# Patient Record
Sex: Male | Born: 1935 | Race: White | Hispanic: No | Smoking: Former smoker
Health system: Southern US, Community
[De-identification: ages and names within clinical notes are randomized; demographics above are authoritative.]

## PROBLEM LIST (undated history)

## (undated) DIAGNOSIS — J40 Bronchitis, not specified as acute or chronic: Secondary | ICD-10-CM

## (undated) DIAGNOSIS — C4490 Unspecified malignant neoplasm of skin, unspecified: Secondary | ICD-10-CM

## (undated) DIAGNOSIS — R351 Nocturia: Secondary | ICD-10-CM

## (undated) DIAGNOSIS — R972 Elevated prostate specific antigen [PSA]: Secondary | ICD-10-CM

## (undated) DIAGNOSIS — J8410 Pulmonary fibrosis, unspecified: Secondary | ICD-10-CM

## (undated) DIAGNOSIS — J45909 Unspecified asthma, uncomplicated: Secondary | ICD-10-CM

## (undated) DIAGNOSIS — J439 Emphysema, unspecified: Secondary | ICD-10-CM

## (undated) DIAGNOSIS — N400 Enlarged prostate without lower urinary tract symptoms: Secondary | ICD-10-CM

## (undated) DIAGNOSIS — I82409 Acute embolism and thrombosis of unspecified deep veins of unspecified lower extremity: Secondary | ICD-10-CM

## (undated) DIAGNOSIS — J449 Chronic obstructive pulmonary disease, unspecified: Secondary | ICD-10-CM

## (undated) DIAGNOSIS — C801 Malignant (primary) neoplasm, unspecified: Secondary | ICD-10-CM

## (undated) DIAGNOSIS — E785 Hyperlipidemia, unspecified: Secondary | ICD-10-CM

## (undated) HISTORY — DX: Elevated prostate specific antigen (PSA): R97.20

## (undated) HISTORY — PX: TONSILLECTOMY: SUR1361

## (undated) HISTORY — DX: Nocturia: R35.1

## (undated) HISTORY — DX: Benign prostatic hyperplasia without lower urinary tract symptoms: N40.0

## (undated) HISTORY — DX: Hyperlipidemia, unspecified: E78.5

## (undated) HISTORY — PX: HERNIA REPAIR: SHX51

## (undated) HISTORY — DX: Pulmonary fibrosis, unspecified: J84.10

## (undated) HISTORY — DX: Unspecified malignant neoplasm of skin, unspecified: C44.90

## (undated) HISTORY — DX: Chronic obstructive pulmonary disease, unspecified: J44.9

## (undated) HISTORY — PX: CATARACT EXTRACTION: SUR2

---

## 2004-09-02 ENCOUNTER — Emergency Department (HOSPITAL_COMMUNITY): Admission: EM | Admit: 2004-09-02 | Discharge: 2004-09-02 | Payer: Self-pay | Admitting: Emergency Medicine

## 2005-11-18 ENCOUNTER — Ambulatory Visit: Payer: Self-pay | Admitting: Internal Medicine

## 2007-11-22 ENCOUNTER — Emergency Department: Payer: Self-pay | Admitting: Emergency Medicine

## 2008-06-11 ENCOUNTER — Encounter: Payer: Self-pay | Admitting: Unknown Physician Specialty

## 2008-06-18 ENCOUNTER — Encounter: Payer: Self-pay | Admitting: Unknown Physician Specialty

## 2008-07-19 ENCOUNTER — Encounter: Payer: Self-pay | Admitting: Unknown Physician Specialty

## 2010-02-04 ENCOUNTER — Ambulatory Visit: Payer: Self-pay | Admitting: Internal Medicine

## 2010-07-27 ENCOUNTER — Inpatient Hospital Stay: Payer: Self-pay | Admitting: Internal Medicine

## 2011-06-24 ENCOUNTER — Ambulatory Visit: Payer: Self-pay

## 2012-04-12 ENCOUNTER — Ambulatory Visit: Payer: Self-pay | Admitting: Vascular Surgery

## 2012-04-12 LAB — BASIC METABOLIC PANEL
BUN: 15 mg/dL (ref 7–18)
Calcium, Total: 9.8 mg/dL (ref 8.5–10.1)
Chloride: 103 mmol/L (ref 98–107)
Co2: 29 mmol/L (ref 21–32)
Creatinine: 1.42 mg/dL — ABNORMAL HIGH (ref 0.60–1.30)
EGFR (Non-African Amer.): 45 — ABNORMAL LOW
Potassium: 4 mmol/L (ref 3.5–5.1)

## 2012-06-18 ENCOUNTER — Encounter: Payer: Self-pay | Admitting: Neurology

## 2012-07-19 ENCOUNTER — Encounter: Payer: Self-pay | Admitting: Neurology

## 2012-08-19 ENCOUNTER — Encounter: Payer: Self-pay | Admitting: Neurology

## 2012-09-18 ENCOUNTER — Encounter: Payer: Self-pay | Admitting: Neurology

## 2012-09-20 ENCOUNTER — Other Ambulatory Visit: Payer: Self-pay | Admitting: Neurology

## 2012-09-20 DIAGNOSIS — M531 Cervicobrachial syndrome: Secondary | ICD-10-CM

## 2012-09-20 DIAGNOSIS — R42 Dizziness and giddiness: Secondary | ICD-10-CM

## 2012-09-26 ENCOUNTER — Ambulatory Visit
Admission: RE | Admit: 2012-09-26 | Discharge: 2012-09-26 | Disposition: A | Discharge status: Home / Self Care | Payer: Medicare Other | Source: Ambulatory Visit | Attending: Neurology | Admitting: Neurology

## 2012-09-26 DIAGNOSIS — R42 Dizziness and giddiness: Secondary | ICD-10-CM

## 2012-09-26 DIAGNOSIS — M531 Cervicobrachial syndrome: Secondary | ICD-10-CM

## 2012-09-26 MED ORDER — GADOBENATE DIMEGLUMINE 529 MG/ML IV SOLN
18.0000 mL | Freq: Once | INTRAVENOUS | Status: AC | PRN
  Administered 2012-09-26: 18 mL via INTRAVENOUS

## 2012-12-12 ENCOUNTER — Emergency Department: Payer: Self-pay | Admitting: Emergency Medicine

## 2012-12-12 LAB — CBC WITH DIFFERENTIAL/PLATELET
Basophil #: 0.2 10*3/uL — ABNORMAL HIGH (ref 0.0–0.1)
Eosinophil #: 0.2 10*3/uL (ref 0.0–0.7)
HCT: 41.8 % (ref 40.0–52.0)
HGB: 13.3 g/dL (ref 13.0–18.0)
Lymphocyte %: 25.4 %
MCH: 27.5 pg (ref 26.0–34.0)
MCV: 86 fL (ref 80–100)
Neutrophil #: 7 10*3/uL — ABNORMAL HIGH (ref 1.4–6.5)
Neutrophil %: 61.5 %
RBC: 4.85 10*6/uL (ref 4.40–5.90)
WBC: 11.3 10*3/uL — ABNORMAL HIGH (ref 3.8–10.6)

## 2012-12-12 LAB — HEPATIC FUNCTION PANEL A (ARMC)
Albumin: 3.8 g/dL (ref 3.4–5.0)
Alkaline Phosphatase: 94 U/L (ref 50–136)
Bilirubin,Total: 0.5 mg/dL (ref 0.2–1.0)
SGOT(AST): 20 U/L (ref 15–37)
SGPT (ALT): 25 U/L (ref 12–78)
Total Protein: 7.8 g/dL (ref 6.4–8.2)

## 2012-12-12 LAB — RAPID INFLUENZA A&B ANTIGENS

## 2012-12-12 LAB — BASIC METABOLIC PANEL
EGFR (Non-African Amer.): 52 — ABNORMAL LOW
Glucose: 112 mg/dL — ABNORMAL HIGH (ref 65–99)
Potassium: 4 mmol/L (ref 3.5–5.1)
Sodium: 138 mmol/L (ref 136–145)

## 2013-08-13 ENCOUNTER — Ambulatory Visit: Payer: Self-pay | Admitting: Diagnostic Neuroimaging

## 2013-08-15 ENCOUNTER — Ambulatory Visit: Payer: Self-pay | Admitting: Internal Medicine

## 2013-12-31 ENCOUNTER — Ambulatory Visit: Payer: Self-pay | Admitting: Specialist

## 2014-06-11 DIAGNOSIS — R51 Headache: Secondary | ICD-10-CM

## 2014-06-11 DIAGNOSIS — J441 Chronic obstructive pulmonary disease with (acute) exacerbation: Secondary | ICD-10-CM | POA: Insufficient documentation

## 2014-06-11 DIAGNOSIS — I10 Essential (primary) hypertension: Secondary | ICD-10-CM | POA: Insufficient documentation

## 2014-06-11 DIAGNOSIS — J449 Chronic obstructive pulmonary disease, unspecified: Secondary | ICD-10-CM | POA: Insufficient documentation

## 2014-06-11 DIAGNOSIS — R519 Headache, unspecified: Secondary | ICD-10-CM | POA: Insufficient documentation

## 2014-12-04 DIAGNOSIS — I739 Peripheral vascular disease, unspecified: Secondary | ICD-10-CM

## 2014-12-04 DIAGNOSIS — I779 Disorder of arteries and arterioles, unspecified: Secondary | ICD-10-CM | POA: Insufficient documentation

## 2014-12-04 DIAGNOSIS — R413 Other amnesia: Secondary | ICD-10-CM | POA: Insufficient documentation

## 2014-12-04 DIAGNOSIS — E7800 Pure hypercholesterolemia, unspecified: Secondary | ICD-10-CM | POA: Insufficient documentation

## 2014-12-04 DIAGNOSIS — Z8709 Personal history of other diseases of the respiratory system: Secondary | ICD-10-CM | POA: Insufficient documentation

## 2014-12-04 DIAGNOSIS — Z8711 Personal history of peptic ulcer disease: Secondary | ICD-10-CM | POA: Insufficient documentation

## 2014-12-04 DIAGNOSIS — I200 Unstable angina: Secondary | ICD-10-CM | POA: Insufficient documentation

## 2014-12-04 DIAGNOSIS — K219 Gastro-esophageal reflux disease without esophagitis: Secondary | ICD-10-CM | POA: Insufficient documentation

## 2014-12-17 DIAGNOSIS — J45909 Unspecified asthma, uncomplicated: Secondary | ICD-10-CM | POA: Insufficient documentation

## 2015-02-19 ENCOUNTER — Ambulatory Visit: Payer: Self-pay | Admitting: Internal Medicine

## 2015-03-19 ENCOUNTER — Ambulatory Visit: Admit: 2015-03-19 | Disposition: A | Discharge status: Home / Self Care | Payer: Self-pay | Admitting: Internal Medicine

## 2015-04-12 NOTE — Op Note (Signed)
PATIENT NAME:  Wesley Newton, Wesley Newton MR#:  [id number] DATE OF BIRTH:  1927-06-13  DATE OF PROCEDURE:  04/12/2012  PREOPERATIVE DIAGNOSES:  1. Peripheral arterial disease with claudication, right lower extremity.  2. Hyperlipidemia.  3. Venous insufficiency.   POSTOPERATIVE DIAGNOSES:    1. Peripheral arterial disease with claudication, right lower extremity.  2. Hyperlipidemia.  3. Venous insufficiency.   PROCEDURES:    1. Catheter placement into right popliteal artery from left femoral approach.  2. Aortogram with selective right lower extremity angiogram.  3. Percutaneous transluminal angioplasty of right superficial femoral artery with 6 mm diameter angioplasty balloon.   SURGEON: Algernon Huxley, M.D.   ANESTHESIA: Local with moderate conscious sedation.   ESTIMATED BLOOD LOSS: 25 mL. FLUOROSCOPY TIME: 2.9 minutes.  CONTRAST USED: 50 mL.   INDICATION FOR PROCEDURE: This is an 81 year old white male with lifestyle limiting claudication of the right lower extremity. He had a noninvasive study that suggested SFA stenosis and moderately reduced ABIs. He desired intervention for lifestyle limitations. The risks and benefits were discussed. Informed consent was obtained.   DESCRIPTION OF PROCEDURE: The patient was brought to the vascular interventional radiology suite. Groins were shaved and prepped and a sterile surgical field was created. The left femoral head was localized with fluoroscopy. The left femoral artery was accessed without difficulty with a Seldinger needle. Three J-wire and 5 French sheath were placed. Pigtail catheter was placed at the L1 level and AP aortogram was performed which showed normal flow within the renal vessels bilaterally which were single. His aorta was densely calcified, but not heavily stenotic and the iliacs were patent without significant stenosis. I then hooked the aortic bifurcation and advanced to the right femoral head. Selective right lower extremity  angiogram was then performed. This demonstrated normal common femoral and profunda femoris artery. The superficial femoral artery and the mid to distal segment had a near occlusive stenosis. Below this, the popliteal artery was patent and there was two-vessel runoff distally with stenosis of the posterior tibial artery at the level of the foot. The patient was given 3,500 units of intravenous heparin with systemic anticoagulation. A 6 French Ansel sheath was placed over a Terumo advantage wire. I crossed the lesion without difficulty with a Terumo advantage wire and a Kumpe catheter and confirmed intraluminal flow in the popliteal artery. I then replaced the wire and performed percutaneous transluminal angioplasty of the stenotic area with a 6 mm diameter x 6 cm length balloon. A waste was taken which resolved. Completion angiogram following this showed this to be widely patent without evidence of residual stenosis. I elected to terminate the procedure. The sheath was pulled back to the ipsilateral external iliac artery and oblique arteriogram was performed. StarClose closure device was deployed in the usual fashion with excellent hemostatic result. The patient tolerated the procedure well and was taken to the recovery room in stable condition.   ____________________________ Algernon Huxley, MD jsd:ap D: 04/12/2012 09:00:55 ET T: 04/12/2012 11:53:21 ET JOB#: [id number]  cc: Algernon Huxley, MD, <Dictator> John B. Sarina Ser, MD Algernon Huxley MD ELECTRONICALLY SIGNED 04/16/2012 16:37

## 2015-08-02 ENCOUNTER — Emergency Department
Admission: EM | Admit: 2015-08-02 | Discharge: 2015-08-02 | Disposition: A | Discharge status: Home / Self Care | Payer: Medicare Other | Attending: Emergency Medicine | Admitting: Emergency Medicine

## 2015-08-02 ENCOUNTER — Encounter: Payer: Self-pay | Admitting: Emergency Medicine

## 2015-08-02 DIAGNOSIS — R42 Dizziness and giddiness: Secondary | ICD-10-CM | POA: Insufficient documentation

## 2015-08-02 DIAGNOSIS — T404X5A Adverse effect of other synthetic narcotics, initial encounter: Secondary | ICD-10-CM | POA: Insufficient documentation

## 2015-08-02 DIAGNOSIS — T50905A Adverse effect of unspecified drugs, medicaments and biological substances, initial encounter: Secondary | ICD-10-CM

## 2015-08-02 DIAGNOSIS — Z87891 Personal history of nicotine dependence: Secondary | ICD-10-CM | POA: Insufficient documentation

## 2015-08-02 DIAGNOSIS — T360X5A Adverse effect of penicillins, initial encounter: Secondary | ICD-10-CM | POA: Insufficient documentation

## 2015-08-02 DIAGNOSIS — R112 Nausea with vomiting, unspecified: Secondary | ICD-10-CM | POA: Insufficient documentation

## 2015-08-02 HISTORY — DX: Malignant (primary) neoplasm, unspecified: C80.1

## 2015-08-02 HISTORY — DX: Acute embolism and thrombosis of unspecified deep veins of unspecified lower extremity: I82.409

## 2015-08-02 LAB — COMPREHENSIVE METABOLIC PANEL
ALBUMIN: 4.1 g/dL (ref 3.5–5.0)
ALT: 16 U/L — AB (ref 17–63)
ANION GAP: 9 (ref 5–15)
AST: 25 U/L (ref 15–41)
Alkaline Phosphatase: 65 U/L (ref 38–126)
BUN: 15 mg/dL (ref 6–20)
CALCIUM: 9.1 mg/dL (ref 8.9–10.3)
CHLORIDE: 103 mmol/L (ref 101–111)
CO2: 25 mmol/L (ref 22–32)
Creatinine, Ser: 1.13 mg/dL (ref 0.61–1.24)
GFR calc non Af Amer: 57 mL/min — ABNORMAL LOW (ref >60)
Glucose, Bld: 139 mg/dL — ABNORMAL HIGH (ref 65–99)
POTASSIUM: 4.1 mmol/L (ref 3.5–5.1)
Sodium: 137 mmol/L (ref 135–145)
Total Bilirubin: 0.7 mg/dL (ref 0.3–1.2)
Total Protein: 7.6 g/dL (ref 6.5–8.1)

## 2015-08-02 LAB — CBC
HEMATOCRIT: 43.9 % (ref 40.0–52.0)
HEMOGLOBIN: 14.1 g/dL (ref 13.0–18.0)
MCH: 27.7 pg (ref 26.0–34.0)
MCHC: 32.1 g/dL (ref 32.0–36.0)
MCV: 86.1 fL (ref 80.0–100.0)
PLATELETS: 263 10*3/uL (ref 150–440)
RBC: 5.1 MIL/uL (ref 4.40–5.90)
RDW: 14.6 % — ABNORMAL HIGH (ref 11.5–14.5)
WBC: 10.2 10*3/uL (ref 3.8–10.6)

## 2015-08-02 LAB — TROPONIN I

## 2015-08-02 MED ORDER — SODIUM CHLORIDE 0.9 % IV BOLUS (SEPSIS)
500.0000 mL | Freq: Once | INTRAVENOUS | Status: AC
  Administered 2015-08-02: 500 mL via INTRAVENOUS

## 2015-08-02 MED ORDER — ONDANSETRON HCL 4 MG/2ML IJ SOLN
4.0000 mg | Freq: Once | INTRAMUSCULAR | Status: AC | PRN
  Administered 2015-08-02: 4 mg via INTRAVENOUS

## 2015-08-02 MED ORDER — ONDANSETRON HCL 4 MG PO TABS: 4.0000 mg | ORAL_TABLET | Freq: Three times a day (TID) | ORAL | Status: DC | PRN

## 2015-08-02 MED ORDER — ONDANSETRON HCL 4 MG/2ML IJ SOLN
INTRAMUSCULAR | Status: AC
  Filled 2015-08-02: qty 2

## 2015-08-02 NOTE — Discharge Instructions (Signed)
Drug Toxicity You are having a reaction to your medicine. This does not mean you are allergic to the drug. Medicines can have many different side effects and toxic reactions. These include:  Stomach symptoms, such as nausea, vomiting, cramps, diarrhea, bloating, constipation, and dry mouth.  Nervous system symptoms, such as weakness, muscle spasms, drowsiness, confusion, agitation, and headache.  Heart and blood vessel symptoms, such as fainting, irregular heartbeat (palpitations), and fast heartbeat.  Skin symptoms, such as itching, light sensitivity, and rashes. When taking more medicines, there is an increased chance of a drug interaction that can make you sick. Take your medicines as your caregiver recommends. Keep a list of the names and doses of each of your drugs. Avoid alcohol and street drugs. Call your caregiver if you are worried about drug side effects or toxicity. Document Released: 12/05/2005 Document Revised: 02/27/2012 Document Reviewed: 05/22/2007 [facility] Patient Information 2015 [place], [place]. This information is not intended to replace advice given to you by your health care provider. Make sure you discuss any questions you have with your health care provider.  Please return immediately if condition worsens. Please contact her primary physician or the physician you were given for referral. If you have any specialist physicians involved in her treatment and plan please also contact them. Thank you for using [facility].

## 2015-08-02 NOTE — ED Provider Notes (Signed)
Time Seen: Approximately 1443  I have reviewed the triage notes  Chief Complaint: Dizziness and Emesis   History of Present Illness: Wesley Newton is a 81 y.o. male who presents with feelings of "dizziness". Further investigation shows it is felt somewhat lightheaded after taking tramadol and amoxicillin. States he was prescribed these medications for some dental pain. Patient states in the history and discussion with his family does not tolerate narcotic medication well. She felt fine prior to taking the tramadol and then developed some nausea vomiting and feelings of lightheadedness. He denies any actual fall. He denies any headaches or any focal neurologic deficits. He denies any hematemesis or biliary emesis. He describes some mild generalized weakness but nonfocal. He denies any obvious vertiginous symptoms. He denies any chest pain or shortness of breath to this historian but states that he " aches all over." He denies any shortness of breath or fever.   Past Medical History  Diagnosis Date  . Cancer   . DVT of leg (deep venous thrombosis)     There are no active problems to display for this patient.   History reviewed. No pertinent past surgical history.  History reviewed. No pertinent past surgical history.  Current Outpatient Rx  Name  Route  Sig  Dispense  Refill  . ondansetron (ZOFRAN) 4 MG tablet   Oral   Take 1 tablet (4 mg total) by mouth every 8 (eight) hours as needed for nausea or vomiting.   12 tablet   1     Allergies:  Other  Family History: No family history on file.  Social History: Social History  Substance Use Topics  . Smoking status: Former Research scientist (life sciences)  . Smokeless tobacco: None  . Alcohol Use: No     Review of Systems:   10 point review of systems was performed and was otherwise negative:  Constitutional: No fever Eyes: No visual disturbances ENT: No sore throat, ear pain Cardiac: No chest pain Respiratory: No shortness of breath,  wheezing, or stridor Abdomen: No abdominal pain, no vomiting, No diarrhea Endocrine: No weight loss, No night sweats Extremities: No peripheral edema, cyanosis Skin: No rashes, easy bruising Neurologic: No focal weakness, trouble with speech or swollowing Urologic: No dysuria, Hematuria, or urinary frequency   Physical Exam:  ED Triage Vitals  Enc Vitals Group     BP 08/02/15 1354 171/74 mmHg     Pulse Rate 08/02/15 1354 79     Resp 08/02/15 1354 18     Temp 08/02/15 1354 98 F (36.7 C)     Temp Source 08/02/15 1354 Oral     SpO2 08/02/15 1354 96 %     Weight 08/02/15 1354 132 lb (59.875 kg)     Height 08/02/15 1354 5\' 6"  (1.676 m)     Head Cir --      Peak Flow --      Pain Score 08/02/15 1355 3     Pain Loc --      Pain Edu? --      Excl. in [place]? --     General: Awake , Alert , and Oriented times 3; GCS 15 Head: Normal cephalic , atraumatic Eyes: Pupils equal , round, reactive to light Nose/Throat: No nasal drainage, patent upper airway without erythema or exudate.  Neck: Supple, Full range of motion, No anterior adenopathy or palpable thyroid masses Lungs: Clear to ascultation without wheezes , rhonchi, or rales Heart: Regular rate, regular rhythm without murmurs , gallops , or  rubs Abdomen: Soft, non tender without rebound, guarding , or rigidity; bowel sounds positive and symmetric in all 4 quadrants. No organomegaly .        Extremities: 2 plus symmetric pulses. No edema, clubbing or cyanosis Neurologic: normal ambulation, Motor symmetric without deficits, sensory intact. Negative drift or cerebellar signs. No nystagmus Skin: warm, dry, no rashes   Labs:   All laboratory work was reviewed including any pertinent negatives or positives listed below:  Labs Reviewed  CBC - Abnormal; Notable for the following:    RDW 14.6 (*)    All other components within normal limits  COMPREHENSIVE METABOLIC PANEL - Abnormal; Notable for the following:    Glucose, Bld 139 (*)     ALT 16 (*)    GFR calc non Af Amer 57 (*)    All other components within normal limits  TROPONIN I    EKG: ED ECG REPORT I, Daymon Larsen, the attending physician, personally viewed and interpreted this ECG.  Date: 08/02/2015 EKG Time: 1357 Rate: 80 Rhythm: normal sinus rhythm QRS Axis: normal Intervals: normal ST/T Wave abnormalities: normal Conduction Disutrbances: none Narrative Interpretation: unremarkable Mild criteria for left ventricular hypertrophy No ischemic changes   Radiology: None I personally reviewed the radiologic studies   Procedures: None   Critical Care: None    ED Course: Patient received oral antibiotic therapy was able tolerate fluids here in emergency department and felt symptomatically improved Patient was ambulatory here in emergency department unassisted, felt symptomatically improved and safe to be discharged to his assisted living area. He denies any chest pain or discomfort to this historian. His workup for cardiac related issues otherwise is negative.   Assessment: Dizziness likely secondary to lightheadedness or near syncope. Likely adverse effect from his medication.  Final Clinical Impression:  Final diagnoses:  Medication adverse effect, initial encounter     Plan: Patient was advised to return immediately if condition worsens. Patient was advised to follow up with her primary care physician or other specialized physicians involved and in their current assessment. Patient was advised to take over-the-counter Motrin or Tylenol for pain. He should continue the oral antibiotic. Doesn't appear to have any facial abscess or trismus.             Daymon Larsen, MD 08/02/15 2115

## 2015-08-02 NOTE — ED Notes (Signed)
Pt eating applesauce, saltines and drinking ginger ale

## 2015-08-02 NOTE — ED Notes (Signed)
Pt to ED with c/o dizziness since yesterday with n,v since this am, states he was seen at [facility] yesterday for tooth infection and placed on Amoxicillin and Tramadol, states he feels "heaviness all over and holds chest when c/o heaviness"

## 2015-09-03 ENCOUNTER — Emergency Department: Payer: Medicare Other

## 2015-09-03 ENCOUNTER — Emergency Department
Admission: EM | Admit: 2015-09-03 | Discharge: 2015-09-03 | Disposition: A | Discharge status: Home / Self Care | Payer: Medicare Other | Attending: Emergency Medicine | Admitting: Emergency Medicine

## 2015-09-03 DIAGNOSIS — Z87891 Personal history of nicotine dependence: Secondary | ICD-10-CM | POA: Insufficient documentation

## 2015-09-03 DIAGNOSIS — Z7982 Long term (current) use of aspirin: Secondary | ICD-10-CM | POA: Insufficient documentation

## 2015-09-03 DIAGNOSIS — Z79899 Other long term (current) drug therapy: Secondary | ICD-10-CM | POA: Insufficient documentation

## 2015-09-03 DIAGNOSIS — Z7952 Long term (current) use of systemic steroids: Secondary | ICD-10-CM | POA: Insufficient documentation

## 2015-09-03 DIAGNOSIS — J441 Chronic obstructive pulmonary disease with (acute) exacerbation: Secondary | ICD-10-CM | POA: Insufficient documentation

## 2015-09-03 DIAGNOSIS — R05 Cough: Secondary | ICD-10-CM | POA: Diagnosis present

## 2015-09-03 HISTORY — DX: Emphysema, unspecified: J43.9

## 2015-09-03 LAB — BASIC METABOLIC PANEL
ANION GAP: 5 (ref 5–15)
BUN: 17 mg/dL (ref 6–20)
CO2: 30 mmol/L (ref 22–32)
CREATININE: 1.16 mg/dL (ref 0.61–1.24)
Calcium: 9.5 mg/dL (ref 8.9–10.3)
Chloride: 106 mmol/L (ref 101–111)
GFR, EST NON AFRICAN AMERICAN: 55 mL/min — AB (ref >60)
Glucose, Bld: 127 mg/dL — ABNORMAL HIGH (ref 65–99)
Potassium: 4.4 mmol/L (ref 3.5–5.1)
Sodium: 141 mmol/L (ref 135–145)

## 2015-09-03 LAB — CBC
HEMATOCRIT: 44.5 % (ref 40.0–52.0)
Hemoglobin: 14.6 g/dL (ref 13.0–18.0)
MCH: 28.1 pg (ref 26.0–34.0)
MCHC: 32.8 g/dL (ref 32.0–36.0)
MCV: 85.8 fL (ref 80.0–100.0)
PLATELETS: 264 10*3/uL (ref 150–440)
RBC: 5.19 MIL/uL (ref 4.40–5.90)
RDW: 14.8 % — AB (ref 11.5–14.5)
WBC: 10 10*3/uL (ref 3.8–10.6)

## 2015-09-03 LAB — PROTIME-INR
INR: 0.98
PROTHROMBIN TIME: 13.2 s (ref 11.4–15.0)

## 2015-09-03 LAB — TROPONIN I: Troponin I: <0.03 ng/mL (ref <0.031)

## 2015-09-03 LAB — APTT: aPTT: 31 seconds (ref 24–36)

## 2015-09-03 MED ORDER — PREDNISONE 20 MG PO TABS
60.0000 mg | ORAL_TABLET | Freq: Once | ORAL | Status: AC
  Administered 2015-09-03: 60 mg via ORAL
  Filled 2015-09-03: qty 3

## 2015-09-03 MED ORDER — PREDNISONE 20 MG PO TABS: 60.0000 mg | ORAL_TABLET | Freq: Every day | ORAL | Status: AC

## 2015-09-03 MED ORDER — AZITHROMYCIN 250 MG PO TABS: ORAL_TABLET | ORAL | Status: AC

## 2015-09-03 NOTE — ED Notes (Signed)
Pt c/o constant cough since this morning with hx of emphysema, pt is in NAD on arrival, VSS

## 2015-09-03 NOTE — ED Provider Notes (Signed)
[facility] Emergency Department Provider Note  ____________________________________________  Time seen: Approximately 6:43 PM  I have reviewed the triage vital signs and the nursing notes.   HISTORY  Chief Complaint Cough    HPI TYRE BEAVER is a 81 y.o. male with a history of COPD, not on O2, remote history of DVT not currently anticoagulated, presenting with cough. Patient reports that since this morning he has had increased cough from his baseline, one episode of green sputum production. He has tried his albuterol MDI without improvement. He also reports a one-month history of right calf pain without associated lower extremity edema greater than usual. No fever, chills, nausea, vomiting, abdominal pain, known sick contacts. Patient has not had a flu shot but denies myalgias.   Past Medical History  Diagnosis Date  . Cancer   . DVT of leg (deep venous thrombosis)   . Emphysema lung     There are no active problems to display for this patient.   Past Surgical History  Procedure Laterality Date  . Cataract extraction    . Hernia repair    . Tonsillectomy      Current Outpatient Rx  Name  Route  Sig  Dispense  Refill  . aspirin EC 81 MG tablet   Oral   Take 1 tablet by mouth daily.         . budesonide-formoterol (SYMBICORT) 160-4.5 MCG/ACT inhaler   Inhalation   Inhale 2 puffs into the lungs 2 (two) times daily.         . Cyanocobalamin (RA VITAMIN B-12 TR) 1000 MCG TBCR   Oral   Take 1 tablet by mouth daily.         . finasteride (PROSCAR) 5 MG tablet   Oral   Take 1 tablet by mouth daily.      1   . Magnesium 250 MG TABS   Oral   Take 2 tablets by mouth 2 (two) times daily.         . naproxen (NAPROSYN) 375 MG tablet   Oral   Take 1 tablet by mouth 3 (three) times daily as needed.      2   . omeprazole (PRILOSEC) 40 MG capsule   Oral   Take 1 capsule by mouth daily.      4   . simvastatin (ZOCOR) 40 MG  tablet   Oral   Take 1 tablet by mouth daily.      0   . tamsulosin (FLOMAX) 0.4 MG CAPS capsule   Oral   Take 1 capsule by mouth daily.      1   . VENTOLIN HFA 108 (90 BASE) MCG/ACT inhaler   Inhalation   Inhale 1 puff into the lungs every 6 (six) hours as needed.      10     Dispense as written.   Marland Kitchen azithromycin (ZITHROMAX Z-PAK) 250 MG tablet      Take 2 tablets (500 mg) on  Day 1,  followed by 1 tablet (250 mg) once daily on Days 2 through 5.   6 each   0   . ondansetron (ZOFRAN) 4 MG tablet   Oral   Take 1 tablet (4 mg total) by mouth every 8 (eight) hours as needed for nausea or vomiting.   12 tablet   1   . predniSONE (DELTASONE) 20 MG tablet   Oral   Take 3 tablets (60 mg total) by mouth daily.   15 tablet  0     Allergies Tramadol and Other  No family history on file.  Social History Social History  Substance Use Topics  . Smoking status: Former Research scientist (life sciences)  . Smokeless tobacco: None  . Alcohol Use: No    Review of Systems Constitutional: No fever/chills Eyes: No visual changes. ENT: No sore throat. Cardiovascular: Denies chest pain, palpitations. Respiratory: Increased cough from baseline, mild shortness of breath. Gastrointestinal: No abdominal pain.  No nausea, no vomiting.  No diarrhea.  No constipation. Genitourinary: Negative for dysuria. Musculoskeletal: Negative for back pain. Skin: Negative for rash. Neurological: Negative for headaches, focal weakness or numbness. 10-point ROS otherwise negative.  ____________________________________________   PHYSICAL EXAM:  VITAL SIGNS: ED Triage Vitals  Enc Vitals Group     BP 09/03/15 1509 135/64 mmHg     Pulse Rate 09/03/15 1509 102     Resp 09/03/15 1509 18     Temp 09/03/15 1509 98.3 F (36.8 C)     Temp Source 09/03/15 1509 Oral     SpO2 09/03/15 1509 95 %     Weight 09/03/15 1509 181 lb (82.101 kg)     Height 09/03/15 1509 5\' 6"  (1.676 m)     Head Cir --      Peak Flow --       Pain Score --      Pain Loc --      Pain Edu? --      Excl. in [place]? --     Constitutional: Alert and oriented. Well appearing and in no acute distress with intermittent coughing episodes.. Answer question appropriately. Eyes: Conjunctivae are normal.  EOMI. Head: Atraumatic. Nose: No congestion/rhinnorhea. Mouth/Throat: Mucous membranes are moist. No posterior pharyngeal erythema, tonsillar swelling or exudate. Ears; right TM is obscured by cerumen. Left TM is clear without bulging erythema or fluid. Neck: No stridor.  Supple.   Cardiovascular: Normal rate, regular rhythm. No murmurs, rubs or gallops.  Respiratory: Normal respiratory effort.  No retractions. Mild rales in the bases bilaterally left greater than right. No wheezing. No respiratory distress. Gastrointestinal: Soft and nontender. No distention. No peritoneal signs. Musculoskeletal: Bilateral symmetric mild pitting edema. Plus tenderness to palpation in the right calf. No palpable cords or Homans sign. Neurologic:  Normal speech and language. No gross focal neurologic deficits are appreciated.  Skin:  Skin is warm, dry and intact. No rash noted. Psychiatric: Mood and affect are normal. Speech and behavior are normal.  Normal judgement.  ____________________________________________   LABS (all labs ordered are listed, but only abnormal results are displayed)  Labs Reviewed  CBC - Abnormal; Notable for the following:    RDW 14.8 (*)    All other components within normal limits  BASIC METABOLIC PANEL - Abnormal; Notable for the following:    Glucose, Bld 127 (*)    GFR calc non Af Amer 55 (*)    All other components within normal limits  APTT  PROTIME-INR  TROPONIN I   ____________________________________________  EKG   ED ECG REPORT I, Eula Listen, the attending physician, personally viewed and interpreted this ECG.  Date: 09/03/2015 EKG Time: 18:58 Rate: 83 Rhythm: normal sinus rhythm QRS Axis:  leftward Intervals: normal ST/T Wave abnormalities: normal Conduction Disutrbances: none Narrative Interpretation: unremarkable  ____________________________________________  RADIOLOGY  Dg Chest 2 View  09/03/2015   CLINICAL DATA:  Dizziness, shortness of breath, cough this morning.  EXAM: CHEST  2 VIEW  COMPARISON:  CT 12/31/2013  FINDINGS: Stable interstitial prominence and fibrosis in  the lungs, particularly the lung bases. No acute airspace opacities. No effusions. Heart is normal size. Mediastinal contours within normal limits. No acute bony abnormality. Aortic atherosclerosis noted.  IMPRESSION: Stable changes of chronic lung disease and fibrosis. No acute findings.   Electronically Signed   By: Rolm Baptise M.D.   On: 09/03/2015 15:49   US Venous Img Lower Unilateral Right  09/03/2015   CLINICAL DATA:  Right lower extremity pain.  EXAM: Right LOWER EXTREMITY VENOUS DOPPLER ULTRASOUND  TECHNIQUE: Gray-scale sonography with graded compression, as well as color Doppler and duplex ultrasound were performed to evaluate the lower extremity deep venous systems from the level of the common femoral vein and including the common femoral, femoral, profunda femoral, popliteal and calf veins including the posterior tibial, peroneal and gastrocnemius veins when visible. The superficial great saphenous vein was also interrogated. Spectral Doppler was utilized to evaluate flow at rest and with distal augmentation maneuvers in the common femoral, femoral and popliteal veins.  COMPARISON:  None.  FINDINGS: Contralateral Common Femoral Vein: Respiratory phasicity is normal and symmetric with the symptomatic side. No evidence of thrombus. Normal compressibility.  Common Femoral Vein: No evidence of thrombus. Normal compressibility, respiratory phasicity and response to augmentation.  Saphenofemoral Junction: No evidence of thrombus. Normal compressibility and flow on color Doppler imaging.  Profunda Femoral Vein:  No evidence of thrombus. Normal compressibility and flow on color Doppler imaging.  Femoral Vein: No evidence of thrombus. Normal compressibility, respiratory phasicity and response to augmentation.  Popliteal Vein: No evidence of thrombus. Normal compressibility, respiratory phasicity and response to augmentation.  Calf Veins: No evidence of thrombus. Normal compressibility and flow on color Doppler imaging.  Superficial Great Saphenous Vein: No evidence of thrombus. Normal compressibility and flow on color Doppler imaging.  Venous Reflux:  None.  Other Findings:  None.  IMPRESSION: No evidence of deep venous thrombosis.   Electronically Signed   By: Marin Olp M.D.   On: 09/03/2015 19:45    ____________________________________________   PROCEDURES  Procedure(s) performed: None  Critical Care performed: No ____________________________________________   INITIAL IMPRESSION / ASSESSMENT AND PLAN / ED COURSE  Pertinent labs & imaging results that were available during my care of the patient were reviewed by me and considered in my medical decision making (see chart for details).  81 year old male with a history of COPD, remote history of DVT not anticoagulated, presenting with cough 1 day, mild associated shortness of breath and physical exam notable for Rales in the bases bilaterally. The patient also has right calf pain and a history of DVT. Plan to rule out reluctantly DVT. Patient may have COPD exacerbation, consider early pneumonia with radiographic lag given onset of symptoms today. New onset of ACS/MI is less likely but will get a screening EKG and a troponin. Plan to initiate steroids. Will reevaluate.  ----------------------------------------- 7:45 PM on 09/03/2015 -----------------------------------------  The patient has a reassuring EKG and negative troponin. His white blood cell count is also normal. He has no acute findings on his chest x-ray. I'm awaiting the ultrasound of  his right lower extremity, but if it is negative I will anticipate discharge home with treatment for acute COPD flare.  ----------------------------------------- 8:18 PM on 09/03/2015 -----------------------------------------  The patient is feeling better. He has a normal chest x-ray, no elevation in his white was a cow and lower extremity Dopplers negative for DVT in the right leg. I'll plan to discharge him with steroids, and antibiotics. He will follow up with either  his pulmonologist, Dr. Raul Del, or his primary care physician. He and his daughter both understand and agree with the plan and understand return precautions.  ____________________________________________  FINAL CLINICAL IMPRESSION(S) / ED DIAGNOSES  Final diagnoses:  COPD with acute exacerbation      NEW MEDICATIONS STARTED DURING THIS VISIT:  New Prescriptions   AZITHROMYCIN (ZITHROMAX Z-PAK) 250 MG TABLET    Take 2 tablets (500 mg) on  Day 1,  followed by 1 tablet (250 mg) once daily on Days 2 through 5.   PREDNISONE (DELTASONE) 20 MG TABLET    Take 3 tablets (60 mg total) by mouth daily.     Eula Listen, MD 09/03/15 2018

## 2015-09-03 NOTE — ED Notes (Signed)
Pt maintained SPO2 of 91-92% while ambulating on RA. Pt demonstrated labored breathing with in RR. Pt also became unbalanced and stated he felt "wobbly and sob". Pt required standby assistance to ambulate back to room.

## 2015-09-03 NOTE — Discharge Instructions (Signed)
Please continue to use all your COPD medications as prescribed.  You may continue to use your rescue inhaler, with 1-2 puffs every 2-4 hours as needed for cough or shortness of breath.  Please return to the emergency department for severe pain, shortness of breath, chest pain, fever, or any other symptoms concerning to you.

## 2015-09-28 ENCOUNTER — Other Ambulatory Visit: Payer: Self-pay

## 2015-09-28 DIAGNOSIS — N400 Enlarged prostate without lower urinary tract symptoms: Secondary | ICD-10-CM

## 2015-09-28 MED ORDER — FINASTERIDE 5 MG PO TABS: 5.0000 mg | ORAL_TABLET | Freq: Every day | ORAL | Status: DC

## 2015-12-25 ENCOUNTER — Encounter: Payer: Self-pay | Admitting: *Deleted

## 2015-12-30 ENCOUNTER — Ambulatory Visit: Payer: Self-pay | Admitting: Urology

## 2015-12-31 ENCOUNTER — Emergency Department
Admission: EM | Admit: 2015-12-31 | Discharge: 2015-12-31 | Disposition: A | Discharge status: Home / Self Care | Payer: Medicare Other | Attending: Emergency Medicine | Admitting: Emergency Medicine

## 2015-12-31 ENCOUNTER — Emergency Department: Payer: Medicare Other

## 2015-12-31 ENCOUNTER — Encounter: Payer: Self-pay | Admitting: Emergency Medicine

## 2015-12-31 DIAGNOSIS — Z7982 Long term (current) use of aspirin: Secondary | ICD-10-CM | POA: Insufficient documentation

## 2015-12-31 DIAGNOSIS — J441 Chronic obstructive pulmonary disease with (acute) exacerbation: Secondary | ICD-10-CM | POA: Diagnosis not present

## 2015-12-31 DIAGNOSIS — J8410 Pulmonary fibrosis, unspecified: Secondary | ICD-10-CM

## 2015-12-31 DIAGNOSIS — Z7951 Long term (current) use of inhaled steroids: Secondary | ICD-10-CM | POA: Diagnosis not present

## 2015-12-31 DIAGNOSIS — Z87891 Personal history of nicotine dependence: Secondary | ICD-10-CM | POA: Diagnosis not present

## 2015-12-31 DIAGNOSIS — Z79899 Other long term (current) drug therapy: Secondary | ICD-10-CM | POA: Insufficient documentation

## 2015-12-31 DIAGNOSIS — R0602 Shortness of breath: Secondary | ICD-10-CM | POA: Diagnosis present

## 2015-12-31 LAB — BASIC METABOLIC PANEL
ANION GAP: 4 — AB (ref 5–15)
BUN: 18 mg/dL (ref 6–20)
CHLORIDE: 106 mmol/L (ref 101–111)
CO2: 27 mmol/L (ref 22–32)
Calcium: 9.4 mg/dL (ref 8.9–10.3)
Creatinine, Ser: 1.22 mg/dL (ref 0.61–1.24)
GFR calc Af Amer: >60 mL/min (ref >60)
GFR, EST NON AFRICAN AMERICAN: 52 mL/min — AB (ref >60)
GLUCOSE: 116 mg/dL — AB (ref 65–99)
POTASSIUM: 4.6 mmol/L (ref 3.5–5.1)
Sodium: 137 mmol/L (ref 135–145)

## 2015-12-31 LAB — CBC WITH DIFFERENTIAL/PLATELET
BASOS ABS: 0.1 10*3/uL (ref 0–0.1)
Basophils Relative: 1 %
Eosinophils Absolute: 0.1 10*3/uL (ref 0–0.7)
Eosinophils Relative: 0 %
HEMATOCRIT: 44.5 % (ref 40.0–52.0)
Hemoglobin: 14.3 g/dL (ref 13.0–18.0)
LYMPHS ABS: 3.9 10*3/uL — AB (ref 1.0–3.6)
LYMPHS PCT: 32 %
MCH: 28 pg (ref 26.0–34.0)
MCHC: 32 g/dL (ref 32.0–36.0)
MCV: 87.4 fL (ref 80.0–100.0)
Monocytes Absolute: 1.1 10*3/uL — ABNORMAL HIGH (ref 0.2–1.0)
Monocytes Relative: 9 %
NEUTROS PCT: 58 %
Neutro Abs: 7 10*3/uL — ABNORMAL HIGH (ref 1.4–6.5)
Platelets: 263 10*3/uL (ref 150–440)
RBC: 5.09 MIL/uL (ref 4.40–5.90)
RDW: 15.5 % — ABNORMAL HIGH (ref 11.5–14.5)
WBC: 12.2 10*3/uL — AB (ref 3.8–10.6)

## 2015-12-31 LAB — BRAIN NATRIURETIC PEPTIDE: B Natriuretic Peptide: 53 pg/mL (ref 0.0–100.0)

## 2015-12-31 MED ORDER — IPRATROPIUM-ALBUTEROL 0.5-2.5 (3) MG/3ML IN SOLN
3.0000 mL | Freq: Once | RESPIRATORY_TRACT | Status: AC
  Administered 2015-12-31: 3 mL via RESPIRATORY_TRACT

## 2015-12-31 MED ORDER — AZITHROMYCIN 250 MG PO TABS: ORAL_TABLET | ORAL | Status: DC

## 2015-12-31 MED ORDER — IPRATROPIUM-ALBUTEROL 0.5-2.5 (3) MG/3ML IN SOLN
RESPIRATORY_TRACT | Status: AC
  Administered 2015-12-31: 3 mL via RESPIRATORY_TRACT
  Filled 2015-12-31: qty 3

## 2015-12-31 MED ORDER — PREDNISONE 20 MG PO TABS: 40.0000 mg | ORAL_TABLET | Freq: Every day | ORAL | Status: AC

## 2015-12-31 MED ORDER — PREDNISONE 20 MG PO TABS
ORAL_TABLET | ORAL | Status: AC
  Administered 2015-12-31: 60 mg via ORAL
  Filled 2015-12-31: qty 3

## 2015-12-31 MED ORDER — PREDNISONE 20 MG PO TABS
60.0000 mg | ORAL_TABLET | Freq: Once | ORAL | Status: AC
  Administered 2015-12-31: 60 mg via ORAL

## 2015-12-31 NOTE — ED Provider Notes (Signed)
Time Seen: Approximately 1550  I have reviewed the triage notes  Chief Complaint: Shortness of Breath   History of Present Illness: Wesley Newton is a 82 y.o. male *who states he's had some mild increase in shortness of breath and a productive cough recently. Patient has a long history of pulmonary fibrosis and is on chronic intermittent inhalers at home. She generally uses a Ventolin inhaler for breakthrough shortness of breath. He has been on steroids before which are effective for him. He denies any fever and states his cough is been occasionally productive with yellow to green tinged sputum. He denies any chest pain, calf pain or leg swelling, he denies any recent pulmonary emboli risk factors.   Past Medical History  Diagnosis Date  . Cancer ([place])   . DVT of leg (deep venous thrombosis) ([place])   . Emphysema lung ([place])   . Elevated PSA   . BPH (benign prostatic hyperplasia)   . COPD (chronic obstructive pulmonary disease) ([place])   . Nocturia     There are no active problems to display for this patient.   Past Surgical History  Procedure Laterality Date  . Cataract extraction    . Hernia repair    . Tonsillectomy      Past Surgical History  Procedure Laterality Date  . Cataract extraction    . Hernia repair    . Tonsillectomy      Current Outpatient Rx  Name  Route  Sig  Dispense  Refill  . aspirin EC 81 MG tablet   Oral   Take 1 tablet by mouth daily.         Marland Kitchen azithromycin (ZITHROMAX Z-PAK) 250 MG tablet      Take 2 tablets (500 mg) on  Day 1,  followed by 1 tablet (250 mg) once daily on Days 2 through 5.   6 each   0   . budesonide-formoterol (SYMBICORT) 160-4.5 MCG/ACT inhaler   Inhalation   Inhale 2 puffs into the lungs 2 (two) times daily.         . Cyanocobalamin (RA VITAMIN B-12 TR) 1000 MCG TBCR   Oral   Take 1 tablet by mouth daily.         . finasteride (PROSCAR) 5 MG tablet   Oral   Take 1 tablet (5 mg total) by mouth daily.   30  tablet   3   . Magnesium 250 MG TABS   Oral   Take 2 tablets by mouth 2 (two) times daily.         . naproxen (NAPROSYN) 375 MG tablet   Oral   Take 1 tablet by mouth 3 (three) times daily as needed.      2   . omeprazole (PRILOSEC) 40 MG capsule   Oral   Take 1 capsule by mouth daily.      4   . ondansetron (ZOFRAN) 4 MG tablet   Oral   Take 1 tablet (4 mg total) by mouth every 8 (eight) hours as needed for nausea or vomiting.   12 tablet   1   . predniSONE (DELTASONE) 20 MG tablet   Oral   Take 2 tablets (40 mg total) by mouth daily.   10 tablet   0   . simvastatin (ZOCOR) 40 MG tablet   Oral   Take 1 tablet by mouth daily.      0   . tamsulosin (FLOMAX) 0.4 MG CAPS capsule   Oral  Take 1 capsule by mouth daily.      1   . VENTOLIN HFA 108 (90 BASE) MCG/ACT inhaler   Inhalation   Inhale 1 puff into the lungs every 6 (six) hours as needed.      10     Dispense as written.     Allergies:  Codeine; Rapaflo; Tramadol; and Other  Family History: Family History  Problem Relation Age of Onset  . Kidney disease Neg Hx   . Prostate cancer Neg Hx     Social History: Social History  Substance Use Topics  . Smoking status: Former Research scientist (life sciences)  . Smokeless tobacco: None  . Alcohol Use: No     Review of Systems:   10 point review of systems was performed and was otherwise negative:  Constitutional: No fever Eyes: No visual disturbances ENT: No sore throat, ear pain Cardiac: No chest pain Respiratory: Mild shortness of breath no wheezing, or stridor Abdomen: No abdominal pain, no vomiting, No diarrhea Endocrine: No weight loss, No night sweats Extremities: No peripheral edema, cyanosis Skin: No rashes, easy bruising Neurologic: No focal weakness, trouble with speech or swollowing Urologic: No dysuria, Hematuria, or urinary frequency   Physical Exam:  ED Triage Vitals  Enc Vitals Group     BP 12/31/15 1537 145/72 mmHg     Pulse Rate  12/31/15 1537 83     Resp 12/31/15 1537 22     Temp 12/31/15 1537 98.6 F (37 C)     Temp Source 12/31/15 1537 Tympanic     SpO2 12/31/15 1537 93 %     Weight 12/31/15 1538 180 lb (81.647 kg)     Height 12/31/15 1538 5\' 6"  (1.676 m)     Head Cir --      Peak Flow --      Pain Score --      Pain Loc --      Pain Edu? --      Excl. in [place]? --     General: Awake , Alert , and Oriented times 3; GCS 15 Head: Normal cephalic , atraumatic Eyes: Pupils equal , round, reactive to light Nose/Throat: No nasal drainage, patent upper airway without erythema or exudate.  Neck: Supple, Full range of motion, No anterior adenopathy or palpable thyroid masses Lungs: Patient has some mild and expiratory wheezing heard at the apices without rhonchi or rales Heart: Regular rate, regular rhythm without murmurs , gallops , or rubs Abdomen: Soft, non tender without rebound, guarding , or rigidity; bowel sounds positive and symmetric in all 4 quadrants. No organomegaly .        Extremities: 2 plus symmetric pulses. No edema, clubbing or cyanosis Neurologic: normal ambulation, Motor symmetric without deficits, sensory intact Skin: warm, dry, no rashes   Labs:   All laboratory work was reviewed including any pertinent negatives or positives listed below:  Labs Reviewed  CBC WITH DIFFERENTIAL/PLATELET - Abnormal; Notable for the following:    WBC 12.2 (*)    RDW 15.5 (*)    Neutro Abs 7.0 (*)    Lymphs Abs 3.9 (*)    Monocytes Absolute 1.1 (*)    All other components within normal limits  BASIC METABOLIC PANEL - Abnormal; Notable for the following:    Glucose, Bld 116 (*)    GFR calc non Af Amer 52 (*)    Anion gap 4 (*)    All other components within normal limits  BRAIN NATRIURETIC PEPTIDE   Review of laboratory  work showed no significant findings except for slightly elevated white blood cell count. EKG:  ED ECG REPORT I, Daymon Larsen, the attending physician, personally viewed and  interpreted this ECG.  Date: 12/31/2015 EKG Time: 1540 Rate: 89 Rhythm: normal sinus rhythm QRS Axis: normal Intervals: normal ST/T Wave abnormalities: normal Conduction Disutrbances: none Narrative Interpretation: unremarkable   Radiology:      EXAM: CHEST - 2 VIEW  COMPARISON: Two-view chest x-ray 09/03/2015  FINDINGS: The heart size is normal. Chronic fibrotic changes are similar to the prior exam. There is no significant superimposed airspace disease or edema. No effusions are present. Atherosclerotic changes are again noted at the aorta. The visualized soft tissues and bony thorax are unremarkable.  IMPRESSION: 1. No acute cardiopulmonary disease or significant interval change. 2. Chronic pulmonary fibrosis and interstitial coarsening. 3. Atherosclerosis of the thoracic aorta.   I personally reviewed the radiologic studies   P ED Course:  Patient was given a breathing treatment here in emergency department and stated some symptomatic relief. Not appear to have community-acquired pneumonia or reason for hospitalization at this time. Patient will be prescribed Zithromax on an outpatient basis was cautioned that this is to prevent pneumonia and not necessarily will relieve HIS symptoms. He's been advised to increase his rescue inhaler at home and talk to his primary physician about ongoing steroid inhaler. He'll be given a course of prednisone    Assessment:  Acute exacerbation of chronic pulmonary fibrosis   Final Clinical Impression:   Final diagnoses:  Pulmonary fibrosis ([place])     Plan: Outpatient management Patient was advised to return immediately if condition worsens. Patient was advised to follow up with their primary care physician or other specialized physicians involved in their outpatient care           Daymon Larsen, MD 12/31/15 2308

## 2015-12-31 NOTE — Discharge Instructions (Signed)
Pulmonary Fibrosis °Pulmonary fibrosis is a type of lung disease that causes scarring. Over time, the scar tissue (fibrosis) builds up in the air sacs of your lungs. This makes it hard for you to breathe. Less oxygen can get into your blood. °Scarring from pulmonary fibrosis gets worse over time. This damage is permanent. Having damaged lungs may make it more likely that you will have heart problems as well. °CAUSES °Usually, the cause of pulmonary fibrosis is not known (idiopathic pulmonary fibrosis). However, pulmonary fibrosis can be caused by:  °· Exposure to occupational and environmental toxins. These include asbestos, silica, and metal dusts. °· Inhaling moldy hay. This can cause an allergic reaction in the lung (farmer's lung) that can lead to pulmonary fibrosis. °· Inhaling toxic fumes. °· Certain medicines. These include drugs used in radiation therapy or used to treat seizures, heart problems, and some infections. °· Autoimmune diseases, such as rheumatoid arthritis, systemic sclerosis, and connective tissue diseases. °· Sarcoidosis. In this disease, areas of inflammatory cells (granulomas) form and most often affect the lungs. °· Genes. Some cases of pulmonary fibrosis may be passed down through families. °RISK FACTORS °You may be at a higher risk for developing pulmonary fibrosis if: °· You have a family history of the disease. °· You have an autoimmune disease or another condition linked to pulmonary fibrosis. °· You are exposed to certain substances or fumes found in agricultural, farm, construction, or factory work. °· You take certain medicines. °SIGNS AND SYMPTOMS °Symptoms may include: °· Difficulty breathing that gets worse with activity. °· Dry, hacking cough. °· Rapid, shallow breathing during exercise or while at rest. °· Shortness of breath that gets worse (dyspnea). °· Bluish skin and lips. °· Loss of appetite. °· Loss of strength. °· Weight loss and fatigue. °· Rounded and enlarged  fingertips (clubbing). °DIAGNOSIS °Your health care provider may suspect pulmonary fibrosis based on your symptoms and medical history. Diagnosis may include a physical exam. Your health care provider will check for signs that strongly suggest that you have pulmonary fibrosis, such as: °· Blue skin around your fingernails or mouth from reduced oxygen. °· Clubbing around the ends of your fingers. °· A crackling sound when you breathe. °Your health care provider may also do tests to confirm the diagnosis. These may include: °· Looking inside your lungs with an instrument (bronchoscopy). °· Imaging studies of your lungs and heart using: °¨ X-rays. °¨ CT scan. °¨ Sound waves (echocardiogram). °· Tests to measure how well you are breathing (pulmonary function tests). °· Exercise testing to see how well your lungs work while you are walking. °· Blood tests. °· A procedure to remove a small piece of lung tissue to examine in a lab (biopsy). °TREATMENT °There is no cure for pulmonary fibrosis. Treatments focus on managing symptoms and preventing scarring from getting worse. This can include: °· Medicines. °¨ You may take steroids to prevent permanent lung changes. Your health care provider may put you on a high dose at first, then on lower dosages for the long term. °¨ Medicines to suppress your body's defense (immune) system. These can have serious side effects. °· You may be monitored with X-rays and laboratory work. °· Oxygen therapy may be helpful if oxygen in your blood is low. °· Surgery. In some cases, a lung transplant is an option. °HOME CARE INSTRUCTIONS °· Take medicines only as directed by your health care provider. °· Keep your vaccinations up to date as recommended by your health care provider.  °·   Do not use any tobacco products, including cigarettes, chewing tobacco, or electronic cigarettes. If you need help quitting, ask your health care provider.  Get regular exercise, but do not overexert yourself. Ask  your health care provider to suggest some activities that are safe for you to do. Walking and chair exercises can help if you have physical limitations.  Consider joining a pulmonary rehabilitation program or a support group for people with pulmonary fibrosis.  Eat small meals often so you do not get too full. Overeating can make breathing trouble worse.  Maintain a healthy weight. Lose weight if you need to.  Do breathing exercises as directed by your health care provider.  Keep all follow-up visits as directed by your health care provider. This is important. SEEK MEDICAL CARE IF:  You are not able to be as active as usual.  You have a long-lasting (chronic) cough.  You are often short of breath.  You have a fever or chills. SEEK IMMEDIATE MEDICAL CARE IF:  You have chest pain.  Your breathing is much worse.  You cannot take a deep breath.  You have blue skin around your mouth or fingers.  You have clubbing of your fingers.  You cough up mucus that is dark in color.  You have a lot of headaches.  You get very confused or sleepy.   This information is not intended to replace advice given to you by your health care provider. Make sure you discuss any questions you have with your health care provider.   Document Released: 02/25/2004 Document Revised: 12/26/2014 Document Reviewed: 05/15/2014 Elsevier Interactive Patient Education [employer].  Please return immediately if condition worsens. Please contact her primary physician or the physician you were given for referral. If you have any specialist physicians involved in her treatment and plan please also contact them. Thank you for using [facility]. Please increase the use of your inhaler to every 4 hours as needed. Discussed with your primary physician on whether or not she would benefit with daily inhaled steroids. Return to the emergency department for fever, increasing productive cough,  increased wheezing, focal weakness, syncopal episode or any other new concerns.

## 2015-12-31 NOTE — ED Notes (Signed)
Pt presents with cough and shortness of breath for a couple of weeks. Pt is resident of [facility] and was sent over for further eval.

## 2016-01-05 ENCOUNTER — Emergency Department: Payer: Medicare Other

## 2016-01-05 ENCOUNTER — Encounter: Payer: Self-pay | Admitting: Emergency Medicine

## 2016-01-05 ENCOUNTER — Emergency Department
Admission: EM | Admit: 2016-01-05 | Discharge: 2016-01-05 | Disposition: A | Discharge status: Home / Self Care | Payer: Medicare Other | Attending: Emergency Medicine | Admitting: Emergency Medicine

## 2016-01-05 DIAGNOSIS — Z7982 Long term (current) use of aspirin: Secondary | ICD-10-CM | POA: Insufficient documentation

## 2016-01-05 DIAGNOSIS — R0600 Dyspnea, unspecified: Secondary | ICD-10-CM | POA: Diagnosis not present

## 2016-01-05 DIAGNOSIS — R05 Cough: Secondary | ICD-10-CM | POA: Insufficient documentation

## 2016-01-05 DIAGNOSIS — R0602 Shortness of breath: Secondary | ICD-10-CM | POA: Diagnosis present

## 2016-01-05 DIAGNOSIS — R509 Fever, unspecified: Secondary | ICD-10-CM | POA: Insufficient documentation

## 2016-01-05 DIAGNOSIS — Z79899 Other long term (current) drug therapy: Secondary | ICD-10-CM | POA: Diagnosis not present

## 2016-01-05 DIAGNOSIS — Z87891 Personal history of nicotine dependence: Secondary | ICD-10-CM | POA: Diagnosis not present

## 2016-01-05 DIAGNOSIS — J8410 Pulmonary fibrosis, unspecified: Secondary | ICD-10-CM | POA: Insufficient documentation

## 2016-01-05 LAB — CBC
HCT: 42.2 % (ref 40.0–52.0)
HEMOGLOBIN: 13.6 g/dL (ref 13.0–18.0)
MCH: 27.9 pg (ref 26.0–34.0)
MCHC: 32.3 g/dL (ref 32.0–36.0)
MCV: 86.2 fL (ref 80.0–100.0)
Platelets: 279 10*3/uL (ref 150–440)
RBC: 4.89 MIL/uL (ref 4.40–5.90)
RDW: 15 % — AB (ref 11.5–14.5)
WBC: 12.4 10*3/uL — ABNORMAL HIGH (ref 3.8–10.6)

## 2016-01-05 LAB — BASIC METABOLIC PANEL
Anion gap: 9 (ref 5–15)
BUN: 24 mg/dL — AB (ref 6–20)
CHLORIDE: 103 mmol/L (ref 101–111)
CO2: 24 mmol/L (ref 22–32)
CREATININE: 1.24 mg/dL (ref 0.61–1.24)
Calcium: 9.3 mg/dL (ref 8.9–10.3)
GFR calc Af Amer: 59 mL/min — ABNORMAL LOW (ref >60)
GFR calc non Af Amer: 51 mL/min — ABNORMAL LOW (ref >60)
GLUCOSE: 138 mg/dL — AB (ref 65–99)
POTASSIUM: 4 mmol/L (ref 3.5–5.1)
Sodium: 136 mmol/L (ref 135–145)

## 2016-01-05 LAB — FIBRIN DERIVATIVES D-DIMER (ARMC ONLY): Fibrin derivatives D-dimer (ARMC): 442 (ref 0–499)

## 2016-01-05 LAB — BRAIN NATRIURETIC PEPTIDE: B NATRIURETIC PEPTIDE 5: 150 pg/mL — AB (ref 0.0–100.0)

## 2016-01-05 LAB — TROPONIN I: TROPONIN I: 0.04 ng/mL — AB (ref <0.031)

## 2016-01-05 MED ORDER — IPRATROPIUM-ALBUTEROL 0.5-2.5 (3) MG/3ML IN SOLN
3.0000 mL | Freq: Once | RESPIRATORY_TRACT | Status: AC
  Administered 2016-01-05: 3 mL via RESPIRATORY_TRACT
  Filled 2016-01-05: qty 3

## 2016-01-05 MED ORDER — PREDNISONE 20 MG PO TABS
60.0000 mg | ORAL_TABLET | Freq: Once | ORAL | Status: AC
  Administered 2016-01-05: 60 mg via ORAL
  Filled 2016-01-05: qty 3

## 2016-01-05 NOTE — ED Notes (Signed)
18g PIV placed by EMS PTA removed from LEFT AC at DC. Site is clean, dry and intact.

## 2016-01-05 NOTE — Discharge Instructions (Signed)
Shortness of Breath Shortness of breath means you have trouble breathing. It could also mean that you have a medical problem. You should get immediate medical care for shortness of breath. CAUSES   Not enough oxygen in the air such as with high altitudes or a smoke-filled room.  Certain lung diseases, infections, or problems.  Heart disease or conditions, such as angina or heart failure.  Low red blood cells (anemia).  Poor physical fitness, which can cause shortness of breath when you exercise.  Chest or back injuries or stiffness.  Being overweight.  Smoking.  Anxiety, which can make you feel like you are not getting enough air. DIAGNOSIS  Serious medical problems can often be found during your physical exam. Tests may also be done to determine why you are having shortness of breath. Tests may include:  Chest X-rays.  Lung function tests.  Blood tests.  An electrocardiogram (ECG).  An ambulatory electrocardiogram. An ambulatory ECG records your heartbeat patterns over a 24-hour period.  Exercise testing.  A transthoracic echocardiogram (TTE). During echocardiography, sound waves are used to evaluate how blood flows through your heart.  A transesophageal echocardiogram (TEE).  Imaging scans. Your health care provider may not be able to find a cause for your shortness of breath after your exam. In this case, it is important to have a follow-up exam with your health care provider as directed.  TREATMENT  Treatment for shortness of breath depends on the cause of your symptoms and can vary greatly. HOME CARE INSTRUCTIONS   Do not smoke. Smoking is a common cause of shortness of breath. If you smoke, ask for help to quit.  Avoid being around chemicals or things that may bother your breathing, such as paint fumes and dust.  Rest as needed. Slowly resume your usual activities.  If medicines were prescribed, take them as directed for the full length of time directed. This  includes oxygen and any inhaled medicines.  Keep all follow-up appointments as directed by your health care provider. SEEK MEDICAL CARE IF:   Your condition does not improve in the time expected.  You have a hard time doing your normal activities even with rest.  You have any new symptoms. SEEK IMMEDIATE MEDICAL CARE IF:   Your shortness of breath gets worse.  You feel light-headed, faint, or develop a cough not controlled with medicines.  You start coughing up blood.  You have pain with breathing.  You have chest pain or pain in your arms, shoulders, or abdomen.  You have a fever.  You are unable to walk up stairs or exercise the way you normally do. MAKE SURE YOU:  Understand these instructions.  Will watch your condition.  Will get help right away if you are not doing well or get worse.   This information is not intended to replace advice given to you by your health care provider. Make sure you discuss any questions you have with your health care provider.   Document Released: 08/30/2001 Document Revised: 12/10/2013 Document Reviewed: 02/20/2012 Elsevier Interactive Patient Education [employer].  Please return immediately if condition worsens. Please contact her primary physician or the physician you were given for referral. If you have any specialist physicians involved in her treatment and plan please also contact them. Thank you for using [facility]. Please call your pulmonologist in the morning for follow-up as directed. Discuss outpatient further steroid therapy.

## 2016-01-05 NOTE — ED Notes (Signed)
Patient transported to X-ray 

## 2016-01-05 NOTE — ED Provider Notes (Signed)
Time Seen: Approximately 1502 I have reviewed the triage notes  Chief Complaint: Cough   History of Present Illness: Wesley Newton is a 82 y.o. male who had seen and evaluated on January 12. Patient's also recently followed up with a pulmonologist. He has a known history of underlying pulmonary fibrosis. Patient came here to emergency department because of a coughing spell and increased shortness of breath. Patient to have supposedly had a fever at the nursing facility though here is 98.52F patient's had recent chest x-ray evaluation which is negative and had a shot of steroids last week and finished his last dose of prednisone today. Patient denies any persistent chest pain is mostly concerned about cough and shortness of breath. He's also had some chronic weakness in both lower extremities of unknown etiology though still ambulatory etc. He denies any calf tenderness or swelling.   Past Medical History  Diagnosis Date  . Cancer ([place])   . DVT of leg (deep venous thrombosis) ([place])   . Emphysema lung ([place])   . Elevated PSA   . BPH (benign prostatic hyperplasia)   . COPD (chronic obstructive pulmonary disease) ([place])   . Nocturia     There are no active problems to display for this patient.   Past Surgical History  Procedure Laterality Date  . Cataract extraction    . Hernia repair    . Tonsillectomy      Past Surgical History  Procedure Laterality Date  . Cataract extraction    . Hernia repair    . Tonsillectomy      Current Outpatient Rx  Name  Route  Sig  Dispense  Refill  . albuterol (PROVENTIL HFA;VENTOLIN HFA) 108 (90 Base) MCG/ACT inhaler   Inhalation   Inhale 2 puffs into the lungs every 6 (six) hours as needed for wheezing or shortness of breath.         Marland Kitchen albuterol (PROVENTIL) (2.5 MG/3ML) 0.083% nebulizer solution   Nebulization   Take 2.5 mg by nebulization every 6 (six) hours as needed for wheezing or shortness of breath.         Marland Kitchen aspirin EC 81 MG  tablet   Oral   Take 81 mg by mouth daily.          . budesonide-formoterol (SYMBICORT) 160-4.5 MCG/ACT inhaler   Inhalation   Inhale 2 puffs into the lungs 2 (two) times daily.         . finasteride (PROSCAR) 5 MG tablet   Oral   Take 1 tablet (5 mg total) by mouth daily.   30 tablet   3   . Multiple Vitamins-Minerals (PRESERVISION AREDS 2 PO)   Oral   Take 1 capsule by mouth 2 (two) times daily.         . naproxen (NAPROSYN) 375 MG tablet   Oral   Take 375 mg by mouth 3 (three) times daily as needed for mild pain.       2   . omeprazole (PRILOSEC) 40 MG capsule   Oral   Take 40 mg by mouth daily.       4   . simvastatin (ZOCOR) 40 MG tablet   Oral   Take 40 mg by mouth at bedtime.       0   . tamsulosin (FLOMAX) 0.4 MG CAPS capsule   Oral   Take 0.4 mg by mouth daily.       1   . vitamin B-12 (CYANOCOBALAMIN) 1000 MCG tablet  Oral   Take 1,000 mcg by mouth daily.         . ondansetron (ZOFRAN) 4 MG tablet   Oral   Take 1 tablet (4 mg total) by mouth every 8 (eight) hours as needed for nausea or vomiting. Patient not taking: Reported on 01/05/2016   12 tablet   1   . predniSONE (DELTASONE) 20 MG tablet   Oral   Take 2 tablets (40 mg total) by mouth daily. Patient not taking: Reported on 01/05/2016   10 tablet   0     Allergies:  Codeine; Rapaflo; and Tramadol  Family History: Family History  Problem Relation Age of Onset  . Kidney disease Neg Hx   . Prostate cancer Neg Hx     Social History: Social History  Substance Use Topics  . Smoking status: Former Research scientist (life sciences)  . Smokeless tobacco: None  . Alcohol Use: No     Review of Systems:   10 point review of systems was performed and was otherwise negative:  Constitutional: No fever Eyes: No visual disturbances ENT: No sore throat, ear pain Cardiac: No chest pain Respiratory: No shortness of breath, wheezing, or stridor Abdomen: No abdominal pain, no vomiting, No  diarrhea Endocrine: No weight loss, No night sweats Extremities: No peripheral edema, cyanosis Skin: No rashes, easy bruising Neurologic: No focal weakness, trouble with speech or swollowing Urologic: No dysuria, Hematuria, or urinary frequency   Physical Exam:  ED Triage Vitals  Enc Vitals Group     BP 01/05/16 1500 110/63 mmHg     Pulse Rate 01/05/16 1500 100     Resp 01/05/16 1500 23     Temp 01/05/16 1500 98.4 F (36.9 C)     Temp Source 01/05/16 1500 Oral     SpO2 01/05/16 1500 95 %     Weight 01/05/16 1500 178 lb (80.74 kg)     Height 01/05/16 1500 5\' 6"  (1.676 m)     Head Cir --      Peak Flow --      Pain Score 01/05/16 1907 8     Pain Loc --      Pain Edu? --      Excl. in [place]? --     General: Awake , Alert , and Oriented times 3; GCS 15 no signs of respiratory distress Head: Normal cephalic , atraumatic Eyes: Pupils equal , round, reactive to light Nose/Throat: No nasal drainage, patent upper airway without erythema or exudate.  Neck: Supple, Full range of motion, No anterior adenopathy or palpable thyroid masses Lungs: He has some bilateral lower rhonchi without wheezes or rales Heart: Regular rate, regular rhythm without murmurs , gallops , or rubs Abdomen: Soft, non tender without rebound, guarding , or rigidity; bowel sounds positive and symmetric in all 4 quadrants. No organomegaly .        Extremities: 2 plus symmetric pulses. No edema, clubbing or cyanosis Neurologic: normal ambulation, Motor symmetric without deficits, sensory intact Skin: warm, dry, no rashes   Labs:   All laboratory work was reviewed including any pertinent negatives or positives listed below:  Labs Reviewed  BASIC METABOLIC PANEL - Abnormal; Notable for the following:    Glucose, Bld 138 (*)    BUN 24 (*)    GFR calc non Af Amer 51 (*)    GFR calc Af Amer 59 (*)    All other components within normal limits  CBC - Abnormal; Notable for the following:    WBC  12.4 (*)    RDW 15.0  (*)    All other components within normal limits  TROPONIN I - Abnormal; Notable for the following:    Troponin I 0.04 (*)    All other components within normal limits  BRAIN NATRIURETIC PEPTIDE - Abnormal; Notable for the following:    B Natriuretic Peptide 150.0 (*)    All other components within normal limits  FIBRIN DERIVATIVES D-DIMER ([facility] ONLY)   reviewed the patient's laboratory work shows no significant findings  EKG:  ED ECG REPORT I, Daymon Larsen, the attending physician, personally viewed and interpreted this ECG.  Date: 01/05/2016 EKG Time: 1450 Rate: 97 Rhythm: normal sinus rhythm QRS Axis: normal Intervals: normal ST/T Wave abnormalities: normal Conduction Disutrbances: none Narrative Interpretation: unremarkable No acute ischemic changes   Radiology:   EXAM: CHEST 2 VIEW  COMPARISON: 12/31/2015  FINDINGS: Low lung volumes with chronic coarse interstitial opacities that have a basilar and peripheral pattern. There is no edema, consolidation, effusion, or pneumothorax. Normal heart size and mediastinal contours.  IMPRESSION: Pulmonary fibrosis without acute superimposed finding.      I personally reviewed the radiologic studies    ED Course: Patient's stay here was uneventful and I could not find any evidence of immediate life-threatening issue require hospitalization at this time. Patient's EKG again appears normalized troponins only 0.04 which I felt was not elevated significant enough to be a concern for acute coronary syndrome. He does not appear to have pulmonary edema. Patient's not hypoxic nor having a fever here in emergency department. I felt there is some form of anxiety component as patient seems to get very anxious whenever he has these coughing spells and shortness of breath. He was given a breathing treatment and given a dose of prednisone but advised the family and the patient that he needs to follow up with a pulmonologist to see  if they need to continue his steroids. Questions and concerns were addressed at the bedside.    Assessment:  Acute dyspnea History of pulmonary fibrosis   Final Clinical Impression:  Final diagnoses:  Dyspnea     Plan: Outpatient management Patient was advised to return immediately if condition worsens. Patient was advised to follow up with their primary care physician or other specialized physicians involved in their outpatient care             Daymon Larsen, MD 01/05/16 1927

## 2016-01-05 NOTE — ED Notes (Signed)
Per EMS:  called out for breathing difficulty.  2 new inhalers, used them yesterday and today (nebulizers), never used one before.  cough started this morning.  underlying pulmonary fibrosis, upper lung sounds clear, lower crackles bilateral.  fever, hot to touch 100.2.  96 hr bpm. cxray last week was negative for pneumonia, had a shot of prednisone last week and he just finished an oral dose of prednisone today.  130/77, cbg 126, 96% room air.  just has dry cough, no pain, 18 left arm.   coughing, ambulatory, speaking in complete sentences.  Feels warm, afebrile, not tachycardic.

## 2016-01-06 ENCOUNTER — Encounter: Payer: Self-pay | Admitting: Urology

## 2016-01-06 ENCOUNTER — Ambulatory Visit (INDEPENDENT_AMBULATORY_CARE_PROVIDER_SITE_OTHER): Payer: Medicare Other | Admitting: Urology

## 2016-01-06 VITALS — BP 169/79 | HR 80 | Ht 66.0 in | Wt 177.1 lb

## 2016-01-06 DIAGNOSIS — N401 Enlarged prostate with lower urinary tract symptoms: Secondary | ICD-10-CM | POA: Diagnosis not present

## 2016-01-06 DIAGNOSIS — N138 Other obstructive and reflux uropathy: Secondary | ICD-10-CM

## 2016-01-06 DIAGNOSIS — R351 Nocturia: Secondary | ICD-10-CM

## 2016-01-06 LAB — BLADDER SCAN AMB NON-IMAGING: Scan Result: 32

## 2016-01-06 NOTE — Progress Notes (Signed)
01/06/2016 11:12 PM   [place]as 11-Apr-1927 [id number]  Referring provider: Madelyn Brunner, MD [facility] [place], [place] [address]  Chief Complaint  Patient presents with  . Nocturia    1 year follow up  . Benign Prostatic Hypertrophy    HPI: Patient is an 82 year old Caucasian male with a history of nocturia and BPH with LUTS who presents today for a one year follow up.  Nocturia Patient is experiencing nocturia up to 2-3 times nightly. He finds it very bothersome and interrupting to his sleep. He is suffering from pulmonary fibrosis. He has undergone a sleep study and was not found to have sleep apnea.     BPH WITH LUTS His IPSS score today is 13, which is moderate lower urinary tract symptomatology. He is mostly dissatisfied with his quality life due to his urinary symptoms. His PVR is 32 mL.   His major complaint today nocturia.  He has had these symptoms for the last several months.  He denies any dysuria, hematuria or suprapubic pain.   He currently taking tamsulosin and finasteride.   His has had prostate biopsies in the remote past and no cancer was identified.  His most recent PSA is 1.4 ng/mL on 12/2014.  He also denies any recent fevers, chills, nausea or vomiting.  He does not have a family history of PCa.      IPSS      01/06/16 1000       International Prostate Symptom Score   How often have you had the sensation of not emptying your bladder? Less than 1 in 5     How often have you had to urinate less than every two hours? Less than 1 in 5 times     How often have you found you stopped and started again several times when you urinated? Less than half the time     How often have you found it difficult to postpone urination? Less than 1 in 5 times     How often have you had a weak urinary stream? Almost always     How often have you had to strain to start urination? Not at All     How many times did you typically get up at  night to urinate? 3 Times     Total IPSS Score 13        Score:  1-7 Mild 8-19 Moderate 20-35 Severe     PMH: Past Medical History  Diagnosis Date  . Cancer ([place])   . DVT of leg (deep venous thrombosis) ([place])   . Emphysema lung ([place]   . Elevated PSA   . BPH (benign prostatic hyperplasia)   . COPD (chronic obstructive pulmonary disease) ([place])   . Nocturia   . Pulmonary fibrosis [facility])     Surgical History: Past Surgical History  Procedure Laterality Date  . Cataract extraction    . Hernia repair    . Tonsillectomy      Home Medications:    Medication List       This list is accurate as of: 01/06/16 11:59 PM.  Always use your most recent med list.               albuterol 108 (90 Base) MCG/ACT inhaler  Commonly known as:  PROVENTIL HFA;VENTOLIN HFA  Inhale 2 puffs into the lungs every 6 (six) hours as needed for wheezing or shortness of breath.  albuterol (2.5 MG/3ML) 0.083% nebulizer solution  Commonly known as:  PROVENTIL  Take 2.5 mg by nebulization every 6 (six) hours as needed for wheezing or shortness of breath.     aspirin EC 81 MG tablet  Take 81 mg by mouth daily.     azithromycin 250 MG tablet  Commonly known as:  ZITHROMAX  Reported on 01/06/2016     finasteride 5 MG tablet  Commonly known as:  PROSCAR  Take 1 tablet (5 mg total) by mouth daily.     ipratropium-albuterol 0.5-2.5 (3) MG/3ML Soln  Commonly known as:  DUONEB  Inhale into the lungs.     naproxen 375 MG tablet  Commonly known as:  NAPROSYN  Take 375 mg by mouth 3 (three) times daily as needed for mild pain.     omeprazole 40 MG capsule  Commonly known as:  PRILOSEC  Take 40 mg by mouth daily.     ondansetron 4 MG tablet  Commonly known as:  ZOFRAN  Take 1 tablet (4 mg total) by mouth every 8 (eight) hours as needed for nausea or vomiting.     predniSONE 20 MG tablet  Commonly known as:  DELTASONE  Take 2 tablets (40 mg total) by mouth daily.     PRESERVISION  AREDS 2 PO  Take 1 capsule by mouth 2 (two) times daily.     simvastatin 40 MG tablet  Commonly known as:  ZOCOR  Take 40 mg by mouth at bedtime.     SYMBICORT 160-4.5 MCG/ACT inhaler  Generic drug:  budesonide-formoterol  Inhale 2 puffs into the lungs 2 (two) times daily.     tamsulosin 0.4 MG Caps capsule  Commonly known as:  FLOMAX  Take 0.4 mg by mouth daily.     vitamin B-12 1000 MCG tablet  Commonly known as:  CYANOCOBALAMIN  Take 1,000 mcg by mouth daily.        Allergies:  Allergies  Allergen Reactions  . Codeine Nausea And Vomiting  . Rapaflo [Silodosin] Other (See Comments)    Reaction:  Unknown   . Tramadol Nausea And Vomiting and Other (See Comments)    Pt states that this medication makes him feel crazy.      Family History: Family History  Problem Relation Age of Onset  . Kidney disease Neg Hx   . Prostate cancer Neg Hx     Social History:  reports that he has quit smoking. He does not have any smokeless tobacco history on file. He reports that he does not drink alcohol or use illicit drugs.  ROS: UROLOGY Frequent Urination?: No Hard to postpone urination?: Yes Burning/pain with urination?: No Get up at night to urinate?: Yes Leakage of urine?: Yes Urine stream starts and stops?: Yes Trouble starting stream?: No Do you have to strain to urinate?: No Blood in urine?: No Urinary tract infection?: No Sexually transmitted disease?: No Injury to kidneys or bladder?: No Painful intercourse?: No Weak stream?: Yes Erection problems?: Yes Penile pain?: No  Gastrointestinal Nausea?: No Vomiting?: No Indigestion/heartburn?: No Diarrhea?: No Constipation?: Yes  Constitutional Fever: No Night sweats?: No Weight loss?: No Fatigue?: Yes  Skin Skin rash/lesions?: Yes Itching?: No  Eyes Blurred vision?: Yes Double vision?: No  Ears/Nose/Throat Sore throat?: No Sinus problems?: No  Hematologic/Lymphatic Swollen glands?: No Easy  bruising?: Yes  Cardiovascular Leg swelling?: No Chest pain?: No  Respiratory Cough?: Yes Shortness of breath?: Yes  Endocrine Excessive thirst?: No  Musculoskeletal Back pain?: No Joint pain?:  No  Neurological Headaches?: No Dizziness?: Yes  Psychologic Depression?: No Anxiety?: Yes  Physical Exam: BP 169/79 mmHg  Pulse 80  Ht 5\' 6"  (1.676 m)  Wt 177 lb 1.6 oz (80.332 kg)  BMI 28.60 kg/m2  Constitutional: Well nourished. Alert and oriented, No acute distress. HEENT: [place] AT, moist mucus membranes. Trachea midline, no masses. Cardiovascular: No clubbing, cyanosis, or edema. Respiratory: Normal respiratory effort, no increased work of breathing. GI: Abdomen is soft, non tender, non distended, no abdominal masses. Liver and spleen not palpable.  No hernias appreciated.  Stool sample for occult testing is not indicated.   GU: No CVA tenderness.  No bladder fullness or masses.  Patient with circumcised phallus.  Urethral meatus is patent.  No penile discharge. No penile lesions or rashes. Scrotum without lesions, cysts, rashes and/or edema.  Testicles are located scrotally bilaterally. No masses are appreciated in the testicles. Left and right epididymis are normal.  Rectal: Patient with  normal sphincter tone. Anus and perineum without scarring or rashes. No rectal masses are appreciated. Prostate is approximately 35 grams, no nodules are appreciated. Seminal vesicles are normal. Skin: No rashes, bruises or suspicious lesions. Lymph: No cervical or inguinal adenopathy. Neurologic: Grossly intact, no focal deficits, moving all 4 extremities. Psychiatric: Normal mood and affect.  Laboratory Data: Lab Results  Component Value Date   WBC 12.4* 01/05/2016   HGB 13.6 01/05/2016   HCT 42.2 01/05/2016   MCV 86.2 01/05/2016   PLT 279 01/05/2016    Lab Results  Component Value Date   CREATININE 1.24 01/05/2016     Lab Results  Component Value Date   AST 25 08/02/2015     Lab Results  Component Value Date   ALT 16* 08/02/2015    Pertinent Imaging: Results for RYER, KERWICK (MRN [id number]) as of 01/06/2016 16:53  Ref. Range 01/06/2016 10:48  Scan Result Unknown 32    Assessment & Plan:    1. Nocturia:   I explained to the patient that nocturia is often multi-factorial and difficult to treat.  Sleeping disorders, heart conditions and peripheral vascular disease, diabetes,  enlarged prostate or urethral stricture causing bladder outlet obstruction and/or certain medications.  I have suggested that the patient avoid caffeine and alcohol in the evening. He may also benefit from fluid restrictions after 6:00 in the evening and voiding just prior to bedtime.  He has undergone a sleep study and was not found to have sleep apnea.  His PVR is minimal.  I will add Myrbetriq 25 mg daily.  I have advised the patient of the side effects of Myrbetriq, such as: elevation in BP, urinary retention and/or HA.  He will RTC in one month for an IPSS score and PVR.  2.  BPH with LUTS:   Patient's I PSS score was 13/4.  His most irritative symptom is nocturia.  He will continue the tamsulosin and finasteride. In addition, he will start Myrbetriq 25 mg daily.  He will RTC in one month for an IPSS score and PVR.    Return in about 1 month (around 02/06/2016) for IPSS score and PVR.  These notes generated with voice recognition software. I apologize for typographical errors.  Zara Council, [facility] [address] [place], [place] [address] [phone]

## 2016-01-10 DIAGNOSIS — N400 Enlarged prostate without lower urinary tract symptoms: Secondary | ICD-10-CM | POA: Insufficient documentation

## 2016-01-10 DIAGNOSIS — R351 Nocturia: Secondary | ICD-10-CM | POA: Insufficient documentation

## 2016-01-10 DIAGNOSIS — N138 Other obstructive and reflux uropathy: Secondary | ICD-10-CM | POA: Insufficient documentation

## 2016-01-10 DIAGNOSIS — N401 Enlarged prostate with lower urinary tract symptoms: Secondary | ICD-10-CM

## 2016-01-22 ENCOUNTER — Other Ambulatory Visit: Payer: Self-pay

## 2016-01-22 DIAGNOSIS — R351 Nocturia: Secondary | ICD-10-CM

## 2016-01-22 MED ORDER — TAMSULOSIN HCL 0.4 MG PO CAPS: 0.4000 mg | ORAL_CAPSULE | Freq: Every day | ORAL | Status: DC

## 2016-01-22 NOTE — Progress Notes (Signed)
Pt pharmacy sent a refill request for tamsulosin. Silodosin is in pt allergies. I noticed in your last dictation you stated he should continue tamsulosin and finasteride. I just want to make sure. Please advise.     Refills sent to pt pharmacy

## 2016-01-22 NOTE — Progress Notes (Signed)
That is fine 

## 2016-01-27 ENCOUNTER — Encounter: Payer: Medicare Other | Attending: Specialist | Admitting: *Deleted

## 2016-01-27 VITALS — Ht 65.75 in | Wt 179.0 lb

## 2016-01-27 DIAGNOSIS — J45998 Other asthma: Secondary | ICD-10-CM | POA: Insufficient documentation

## 2016-01-27 DIAGNOSIS — J449 Chronic obstructive pulmonary disease, unspecified: Secondary | ICD-10-CM | POA: Insufficient documentation

## 2016-01-27 DIAGNOSIS — Z87891 Personal history of nicotine dependence: Secondary | ICD-10-CM | POA: Diagnosis not present

## 2016-01-27 DIAGNOSIS — G4730 Sleep apnea, unspecified: Secondary | ICD-10-CM

## 2016-01-27 DIAGNOSIS — N400 Enlarged prostate without lower urinary tract symptoms: Secondary | ICD-10-CM | POA: Diagnosis not present

## 2016-01-27 DIAGNOSIS — J45909 Unspecified asthma, uncomplicated: Secondary | ICD-10-CM

## 2016-01-27 LAB — GLUCOSE, CAPILLARY: GLUCOSE-CAPILLARY: 95 mg/dL (ref 65–99)

## 2016-01-27 NOTE — Progress Notes (Signed)
Pulmonary Individual Treatment Plan  Patient Details  Name: NARON COBURN MRN: [id number] Date of Birth: 09/19/1927 Referring Provider:  Erby Pian, MD  Initial Encounter Date: Date: 01/27/16  Visit Diagnosis: Asthma, chronic, unspecified asthma severity, uncomplicated  Sleep apnea  Patient's Home Medications on Admission:  Current outpatient prescriptions:  .  albuterol (PROVENTIL HFA;VENTOLIN HFA) 108 (90 Base) MCG/ACT inhaler, Inhale 2 puffs into the lungs every 6 (six) hours as needed for wheezing or shortness of breath., Disp: , Rfl:  .  albuterol (PROVENTIL) (2.5 MG/3ML) 0.083% nebulizer solution, Take 2.5 mg by nebulization every 6 (six) hours as needed for wheezing or shortness of breath., Disp: , Rfl:  .  aspirin EC 81 MG tablet, Take 81 mg by mouth daily. , Disp: , Rfl:  .  budesonide-formoterol (SYMBICORT) 160-4.5 MCG/ACT inhaler, Inhale 2 puffs into the lungs 2 (two) times daily., Disp: , Rfl:  .  finasteride (PROSCAR) 5 MG tablet, Take 1 tablet (5 mg total) by mouth daily., Disp: 30 tablet, Rfl: 3 .  Multiple Vitamins-Minerals (PRESERVISION AREDS 2 PO), Take 1 capsule by mouth 2 (two) times daily., Disp: , Rfl:  .  naproxen (NAPROSYN) 375 MG tablet, Take 375 mg by mouth 3 (three) times daily as needed for mild pain. , Disp: , Rfl: 2 .  omeprazole (PRILOSEC) 40 MG capsule, Take 40 mg by mouth daily. , Disp: , Rfl: 4 .  simvastatin (ZOCOR) 40 MG tablet, Take 40 mg by mouth at bedtime. , Disp: , Rfl: 0 .  tamsulosin (FLOMAX) 0.4 MG CAPS capsule, Take 1 capsule (0.4 mg total) by mouth daily., Disp: 30 capsule, Rfl: 1 .  vitamin B-12 (CYANOCOBALAMIN) 1000 MCG tablet, Take 1,000 mcg by mouth daily., Disp: , Rfl:  .  azithromycin (ZITHROMAX) 250 MG tablet, Reported on 01/27/2016, Disp: , Rfl: 0 .  ipratropium-albuterol (DUONEB) 0.5-2.5 (3) MG/3ML SOLN, Inhale into the lungs., Disp: , Rfl:  .  ondansetron (ZOFRAN) 4 MG tablet, Take 1 tablet (4 mg total) by mouth every 8  (eight) hours as needed for nausea or vomiting. (Patient not taking: Reported on 01/05/2016), Disp: 12 tablet, Rfl: 1  Past Medical History: Past Medical History  Diagnosis Date  . Cancer ([place])   . DVT of leg (deep venous thrombosis) ([place])   . Emphysema lung ([place])   . Elevated PSA   . BPH (benign prostatic hyperplasia)   . COPD (chronic obstructive pulmonary disease) ([place])   . Nocturia   . Pulmonary fibrosis ([facility])     Tobacco Use: History  Smoking status  . Former Smoker  Smokeless tobacco  . Not on file    Comment: quit 40 years    Labs: Recent Review Flowsheet Data    There is no flowsheet data to display.       ADL [facility]SD:     ADL [facility]SD      01/27/16 1136 01/27/16 1406     ADL UCSD   ADL Phase Entry     SOB Score total  60    Rest  0    Walk  1    Stairs  3    Bath  4    Dress  4    Shop  2        Pulmonary Function Assessment:     Pulmonary Function Assessment - 01/27/16 1300    Initial Spirometry Results   FVC% 106 %   FEV1% 134 %   FEV1/FVC Ratio 95.65   Breath  Bilateral Breath Sounds Rales;Basilar;Inspiratory   Shortness of Breath Yes;Limiting activity      Exercise Target Goals: Date: 01/27/16  Exercise Program Goal: Individual exercise prescription set with THRR, safety & activity barriers. Participant demonstrates ability to understand and report RPE using BORG scale, to self-measure pulse accurately, and to acknowledge the importance of the exercise prescription.  Exercise Prescription Goal: Starting with aerobic activity 30 plus minutes a day, 3 days per week for initial exercise prescription. Provide home exercise prescription and guidelines that participant acknowledges understanding prior to discharge.  Activity Barriers & Risk Stratification:     Activity Barriers & Cardiac Risk Stratification - 01/27/16 1205    Activity Barriers & Cardiac Risk Stratification   Activity Barriers Balance Concerns;Shortness of  Breath;Deconditioning      6 Minute Walk:     6 Minute Walk      01/27/16 1331       6 Minute Walk   Phase Initial     Distance 1245 feet     Walk Time 6 minutes     # of Rest Breaks 0     MPH 2.4     RPE 12     Perceived Dyspnea  2     Symptoms Yes (comment)     Comments Light headed after walking, blurred vision, short of breath     Resting HR 90 bpm     Resting BP 128/68 mmHg     Max Ex. HR 116 bpm     Max Ex. BP 150/60 mmHg        Initial Exercise Prescription:     Initial Exercise Prescription - 01/27/16 1300    Date of Initial Exercise Prescription   Date 01/27/16   Treadmill   MPH 2   Grade 0   Minutes 10  Use intervals if necessary such as 2, 5 min intervals   Bike   Level 0.4   Watts 12   Minutes 10   Recumbant Bike   Level 3   RPM 40   Watts 25   Minutes 10   NuStep   Level 2   Watts 25   Minutes 10   Arm Ergometer   Level 1   Watts 8   Minutes 10   Arm/Foot Ergometer   Level 4   Watts 12   Minutes 10   Cybex   Level 2   RPM 50   Minutes 10   Recumbant Elliptical   Level 1   RPM 35   Watts 10   Minutes 10   REL-XR   Level 2   Watts 35   Minutes 10   T5 Nustep   Level 1   Watts 10   Minutes 10   Biostep-RELP   Level 2   Watts 15   Minutes 10   Prescription Details   Frequency (times per week) 3   Duration Progress to 30 minutes of continuous aerobic without signs/symptoms of physical distress   Intensity   THRR REST +  30   Ratings of Perceived Exertion 11-13   Perceived Dyspnea 2-4   Progression Continue progressive overload as per policy without signs/symptoms or physical distress.   Resistance Training   Training Prescription Yes   Weight 2   Reps 10-12      Exercise Prescription Changes:   Discharge Exercise Prescription (Final Exercise Prescription Changes):    Nutrition:  Target Goals: Understanding of nutrition guidelines, daily intake of sodium 1500mg , cholesterol 200mg , calories  30% from fat  and 7% or less from saturated fats, daily to have 5 or more servings of fruits and vegetables.  Biometrics:     Pre Biometrics - 01/27/16 1330    Pre Biometrics   Height 5' 5.75" (1.67 m)   Weight 179 lb (81.194 kg)   Waist Circumference 39 inches   Hip Circumference 42.35 inches   Waist to Hip Ratio 0.92 %   BMI (Calculated) 29.2       Nutrition Therapy Plan and Nutrition Goals:   Nutrition Discharge: Rate Your Plate Scores:   Psychosocial: Target Goals: Acknowledge presence or absence of depression, maximize coping skills, provide positive support system. Participant is able to verbalize types and ability to use techniques and skills needed for reducing stress and depression.  Initial Review & Psychosocial Screening:   Quality of Life Scores:   PHQ-9:     Recent Review Flowsheet Data    Depression screen [facility] 2/9 01/27/2016   Decreased Interest 1   Down, Depressed, Hopeless 1   PHQ - 2 Score 2   Altered sleeping 1   Tired, decreased energy 3   Change in appetite 1   Feeling bad or failure about yourself  0   Trouble concentrating 1   Moving slowly or fidgety/restless 1   Suicidal thoughts 0   PHQ-9 Score 9   Difficult doing work/chores Somewhat difficult      Psychosocial Evaluation and Intervention:   Psychosocial Re-Evaluation:  Education: Education Goals: Education classes will be provided on a weekly basis, covering required topics. Participant will state understanding/return demonstration of topics presented.  Learning Barriers/Preferences:     Learning Barriers/Preferences - 01/27/16 1206    Learning Barriers/Preferences   Learning Barriers Hearing;Sight   Learning Preferences None      Education Topics: Initial Evaluation Education: - Verbal, written and demonstration of respiratory meds, RPE/PD scales, oximetry and breathing techniques. Instruction on use of nebulizers and MDIs: cleaning and proper use, rinsing mouth with steroid doses  and importance of monitoring MDI activations.   General Nutrition Guidelines/Fats and Fiber: -Group instruction provided by verbal, written material, models and posters to present the general guidelines for heart healthy nutrition. Gives an explanation and review of dietary fats and fiber.   Controlling Sodium/Reading Food Labels: -Group verbal and written material supporting the discussion of sodium use in heart healthy nutrition. Review and explanation with models, verbal and written materials for utilization of the food label.   Exercise Physiology & Risk Factors: - Group verbal and written instruction with models to review the exercise physiology of the cardiovascular system and associated critical values. Details cardiovascular disease risk factors and the goals associated with each risk factor.   Aerobic Exercise & Resistance Training: - Gives group verbal and written discussion on the health impact of inactivity. On the components of aerobic and resistive training programs and the benefits of this training and how to safely progress through these programs.   Flexibility, Balance, General Exercise Guidelines: - Provides group verbal and written instruction on the benefits of flexibility and balance training programs. Provides general exercise guidelines with specific guidelines to those with heart or lung disease. Demonstration and skill practice provided.   Stress Management: - Provides group verbal and written instruction about the health risks of elevated stress, cause of high stress, and healthy ways to reduce stress.   Depression: - Provides group verbal and written instruction on the correlation between heart/lung disease and depressed mood, treatment options, and the stigmas  associated with seeking treatment.   Exercise & Equipment Safety: - Individual verbal instruction and demonstration of equipment use and safety with use of the equipment.   Infection Prevention: -  Provides verbal and written material to individual with discussion of infection control including proper hand washing and proper equipment cleaning during exercise session.   Falls Prevention: - Provides verbal and written material to individual with discussion of falls prevention and safety.   Diabetes: - Individual verbal and written instruction to review signs/symptoms of diabetes, desired ranges of glucose level fasting, after meals and with exercise. Advice that pre and post exercise glucose checks will be done for 3 sessions at entry of program.   Chronic Lung Diseases: - Group verbal and written instruction to review new updates, new respiratory medications, new advancements in procedures and treatments. Provide informative websites and "800" numbers of self-education.   Lung Procedures: - Group verbal and written instruction to describe testing methods done to diagnose lung disease. Review the outcome of test results. Describe the treatment choices: Pulmonary Function Tests, ABGs and oximetry.   Energy Conservation: - Provide group verbal and written instruction for methods to conserve energy, plan and organize activities. Instruct on pacing techniques, use of adaptive equipment and posture/positioning to relieve shortness of breath.   Triggers: - Group verbal and written instruction to review types of environmental controls: home humidity, furnaces, filters, dust mite/pet prevention, HEPA vacuums. To discuss weather changes, air quality and the benefits of nasal washing.   Exacerbations: - Group verbal and written instruction to provide: warning signs, infection symptoms, calling MD promptly, preventive modes, and value of vaccinations. Review: effective airway clearance, coughing and/or vibration techniques. Create an Sports administrator.   Oxygen: - Individual and group verbal and written instruction on oxygen therapy. Includes supplement oxygen, available portable oxygen systems,  continuous and intermittent flow rates, oxygen safety, concentrators, and Medicare reimbursement for oxygen.   Respiratory Medications: - Group verbal and written instruction to review medications for lung disease. Drug class, frequency, complications, importance of spacers, rinsing mouth after steroid MDI's, and proper cleaning methods for nebulizers.   AED/CPR: - Group verbal and written instruction with the use of models to demonstrate the basic use of the AED with the basic ABC's of resuscitation.   Breathing Retraining: - Provides individuals verbal and written instruction on purpose, frequency, and proper technique of diaphragmatic breathing and pursed-lipped breathing. Applies individual practice skills.   Anatomy and Physiology of the Lungs: - Group verbal and written instruction with the use of models to provide basic lung anatomy and physiology related to function, structure and complications of lung disease.   Heart Failure: - Group verbal and written instruction on the basics of heart failure: signs/symptoms, treatments, explanation of ejection fraction, enlarged heart and cardiomyopathy.   Sleep Apnea: - Individual verbal and written instruction to review Obstructive Sleep Apnea. Review of risk factors, methods for diagnosing and types of masks and machines for OSA.   Anxiety: - Provides group, verbal and written instruction on the correlation between heart/lung disease and anxiety, treatment options, and management of anxiety.   Relaxation: - Provides group, verbal and written instruction about the benefits of relaxation for patients with heart/lung disease. Also provides patients with examples of relaxation techniques.   Knowledge Questionnaire Score:     Knowledge Questionnaire Score - 01/27/16 1403    Knowledge Questionnaire Score   Pre Score 6/10      Personal Goals and Risk Factors at Admission:   Personal Goals  and Risk Factors Review:    Personal  Goals Discharge (Final Personal Goals and Risk Factors Review):    ITP Comments:     ITP Comments      01/27/16 1409 01/27/16 1411         ITP Comments Today Barnabas Lister attended his orientation. Program and personal goals were reviewed with Barnabas Lister.  Expectationis that Rhylan will meet his goals at the end of the 36 session. Review will be done during his program to set short term goals as needed.  At the end of the 6 minute walk, Khair experienced an increase in his "dizziness" . This got better after a couple cups of water and sitting, but did not totally resolve. after discussion with jacj, I took him by wheelchair to the [facility] at his primary MD office. Roshun will be evaluated and treated at the clinic. BP was fine, Blood sugar check  was 82ml/Dl.          Comments: Mr Cabiness plans to start [facility] on 02/01/2016 and attend 3 days/week.

## 2016-01-27 NOTE — Patient Instructions (Signed)
Patient Instructions  Patient Details  Name: Wesley Newton MRN: [id number] Date of Birth: 08-Dec-1927 Referring Provider:  Erby Pian, MD  Below are the personal goals you chose as well as exercise and nutrition goals. Our goal is to help you keep on track towards obtaining and maintaining your goals. We will be discussing your progress on these goals with you throughout the program.  Initial Exercise Prescription:     Initial Exercise Prescription - 01/27/16 1300    Date of Initial Exercise Prescription   Date 01/27/16   Treadmill   MPH 2   Grade 0   Minutes 10  Use intervals if necessary such as 2, 5 min intervals   Bike   Level 0.4   Watts 12   Minutes 10   Recumbant Bike   Level 3   RPM 40   Watts 25   Minutes 10   NuStep   Level 2   Watts 25   Minutes 10   Arm Ergometer   Level 1   Watts 8   Minutes 10   Arm/Foot Ergometer   Level 4   Watts 12   Minutes 10   Cybex   Level 2   RPM 50   Minutes 10   Recumbant Elliptical   Level 1   RPM 35   Watts 10   Minutes 10   REL-XR   Level 2   Watts 35   Minutes 10   T5 Nustep   Level 1   Watts 10   Minutes 10   Biostep-RELP   Level 2   Watts 15   Minutes 10   Prescription Details   Frequency (times per week) 3   Duration Progress to 30 minutes of continuous aerobic without signs/symptoms of physical distress   Intensity   THRR REST +  30   Ratings of Perceived Exertion 11-13   Perceived Dyspnea 2-4   Progression Continue progressive overload as per policy without signs/symptoms or physical distress.   Resistance Training   Training Prescription Yes   Weight 2   Reps 10-12      Exercise Goals: Frequency: Be able to perform aerobic exercise three times per week working toward 3-5 days per week.  Intensity: Work with a perceived exertion of 11 (fairly light) - 15 (hard) as tolerated. Follow your new exercise prescription and watch for changes in prescription as you progress with the  program. Changes will be reviewed with you when they are made.  Duration: You should be able to do 30 minutes of continuous aerobic exercise in addition to a 5 minute warm-up and a 5 minute cool-down routine.  Nutrition Goals: Your personal nutrition goals will be established when you do your nutrition analysis with the dietician.  The following are nutrition guidelines to follow: Cholesterol < 200mg /day Sodium < 1500mg /day Fiber: Men over 50 yrs - 30 grams per day  Personal Goals:     Personal Goals and Risk Factors at Admission - 01/27/16 1422    Personal Goals and Risk Factors on Admission   Increase Aerobic Exercise and Physical Activity Yes  Wesley Newton utilizes the Treadmill and weight machine at his residence. He tries 3-5 days a week if he is feeling well. His goal is to stay active.  He has recently had concerns with "dizziness" and he was advised to talk to his PMD about the symptoms.    Intervention While in program, learn and follow the exercise prescription taught. Start at a  low level workload and increase workload after able to maintain previous level for 30 minutes. Increase time before increasing intensity.   Understand more about Heart/Pulmonary Disease. Yes  Wesley Newton came to the program with a diagnosis of Asthma and Sleep apnea.  Wesley Newton voiced interest in learning more about the disease process and what he can do to keep it controlled.      Intervention While in program utilize professionals for any questions, and attend the education sessions. Great websites to use are www.americanheart.org or www.lung.org for reliable information.  Wesley Newton has stoppped using the CPAP,because he tried every method available to use the machine and he did not tolerate any method.   Improve shortness of breath with ADL's Yes  Wesley Newton scored some fear with SOB, he stated he has SOB with exertion, that has learned to space his activities to be less SOB   Intervention While in program, learn and follow the  exercise prescription taught. Start at a low level workload and increase workload ad advised by the exercise physiologist. Increase time before increasing intensity.   Develop more efficient breathing techniques such as purse lipped breathing and diaphragmatic breathing; and practicing self-pacing with activity Yes  Wesley Newton instructed on purse lipped breathing and he had great return demonstration.   Intervention While in program, learn and utilize the specific breathing techniques taught to you. Continue to practice and use the techniques as needed.   Increase knowledge of respiratory medications and ability to use respiratory devices properly.  Yes  Wesley Newton has ventolin as needed, new nebuliser to use. Symbicort inhaler also.  Wesley Newton did not have a spacer, will provide him with one and instruction for proper use next visit.   Intervention While in program learn and demonstrate appropriate use of your oxygen therapy by increasing flow with exertion, manage oxygen tank operation, including continuous and intermittent flow.  Understanding oxygen is a drug ordered by your physician.   Hypertension Yes   Goal Participant will see blood pressure controlled within the values of 140/6mm/Hg or within value directed by their physician.   Intervention Provide nutrition & aerobic exercise along with prescribed medications to achieve BP 140/90 or less.   Lipids Yes   Goal Cholesterol controlled with medications as prescribed, with individualized exercise RX and with personalized nutrition plan. Value goals: LDL < 70mg , HDL > 40mg . Participant states understanding of desired cholesterol values and following prescriptions.   Intervention Provide nutrition & aerobic exercise along with prescribed medications to achieve LDL 70mg , HDL >40mg .   Personal Goal Other Yes   Personal Goal Stay Active   Intervention Other  Follow exercise prescription guidelines to gradually increase activity levels without increase in SOB.        Tobacco Use Initial Evaluation: History  Smoking status   Former Smoker  Smokeless tobacco   Not on file    Comment: quit 40 years    Copy of goals given to participant.

## 2016-02-01 ENCOUNTER — Encounter: Payer: Medicare Other | Admitting: *Deleted

## 2016-02-01 NOTE — Progress Notes (Signed)
Error in charting on wrong patient.

## 2016-02-03 ENCOUNTER — Encounter: Payer: Medicare Other | Admitting: *Deleted

## 2016-02-03 DIAGNOSIS — J45909 Unspecified asthma, uncomplicated: Secondary | ICD-10-CM

## 2016-02-03 DIAGNOSIS — J45998 Other asthma: Secondary | ICD-10-CM | POA: Diagnosis not present

## 2016-02-03 DIAGNOSIS — G4730 Sleep apnea, unspecified: Secondary | ICD-10-CM

## 2016-02-03 NOTE — Progress Notes (Signed)
Daily Session Note  Patient Details  Name: Wesley Newton MRN: [id number] Date of Birth: 08-27-1927 Referring Provider:  Erby Pian, MD  Encounter Date: 02/03/2016  Check In:     Session Check In - 02/03/16 1050    Check-In   Location [facility]   Staff Present Heath Lark, RN, BSN, CCRP;Renee Dillard Essex, MS, ACSM CEP, Exercise Physiologist;Laureen Owens Shark, BS, RRT, Respiratory Therapist   Supervising physician immediately available to respond to emergencies [facility] immediately available ER MD   Physician(s) Drs: Jimmye Norman and Mariea Clonts   Medication changes reported     No   Fall or balance concerns reported    No   Warm-up and Cool-down Performed on first and last piece of equipment   Resistance Training Performed No   VAD Patient? No   Pain Assessment   Currently in Pain? No/denies         Goals Met:  Exercise tolerated well Personal goals reviewed Strength training completed today  Goals Unmet:  Not Applicable  Goals Comments: First session for [name].  He did well with the current exercise prescription.    Dr. Emily Filbert is Medical Director for [facility] and [facility].

## 2016-02-05 ENCOUNTER — Encounter: Payer: Medicare Other | Admitting: *Deleted

## 2016-02-05 DIAGNOSIS — J45909 Unspecified asthma, uncomplicated: Secondary | ICD-10-CM

## 2016-02-05 DIAGNOSIS — J45998 Other asthma: Secondary | ICD-10-CM | POA: Diagnosis not present

## 2016-02-05 NOTE — Progress Notes (Signed)
Daily Session Note  Patient Details  Name: Wesley Newton MRN: [id number] Date of Birth: 10/07/1927 Referring Provider:  Fleming, Herbon E, MD  Encounter Date: 02/05/2016  Check In:     Session Check In - 02/05/16 1055    Check-In   Location [facility]   Staff Present Stacey Joyce, RRT, RCP, Respiratory Therapist;Renee MacMillan, MS, ACSM CEP, Exercise Physiologist;Susanne Bice, RN, BSN, CCRP   Supervising physician immediately available to respond to emergencies [facility] immediately available ER MD   Physician(s) Quigley and Williams   Medication changes reported     No   Fall or balance concerns reported    No   Warm-up and Cool-down Performed on first and last piece of equipment   Resistance Training Performed Yes   VAD Patient? No   Pain Assessment   Currently in Pain? No/denies   Multiple Pain Sites No           Exercise Prescription Changes - 02/05/16 1000    Exercise Review   Progression Yes   Response to Exercise   Symptoms None   Comments Reviewed individualized exercise prescription and made increases per departmental policy. Exercise increases were discussed with the patient and they were able to perform the new work loads without issue (no signs or symptoms).    Duration Progress to 30 minutes of continuous aerobic without signs/symptoms of physical distress   Intensity Rest + 30   Progression Continue progressive overload as per policy without signs/symptoms or physical distress.   Resistance Training   Training Prescription Yes   Weight 2   Reps 10-12   Treadmill   MPH 2   Grade 0   Minutes 10  5 min intervals x 2   Recumbant Bike   Level 3   RPM 60   Minutes 12   NuStep   Level 4   Watts 25   Minutes 10      Goals Met:  Proper associated with RPD/PD & O2 Sat Independence with exercise equipment Using PLB without cueing & demonstrates good technique Exercise tolerated well Personal goals reviewed Strength training  completed today  Goals Unmet:  Not Applicable  Goals Comments: Patient completed exercise prescription and all exercise goals during rehab session. The exercise was tolerated well and the patient is progressing in the program.    Dr. Mark Miller is Medical Director for [facility] and [facility]. 

## 2016-02-08 ENCOUNTER — Encounter: Payer: Medicare Other | Admitting: *Deleted

## 2016-02-08 DIAGNOSIS — J45909 Unspecified asthma, uncomplicated: Secondary | ICD-10-CM

## 2016-02-08 DIAGNOSIS — J45998 Other asthma: Secondary | ICD-10-CM | POA: Diagnosis not present

## 2016-02-08 NOTE — Progress Notes (Signed)
Daily Session Note  Patient Details  Name: Wesley Newton MRN: [id number] Date of Birth: 1927/08/24 Referring Provider:  Erby Pian, MD  Encounter Date: 02/08/2016  Check In:     Session Check In - 02/08/16 1056    Check-In   Location [facility]   Staff Present Candiss Norse, MS, ACSM CEP, Exercise Physiologist;Kelly Amedeo Plenty, BS, ACSM CEP, Exercise Physiologist;Steven Way, BS, ACSM EP-C, Exercise Physiologist;Laureen Owens Shark, BS, RRT, Respiratory Therapist   Supervising physician immediately available to respond to emergencies [facility] immediately available ER MD   Physician(s) Owens Shark and Reita Cliche   Medication changes reported     No   Fall or balance concerns reported    No   Warm-up and Cool-down Performed on first and last piece of equipment   Resistance Training Performed Yes   VAD Patient? No   Pain Assessment   Currently in Pain? No/denies   Multiple Pain Sites No           Exercise Prescription Changes - 02/08/16 1000    Exercise Review   Progression Yes   Response to Exercise   Symptoms None   Comments [name]'s stamina is increasing and this is reflected in longer exercise times. He is not quite exercising for the full class time.   Duration Progress to 30 minutes of continuous aerobic without signs/symptoms of physical distress   Intensity Rest + 30   Progression Continue progressive overload as per policy without signs/symptoms or physical distress.   Resistance Training   Training Prescription Yes   Weight 2   Reps 10-12   Treadmill   MPH 2   Grade 0   Minutes 10  5 min intervals x 2   Recumbant Bike   Level 3   RPM 60   Minutes 15   NuStep   Level 4   Watts 25   Minutes 12      Goals Met:  Proper associated with RPD/PD & O2 Sat Independence with exercise equipment Using PLB without cueing & demonstrates good technique Exercise tolerated well Personal goals reviewed Strength training completed today  Goals Unmet:  Not  Applicable  Goals Comments: Patient completed exercise prescription and all exercise goals during rehab session. The exercise was tolerated well and the patient is progressing in the program.    Dr. Emily Filbert is Medical Director for [facility] and [facility].

## 2016-02-08 NOTE — Progress Notes (Signed)
Pulmonary Individual Treatment Plan  Patient Details  Name: Wesley Newton MRN: [id number] Date of Birth: Sep 05, 1927 Referring Provider:  Erby Pian, MD  Initial Encounter Date: 01/27/2016  Visit Diagnosis: Asthma, chronic, unspecified asthma severity, uncomplicated  Patient's Home Medications on Admission:  Current outpatient prescriptions:    albuterol (PROVENTIL HFA;VENTOLIN HFA) 108 (90 Base) MCG/ACT inhaler, Inhale 2 puffs into the lungs every 6 (six) hours as needed for wheezing or shortness of breath., Disp: , Rfl:    albuterol (PROVENTIL) (2.5 MG/3ML) 0.083% nebulizer solution, Take 2.5 mg by nebulization every 6 (six) hours as needed for wheezing or shortness of breath., Disp: , Rfl:    aspirin EC 81 MG tablet, Take 81 mg by mouth daily. , Disp: , Rfl:    azithromycin (ZITHROMAX) 250 MG tablet, Reported on 01/27/2016, Disp: , Rfl: 0   budesonide-formoterol (SYMBICORT) 160-4.5 MCG/ACT inhaler, Inhale 2 puffs into the lungs 2 (two) times daily., Disp: , Rfl:    finasteride (PROSCAR) 5 MG tablet, Take 1 tablet (5 mg total) by mouth daily., Disp: 30 tablet, Rfl: 3   ipratropium-albuterol (DUONEB) 0.5-2.5 (3) MG/3ML SOLN, Inhale into the lungs., Disp: , Rfl:    Multiple Vitamins-Minerals (PRESERVISION AREDS 2 PO), Take 1 capsule by mouth 2 (two) times daily., Disp: , Rfl:    naproxen (NAPROSYN) 375 MG tablet, Take 375 mg by mouth 3 (three) times daily as needed for mild pain. , Disp: , Rfl: 2   omeprazole (PRILOSEC) 40 MG capsule, Take 40 mg by mouth daily. , Disp: , Rfl: 4   ondansetron (ZOFRAN) 4 MG tablet, Take 1 tablet (4 mg total) by mouth every 8 (eight) hours as needed for nausea or vomiting. (Patient not taking: Reported on 01/05/2016), Disp: 12 tablet, Rfl: 1   simvastatin (ZOCOR) 40 MG tablet, Take 40 mg by mouth at bedtime. , Disp: , Rfl: 0   tamsulosin (FLOMAX) 0.4 MG CAPS capsule, Take 1 capsule (0.4 mg total) by mouth daily., Disp: 30 capsule, Rfl: 1    vitamin B-12 (CYANOCOBALAMIN) 1000 MCG tablet, Take 1,000 mcg by mouth daily., Disp: , Rfl:   Past Medical History: Past Medical History  Diagnosis Date   Cancer ([facility])    DVT of leg (deep venous thrombosis) (HCC)    Emphysema lung (HCC)    Elevated PSA    BPH (benign prostatic hyperplasia)    COPD (chronic obstructive pulmonary disease) (HCC)    Nocturia    Pulmonary fibrosis (HCC)     Tobacco Use: History  Smoking status   Former Smoker  Smokeless tobacco   Not on file    Comment: quit 40 years    Labs: Recent Review Flowsheet Data    There is no flowsheet data to display.       ADL [facility]SD:     ADL [facility]SD      01/27/16 1136 01/27/16 1406     ADL UCSD   ADL Phase Entry     SOB Score total  60    Rest  0    Walk  1    Stairs  3    Bath  4    Dress  4    Shop  2        Pulmonary Function Assessment:     Pulmonary Function Assessment - 01/27/16 1300    Initial Spirometry Results   FVC% 106 %   FEV1% 134 %   FEV1/FVC Ratio 95.65   Breath   Bilateral Breath Sounds  Rales;Basilar;Inspiratory   Shortness of Breath Yes;Limiting activity      Exercise Target Goals:    Exercise Program Goal: Individual exercise prescription set with THRR, safety & activity barriers. Participant demonstrates ability to understand and report RPE using BORG scale, to self-measure pulse accurately, and to acknowledge the importance of the exercise prescription.  Exercise Prescription Goal: Starting with aerobic activity 30 plus minutes a day, 3 days per week for initial exercise prescription. Provide home exercise prescription and guidelines that participant acknowledges understanding prior to discharge.  Activity Barriers & Risk Stratification:     Activity Barriers & Cardiac Risk Stratification - 01/27/16 1205    Activity Barriers & Cardiac Risk Stratification   Activity Barriers Balance Concerns;Shortness of Breath;Deconditioning      6 Minute Walk:     6  Minute Walk      01/27/16 1331       6 Minute Walk   Phase Initial     Distance 1245 feet     Walk Time 6 minutes     # of Rest Breaks 0     MPH 2.4     RPE 12     Perceived Dyspnea  2     Symptoms Yes (comment)     Comments Light headed after walking, blurred vision, short of breath     Resting HR 90 bpm     Resting BP 128/68 mmHg     Max Ex. HR 116 bpm     Max Ex. BP 150/60 mmHg        Initial Exercise Prescription:     Initial Exercise Prescription - 01/27/16 1300    Date of Initial Exercise Prescription   Date 01/27/16   Treadmill   MPH 2   Grade 0   Minutes 10  Use intervals if necessary such as 2, 5 min intervals   Bike   Level 0.4   Watts 12   Minutes 10   Recumbant Bike   Level 3   RPM 40   Watts 25   Minutes 10   NuStep   Level 2   Watts 25   Minutes 10   Arm Ergometer   Level 1   Watts 8   Minutes 10   Arm/Foot Ergometer   Level 4   Watts 12   Minutes 10   Cybex   Level 2   RPM 50   Minutes 10   Recumbant Elliptical   Level 1   RPM 35   Watts 10   Minutes 10   REL-XR   Level 2   Watts 35   Minutes 10   T5 Nustep   Level 1   Watts 10   Minutes 10   Biostep-RELP   Level 2   Watts 15   Minutes 10   Prescription Details   Frequency (times per week) 3   Duration Progress to 30 minutes of continuous aerobic without signs/symptoms of physical distress   Intensity   THRR REST +  30   Ratings of Perceived Exertion 11-13   Perceived Dyspnea 2-4   Progression Continue progressive overload as per policy without signs/symptoms or physical distress.   Resistance Training   Training Prescription Yes   Weight 2   Reps 10-12      Exercise Prescription Changes:     Exercise Prescription Changes      01/27/16 1100 02/03/16 1200 02/05/16 1000 02/08/16 1000     Exercise Review   Progression  Yes Yes    Response to Exercise   Blood Pressure (Admit) 128/68 mmHg 122/70 mmHg      Blood Pressure (Exercise) 150/60 mmHg 136/60 mmHg       Blood Pressure (Exit) 128/62 mmHg 124/76 mmHg      Heart Rate (Admit) 90 bpm 92 bpm      Heart Rate (Exercise) 116 bpm 111 bpm      Heart Rate (Exit) 96 bpm 104 bpm      Oxygen Saturation (Admit) 96 % 95 %      Oxygen Saturation (Exercise) 90 % 92 %      Oxygen Saturation (Exit) 96 % 97 %      Rating of Perceived Exertion (Exercise)  11      Perceived Dyspnea (Exercise)  3      Symptoms  Heavy cough, he says it is chronic None None    Comments  First day of exercise! Patient was oriented to the gym and the equipment functions and settings. Procedures and policies of the gym were outlined and explained. The patient's individual exercise prescription and treatment plan were reviewed with them. All starting workloads were established based on the results of the functional testing  done at the initial intake visit. The plan for exercise progression was also introduced and progression will be customized based on the patient's performance and goals Reviewed individualized exercise prescription and made increases per departmental policy. Exercise increases were discussed with the patient and they were able to perform the new work loads without issue (no signs or symptoms).  Dessie's stamina is increasing and this is reflected in longer exercise times. He is not quite exercising for the full class time.    Duration  Progress to 30 minutes of continuous aerobic without signs/symptoms of physical distress Progress to 30 minutes of continuous aerobic without signs/symptoms of physical distress Progress to 30 minutes of continuous aerobic without signs/symptoms of physical distress    Intensity  Rest + 30 Rest + 30 Rest + 30    Progression  Continue progressive overload as per policy without signs/symptoms or physical distress. Continue progressive overload as per policy without signs/symptoms or physical distress. Continue progressive overload as per policy without signs/symptoms or physical distress.     Resistance Training   Training Prescription  Yes Yes Yes    Weight  '2 2 2    ' Reps  10-12 10-12 10-12    Treadmill   MPH  '2 2 2    ' Grade  0 0 0    Minutes  10  5 min intervals x '2 10  5 ' min intervals x '2 10  5 ' min intervals x 2    Recumbant Bike   Level  '3 3 3    ' RPM  60 60 60    Minutes  '10 12 15    ' NuStep   Level  '2 4 4    ' Watts  '25 25 25    ' Minutes  '10 10 12       ' Discharge Exercise Prescription (Final Exercise Prescription Changes):     Exercise Prescription Changes - 02/08/16 1000    Exercise Review   Progression Yes   Response to Exercise   Symptoms None   Comments Egor's stamina is increasing and this is reflected in longer exercise times. He is not quite exercising for the full class time.   Duration Progress to 30 minutes of continuous aerobic without signs/symptoms of physical distress   Intensity Rest +  30   Progression Continue progressive overload as per policy without signs/symptoms or physical distress.   Resistance Training   Training Prescription Yes   Weight 2   Reps 10-12   Treadmill   MPH 2   Grade 0   Minutes 10  5 min intervals x 2   Recumbant Bike   Level 3   RPM 60   Minutes 15   NuStep   Level 4   Watts 25   Minutes 12       Nutrition:  Target Goals: Understanding of nutrition guidelines, daily intake of sodium <1576m, cholesterol <2072m calories 30% from fat and 7% or less from saturated fats, daily to have 5 or more servings of fruits and vegetables.  Biometrics:     Pre Biometrics - 01/27/16 1330    Pre Biometrics   Height 5' 5.75" (1.67 m)   Weight 179 lb (81.194 kg)   Waist Circumference 39 inches   Hip Circumference 42.35 inches   Waist to Hip Ratio 0.92 %   BMI (Calculated) 29.2       Nutrition Therapy Plan and Nutrition Goals:     Nutrition Therapy & Goals - 01/27/16 1416    Intervention Plan   Intervention Using nutrition plan and personal goals to gain a healthy nutrition lifestyle. Add exercise as  prescribed.      Nutrition Discharge: Rate Your Plate Scores:   Psychosocial: Target Goals: Acknowledge presence or absence of depression, maximize coping skills, provide positive support system. Participant is able to verbalize types and ability to use techniques and skills needed for reducing stress and depression.  Initial Review & Psychosocial Screening:     Initial Psych Review & Screening - 01/27/16 1420    Initial Review   Current issues with Current Sleep Concerns   Family Dynamics   Good Support System? Yes   Comments [facility] Several neighbors chaeck on him daily. He has a son and a daughter that he sees weekly and another that lives in [place] He is widowed for 5 years. JaJerrelill benifit from the class participation and the exercise.    Barriers   Psychosocial barriers to participate in program There are no identifiable barriers or psychosocial needs.;The patient should benefit from training in stress management and relaxation.   Screening Interventions   Interventions Encouraged to exercise      Quality of Life Scores:     Quality of Life - 01/27/16 1558    Quality of Life Scores   Health/Function Pre 10.22 %   Socioeconomic Pre 17.14 %   Psych/Spiritual Pre 19.71 %   Family Pre 24 %   GLOBAL Pre 15.22 %      PHQ-9:     Recent Review Flowsheet Data    [facility] 01[facility]   Decreased Interest 1   Down, Depressed, Hopeless 1   PHQ - 2 Score 2   Altered sleeping 1   Tired, decreased energy 3   Change in appetite 1   Feeling bad or failure about yourself  0   Trouble concentrating 1   Moving slowly or fidgety/restless 1   Suicidal thoughts 0   PHQ-9 Score 9   Difficult doing work/chores Somewhat difficult      Psychosocial Evaluation and Intervention:     Psychosocial Evaluation - 02/03/16 1045    Psychosocial Evaluation & Interventions   Interventions Encouraged to exercise  with the  program and follow exercise prescription   Comments Counselor met with Mr. Stangl today for initial psychosocial evaluation.  He is an 82 year old gentleman who has Pulmonary Fibrosis.  He has a good support system living in a retirement community as well as (2) adult children who live close by and active involvement in his local church community.  Mr. Sermon has some memory issues and struggled a little answering some of  counselor's questions.  He states that he has struggled with depression and anxiety for awhile but is not currently on any medications, although he states the Dr. prescribed something, but his "insurance won't cover it."  He states that his current mood is "okay" but due to his health and some of the medication reactions he has experienced, it "could be better."   He has goals to increase his stamina and strength in this program.  He is sleeping well and has a good appetite.  Mr. Hazen plans to supplement working out in this program with exercising in the gym at the retirement community where he resides.  Counselor will continue to follow up with him, especially on his mood.        Psychosocial Re-Evaluation:  Education: Education Goals: Education classes will be provided on a weekly basis, covering required topics. Participant will state understanding/return demonstration of topics presented.  Learning Barriers/Preferences:     Learning Barriers/Preferences - 01/27/16 1206    Learning Barriers/Preferences   Learning Barriers Hearing;Sight   Learning Preferences None      Education Topics: Initial Evaluation Education: - Verbal, written and demonstration of respiratory meds, RPE/PD scales, oximetry and breathing techniques. Instruction on use of nebulizers and MDIs: cleaning and proper use, rinsing mouth with steroid doses and importance of monitoring MDI activations.          Pulmonary Rehab from 02/08/2016 in [facility] Cardiac and Pulmonary Rehab   Date   01/27/16   Educator  sb   Instruction Review Code  2- meets goals/outcomes      General Nutrition Guidelines/Fats and Fiber: -Group instruction provided by verbal, written material, models and posters to present the general guidelines for heart healthy nutrition. Gives an explanation and review of dietary fats and fiber.   Controlling Sodium/Reading Food Labels: -Group verbal and written material supporting the discussion of sodium use in heart healthy nutrition. Review and explanation with models, verbal and written materials for utilization of the food label.      Pulmonary Rehab from 02/08/2016 in [facility] Cardiac and Pulmonary Rehab   Date  02/08/16   Educator  SB   Instruction Review Code  2- meets goals/outcomes      Exercise Physiology & Risk Factors: - Group verbal and written instruction with models to review the exercise physiology of the cardiovascular system and associated critical values. Details cardiovascular disease risk factors and the goals associated with each risk factor.   Aerobic Exercise & Resistance Training: - Gives group verbal and written discussion on the health impact of inactivity. On the components of aerobic and resistive training programs and the benefits of this training and how to safely progress through these programs.   Flexibility, Balance, General Exercise Guidelines: - Provides group verbal and written instruction on the benefits of flexibility and balance training programs. Provides general exercise guidelines with specific guidelines to those with heart or lung disease. Demonstration and skill practice provided.      Pulmonary Rehab from 02/08/2016 in [facility] Cardiac and Pulmonary Rehab   Date  02/03/16  Educator  RM   Instruction Review Code  2- meets goals/outcomes      Stress Management: - Provides group verbal and written instruction about the health risks of elevated stress, cause of high stress, and healthy ways to reduce  stress.   Depression: - Provides group verbal and written instruction on the correlation between heart/lung disease and depressed mood, treatment options, and the stigmas associated with seeking treatment.   Exercise & Equipment Safety: - Individual verbal instruction and demonstration of equipment use and safety with use of the equipment.      Pulmonary Rehab from 02/08/2016 in [facility] Cardiac and Pulmonary Rehab   Date  01/27/16   Educator  Sb   Instruction Review Code  2- meets goals/outcomes      Infection Prevention: - Provides verbal and written material to individual with discussion of infection control including proper hand washing and proper equipment cleaning during exercise session.      Pulmonary Rehab from 02/08/2016 in [facility] Cardiac and Pulmonary Rehab   Date  01/27/16   Educator  SB   Instruction Review Code  2- meets goals/outcomes      Falls Prevention: - Provides verbal and written material to individual with discussion of falls prevention and safety.      Pulmonary Rehab from 02/08/2016 in [facility] Cardiac and Pulmonary Rehab   Date  01/27/16   Educator  SB   Instruction Review Code  2- meets goals/outcomes      Diabetes: - Individual verbal and written instruction to review signs/symptoms of diabetes, desired ranges of glucose level fasting, after meals and with exercise. Advice that pre and post exercise glucose checks will be done for 3 sessions at entry of program.   Chronic Lung Diseases: - Group verbal and written instruction to review new updates, new respiratory medications, new advancements in procedures and treatments. Provide informative websites and "800" numbers of self-education.      Pulmonary Rehab from 02/08/2016 in [facility] [place] Cardiac and Pulmonary Rehab   Date  02/01/16   Educator  L. Owens Shark, RT   Instruction Review Code  2- meets goals/outcomes      Lung Procedures: - Group verbal and written instruction to describe testing methods done to diagnose  lung disease. Review the outcome of test results. Describe the treatment choices: Pulmonary Function Tests, ABGs and oximetry.   Energy Conservation: - Provide group verbal and written instruction for methods to conserve energy, plan and organize activities. Instruct on pacing techniques, use of adaptive equipment and posture/positioning to relieve shortness of breath.   Triggers: - Group verbal and written instruction to review types of environmental controls: home humidity, furnaces, filters, dust mite/pet prevention, HEPA vacuums. To discuss weather changes, air quality and the benefits of nasal washing.   Exacerbations: - Group verbal and written instruction to provide: warning signs, infection symptoms, calling MD promptly, preventive modes, and value of vaccinations. Review: effective airway clearance, coughing and/or vibration techniques. Create an Sports administrator.   Oxygen: - Individual and group verbal and written instruction on oxygen therapy. Includes supplement oxygen, available portable oxygen systems, continuous and intermittent flow rates, oxygen safety, concentrators, and Medicare reimbursement for oxygen.   Respiratory Medications: - Group verbal and written instruction to review medications for lung disease. Drug class, frequency, complications, importance of spacers, rinsing mouth after steroid MDI's, and proper cleaning methods for nebulizers.   AED/CPR: - Group verbal and written instruction with the use of models to demonstrate the basic use of the AED with  the basic ABC's of resuscitation.   Breathing Retraining: - Provides individuals verbal and written instruction on purpose, frequency, and proper technique of diaphragmatic breathing and pursed-lipped breathing. Applies individual practice skills.      Pulmonary Rehab from 02/08/2016 in [facility] Cardiac and Pulmonary Rehab   Date  01/27/16   Educator  sB   Instruction Review Code  2- meets goals/outcomes       Anatomy and Physiology of the Lungs: - Group verbal and written instruction with the use of models to provide basic lung anatomy and physiology related to function, structure and complications of lung disease.   Heart Failure: - Group verbal and written instruction on the basics of heart failure: signs/symptoms, treatments, explanation of ejection fraction, enlarged heart and cardiomyopathy.   Sleep Apnea: - Individual verbal and written instruction to review Obstructive Sleep Apnea. Review of risk factors, methods for diagnosing and types of masks and machines for OSA.   Anxiety: - Provides group, verbal and written instruction on the correlation between heart/lung disease and anxiety, treatment options, and management of anxiety.   Relaxation: - Provides group, verbal and written instruction about the benefits of relaxation for patients with heart/lung disease. Also provides patients with examples of relaxation techniques.   Knowledge Questionnaire Score:     Knowledge Questionnaire Score - 01/27/16 1403    Knowledge Questionnaire Score   Pre Score 6/10      Personal Goals and Risk Factors at Admission:     Personal Goals and Risk Factors at Admission - 01/27/16 1422    Personal Goals and Risk Factors on Admission   Increase Aerobic Exercise and Physical Activity Yes  Khayree utilizes the Treadmill and weight machine at his residence. He tries 3-5 days a week if he is feeling well. His goal is to stay active.  He has recently had concerns with "dizziness" and he was advised to talk to his PMD about the symptoms.    Intervention While in program, learn and follow the exercise prescription taught. Start at a low level workload and increase workload after able to maintain previous level for 30 minutes. Increase time before increasing intensity.   Understand more about Heart/Pulmonary Disease. Yes  Bolivar came to the program with a diagnosis of Asthma and Sleep apnea.  Terron  voiced interest in learning more about the disease process and what he can do to keep it controlled.      Intervention While in program utilize professionals for any questions, and attend the education sessions. Great websites to use are www.americanheart.org or www.lung.org for reliable information.  Lucien has stoppped using the CPAP,because he tried every method available to use the machine and he did not tolerate any method.   Improve shortness of breath with ADL's Yes  Ziair scored some fear with SOB, he stated he has SOB with exertion, that has learned to space his activities to be less SOB   Intervention While in program, learn and follow the exercise prescription taught. Start at a low level workload and increase workload ad advised by the exercise physiologist. Increase time before increasing intensity.   Develop more efficient breathing techniques such as purse lipped breathing and diaphragmatic breathing; and practicing self-pacing with activity Yes  Saunders instructed on purse lipped breathing and he had great return demonstration.   Intervention While in program, learn and utilize the specific breathing techniques taught to you. Continue to practice and use the techniques as needed.   Increase knowledge of respiratory medications and ability  to use respiratory devices properly.  Yes  Cedrick has ventolin as needed, new nebuliser to use. Symbicort inhaler also.  Kross did not have a spacer, will provide him with one and instruction for proper use next visit.   Intervention While in program learn and demonstrate appropriate use of your oxygen therapy by increasing flow with exertion, manage oxygen tank operation, including continuous and intermittent flow.  Understanding oxygen is a drug ordered by your physician.   Hypertension Yes   Goal Participant will see blood pressure controlled within the values of 140/4m/Hg or within value directed by their physician.   Intervention Provide nutrition &  aerobic exercise along with prescribed medications to achieve BP 140/90 or less.   Lipids Yes   Goal Cholesterol controlled with medications as prescribed, with individualized exercise RX and with personalized nutrition plan. Value goals: LDL < 754m HDL > 4082mParticipant states understanding of desired cholesterol values and following prescriptions.   Intervention Provide nutrition & aerobic exercise along with prescribed medications to achieve LDL <5m43mDL >40mg56mPersonal Goal Other Yes   Personal Goal Stay Active   Intervention Other  Follow exercise prescription guidelines to gradually increase activity levels without increase in SOB.       Personal Goals and Risk Factors Review:      Goals and Risk Factor Review      02/03/16 1000           Breathing Techniques   Goals Progress/Improvement seen  Yes       Comments Reviewed PLB with Mr FauceWilenskyencouraged him to use this technique with his exercise goals.          Personal Goals Discharge (Final Personal Goals and Risk Factors Review):      Goals and Risk Factor Review - 02/03/16 1000    Breathing Techniques   Goals Progress/Improvement seen  Yes   Comments Reviewed PLB with Mr FauceTaltonencouraged him to use this technique with his exercise goals.      ITP Comments:     ITP Comments      01/27/16 1409 01/27/16 1411 02/03/16 1000 02/03/16 1053 02/05/16 1056   ITP Comments Today Dolan Barnabas Listernded his orientation. Program and personal goals were reviewed with Suhaas.Barnabas Listerpectationis that Shashank will meet his goals at the end of the 36 session. Review will be done during his program to set short term goals as needed.  At the end of the 6 minute walk, Hairo Nidalrienced an increase in his "dizziness" . This got better after a couple cups of water and sitting, but did not totally resolve. after discussion with jacj, I [facility]. Stan Carol be evaluated and treated  at the clinic. BP was fine, Blood sugar check  was 95ml/32m Mr FaucetPhenixeted his first exercise day in [facility]. He reached his goals and did very well and will progress in the program. Eusebio iBlaizepected to meet his goals by end of the program in 35 sessions.  Personal and exercise goals expected to be met in 33 more sessions. Progress on specific individualized goals will be charted in patient's ITP. Upon completion of the program the patient will be comfortable managing exercise goals and progression on their own.      02/08/16 1057           ITP Comments Personal and exercise goals expected to be met in 32 more  sessions. Progress on specific individualized goals will be charted in patient's ITP. Upon completion of the program the patient will be comfortable managing exercise goals and progression on their own.           Comments: 30 day note review

## 2016-02-08 NOTE — Addendum Note (Signed)
Addended by: Karie Fetch on: 02/08/2016 12:21 PM   Modules accepted: Orders

## 2016-02-09 ENCOUNTER — Ambulatory Visit (INDEPENDENT_AMBULATORY_CARE_PROVIDER_SITE_OTHER): Payer: Medicare Other | Admitting: Urology

## 2016-02-09 ENCOUNTER — Encounter: Payer: Self-pay | Admitting: Urology

## 2016-02-09 VITALS — BP 138/72 | HR 77 | Temp 97.5°F | Resp 16 | Ht 66.0 in | Wt 179.0 lb

## 2016-02-09 DIAGNOSIS — R351 Nocturia: Secondary | ICD-10-CM

## 2016-02-09 DIAGNOSIS — N401 Enlarged prostate with lower urinary tract symptoms: Secondary | ICD-10-CM | POA: Diagnosis not present

## 2016-02-09 DIAGNOSIS — N138 Other obstructive and reflux uropathy: Secondary | ICD-10-CM

## 2016-02-09 LAB — BLADDER SCAN AMB NON-IMAGING: Scan Result: 14

## 2016-02-09 NOTE — Progress Notes (Signed)
10:50 AM   Wesley Newton 05-20-1927 [id number]  Referring provider: Madelyn Brunner, MD [place] [facility] [place], [place] [id number]  Chief Complaint  Patient presents with  . Benign Prostatic Hypertrophy    HPI: Patient is an 82 year old Caucasian male with a history of nocturia and BPH with LUTS who presents today after a one-month trial for Myrbetriq 25 mg nightly for his nocturia..  Nocturia Patient is experiencing nocturia up to 2-3 times nightly. He finds it very bothersome and interrupting to his sleep. He is suffering from pulmonary fibrosis. He has undergone a sleep study and was not found to have sleep apnea.  He was started on Myrbetriq 25 mg at his last appointment.  He saw no benefit from taking the medication.     BPH WITH LUTS His IPSS score today is 11, which is moderate lower urinary tract symptomatology. He is mostly satisfied with his quality life due to his urinary symptoms. His PVR is 14 mL.   He denies any dysuria, hematuria or suprapubic pain.   He currently taking tamsulosin and finasteride.   His has had prostate biopsies in the remote past and no cancer was identified.  His most recent PSA is 1.4 ng/mL on 12/2014.  He also denies any recent fevers, chills, nausea or vomiting.  He does not have a family history of PCa.      IPSS      01/06/16 1000 02/09/16 1000     International Prostate Symptom Score   How often have you had the sensation of not emptying your bladder? Less than 1 in 5 Less than half the time    How often have you had to urinate less than every two hours? Less than 1 in 5 times Less than 1 in 5 times    How often have you found you stopped and started again several times when you urinated? Less than half the time Less than half the time    How often have you found it difficult to postpone urination? Less than 1 in 5 times Less than 1 in 5 times    How often have you had a weak urinary stream? Almost always  About half the time    How often have you had to strain to start urination? Not at All Not at All    How many times did you typically get up at night to urinate? 3 Times 2 Times    Total IPSS Score 13 11    Quality of Life due to urinary symptoms   If you were to spend the rest of your life with your urinary condition just the way it is now how would you feel about that?  Mostly Satisfied       Score:  1-7 Mild 8-19 Moderate 20-35 Severe     PMH: Past Medical History  Diagnosis Date  . Cancer ([place])   . DVT of leg (deep venous thrombosis) ([place])   . Emphysema lung ([place])   . Elevated PSA   . BPH (benign prostatic hyperplasia)   . COPD (chronic obstructive pulmonary disease) ([place])   . Nocturia   . Pulmonary fibrosis [facility])     Surgical History: Past Surgical History  Procedure Laterality Date  . Cataract extraction    . Hernia repair    . Tonsillectomy      Home Medications:    Medication List       This list  is accurate as of: 02/09/16 10:50 AM.  Always use your most recent med list.               albuterol 108 (90 Base) MCG/ACT inhaler  Commonly known as:  PROVENTIL HFA;VENTOLIN HFA  Inhale 2 puffs into the lungs every 6 (six) hours as needed for wheezing or shortness of breath.     albuterol (2.5 MG/3ML) 0.083% nebulizer solution  Commonly known as:  PROVENTIL  Take 2.5 mg by nebulization every 6 (six) hours as needed for wheezing or shortness of breath.     ALLERGY 10 MG dissolvable tablet  Generic drug:  loratadine  Take by mouth.     aspirin EC 81 MG tablet  Take 81 mg by mouth daily.     finasteride 5 MG tablet  Commonly known as:  PROSCAR  Take 1 tablet (5 mg total) by mouth daily.     fluticasone 50 MCG/ACT nasal spray  Commonly known as:  FLONASE  Place into the nose.     ipratropium-albuterol 0.5-2.5 (3) MG/3ML Soln  Commonly known as:  DUONEB  Inhale into the lungs.     naproxen 375 MG tablet  Commonly known as:  NAPROSYN  Take 375  mg by mouth 3 (three) times daily as needed for mild pain.     omeprazole 40 MG capsule  Commonly known as:  PRILOSEC  Take 40 mg by mouth daily.     PRESERVISION AREDS 2 PO  Take 1 capsule by mouth 2 (two) times daily.     simvastatin 40 MG tablet  Commonly known as:  ZOCOR  Take 40 mg by mouth at bedtime.     SYMBICORT 160-4.5 MCG/ACT inhaler  Generic drug:  budesonide-formoterol  Inhale 2 puffs into the lungs 2 (two) times daily.     tamsulosin 0.4 MG Caps capsule  Commonly known as:  FLOMAX  Take 1 capsule (0.4 mg total) by mouth daily.     vitamin B-12 1000 MCG tablet  Commonly known as:  CYANOCOBALAMIN  Take 1,000 mcg by mouth daily.        Allergies:  Allergies  Allergen Reactions  . Codeine Nausea And Vomiting  . Rapaflo [Silodosin] Other (See Comments)    Reaction:  Unknown   . Tramadol Nausea And Vomiting and Other (See Comments)    Pt states that this medication makes him feel crazy.      Family History: Family History  Problem Relation Age of Onset  . Kidney disease Neg Hx   . Prostate cancer Neg Hx     Social History:  reports that he has quit smoking. He does not have any smokeless tobacco history on file. He reports that he does not drink alcohol or use illicit drugs.  ROS: UROLOGY Frequent Urination?: Yes Hard to postpone urination?: No Burning/pain with urination?: No Get up at night to urinate?: Yes Leakage of urine?: No Urine stream starts and stops?: No Trouble starting stream?: No Do you have to strain to urinate?: No Blood in urine?: No Urinary tract infection?: No Sexually transmitted disease?: No Injury to kidneys or bladder?: No Painful intercourse?: No Weak stream?: Yes Erection problems?: Yes Penile pain?: No  Gastrointestinal Nausea?: No Vomiting?: No Indigestion/heartburn?: No Diarrhea?: No Constipation?: No  Constitutional Fever: No Night sweats?: No Weight loss?: No Fatigue?: Yes  Skin Skin rash/lesions?:  Yes Itching?: Yes  Eyes Blurred vision?: Yes Double vision?: No  Ears/Nose/Throat Sore throat?: No Sinus problems?: No  Hematologic/Lymphatic Swollen glands?:  No Easy bruising?: Yes  Cardiovascular Leg swelling?: No Chest pain?: No  Respiratory Cough?: Yes Shortness of breath?: Yes  Endocrine Excessive thirst?: No  Musculoskeletal Back pain?: No Joint pain?: Yes  Neurological Headaches?: No Dizziness?: Yes  Psychologic Depression?: No Anxiety?: Yes  Physical Exam: BP 138/72 mmHg  Pulse 77  Temp(Src) 97.5 F (36.4 C)  Resp 16  Ht 5\' 6"  (1.676 m)  Wt 179 lb (81.194 kg)  BMI 28.91 kg/m2  Constitutional: Well nourished. Alert and oriented, No acute distress. HEENT: [place] AT, moist mucus membranes. Trachea midline, no masses. Cardiovascular: No clubbing, cyanosis, or edema. Respiratory: Normal respiratory effort, no increased work of breathing. Skin: No rashes, bruises or suspicious lesions. Lymph: No cervical or inguinal adenopathy. Neurologic: Grossly intact, no focal deficits, moving all 4 extremities. Psychiatric: Normal mood and affect.  Laboratory Data: Lab Results  Component Value Date   WBC 12.4* 01/05/2016   HGB 13.6 01/05/2016   HCT 42.2 01/05/2016   MCV 86.2 01/05/2016   PLT 279 01/05/2016    Lab Results  Component Value Date   CREATININE 1.24 01/05/2016     Lab Results  Component Value Date   AST 25 08/02/2015   Lab Results  Component Value Date   ALT 16* 08/02/2015    Pertinent Imaging: Results for EMMANUAL, RAGANS (MRN [id number]) as of 02/09/2016 16:51  Ref. Range 02/09/2016 10:30  Scan Result Unknown 14 mL     Assessment & Plan:    1. Nocturia:   Patient did not the Myrbetriq 25 mg helpful in curving his nocturia. He does not want to start another medication at this time. He would like to revisit this issue when he returns in 1 year for a follow-up appointment.  2.  BPH with LUTS:   Patient's I PSS score was 11/2.  His  most irritative symptom is nocturia.  He will treat the nocturia conservatively at this time. He will continue the tamsulosin and finasteride.  He will RTC in one year for an IPSS score and PVR.    Return in about 1 year (around 02/08/2017) for IPSS score and exam.  These notes generated with voice recognition software. I apologize for typographical errors.  Zara Council, [facility] [address] [place], [place] [address] [phone]

## 2016-02-10 ENCOUNTER — Encounter: Payer: Medicare Other | Admitting: *Deleted

## 2016-02-10 DIAGNOSIS — J45998 Other asthma: Secondary | ICD-10-CM | POA: Diagnosis not present

## 2016-02-10 DIAGNOSIS — J45909 Unspecified asthma, uncomplicated: Secondary | ICD-10-CM

## 2016-02-10 NOTE — Progress Notes (Signed)
Daily Session Note  Patient Details  Name: Wesley Newton MRN: [id number] Date of Birth: 07-20-1927 Referring Provider:  Erby Pian, MD  Encounter Date: 02/10/2016  Check In:     Session Check In - 02/10/16 1106    Check-In   Location [facility]   Staff Present Candiss Norse, MS, ACSM CEP, Exercise Physiologist;Steven Way, BS, ACSM EP-C, Exercise Physiologist;Laureen Owens Shark, BS, RRT, Respiratory Therapist   Supervising physician immediately available to respond to emergencies [facility] immediately available ER MD   Physician(s) Owens Shark and Archie Balboa   Medication changes reported     No   Fall or balance concerns reported    No   Warm-up and Cool-down Performed on first and last piece of equipment   Resistance Training Performed Yes   VAD Patient? No   Pain Assessment   Currently in Pain? No/denies   Multiple Pain Sites No           Exercise Prescription Changes - 02/10/16 1100    Exercise Review   Progression Yes   Response to Exercise   Symptoms None   Comments Morrie continues to increase in stamina and has progressed on the TM which is his most difficult machine. He reports feeling more confident in his walking and has noticed the improvement    Duration Progress to 30 minutes of continuous aerobic without signs/symptoms of physical distress   Intensity Rest + 30   Progression Continue progressive overload as per policy without signs/symptoms or physical distress.   Resistance Training   Training Prescription Yes   Weight 2   Reps 10-12   Treadmill   MPH 2   Grade 0   Minutes 12   Recumbant Bike   Level 3   RPM 60   Minutes 15   NuStep   Level 4   [place] 25   Minutes 12      Goals Met:  Proper associated with RPD/PD & O2 Sat Independence with exercise equipment Using PLB without cueing & demonstrates good technique Exercise tolerated well Personal goals reviewed Strength training completed today  Goals Unmet:  Not  Applicable  Goals Comments: Patient completed exercise prescription and all exercise goals during rehab session. The exercise was tolerated well and the patient is progressing in the program.    Dr. Emily Filbert is Medical Director for [place] and [facility].

## 2016-02-12 ENCOUNTER — Encounter: Payer: Medicare Other | Admitting: *Deleted

## 2016-02-12 DIAGNOSIS — G4730 Sleep apnea, unspecified: Secondary | ICD-10-CM

## 2016-02-12 DIAGNOSIS — J45998 Other asthma: Secondary | ICD-10-CM | POA: Diagnosis not present

## 2016-02-12 DIAGNOSIS — J45909 Unspecified asthma, uncomplicated: Secondary | ICD-10-CM

## 2016-02-12 NOTE — Progress Notes (Signed)
Daily Session Note  Patient Details  Name: Wesley Newton MRN: [id number] Date of Birth: 1927-11-30 Referring Provider:  Erby Pian, MD  Encounter Date: 02/12/2016  Check In:     Session Check In - 02/12/16 1058    Check-In   Location [facility]   Staff Present Heath Lark, RN, BSN, CCRP;Laureen Owens Shark, BS, RRT, Respiratory Therapist;Thong Feeny, RN, Drusilla Kanner, MS, ACSM CEP, Exercise Physiologist   Supervising physician immediately available to respond to emergencies [facility] immediately available ER MD   Physician(s) Dr. Marcelene Butte and Dr. Jimmye Norman   Medication changes reported     No   Fall or balance concerns reported    No   Warm-up and Cool-down Performed on first and last piece of equipment   Resistance Training Performed Yes   VAD Patient? No   Pain Assessment   Currently in Pain? No/denies         Goals Met:  Proper associated with RPD/PD & O2 Sat Exercise tolerated well  Goals Unmet:  Not Applicable  Goals Comments:    Dr. Emily Filbert is Medical Director for [place] and [facility].

## 2016-02-15 ENCOUNTER — Encounter: Payer: Medicare Other | Admitting: *Deleted

## 2016-02-15 DIAGNOSIS — J45998 Other asthma: Secondary | ICD-10-CM | POA: Diagnosis not present

## 2016-02-15 DIAGNOSIS — J45909 Unspecified asthma, uncomplicated: Secondary | ICD-10-CM

## 2016-02-15 DIAGNOSIS — G4730 Sleep apnea, unspecified: Secondary | ICD-10-CM

## 2016-02-15 NOTE — Progress Notes (Signed)
Daily Session Note  Patient Details  Name: Wesley Newton MRN: [id number] Date of Birth: Aug 27, 1927 Referring Provider:  Erby Pian, MD  Encounter Date: 02/15/2016  Check In:     Session Check In - 02/15/16 1038    Check-In   Location [facility]   Staff Present Lestine Box, BS, ACSM EP-C, Exercise Physiologist;Carlisa Eble Amedeo Plenty, BS, ACSM CEP, Exercise Physiologist;Laureen Owens Shark, BS, RRT, Respiratory Therapist   Supervising physician immediately available to respond to emergencies [facility] immediately available ER MD   Physician(s) Dr. Joni Fears and Dr.Schaevitz   Medication changes reported     No   Fall or balance concerns reported    No   Warm-up and Cool-down Performed on first and last piece of equipment   Resistance Training Performed Yes   VAD Patient? No   Pain Assessment   Currently in Pain? No/denies   Multiple Pain Sites No           Exercise Prescription Changes - 02/15/16 1000    Exercise Review   Progression Yes   Response to Exercise   Symptoms None   Comments Linsey continues to increase in stamina and has progressed on the TM which is his most difficult machine. Smiley increased his time on the TM. These changes were reviewed with him and he tolerated this increase with no signs or symptoms.    Duration Progress to 30 minutes of continuous aerobic without signs/symptoms of physical distress   Intensity Rest + 30   Progression Continue progressive overload as per policy without signs/symptoms or physical distress.   Resistance Training   Training Prescription Yes   Weight 3   Reps 10-12   Treadmill   MPH 2   Grade 0   Minutes 15   Recumbant Bike   Level 3   RPM 60   Minutes 15   NuStep   Level 4   [place] 25   Minutes 15      Goals Met:  Proper associated with RPD/PD & O2 Sat Independence with exercise equipment Exercise tolerated well Personal goals reviewed Strength training completed today  Goals Unmet:  Not  Applicable  Goals Comments: Reviewed individualized exercise prescription and made increases per departmental policy. Exercise increases were discussed with the patient and they were able to perform the new work loads without issue (no signs or symptoms).     Dr. Emily Filbert is Medical Director for [place] and [facility].

## 2016-02-17 ENCOUNTER — Encounter: Payer: Medicare Other | Attending: Specialist

## 2016-02-17 DIAGNOSIS — N400 Enlarged prostate without lower urinary tract symptoms: Secondary | ICD-10-CM | POA: Insufficient documentation

## 2016-02-17 DIAGNOSIS — Z87891 Personal history of nicotine dependence: Secondary | ICD-10-CM | POA: Insufficient documentation

## 2016-02-17 DIAGNOSIS — J45998 Other asthma: Secondary | ICD-10-CM | POA: Diagnosis not present

## 2016-02-17 DIAGNOSIS — G4730 Sleep apnea, unspecified: Secondary | ICD-10-CM

## 2016-02-17 DIAGNOSIS — J449 Chronic obstructive pulmonary disease, unspecified: Secondary | ICD-10-CM | POA: Insufficient documentation

## 2016-02-17 DIAGNOSIS — J45909 Unspecified asthma, uncomplicated: Secondary | ICD-10-CM

## 2016-02-17 NOTE — Progress Notes (Signed)
Daily Session Note  Patient Details  Name: Wesley Newton MRN: [id number] Date of Birth: December 17, 1927 Referring Provider:  Erby Pian, MD  Encounter Date: 02/17/2016  Check In:     Session Check In - 02/17/16 1123    Check-In   Location [facility]   Staff Present Heath Lark, RN, BSN, CCRP;Laureen Owens Shark, BS, RRT, Respiratory Therapist;Ellington Greenslade, BS, ACSM EP-C, Exercise Physiologist   Supervising physician immediately available to respond to emergencies [facility] immediately available ER MD   Physician(s) Jacqualine Code and McShane   Medication changes reported     No   Fall or balance concerns reported    No   Warm-up and Cool-down Performed on first and last piece of equipment   Resistance Training Performed No   VAD Patient? No   Pain Assessment   Currently in Pain? No/denies         Goals Met:  Proper associated with RPD/PD & O2 Sat Exercise tolerated well No report of cardiac concerns or symptoms Strength training completed today  Goals Unmet:  Not Applicable  Comments: Wen is doing well, feels like exercise is improving.   Dr. Emily Filbert is Medical Director for [place] and [facility].

## 2016-02-19 ENCOUNTER — Encounter: Payer: Medicare Other | Admitting: *Deleted

## 2016-02-19 DIAGNOSIS — G4730 Sleep apnea, unspecified: Secondary | ICD-10-CM

## 2016-02-19 DIAGNOSIS — J45909 Unspecified asthma, uncomplicated: Secondary | ICD-10-CM

## 2016-02-19 DIAGNOSIS — J45998 Other asthma: Secondary | ICD-10-CM | POA: Diagnosis not present

## 2016-02-19 NOTE — Progress Notes (Signed)
Daily Session Note  Patient Details  Name: Wesley Newton MRN: [id number] Date of Birth: Oct 21, 1927 Referring Provider:  Erby Pian, MD  Encounter Date: 02/19/2016  Check In:     Session Check In - 02/19/16 1110    Check-In   Staff Present Heath Lark, RN, BSN, CCRP;Stacey Blanch Media, RRT, RCP, Respiratory Therapist;Carmelite Violet, RN, Alex Gardener, PT   Supervising physician immediately available to respond to emergencies [facility] immediately available ER MD   Physician(s) Dr. Archie Balboa and Dr. Reita Cliche   Medication changes reported     No   Fall or balance concerns reported    No   Warm-up and Cool-down Performed on first and last piece of equipment   Resistance Training Performed Yes   VAD Patient? No   Pain Assessment   Currently in Pain? No/denies           Exercise Prescription Changes - 02/19/16 1100    Exercise Review   Progression Yes   Response to Exercise   Symptoms None   Comments [name] continues to increase in stamina and has progressed on the TM which is his most difficult machine. [name] increased his time on the TM. These changes were reviewed with him and he tolerated this increase with no signs or symptoms.    Duration Progress to 30 minutes of continuous aerobic without signs/symptoms of physical distress   Intensity Rest + 30   Progression   Progression Continue progressive overload as per policy without signs/symptoms or physical distress.   Treadmill   MPH 2.2      Goals Met:  Proper associated with RPD/PD & O2 Sat Exercise tolerated well  Goals Unmet:  Not Applicable  Comments:    Dr. Emily Filbert is Medical Director for [place] and [facility].

## 2016-02-22 ENCOUNTER — Encounter: Payer: Medicare Other | Admitting: *Deleted

## 2016-02-22 DIAGNOSIS — G4730 Sleep apnea, unspecified: Secondary | ICD-10-CM

## 2016-02-22 DIAGNOSIS — J45998 Other asthma: Secondary | ICD-10-CM | POA: Diagnosis not present

## 2016-02-22 DIAGNOSIS — J45909 Unspecified asthma, uncomplicated: Secondary | ICD-10-CM

## 2016-02-22 NOTE — Progress Notes (Signed)
Daily Session Note  Patient Details  Name: Wesley Newton MRN: [id number] Date of Birth: 03/05/1927 Referring Provider:  Fleming, Herbon E, MD  Encounter Date: 02/22/2016  Check In:     Session Check In - 02/22/16 1115    Check-In   Location [facility]   Staff Present Laureen Brown, BS, RRT, Respiratory Therapist;Steven Way, BS, ACSM EP-C, Exercise Physiologist;Kelly Hayes, BS, ACSM CEP, Exercise Physiologist   Supervising physician immediately available to respond to emergencies [facility] immediately available ER MD   Physician(s) Dr. Schaevitz and Dr. Paduchowski   Medication changes reported     No   Fall or balance concerns reported    No   Warm-up and Cool-down Performed on first and last piece of equipment   Resistance Training Performed Yes   VAD Patient? No   Pain Assessment   Currently in Pain? No/denies   Multiple Pain Sites No           Exercise Prescription Changes - 02/22/16 1100    Exercise Review   Progression Yes   Response to Exercise   Symptoms None   Comments [name] continues to increase in stamina and has progressed on the TM which is his most difficult machine. [name] increased his time on the TM. These changes were reviewed with him and he tolerated this increase with no signs or symptoms.    Duration Progress to 30 minutes of continuous aerobic without signs/symptoms of physical distress   Intensity Rest + 30   Progression   Progression Continue progressive overload as per policy without signs/symptoms or physical distress.   Treadmill   MPH 2.2   Grade 0   Minutes 15   Recumbant Bike   Level 4   RPM 60   [place] 15   NuStep   Level 5   [place] 50   Minutes 15      Goals Met:  Proper associated with RPD/PD & O2 Sat Independence with exercise equipment Exercise tolerated well Personal goals reviewed Strength training completed today  Goals Unmet:  Not Applicable  Comments: Reviewed individualized exercise prescription  and made increases per departmental policy. Exercise increases were discussed with the patient and they were able to perform the new work loads without issue (no signs or symptoms).     Dr. Mark Miller is Medical Director for [facility] and [facility]. 

## 2016-02-24 ENCOUNTER — Encounter: Payer: Medicare Other | Admitting: *Deleted

## 2016-02-24 DIAGNOSIS — J45998 Other asthma: Secondary | ICD-10-CM | POA: Diagnosis not present

## 2016-02-24 DIAGNOSIS — J45909 Unspecified asthma, uncomplicated: Secondary | ICD-10-CM

## 2016-02-24 NOTE — Progress Notes (Signed)
Daily Session Note  Patient Details  Name: Wesley Newton MRN: [id number] Date of Birth: 1927/06/10 Referring Provider:  Erby Pian, MD  Encounter Date: 02/24/2016  Check In:     Session Check In - 02/24/16 1101    Check-In   Location [facility]   Staff Present Candiss Norse, MS, ACSM CEP, Exercise Physiologist;Laureen Owens Shark, BS, RRT, Respiratory Therapist;Steven Way, BS, ACSM EP-C, Exercise Physiologist   Supervising physician immediately available to respond to emergencies [facility] immediately available ER MD   Physician(s) Edd Fabian and Cinda Quest   Medication changes reported     No   Fall or balance concerns reported    No   Warm-up and Cool-down Performed on first and last piece of equipment   Resistance Training Performed Yes   VAD Patient? No   Pain Assessment   Multiple Pain Sites No         Goals Met:  Proper associated with RPD/PD & O2 Sat Independence with exercise equipment Using PLB without cueing & demonstrates good technique Exercise tolerated well Strength training completed today  Goals Unmet:  Not Applicable  Comments: Patient completed exercise prescription and all exercise goals during rehab session. The exercise was tolerated well and the patient is progressing in the program.    Dr. Emily Filbert is Medical Director for [place] and [facility].

## 2016-02-29 ENCOUNTER — Encounter: Payer: Medicare Other | Admitting: *Deleted

## 2016-02-29 DIAGNOSIS — J45909 Unspecified asthma, uncomplicated: Secondary | ICD-10-CM

## 2016-02-29 DIAGNOSIS — J45998 Other asthma: Secondary | ICD-10-CM | POA: Diagnosis not present

## 2016-02-29 NOTE — Progress Notes (Signed)
Daily Session Note  Patient Details  Name: Wesley Newton MRN: [id number] Date of Birth: Sep 12, 1927 Referring Provider:  Erby Pian, MD  Encounter Date: 02/29/2016  Check In:     Session Check In - 02/29/16 1110    Check-In   Location [facility]   Staff Present Earlean Shawl, BS, ACSM CEP, Exercise Physiologist;Laureen Owens Shark, BS, RRT, Respiratory Therapist;Steven Way, BS, ACSM EP-C, Exercise Physiologist   Supervising physician immediately available to respond to emergencies [facility] immediately available ER MD   Physician(s) Corky Downs and Paduchowski   Medication changes reported     No   Fall or balance concerns reported    No   Warm-up and Cool-down Performed on first and last piece of equipment   Resistance Training Performed Yes   VAD Patient? No   Pain Assessment   Currently in Pain? No/denies   Multiple Pain Sites No         Goals Met:  Proper associated with RPD/PD & O2 Sat Independence with exercise equipment Exercise tolerated well Strength training completed today  Goals Unmet:  Not Applicable  Comments: Patient completed exercise prescription and all exercise goals during rehab session. The exercise was tolerated well and the patient is progressing in the program.     Dr. Emily Filbert is Medical Director for [place] and [facility].

## 2016-03-02 ENCOUNTER — Encounter: Payer: Medicare Other | Admitting: *Deleted

## 2016-03-02 DIAGNOSIS — J45909 Unspecified asthma, uncomplicated: Secondary | ICD-10-CM

## 2016-03-02 DIAGNOSIS — J45998 Other asthma: Secondary | ICD-10-CM | POA: Diagnosis not present

## 2016-03-02 NOTE — Progress Notes (Signed)
Daily Session Note  Patient Details  Name: Wesley Newton MRN: [id number] Date of Birth: 01/19/1927 Referring Provider:  Erby Pian, MD  Encounter Date: 03/02/2016  Check In:     Session Check In - 03/02/16 1038    Check-In   Location [facility]   Staff Present Candiss Norse, MS, ACSM CEP, Exercise Physiologist;Steven Way, BS, ACSM EP-C, Exercise Physiologist;Laureen Owens Shark, BS, RRT, Respiratory Therapist   Supervising physician immediately available to respond to emergencies [facility] immediately available ER MD   Physician(s) Archie Balboa  and Marcelene Butte   Medication changes reported     No   Fall or balance concerns reported    No   Warm-up and Cool-down Performed on first and last piece of equipment   Resistance Training Performed Yes   VAD Patient? No   Pain Assessment   Currently in Pain? No/denies   Multiple Pain Sites No         Goals Met:  Proper associated with RPD/PD & O2 Sat Independence with exercise equipment Using PLB without cueing & demonstrates good technique Exercise tolerated well Strength training completed today  Goals Unmet:  Not Applicable  Comments: Patient completed exercise prescription and all exercise goals during rehab session. The exercise was tolerated well and the patient is progressing in the program.    Dr. Emily Filbert is Medical Director for [place] and [facility].

## 2016-03-04 ENCOUNTER — Encounter: Payer: Medicare Other | Admitting: *Deleted

## 2016-03-04 DIAGNOSIS — J45909 Unspecified asthma, uncomplicated: Secondary | ICD-10-CM

## 2016-03-04 DIAGNOSIS — J45998 Other asthma: Secondary | ICD-10-CM | POA: Diagnosis not present

## 2016-03-04 NOTE — Progress Notes (Signed)
Daily Session Note  Patient Details  Name: Wesley Newton MRN: [id number] Date of Birth: [birth year] Referring Provider:  Madelyn Brunner, MD  Encounter Date: 03/04/2016  Check In:     Session Check In - 03/04/16 1106    Check-In   Location [facility]   Staff Present Candiss Norse, MS, ACSM CEP, Exercise Physiologist;Carroll Enterkin, RN, BSN;Stacey Blanch Media, RRT, RCP, Respiratory Therapist;Susanne Bice, RN, BSN, [employer] physician immediately available to respond to emergencies [facility] immediately available ER MD   Physician(s) Corky Downs and Jimmye Norman   Medication changes reported     No   Fall or balance concerns reported    No   Warm-up and Cool-down Performed on first and last piece of equipment   Resistance Training Performed Yes   VAD Patient? No   Pain Assessment   Currently in Pain? No/denies   Multiple Pain Sites No         Goals Met:  Proper associated with RPD/PD & O2 Sat Independence with exercise equipment Using PLB without cueing & demonstrates good technique Exercise tolerated well Strength training completed today  Goals Unmet:  Not Applicable  Comments: Patient completed exercise prescription and all exercise goals during rehab session. The exercise was tolerated well and the patient is progressing in the program.   Dr. Emily Filbert is Medical Director for [place] and [facility].

## 2016-03-07 ENCOUNTER — Encounter: Payer: Medicare Other | Admitting: *Deleted

## 2016-03-07 DIAGNOSIS — J45909 Unspecified asthma, uncomplicated: Secondary | ICD-10-CM

## 2016-03-07 DIAGNOSIS — G4730 Sleep apnea, unspecified: Secondary | ICD-10-CM

## 2016-03-07 DIAGNOSIS — J45998 Other asthma: Secondary | ICD-10-CM | POA: Diagnosis not present

## 2016-03-07 NOTE — Progress Notes (Signed)
Daily Session Note  Patient Details  Name: Wesley Newton MRN: [id number] Date of Birth: 02-12-1927 Referring Provider:  Erby Pian, MD  Encounter Date: 03/07/2016  Check In:     Session Check In - 03/07/16 1100    Check-In   Location [facility]   Staff Present Earlean Shawl, BS, ACSM CEP, Exercise Physiologist;Steven Way, BS, ACSM EP-C, Exercise Physiologist;Laureen Owens Shark, BS, RRT, Respiratory Therapist   Supervising physician immediately available to respond to emergencies [facility] immediately available ER MD   Physician(s) Owens Shark and Reita Cliche   Medication changes reported     No   Fall or balance concerns reported    No   Warm-up and Cool-down Performed on first and last piece of equipment   Resistance Training Performed Yes   VAD Patient? No   Pain Assessment   Currently in Pain? No/denies   Multiple Pain Sites No           Exercise Prescription Changes - 03/07/16 1100    Exercise Review   Progression Yes   Response to Exercise   Symptoms None   Comments Increases made to the patient's exercise prescription were reviewed with the patient. He tolerated these changes with no signs or sypmtoms.    Duration Progress to 30 minutes of continuous aerobic without signs/symptoms of physical distress   Intensity Rest + 30   Progression   Progression Continue progressive overload as per policy without signs/symptoms or physical distress.   Treadmill   MPH 2.2   Grade 0   Minutes 15   Recumbant Bike   Level 5   RPM 65   Watts 15   NuStep   Level 5   Watts 50   Minutes 15      Goals Met:  Proper associated with RPD/PD & O2 Sat Independence with exercise equipment Exercise tolerated well Personal goals reviewed Strength training completed today  Goals Unmet:  Not Applicable  Comments: Reviewed individualized exercise prescription and made increases per departmental policy. Exercise increases were discussed with the patient and they were  able to perform the new work loads without issue (no signs or symptoms).     Dr. Emily Filbert is Medical Director for [facility] and [facility].

## 2016-03-07 NOTE — Progress Notes (Signed)
Pulmonary Individual Treatment Plan  Patient Details  Name: Wesley Newton MRN: [id number] Date of Birth: Feb 16, 1927 Referring Provider:  Erby Pian, MD  Initial Encounter Date:       Pulmonary Rehab from 01/27/2016 in [facility] Cardiac and Pulmonary Rehab   Date  01/27/16      Visit Diagnosis: Asthma, chronic, unspecified asthma severity, uncomplicated  Sleep apnea  Patient's Home Medications on Admission:  Current outpatient prescriptions:  .  albuterol (PROVENTIL HFA;VENTOLIN HFA) 108 (90 Base) MCG/ACT inhaler, Inhale 2 puffs into the lungs every 6 (six) hours as needed for wheezing or shortness of breath., Disp: , Rfl:  .  albuterol (PROVENTIL) (2.5 MG/3ML) 0.083% nebulizer solution, Take 2.5 mg by nebulization every 6 (six) hours as needed for wheezing or shortness of breath., Disp: , Rfl:  .  aspirin EC 81 MG tablet, Take 81 mg by mouth daily. , Disp: , Rfl:  .  budesonide-formoterol (SYMBICORT) 160-4.5 MCG/ACT inhaler, Inhale 2 puffs into the lungs 2 (two) times daily., Disp: , Rfl:  .  finasteride (PROSCAR) 5 MG tablet, Take 1 tablet (5 mg total) by mouth daily., Disp: 30 tablet, Rfl: 3 .  fluticasone (FLONASE) 50 MCG/ACT nasal spray, Place into the nose., Disp: , Rfl:  .  ipratropium-albuterol (DUONEB) 0.5-2.5 (3) MG/3ML SOLN, Inhale into the lungs., Disp: , Rfl:  .  loratadine (ALLERGY) 10 MG dissolvable tablet, Take by mouth., Disp: , Rfl:  .  Multiple Vitamins-Minerals (PRESERVISION AREDS 2 PO), Take 1 capsule by mouth 2 (two) times daily., Disp: , Rfl:  .  naproxen (NAPROSYN) 375 MG tablet, Take 375 mg by mouth 3 (three) times daily as needed for mild pain. , Disp: , Rfl: 2 .  omeprazole (PRILOSEC) 40 MG capsule, Take 40 mg by mouth daily. , Disp: , Rfl: 4 .  simvastatin (ZOCOR) 40 MG tablet, Take 40 mg by mouth at bedtime. , Disp: , Rfl: 0 .  tamsulosin (FLOMAX) 0.4 MG CAPS capsule, Take 1 capsule (0.4 mg total) by mouth daily., Disp: 30 capsule, Rfl: 1 .  vitamin B-12  (CYANOCOBALAMIN) 1000 MCG tablet, Take 1,000 mcg by mouth daily., Disp: , Rfl:   Past Medical History: Past Medical History  Diagnosis Date  . Cancer ([place])   . DVT of leg (deep venous thrombosis) ([place])   . Emphysema lung ([place])   . Elevated PSA   . BPH (benign prostatic hyperplasia)   . COPD (chronic obstructive pulmonary disease) ([place])   . Nocturia   . Pulmonary fibrosis ([facility])     Tobacco Use: History  Smoking status  . Former Smoker  Smokeless tobacco  . Not on file    Comment: quit 40 years    Labs: Recent Review Flowsheet Data    There is no flowsheet data to display.       ADL UCSD:     ADL UCSD      01/27/16 1136 01/27/16 1406     ADL UCSD   ADL Phase Entry     SOB Score total  60    Rest  0    Walk  1    Stairs  3    Bath  4    Dress  4    Shop  2       Pulmonary Function Assessment:     Pulmonary Function Assessment - 01/27/16 1300    Initial Spirometry Results   FVC% 106 %   FEV1% 134 %   FEV1/FVC Ratio 95.65  Breath   Bilateral Breath Sounds R[place];Basilar;Inspiratory   Shortness of Breath Yes;Limiting activity      Exercise Target Goals:    Exercise Program Goal: Individual exercise prescription set with THRR, safety & activity barriers. Participant demonstrates ability to understand and report RPE using BORG scale, to self-measure pulse accurately, and to acknowledge the importance of the exercise prescription.  Exercise Prescription Goal: Starting with aerobic activity 30 plus minutes a day, 3 days per week for initial exercise prescription. Provide home exercise prescription and guidelines that participant acknowledges understanding prior to discharge.  Activity Barriers & Risk Stratification:     Activity Barriers & Cardiac Risk Stratification - 01/27/16 1205    Activity Barriers & Cardiac Risk Stratification   Activity Barriers Balance Concerns;Shortness of Breath;Deconditioning      6 Minute Walk:     6 Minute Walk       01/27/16 1331       6 Minute Walk   Phase Initial     Distance 1245 feet     Walk Time 6 minutes     # of Rest Breaks 0     MPH 2.4     RPE 12     Perceived Dyspnea  2     Symptoms Yes (comment)     Comments Light headed after walking, blurred vision, short of breath     Resting HR 90 bpm     Resting BP 128/68 mmHg     Max Ex. HR 116 bpm     Max Ex. BP 150/60 mmHg        Initial Exercise Prescription:     Initial Exercise Prescription - 01/27/16 1300    Date of Initial Exercise Prescription   Date 01/27/16   Treadmill   MPH 2   Grade 0   Minutes 10  Use intervals if necessary such as 2, 5 min intervals   Bike   Level 0.4   Watts 12   Minutes 10   Recumbant Bike   Level 3   RPM 40   Watts 25   Minutes 10   NuStep   Level 2   Watts 25   Minutes 10   Arm Ergometer   Level 1   Watts 8   Minutes 10   Arm/Foot Ergometer   Level 4   Watts 12   Minutes 10   Cybex   Level 2   RPM 50   Minutes 10   Recumbant Elliptical   Level 1   RPM 35   Watts 10   Minutes 10   REL-XR   Level 2   Watts 35   Minutes 10   T5 Nustep   Level 1   Watts 10   Minutes 10   Biostep-RELP   Level 2   Watts 15   Minutes 10   Prescription Details   Frequency (times per week) 3   Duration Progress to 30 minutes of continuous aerobic without signs/symptoms of physical distress   Intensity   THRR REST +  30   Ratings of Perceived Exertion 11-13   Perceived Dyspnea 2-4   Progression   Progression Continue progressive overload as per policy without signs/symptoms or physical distress.   Resistance Training   Training Prescription Yes   Weight 2   Reps 10-12      Perform Capillary Blood Glucose checks as needed.  Exercise Prescription Changes:     Exercise Prescription Changes      01/27/16 1100  02/03/16 1200 02/05/16 1000 02/08/16 1000 02/10/16 1100   Exercise Review   Progression   Yes Yes Yes   Response to Exercise   Blood Pressure (Admit) 128/68 mmHg  122/70 mmHg      Blood Pressure (Exercise) 150/60 mmHg 136/60 mmHg      Blood Pressure (Exit) 128/62 mmHg 124/76 mmHg      Heart Rate (Admit) 90 bpm 92 bpm      Heart Rate (Exercise) 116 bpm 111 bpm      Heart Rate (Exit) 96 bpm 104 bpm      Oxygen Saturation (Admit) 96 % 95 %      Oxygen Saturation (Exercise) 90 % 92 %      Oxygen Saturation (Exit) 96 % 97 %      Rating of Perceived Exertion (Exercise)  11      Perceived Dyspnea (Exercise)  3      Symptoms  Heavy cough, he says it is chronic None None None   Comments  First day of exercise! Patient was oriented to the gym and the equipment functions and settings. Procedures and policies of the gym were outlined and explained. The patient's individual exercise prescription and treatment plan were reviewed with them. All starting workloads were established based on the results of the functional testing  done at the initial intake visit. The plan for exercise progression was also introduced and progression will be customized based on the patient's performance and goals Reviewed individualized exercise prescription and made increases per departmental policy. Exercise increases were discussed with the patient and they were able to perform the new work loads without issue (no signs or symptoms).  Wesley Newton's stamina is increasing and this is reflected in longer exercise times. He is not quite exercising for the full class time. Wesley Newton continues to increase in stamina and has progressed on the TM which is his most difficult machine. He reports feeling more confident in his walking and has noticed the improvement    Duration  Progress to 30 minutes of continuous aerobic without signs/symptoms of physical distress Progress to 30 minutes of continuous aerobic without signs/symptoms of physical distress Progress to 30 minutes of continuous aerobic without signs/symptoms of physical distress Progress to 30 minutes of continuous aerobic without signs/symptoms of physical  distress   Intensity  Rest + 30 Rest + 30 Rest + 30 Rest + 30   Progression   Progression  Continue progressive overload as per policy without signs/symptoms or physical distress. Continue progressive overload as per policy without signs/symptoms or physical distress. Continue progressive overload as per policy without signs/symptoms or physical distress. Continue progressive overload as per policy without signs/symptoms or physical distress.   Resistance Training   Training Prescription (read-only)  Yes Yes Yes Yes   Weight (read-only)  _0 Reps (read-only)  10-12 10-12 10-12 10-12   Treadmill   MPH (read-only)  _1 Grade (read-only)  0 0 0 0   Minutes (read-only)  10  5 min intervals x _2 min intervals x _3 min intervals x 2 12   Recumbant Bike   Level (read-only)  _4 RPM (read-only)  60 60 60 60   Minutes (read-only)  _5 NuStep   Level (read-only)  _6 Watts (read-only)  _7 Minutes (read-only)  10 10 12  12     02/12/16 1100 02/15/16 1000 02/19/16 1100 02/22/16 1100 03/07/16 1100   Exercise Review   Progression _0    Response to Exercise   Blood Pressure (Admit)    132/68 mmHg    Blood Pressure (Exercise)    126/74 mmHg    Blood Pressure (Exit)    124/62 mmHg    Heart Rate (Admit)    82 bpm    Heart Rate (Exercise)    96 bpm    Heart Rate (Exit)    105 bpm    Oxygen Saturation (Admit)    96 %    Oxygen Saturation (Exercise)    95 %    Oxygen Saturation (Exit)    98 %    Rating of Perceived Exertion (Exercise)    13    Perceived Dyspnea (Exercise)    3    Symptoms _1    Comments Wesley Newton continues to increase in stamina and has progressed on the TM which is his most difficult machine. He reports feeling more confident in his walking and has noticed the improvement  Wesley Newton continues to increase in stamina and has progressed on the TM which is his most difficult machine. Wesley Newton increased his  time on the TM. These changes were reviewed with him and he tolerated this increase with no signs or symptoms.  Wesley Newton continues to increase in stamina and has progressed on the TM which is his most difficult machine. Wesley Newton increased his time on the TM. These changes were reviewed with him and he tolerated this increase with no signs or symptoms.  Increases made to the patient's exercise prescription were reviewed with the patient. He tolerated these changes with no signs or sypmtoms.  Increases made to the patient's exercise prescription were reviewed with the patient. He tolerated these changes with no signs or sypmtoms.    Duration Progress to 30 minutes of continuous aerobic without signs/symptoms of physical distress Progress to 30 minutes of continuous aerobic without signs/symptoms of physical distress Progress to 30 minutes of continuous aerobic without signs/symptoms of physical distress Progress to 30 minutes of continuous aerobic without signs/symptoms of physical distress Progress to 30 minutes of continuous aerobic without signs/symptoms of physical distress   Intensity Rest + 30 Rest + 30 Rest + 30 Rest + 30 Rest + 30   Progression   Progression Continue progressive overload as per policy without signs/symptoms or physical distress. Continue progressive overload as per policy without signs/symptoms or physical distress. Continue progressive overload as per policy without signs/symptoms or physical distress. Continue progressive overload as per policy without signs/symptoms or physical distress. Continue progressive overload as per policy without signs/symptoms or physical distress.   Resistance Training   Training Prescription (read-only) Yes Yes      Weight (read-only) 2 3      Reps (read-only) 10-12 10-12      Treadmill   MPH   2.2 2.2 2.2   Grade    0 0   Minutes    15 15   MPH (read-only) 2 2      Grade (read-only) 0 0      Minutes (read-only) 12 15      Recumbant Bike   Level    4 5    RPM    60 65   Watts    15 15   Level (read-only) 3 3      RPM (read-only) 60 60  Minutes (read-only) 15 15      NuStep   Level    5 5   Watts    50 50   Minutes    15 15   Level (read-only) 4 4      Watts (read-only) 25 25      Minutes (read-only) 15 15         Exercise Comments:     Exercise Comments      03/07/16 1104           Exercise Comments Reviewed individualized exercise prescription and made increases per departmental policy. Exercise increases were discussed with the patient and they were able to perform the new work loads without issue (no signs or symptoms).           Discharge Exercise Prescription (Final Exercise Prescription Changes):     Exercise Prescription Changes - 03/07/16 1100    Exercise Review   Progression Yes   Response to Exercise   Symptoms None   Comments Increases made to the patient's exercise prescription were reviewed with the patient. He tolerated these changes with no signs or sypmtoms.    Duration Progress to 30 minutes of continuous aerobic without signs/symptoms of physical distress   Intensity Rest + 30   Progression   Progression Continue progressive overload as per policy without signs/symptoms or physical distress.   Treadmill   MPH 2.2   Grade 0   Minutes 15   Recumbant Bike   Level 5   RPM 65   Watts 15   NuStep   Level 5   Watts 50   Minutes 15       Nutrition:  Target Goals: Understanding of nutrition guidelines, daily intake of sodium <1532m, cholesterol <2041m calories 30% from fat and 7% or less from saturated fats, daily to have 5 or more servings of fruits and vegetables.  Biometrics:     Pre Biometrics - 01/27/16 1330    Pre Biometrics   Height 5' 5.75" (1.67 m)   Weight 179 lb (81.194 kg)   Waist Circumference 39 inches   Hip Circumference 42.35 inches   Waist to Hip Ratio 0.92 %   BMI (Calculated) 29.2       Nutrition Therapy Plan and Nutrition Goals:     Nutrition Therapy &  Goals - 01/27/16 1416    Intervention Plan   Intervention (read-only) Using nutrition plan and personal goals to gain a healthy nutrition lifestyle. Add exercise as prescribed.      Nutrition Discharge: Rate Your Plate Scores:   Psychosocial: Target Goals: Acknowledge presence or absence of depression, maximize coping skills, provide positive support system. Participant is able to verbalize types and ability to use techniques and skills needed for reducing stress and depression.  Initial Review & Psychosocial Screening:     Initial Psych Review & Screening - 01/27/16 1420    Initial Review   Current issues with Current Sleep Concerns   Family Dynamics   Good Support System? Yes   Comments [facility]. Several neighbors chaeck on him daily. He has a son and a daughter that he sees weekly and another that lives in [place] He is widowed for 5 years. JaMichaelangeloill benifit from the class participation and the exercise.    Barriers   Psychosocial barriers to participate in program There are no identifiable barriers or psychosocial needs.;The patient should benefit from training in stress management and  relaxation.   Screening Interventions   Interventions Encouraged to exercise      Quality of Life Scores:     Quality of Life - 01/27/16 1558    Quality of Life Scores   Health/Function Pre 10.22 %   Socioeconomic Pre 17.14 %   Psych/Spiritual Pre 19.71 %   Family Pre 24 %   GLOBAL Pre 15.22 %      PHQ-9:     Recent Review Flowsheet Data    Depression screen [facility] 2/9 01/27/2016   Decreased Interest 1   Down, Depressed, Hopeless 1   PHQ - 2 Score 2   Altered sleeping 1   Tired, decreased energy 3   Change in appetite 1   Feeling bad or failure about yourself  0   Trouble concentrating 1   Moving slowly or fidgety/restless 1   Suicidal thoughts 0   PHQ-9 Score 9   Difficult doing work/chores Somewhat difficult       Psychosocial Evaluation and Intervention:     Psychosocial Evaluation - 02/03/16 1045    Psychosocial Evaluation & Interventions   Interventions Encouraged to exercise with the program and follow exercise prescription   Comments Counselor met with Wesley. Zurn today for initial psychosocial evaluation.  He is an 82 year old gentleman who has Pulmonary Fibrosis.  He has a good support system living in a retirement community as well as (2) adult children who live close by and active involvement in his local church community.  Wesley. Horton has some memory issues and struggled a little answering some of  counselor's questions.  He states that he has struggled with depression and anxiety for awhile but is not currently on any medications, although he states the Dr. prescribed something, but his "insurance won't cover it."  He states that his current mood is "okay" but due to his health and some of the medication reactions he has experienced, it "could be better."   He has goals to increase his stamina and strength in this program.  He is sleeping well and has a good appetite.  Wesley. Agyeman plans to supplement working out in this program with exercising in the gym at the retirement community where he resides.  Counselor will continue to follow up with him, especially on his mood.        Psychosocial Re-Evaluation:  Education: Education Goals: Education classes will be provided on a weekly basis, covering required topics. Participant will state understanding/return demonstration of topics presented.  Learning Barriers/Preferences:     Learning Barriers/Preferences - 01/27/16 1206    Learning Barriers/Preferences   Learning Barriers Hearing;Sight   Learning Preferences None      Education Topics: Initial Evaluation Education: - Verbal, written and demonstration of respiratory meds, RPE/PD scales, oximetry and breathing techniques. Instruction on use of nebulizers and MDIs: cleaning and  proper use, rinsing mouth with steroid doses and importance of monitoring MDI activations.          Pulmonary Rehab from 02/29/2016 in [facility] Cardiac and Pulmonary Rehab   Date  01/27/16   Educator  sb   Instruction Review Code  2- meets goals/outcomes      General Nutrition Guidelines/Fats and Fiber: -Group instruction provided by verbal, written material, models and posters to present the general guidelines for heart healthy nutrition. Gives an explanation and review of dietary fats and fiber.   Controlling Sodium/Reading Food Labels: -Group verbal and written material supporting the discussion of sodium use in heart healthy nutrition. Review  and explanation with models, verbal and written materials for utilization of the food label.      Pulmonary Rehab from 02/29/2016 in [facility] Cardiac and Pulmonary Rehab   Date  02/08/16   Educator  SB   Instruction Review Code  2- meets goals/outcomes      Exercise Physiology & Risk Factors: - Group verbal and written instruction with models to review the exercise physiology of the cardiovascular system and associated critical values. Details cardiovascular disease risk factors and the goals associated with each risk factor.   Aerobic Exercise & Resistance Training: - Gives group verbal and written discussion on the health impact of inactivity. On the components of aerobic and resistive training programs and the benefits of this training and how to safely progress through these programs.   Flexibility, Balance, General Exercise Guidelines: - Provides group verbal and written instruction on the benefits of flexibility and balance training programs. Provides general exercise guidelines with specific guidelines to those with heart or lung disease. Demonstration and skill practice provided.      Pulmonary Rehab from 02/29/2016 in [facility] Cardiac and Pulmonary Rehab   Date  02/03/16   Educator  RM   Instruction Review Code  2- meets goals/outcomes       Stress Management: - Provides group verbal and written instruction about the health risks of elevated stress, cause of high stress, and healthy ways to reduce stress.      Pulmonary Rehab from 02/29/2016 in [facility] Cardiac and Pulmonary Rehab   Date  02/10/16   Educator  [facility]   Instruction Review Code  2- meets goals/outcomes      Depression: - Provides group verbal and written instruction on the correlation between heart/lung disease and depressed mood, treatment options, and the stigmas associated with seeking treatment.   Exercise & Equipment Safety: - Individual verbal instruction and demonstration of equipment use and safety with use of the equipment.      Pulmonary Rehab from 02/29/2016 in [facility] Cardiac and Pulmonary Rehab   Date  01/27/16   Educator  Sb   Instruction Review Code  2- meets goals/outcomes      Infection Prevention: - Provides verbal and written material to individual with discussion of infection control including proper hand washing and proper equipment cleaning during exercise session.      Pulmonary Rehab from 02/29/2016 in [facility] Cardiac and Pulmonary Rehab   Date  01/27/16   Educator  SB   Instruction Review Code  2- meets goals/outcomes      Falls Prevention: - Provides verbal and written material to individual with discussion of falls prevention and safety.      Pulmonary Rehab from 02/29/2016 in [facility] Cardiac and Pulmonary Rehab   Date  01/27/16   Educator  SB   Instruction Review Code  2- meets goals/outcomes      Diabetes: - Individual verbal and written instruction to review signs/symptoms of diabetes, desired ranges of glucose level fasting, after meals and with exercise. Advice that pre and post exercise glucose checks will be done for 3 sessions at entry of program.   Chronic Lung Diseases: - Group verbal and written instruction to review new updates, new respiratory medications, new advancements in procedures and treatments. Provide  informative websites and "800" numbers of self-education.      Pulmonary Rehab from 02/29/2016 in [facility] Cardiac and Pulmonary Rehab   Date  02/01/16   Educator  L. Owens Shark, RT   Instruction Review Code  2- meets goals/outcomes  Lung Procedures: - Group verbal and written instruction to describe testing methods done to diagnose lung disease. Review the outcome of test results. Describe the treatment choices: Pulmonary Function Tests, ABGs and oximetry.   Energy Conservation: - Provide group verbal and written instruction for methods to conserve energy, plan and organize activities. Instruct on pacing techniques, use of adaptive equipment and posture/positioning to relieve shortness of breath.   Triggers: - Group verbal and written instruction to review types of environmental controls: home humidity, furnaces, filters, dust mite/pet prevention, HEPA vacuums. To discuss weather changes, air quality and the benefits of nasal washing.   Exacerbations: - Group verbal and written instruction to provide: warning signs, infection symptoms, calling MD promptly, preventive modes, and value of vaccinations. Review: effective airway clearance, coughing and/or vibration techniques. Create an Sports administrator.      Pulmonary Rehab from 02/29/2016 in [facility] Cardiac and Pulmonary Rehab   Date  02/29/16   Educator  LB   Instruction Review Code  2- meets goals/outcomes      Oxygen: - Individual and group verbal and written instruction on oxygen therapy. Includes supplement oxygen, available portable oxygen systems, continuous and intermittent flow rates, oxygen safety, concentrators, and Medicare reimbursement for oxygen.   Respiratory Medications: - Group verbal and written instruction to review medications for lung disease. Drug class, frequency, complications, importance of spacers, rinsing mouth after steroid MDI's, and proper cleaning methods for nebulizers.   AED/CPR: - Group verbal and written  instruction with the use of models to demonstrate the basic use of the AED with the basic ABC's of resuscitation.      Pulmonary Rehab from 02/29/2016 in [facility] Cardiac and Pulmonary Rehab   Date  02/19/16   Educator  CE   Instruction Review Code  2- meets goals/outcomes      Breathing Retraining: - Provides individuals verbal and written instruction on purpose, frequency, and proper technique of diaphragmatic breathing and pursed-lipped breathing. Applies individual practice skills.      Pulmonary Rehab from 02/29/2016 in [facility] Cardiac and Pulmonary Rehab   Date  01/27/16   Educator  sB   Instruction Review Code  2- meets goals/outcomes      Anatomy and Physiology of the Lungs: - Group verbal and written instruction with the use of models to provide basic lung anatomy and physiology related to function, structure and complications of lung disease.   Heart Failure: - Group verbal and written instruction on the basics of heart failure: signs/symptoms, treatments, explanation of ejection fraction, enlarged heart and cardiomyopathy.   Sleep Apnea: - Individual verbal and written instruction to review Obstructive Sleep Apnea. Review of risk factors, methods for diagnosing and types of masks and machines for OSA.   Anxiety: - Provides group, verbal and written instruction on the correlation between heart/lung disease and anxiety, treatment options, and management of anxiety.   Relaxation: - Provides group, verbal and written instruction about the benefits of relaxation for patients with heart/lung disease. Also provides patients with examples of relaxation techniques.      Pulmonary Rehab from 02/29/2016 in [facility]   Date  02/24/16   Educator  [facility]   Instruction Review Code  2- Meets goals/outcomes      Knowledge Questionnaire Score:     Knowledge Questionnaire Score - 01/27/16 1403    Knowledge Questionnaire Score   Pre Score 6/10       Personal Goals  and Risk Factors at Admission:     Personal  Goals and Risk Factors at Admission - 01/27/16 1422    Core Components/Risk Factors/Patient Goals on Admission   Sedentary Yes  Colston utilizes the Treadmill and weight machine at his residence. He tries 3-5 days a week if he is feeling well. His goal is to stay active.  He has recently had concerns with "dizziness" and he was advised to talk to his PMD about the symptoms.    Intervention (read-only) While in program, learn and follow the exercise prescription taught. Start at a low level workload and increase workload after able to maintain previous level for 30 minutes. Increase time before increasing intensity.   Improve shortness of breath with ADL's Yes  Wesley Newton scored some fear with SOB, he stated he has SOB with exertion, that has learned to space his activities to be less SOB   Intervention (read-only) While in program, learn and follow the exercise prescription taught. Start at a low level workload and increase workload ad advised by the exercise physiologist. Increase time before increasing intensity.   Develop more efficient breathing techniques such as purse lipped breathing and diaphragmatic breathing; and practicing self-pacing with activity Yes  Wesley Newton instructed on purse lipped breathing and he had great return demonstration.   Intervention (read-only) While in program, learn and utilize the specific breathing techniques taught to you. Continue to practice and use the techniques as needed.   Increase knowledge of respiratory medications and ability to use respiratory devices properly  Yes  Wesley Newton has ventolin as needed, new nebuliser to use. Symbicort inhaler also.  Wesley Newton did not have a spacer, will provide him with one and instruction for proper use next visit.   Intervention (read-only) While in program learn and demonstrate appropriate use of your oxygen therapy by increasing flow with exertion, manage oxygen tank operation, including continuous  and intermittent flow.  Understanding oxygen is a drug ordered by your physician.   Hypertension Yes   Goal Participant will see blood pressure controlled within the values of 140/42m/Hg or within value directed by their physician.   Intervention (read-only) Provide nutrition & aerobic exercise along with prescribed medications to achieve BP 140/90 or less.   Lipids Yes   Goal Cholesterol controlled with medications as prescribed, with individualized exercise RX and with personalized nutrition plan. Value goals: LDL < 773m HDL > 405mParticipant states understanding of desired cholesterol values and following prescriptions.   Intervention (read-only) Provide nutrition & aerobic exercise along with prescribed medications to achieve LDL <23m30mDL >40mg5mPersonal Goal Other Yes   Personal Goal Stay Active   Intervention Follow exercise prescription guidelines to gradually increase activity levels without increase in SOB.    Understand more about Heart/Pulmonary Disease. Yes  Wesley Newton came to the program with a diagnosis of Asthma and Sleep apnea.  Wesley Newton Zahided interest in learning more about the disease process and what he can do to keep it controlled.      Intervention While in program utilize professionals for any questions, and attend the education sessions. Great websites to use are www.americanheart.org or www.lung.org for reliable information.  Wesley Newton has stoppped using the CPAP,because he tried every method available to use the machine and he did not tolerate any method.      Personal Goals and Risk Factors Review:      Goals and Risk Factor Review      02/03/16 1000 02/22/16 1000 02/29/16 1000       Core Components/Risk Factors/Patient Goals Review   Personal Goals Review  Develop more efficient breathing techniques such as purse lipped breathing and diaphragmatic breathing and practicing self-pacing with activity. Sedentary;Improve shortness of breath with ADL's;Increase knowledge of  respiratory medications and ability to use respiratory devices properly.;Develop more efficient breathing techniques such as purse lipped breathing and diaphragmatic breathing and practicing self-pacing with activity. Increase knowledge of respiratory medications and ability to use respiratory devices properly.     Review  Wesley Newton states he has more energy, although he still has increased shortness of breath; he is using PLB and does find that technique helpful; Gave Wesley Newton a aerochamber for his Ventolin and Symbicort Wesley Newton is experiencing horseness. We discussed that his Symbicort may be causing this, and at his appointment with Dr Raul Del tomorrow, I suggested he mention this to Dr Raul Del.   He could possibly order another inhaler  which would not cause this side effect.     Expected Outcomes  Continue to improve energy and stamina with his exercise goals; follow-up on aerochamber use.      Breathing Techniques (read-only)   Goals Progress/Improvement seen  Yes       Comments Reviewed PLB with Wesley Zappone and encouraged him to use this technique with his exercise goals.          Personal Goals Discharge (Final Personal Goals and Risk Factors Review):      Goals and Risk Factor Review - 02/29/16 1000    Core Components/Risk Factors/Patient Goals Review   Personal Goals Review Increase knowledge of respiratory medications and ability to use respiratory devices properly.   Review Wesley Newton is experiencing horseness. We discussed that his Symbicort may be causing this, and at his appointment with Dr Raul Del tomorrow, I suggested he mention this to Dr Raul Del.   He could possibly order another inhaler  which would not cause this side effect.      ITP Comments:     ITP Comments      01/27/16 1409 01/27/16 1411 02/03/16 1000 02/03/16 1053 02/05/16 1056   ITP Comments Today Wesley Newton attended his orientation. Program and personal goals were reviewed with Wesley Newton.  Expectationis that Wesley Newton will  meet his goals at the end of the 36 session. Review will be done during his program to set short term goals as needed.  At the end of the 6 minute walk, Wesley Newton experienced an increase in his "dizziness" . This got better after a couple cups of water and sitting, but did not totally resolve. after discussion with Wesley Newton, I took him by wheelchair to the [facility] at his primary MD office. Wesley Newton will be evaluated and treated at the clinic. BP was fine, Blood sugar check  was 57m/Dl.  Wesley FLothamercompleted his first exercise day in [facility]. He reached his goals and did very well and will progress in the program. Wesley Newton expected to meet his goals by end of the program in 35 sessions.  Personal and exercise goals expected to be met in 33 more sessions. Progress on specific individualized goals will be charted in patient's ITP. Upon completion of the program the patient will be comfortable managing exercise goals and progression on their own.      02/08/16 1057 02/10/16 1107 02/12/16 1059 02/15/16 1041 02/24/16 1102   ITP Comments Personal and exercise goals expected to be met in 32 more sessions. Progress on specific individualized goals will be charted in patient's ITP. Upon completion of the program the patient will be comfortable managing exercise goals and progression  on their own.  Personal and exercise goals expected to be met in 31 more sessions. Progress on specific individualized goals will be charted in patient's ITP. Upon completion of the program the patient will be comfortable managing exercise goals and progression on their own.  Personal and exercise goals expected to be met in 30 more sessions. Progress on specific individualized goals will be charted in patient's ITP. Upon completion of the program the patient will be comfortable managing exercise goals and progression on their own.  Personal and exercise goals expected to be met in 29 more sessiosns. See ITP for specific notes no goal  progression.  Progress on specific individualized goals will be charted in patient's ITP. Upon completion of the program the patient will be comfortable managing exercise goals and progression on their own.      03/02/16 1452           ITP Comments Wesley Regis was started on an antibiotic by Dr Raul Del and referred to an Ear, Nose, Throat specialist.          Comments: 30 Day Review

## 2016-03-09 ENCOUNTER — Encounter: Payer: Medicare Other | Admitting: *Deleted

## 2016-03-09 DIAGNOSIS — J45909 Unspecified asthma, uncomplicated: Secondary | ICD-10-CM

## 2016-03-09 NOTE — Progress Notes (Signed)
Daily Session Note  Patient Details  Name: Wesley Newton MRN: [id number] Date of Birth: 1927/06/30 Referring Provider:  Erby Pian, MD  Encounter Date: 03/09/2016  Check In:     Session Check In - 03/09/16 1046    Check-In   Location [facility]   Staff Present Candiss Norse, MS, ACSM CEP, Exercise Physiologist;Steven Way, BS, ACSM EP-C, Exercise Physiologist;Laureen Owens Shark, BS, RRT, Respiratory Therapist   Supervising physician immediately available to respond to emergencies [facility] immediately available ER MD   Physician(s) Jimmye Norman and Darl Householder   Medication changes reported     No   Fall or balance concerns reported    No   Warm-up and Cool-down Performed on first and last piece of equipment  Only performed warm up and left after about 10 minutes due to not feeling well.    Resistance Training Performed No  left early    VAD Patient? No   Pain Assessment   Currently in Pain? No/denies   Multiple Pain Sites No         Goals Met:  Wesley Newton was not feeling well and had a bad cough. He tried to exercise, but after 10 minutes he was still feeling poor. It was recommended to him to go home and rest and see his MD if his symptoms persist.   Goals Unmet:  Not Applicable  Comments: See comment above, [name] was not feeling well today and left class early.    Dr. Emily Filbert is Medical Director for [place] and [facility].

## 2016-03-11 ENCOUNTER — Encounter: Payer: Medicare Other | Admitting: *Deleted

## 2016-03-11 DIAGNOSIS — J45998 Other asthma: Secondary | ICD-10-CM | POA: Diagnosis not present

## 2016-03-11 DIAGNOSIS — G4730 Sleep apnea, unspecified: Secondary | ICD-10-CM

## 2016-03-11 DIAGNOSIS — J45909 Unspecified asthma, uncomplicated: Secondary | ICD-10-CM

## 2016-03-11 NOTE — Progress Notes (Signed)
Daily Session Note  Patient Details  Name: TYRONNE BLANN MRN: [id number] Date of Birth: [birth year] Referring Provider:  Erby Pian, MD  Encounter Date: 03/11/2016  Check In:     Session Check In - 03/11/16 1051    Check-In   Location [facility]   Staff Present Heath Lark, RN, BSN, CCRP;Neveyah Garzon, RN, BSN;Stacey Blanch Media, RRT, RCP, Respiratory Therapist   Supervising physician immediately available to respond to emergencies [facility] immediately available ER MD   Physician(s) Carloyn Manner is using his walker all the time now.    Medication changes reported     No   Fall or balance concerns reported    No   Warm-up and Cool-down Performed on first and last piece of equipment   Resistance Training Performed Yes   VAD Patient? No   Pain Assessment   Currently in Pain? No/denies         Goals Met:  Proper associated with RPD/PD & O2 Sat Exercise tolerated well  Goals Unmet:  Not Applicable  Comments:    Dr. Emily Filbert is Medical Director for [place] and [facility].

## 2016-03-14 ENCOUNTER — Encounter: Payer: Medicare Other | Admitting: *Deleted

## 2016-03-14 DIAGNOSIS — J45909 Unspecified asthma, uncomplicated: Secondary | ICD-10-CM

## 2016-03-14 DIAGNOSIS — J45998 Other asthma: Secondary | ICD-10-CM | POA: Diagnosis not present

## 2016-03-14 NOTE — Progress Notes (Signed)
Daily Session Note  Patient Details  Name: Wesley Newton MRN: [id number] Date of Birth: September 17, 1927 Referring Provider:  Erby Pian, MD  Encounter Date: 03/14/2016  Check In:     Session Check In - 03/14/16 1041    Check-In   Location [facility]   Staff Present Earlean Shawl, BS, ACSM CEP, Exercise Physiologist;Laureen Owens Shark, BS, RRT, Respiratory Therapist;Dani Danis Dillard Essex, MS, ACSM CEP, Exercise Physiologist;Steven Way, BS, ACSM EP-C, Exercise Physiologist   Supervising physician immediately available to respond to emergencies [facility] immediately available ER MD   Physician(s) Joni Fears and Jimmye Norman   Medication changes reported     No   Fall or balance concerns reported    No   Warm-up and Cool-down Performed on first and last piece of equipment   Resistance Training Performed Yes   VAD Patient? No   Pain Assessment   Currently in Pain? No/denies   Multiple Pain Sites No           Exercise Prescription Changes - 03/14/16 1000    Exercise Review   Progression Yes   Response to Exercise   Symptoms None   Comments TM speed has been increased to support Jairen's strength and stamina gains and to continue to progress his aerobic capacity via walking.   Duration Progress to 30 minutes of continuous aerobic without signs/symptoms of physical distress   Intensity Rest + 30   Progression   Progression Continue progressive overload as per policy without signs/symptoms or physical distress.   Resistance Training   Training Prescription Yes   Weight 3   Reps 10-12   Treadmill   MPH 2.4   Grade 0   Minutes 15   Recumbant Bike   Level 5   RPM 65   Watts 15   NuStep   Level 5   Watts 50   Minutes 15      Goals Met:  Proper associated with RPD/PD & O2 Sat Independence with exercise equipment Using PLB without cueing & demonstrates good technique Exercise tolerated well Personal goals reviewed Strength training completed today  Goals Unmet:   Not Applicable  Comments: Patient completed exercise prescription and all exercise goals during rehab session. The exercise was tolerated well and the patient is progressing in the program.    Dr. Emily Filbert is Medical Director for [place] and [facility].

## 2016-03-16 ENCOUNTER — Encounter: Payer: Medicare Other | Admitting: *Deleted

## 2016-03-16 DIAGNOSIS — J45909 Unspecified asthma, uncomplicated: Secondary | ICD-10-CM

## 2016-03-16 DIAGNOSIS — J45998 Other asthma: Secondary | ICD-10-CM | POA: Diagnosis not present

## 2016-03-16 NOTE — Progress Notes (Signed)
Daily Session Note  Patient Details  Name: Wesley Newton MRN: [id number] Date of Birth: 02-19-1927 Referring Provider:  Erby Pian, MD  Encounter Date: 03/11/2016  Check In:      Goals Met:  Proper associated with RPD/PD & O2 Sat Independence with exercise equipment Using PLB without cueing & demonstrates good technique Exercise tolerated well Strength training completed today  Goals Unmet:  Not Applicable  Comments: Charting an addendum - ER physicians were misses.   Dr. Emily Filbert is Medical Director for [place] and [facility].

## 2016-03-16 NOTE — Progress Notes (Signed)
Daily Session Note  Patient Details  Name: Wesley Newton MRN: [id number] Date of Birth: [birth year] Referring Provider:  Erby Pian, MD  Encounter Date: 03/16/2016  Check In:     Session Check In - 03/16/16 1110    Check-In   Location [facility]   Staff Present Candiss Norse, MS, ACSM CEP, Exercise Physiologist;Steven Way, BS, ACSM EP-C, Exercise Physiologist;Laureen Owens Shark, BS, RRT, Respiratory Therapist   Supervising physician immediately available to respond to emergencies [facility] immediately available ER MD   Physician(s) Burlene Arnt and Jimmye Norman   Medication changes reported     No   Fall or balance concerns reported    No   Warm-up and Cool-down Performed on first and last piece of equipment   Resistance Training Performed Yes   VAD Patient? No   Pain Assessment   Currently in Pain? No/denies   Multiple Pain Sites No         Goals Met:  Proper associated with RPD/PD & O2 Sat Independence with exercise equipment Using PLB without cueing & demonstrates good technique Exercise tolerated well Strength training completed today  Goals Unmet:  Not Applicable  Comments: Patient completed exercise prescription and all exercise goals during rehab session. The exercise was tolerated well and the patient is progressing in the program.    Dr. Emily Filbert is Medical Director for [place] and [facility].

## 2016-03-18 ENCOUNTER — Encounter: Payer: Medicare Other | Admitting: *Deleted

## 2016-03-18 DIAGNOSIS — J45909 Unspecified asthma, uncomplicated: Secondary | ICD-10-CM

## 2016-03-18 DIAGNOSIS — J45998 Other asthma: Secondary | ICD-10-CM | POA: Diagnosis not present

## 2016-03-18 NOTE — Progress Notes (Signed)
Daily Session Note  Patient Details  Name: Wesley Newton MRN: [id number] Date of Birth: January 22, 1927 Referring Provider:  Erby Pian, MD  Encounter Date: 03/18/2016  Check In:     Session Check In - 03/18/16 1056    Check-In   Location [facility]   Staff Present Frederich Cha, RRT, RCP, Respiratory Therapist;Monta Maiorana Dillard Essex, MS, ACSM CEP, Exercise Physiologist;Carroll Enterkin, RN, BSN   Supervising physician immediately available to respond to emergencies [facility] immediately available ER MD   Physician(s) Edd Fabian and Clearnce Hasten   Medication changes reported     No   Fall or balance concerns reported    No   Warm-up and Cool-down Performed on first and last piece of equipment   Resistance Training Performed Yes   VAD Patient? No   Pain Assessment   Currently in Pain? No/denies   Multiple Pain Sites No           Exercise Prescription Changes - 03/18/16 1000    Exercise Review   Progression Yes   Response to Exercise   Symptoms None   Comments Making steady increases on the TM.    Duration Progress to 30 minutes of continuous aerobic without signs/symptoms of physical distress   Intensity Rest + 30   Progression   Progression Continue progressive overload as per policy without signs/symptoms or physical distress.   Resistance Training   Training Prescription Yes   Weight 3   Reps 10-12   Treadmill   MPH 2.5   Grade 0   Minutes 15   Recumbant Bike   Level 5   RPM 65   Watts 15   NuStep   Level 5   Watts 50   Minutes 15      Goals Met:  Proper associated with RPD/PD & O2 Sat Independence with exercise equipment Using PLB without cueing & demonstrates good technique Exercise tolerated well Personal goals reviewed Strength training completed today  Goals Unmet:  Not Applicable  Comments: Patient completed exercise prescription and all exercise goals during rehab session. The exercise was tolerated well and the patient is  progressing in the program.   Dr. Emily Filbert is Medical Director for [place] and [facility].

## 2016-03-21 ENCOUNTER — Encounter: Payer: Medicare Other | Attending: Specialist | Admitting: *Deleted

## 2016-03-21 DIAGNOSIS — J45909 Unspecified asthma, uncomplicated: Secondary | ICD-10-CM

## 2016-03-21 DIAGNOSIS — J449 Chronic obstructive pulmonary disease, unspecified: Secondary | ICD-10-CM | POA: Insufficient documentation

## 2016-03-21 DIAGNOSIS — Z87891 Personal history of nicotine dependence: Secondary | ICD-10-CM | POA: Insufficient documentation

## 2016-03-21 DIAGNOSIS — N400 Enlarged prostate without lower urinary tract symptoms: Secondary | ICD-10-CM | POA: Insufficient documentation

## 2016-03-21 DIAGNOSIS — J45998 Other asthma: Secondary | ICD-10-CM | POA: Diagnosis not present

## 2016-03-21 DIAGNOSIS — G4730 Sleep apnea, unspecified: Secondary | ICD-10-CM

## 2016-03-21 NOTE — Progress Notes (Signed)
Daily Session Note  Patient Details  Name: Wesley Newton MRN: [id number] Date of Birth: 02/03/1927 Referring Provider:  Erby Pian, MD  Encounter Date: 03/21/2016  Check In:     Session Check In - 03/21/16 1111    Check-In   Location [facility]   Staff Present Earlean Shawl, BS, ACSM CEP, Exercise Physiologist;Steven Way, BS, ACSM EP-C, Exercise Physiologist;Laureen Owens Shark, BS, RRT, Respiratory Therapist   Supervising physician immediately available to respond to emergencies [facility] immediately available ER MD   Physician(s) Corky Downs and Edd Fabian   Medication changes reported     No   Fall or balance concerns reported    No   Warm-up and Cool-down Performed on first and last piece of equipment   Resistance Training Performed Yes   VAD Patient? No   Pain Assessment   Currently in Pain? No/denies   Multiple Pain Sites No         Goals Met:  Proper associated with RPD/PD & O2 Sat Independence with exercise equipment Exercise tolerated well Strength training completed today  Goals Unmet:  Not Applicable  Comments: Patient completed exercise prescription and all exercise goals during rehab session. The exercise was tolerated well and the patient is progressing in the program.     Dr. Emily Filbert is Medical Director for [place] and [facility].

## 2016-03-23 ENCOUNTER — Encounter: Payer: Medicare Other | Admitting: *Deleted

## 2016-03-23 DIAGNOSIS — J45909 Unspecified asthma, uncomplicated: Secondary | ICD-10-CM

## 2016-03-23 DIAGNOSIS — G4730 Sleep apnea, unspecified: Secondary | ICD-10-CM

## 2016-03-23 DIAGNOSIS — J45998 Other asthma: Secondary | ICD-10-CM | POA: Diagnosis not present

## 2016-03-23 NOTE — Progress Notes (Signed)
Daily Session Note  Patient Details  Name: Wesley Newton MRN: [id number] Date of Birth: February 27, 1927 Referring Provider:  Erby Pian, MD  Encounter Date: 03/23/2016  Check In:      Goals Met:  Proper associated with RPD/PD & O2 Sat Exercise tolerated well  Goals Unmet:  Not Applicable  Comments:    Dr. Emily Filbert is Medical Director for [place] and [facility].

## 2016-03-25 ENCOUNTER — Encounter: Payer: Medicare Other | Admitting: *Deleted

## 2016-03-25 DIAGNOSIS — G4730 Sleep apnea, unspecified: Secondary | ICD-10-CM

## 2016-03-25 DIAGNOSIS — J45998 Other asthma: Secondary | ICD-10-CM | POA: Diagnosis not present

## 2016-03-25 DIAGNOSIS — J45909 Unspecified asthma, uncomplicated: Secondary | ICD-10-CM

## 2016-03-25 NOTE — Progress Notes (Signed)
Daily Session Note  Patient Details  Name: Wesley Newton MRN: [id number] Date of Birth: [birth year] Referring Provider:  Erby Pian, MD  Encounter Date: 03/25/2016  Check In:     Session Check In - 03/25/16 1126    Check-In   Location [facility]   Staff Present Heath Lark, RN, BSN, CCRP;Carroll Enterkin, RN, BSN;Stacey Blanch Media, RRT, RCP, Respiratory Therapist   Supervising physician immediately available to respond to emergencies [facility] immediately available ER MD   Physician(s) Drs: Marcelene Butte and Dineen Kid   Medication changes reported     No   Fall or balance concerns reported    No   Warm-up and Cool-down Performed on first and last piece of equipment   Resistance Training Performed Yes   VAD Patient? No   Pain Assessment   Currently in Pain? No/denies   Multiple Pain Sites No           Exercise Prescription Changes - 03/25/16 1100    Exercise Review   Progression Yes   Response to Exercise   Symptoms None   Comments Making steady increases on the TM.    Duration Progress to 30 minutes of continuous aerobic without signs/symptoms of physical distress   Intensity Rest + 30   Progression   Progression Continue progressive overload as per policy without signs/symptoms or physical distress.   Resistance Training   Training Prescription Yes   Weight 3   Reps 10-12   Treadmill   MPH 2.5   Grade 0   Minutes 15   Recumbant Bike   Level 5   RPM 65   Watts 15   NuStep   Level 5   Watts 75   Minutes 15      Goals Met:  Proper associated with RPD/PD & O2 Sat Independence with exercise equipment Exercise tolerated well Personal goals reviewed Strength training completed today  Goals Unmet:  Not Applicable  Comments: Doing well with exercise prescription progression.    Dr. Emily Filbert is Medical Director for [place] and [facility].

## 2016-03-28 ENCOUNTER — Encounter: Payer: Medicare Other | Admitting: *Deleted

## 2016-03-28 DIAGNOSIS — G4730 Sleep apnea, unspecified: Secondary | ICD-10-CM

## 2016-03-28 DIAGNOSIS — J45909 Unspecified asthma, uncomplicated: Secondary | ICD-10-CM

## 2016-03-28 DIAGNOSIS — J45998 Other asthma: Secondary | ICD-10-CM | POA: Diagnosis not present

## 2016-03-28 NOTE — Progress Notes (Signed)
Daily Session Note  Patient Details  Name: Wesley Newton MRN: [id number] Date of Birth: 08-Jan-1927 Referring Provider:  Erby Pian, MD  Encounter Date: 03/28/2016  Check In:     Session Check In - 03/28/16 1032    Check-In   Location [facility]   Staff Present Gerlene Burdock, RN, Moises Blood, BS, ACSM CEP, Exercise Physiologist   Supervising physician immediately available to respond to emergencies [facility] immediately available ER MD   Physician(s) Dr. Mariea Clonts and Dr. Corky Downs   Medication changes reported     No   Fall or balance concerns reported    No   Warm-up and Cool-down Performed on first and last piece of equipment   Resistance Training Performed Yes   VAD Patient? No   Pain Assessment   Currently in Pain? No/denies         Goals Met:  Proper associated with RPD/PD & O2 Sat Exercise tolerated well  Goals Unmet:  Not Applicable  Comments:    Dr. Emily Filbert is Medical Director for [place] and [facility].

## 2016-03-30 ENCOUNTER — Encounter: Payer: Medicare Other | Admitting: *Deleted

## 2016-03-30 ENCOUNTER — Other Ambulatory Visit: Payer: Self-pay

## 2016-03-30 DIAGNOSIS — J45998 Other asthma: Secondary | ICD-10-CM | POA: Diagnosis not present

## 2016-03-30 DIAGNOSIS — J45909 Unspecified asthma, uncomplicated: Secondary | ICD-10-CM

## 2016-03-30 DIAGNOSIS — G4730 Sleep apnea, unspecified: Secondary | ICD-10-CM

## 2016-03-30 DIAGNOSIS — R351 Nocturia: Secondary | ICD-10-CM

## 2016-03-30 MED ORDER — TAMSULOSIN HCL 0.4 MG PO CAPS: 0.4000 mg | ORAL_CAPSULE | Freq: Every day | ORAL | Status: DC

## 2016-03-30 NOTE — Progress Notes (Signed)
Daily Session Note  Patient Details  Name: Wesley Newton MRN: [id number] Date of Birth: 1927/04/29 Referring Provider:  Erby Pian, MD  Encounter Date: 03/30/2016  Check In:     Session Check In - 03/30/16 1124    Check-In   Staff Present Nyoka Cowden, RN;Evette Diclemente, RN, BSN, CCRP;Carroll Enterkin, RN, BSN   Supervising physician immediately available to respond to emergencies [facility] immediately available ER MD   Physician(s) Drs: Edd Fabian and Burlene Arnt   Medication changes reported     No   Fall or balance concerns reported    No   Warm-up and Cool-down Performed on first and last piece of equipment   Resistance Training Performed Yes   VAD Patient? No   Pain Assessment   Currently in Pain? No/denies         Goals Met:  Independence with exercise equipment Exercise tolerated well Strength training completed today  Goals Unmet:  Not Applicable  Comments: Doing well with exercise prescription progression.    Dr. Emily Filbert is Medical Director for [place] and [facility].

## 2016-04-01 ENCOUNTER — Encounter: Payer: Medicare Other | Admitting: *Deleted

## 2016-04-01 DIAGNOSIS — J45909 Unspecified asthma, uncomplicated: Secondary | ICD-10-CM

## 2016-04-01 DIAGNOSIS — G4730 Sleep apnea, unspecified: Secondary | ICD-10-CM

## 2016-04-01 DIAGNOSIS — J45998 Other asthma: Secondary | ICD-10-CM | POA: Diagnosis not present

## 2016-04-01 NOTE — Progress Notes (Signed)
Daily Session Note  Patient Details  Name: Wesley Newton MRN: [id number] Date of Birth: 14-Dec-1927 Referring Provider:    Encounter Date: 04/01/2016  Check In:     Session Check In - 04/01/16 1141    Check-In   Staff Present Nyoka Cowden, RN;Susanne Bice, RN, BSN, CCRP;Carroll Enterkin, RN, BSN   Supervising physician immediately available to respond to emergencies [facility] immediately available ER MD   Physician(s) Drs Joni Fears and Jacqualine Code   Medication changes reported     No   Fall or balance concerns reported    No   Warm-up and Cool-down Performed on first and last piece of equipment   VAD Patient? No   Pain Assessment   Currently in Pain? No/denies         Goals Met:  Exercise tolerated well  Goals Unmet:  Not Applicable  Comments: Doing well with exercise prescription progression.    Dr. Emily Filbert is Medical Director for [place] and [facility].

## 2016-04-04 ENCOUNTER — Encounter: Payer: Medicare Other | Admitting: *Deleted

## 2016-04-04 DIAGNOSIS — J45998 Other asthma: Secondary | ICD-10-CM | POA: Diagnosis not present

## 2016-04-04 DIAGNOSIS — G4730 Sleep apnea, unspecified: Secondary | ICD-10-CM

## 2016-04-04 DIAGNOSIS — J45909 Unspecified asthma, uncomplicated: Secondary | ICD-10-CM

## 2016-04-04 NOTE — Progress Notes (Signed)
Pulmonary Individual Treatment Plan  Patient Details  Name: ADRAIN NESBIT MRN: [id number] Date of Birth: Oct 25, 1927 Referring Provider: Dr. Raul Del   Initial Encounter Date:       Pulmonary Rehab from 01/27/2016 in [facility] Cardiac and Pulmonary Rehab   Date  01/27/16      Visit Diagnosis: Asthma, chronic, unspecified asthma severity, uncomplicated  Sleep apnea  Patient's Home Medications on Admission:  Current outpatient prescriptions:  .  albuterol (PROVENTIL HFA;VENTOLIN HFA) 108 (90 Base) MCG/ACT inhaler, Inhale 2 puffs into the lungs every 6 (six) hours as needed for wheezing or shortness of breath., Disp: , Rfl:  .  albuterol (PROVENTIL) (2.5 MG/3ML) 0.083% nebulizer solution, Take 2.5 mg by nebulization every 6 (six) hours as needed for wheezing or shortness of breath., Disp: , Rfl:  .  aspirin EC 81 MG tablet, Take 81 mg by mouth daily. , Disp: , Rfl:  .  budesonide-formoterol (SYMBICORT) 160-4.5 MCG/ACT inhaler, Inhale 2 puffs into the lungs 2 (two) times daily., Disp: , Rfl:  .  finasteride (PROSCAR) 5 MG tablet, Take 1 tablet (5 mg total) by mouth daily., Disp: 30 tablet, Rfl: 3 .  fluticasone (FLONASE) 50 MCG/ACT nasal spray, Place into the nose., Disp: , Rfl:  .  ipratropium-albuterol (DUONEB) 0.5-2.5 (3) MG/3ML SOLN, Inhale into the lungs., Disp: , Rfl:  .  loratadine (ALLERGY) 10 MG dissolvable tablet, Take by mouth., Disp: , Rfl:  .  Multiple Vitamins-Minerals (PRESERVISION AREDS 2 PO), Take 1 capsule by mouth 2 (two) times daily., Disp: , Rfl:  .  naproxen (NAPROSYN) 375 MG tablet, Take 375 mg by mouth 3 (three) times daily as needed for mild pain. , Disp: , Rfl: 2 .  omeprazole (PRILOSEC) 40 MG capsule, Take 40 mg by mouth daily. , Disp: , Rfl: 4 .  simvastatin (ZOCOR) 40 MG tablet, Take 40 mg by mouth at bedtime. , Disp: , Rfl: 0 .  tamsulosin (FLOMAX) 0.4 MG CAPS capsule, Take 1 capsule (0.4 mg total) by mouth daily., Disp: 30 capsule, Rfl: 3 .  vitamin B-12  (CYANOCOBALAMIN) 1000 MCG tablet, Take 1,000 mcg by mouth daily., Disp: , Rfl:   Past Medical History: Past Medical History  Diagnosis Date  . Cancer ([place])   . DVT of leg (deep venous thrombosis) ([place])   . Emphysema lung ([place])   . Elevated PSA   . BPH (benign prostatic hyperplasia)   . COPD (chronic obstructive pulmonary disease) ([place])   . Nocturia   . Pulmonary fibrosis ([facility])     Tobacco Use: History  Smoking status  . Former Smoker  Smokeless tobacco  . Not on file    Comment: quit 40 years    Labs: Recent Review Flowsheet Data    There is no flowsheet data to display.       ADL [facility]SD:     Pulmonary Assessment Scores      01/27/16 1136 01/27/16 1406     ADL UCSD   ADL Phase Entry     SOB Score total  60    Rest  0    Walk  1    Stairs  3    Bath  4    Dress  4    Shop  2       Pulmonary Function Assessment:     Pulmonary Function Assessment - 01/27/16 1300    Initial Spirometry Results   FVC% 106 %   FEV1% 134 %   FEV1/FVC Ratio 95.65  Breath   Bilateral Breath Sounds Rales;Basilar;Inspiratory   Shortness of Breath Yes;Limiting activity      Exercise Target Goals:    Exercise Program Goal: Individual exercise prescription set with THRR, safety & activity barriers. Participant demonstrates ability to understand and report RPE using BORG scale, to self-measure pulse accurately, and to acknowledge the importance of the exercise prescription.  Exercise Prescription Goal: Starting with aerobic activity 30 plus minutes a day, 3 days per week for initial exercise prescription. Provide home exercise prescription and guidelines that participant acknowledges understanding prior to discharge.  Activity Barriers & Risk Stratification:     Activity Barriers & Cardiac Risk Stratification - 01/27/16 1205    Activity Barriers & Cardiac Risk Stratification   Activity Barriers Balance Concerns;Shortness of Breath;Deconditioning      6 Minute Walk:      6 Minute Walk      01/27/16 1331 03/14/16 1109     6 Minute Walk   Phase Initial Mid Program    Distance 1245 feet 1300 feet    Distance % Change  4 %    Walk Time 6 minutes 6 minutes    # of Rest Breaks 0     MPH 2.4     RPE 12 13    Perceived Dyspnea  2 3    Symptoms Yes (comment) No    Comments Light headed after walking, blurred vision, short of breath     Resting HR 90 bpm 70 bpm    Resting BP 128/68 mmHg 124/72 mmHg    Max Ex. HR 116 bpm 94 bpm    Max Ex. BP 150/60 mmHg 148/74 mmHg       Initial Exercise Prescription:     Initial Exercise Prescription - 01/27/16 1300    Date of Initial Exercise RX and Referring Provider   Date 01/27/16   Treadmill   MPH 2   Grade 0   Minutes 10  Use intervals if necessary such as 2, 5 min intervals   Bike   Level 0.4   Watts 12   Minutes 10   Recumbant Bike   Level 3   RPM 40   Watts 25   Minutes 10   NuStep   Level 2   Watts 25   Minutes 10   Arm Ergometer   Level 1   Watts 8   Minutes 10   Arm/Foot Ergometer   Level 4   Watts 12   Minutes 10   Cybex   Level 2   RPM 50   Minutes 10   Recumbant Elliptical   Level 1   RPM 35   Watts 10   Minutes 10   REL-XR   Level 2   Watts 35   Minutes 10   T5 Nustep   Level 1   Watts 10   Minutes 10   Biostep-RELP   Level 2   Watts 15   Minutes 10   Prescription Details   Frequency (times per week) 3   Duration Progress to 30 minutes of continuous aerobic without signs/symptoms of physical distress   Intensity   THRR REST +  30   Ratings of Perceived Exertion 11-13   Perceived Dyspnea 2-4   Progression   Progression Continue progressive overload as per policy without signs/symptoms or physical distress.   Resistance Training   Training Prescription Yes   Weight 2   Reps 10-12      Perform Capillary Blood Glucose checks as  needed.  Exercise Prescription Changes:     Exercise Prescription Changes      01/27/16 1100 02/03/16 1200 02/05/16 1000  02/08/16 1000 02/10/16 1100   Exercise Review   Progression   Yes Yes Yes   Response to Exercise   Blood Pressure (Admit) 128/68 mmHg 122/70 mmHg      Blood Pressure (Exercise) 150/60 mmHg 136/60 mmHg      Blood Pressure (Exit) 128/62 mmHg 124/76 mmHg      Heart Rate (Admit) 90 bpm 92 bpm      Heart Rate (Exercise) 116 bpm 111 bpm      Heart Rate (Exit) 96 bpm 104 bpm      Oxygen Saturation (Admit) 96 % 95 %      Oxygen Saturation (Exercise) 90 % 92 %      Oxygen Saturation (Exit) 96 % 97 %      Rating of Perceived Exertion (Exercise)  11      Perceived Dyspnea (Exercise)  3      Symptoms  Heavy cough, he says it is chronic None None None   Comments  First day of exercise! Patient was oriented to the gym and the equipment functions and settings. Procedures and policies of the gym were outlined and explained. The patient's individual exercise prescription and treatment plan were reviewed with them. All starting workloads were established based on the results of the functional testing  done at the initial intake visit. The plan for exercise progression was also introduced and progression will be customized based on the patient's performance and goals Reviewed individualized exercise prescription and made increases per departmental policy. Exercise increases were discussed with the patient and they were able to perform the new work loads without issue (no signs or symptoms).  Doctor's stamina is increasing and this is reflected in longer exercise times. He is not quite exercising for the full class time. Ashwath continues to increase in stamina and has progressed on the TM which is his most difficult machine. He reports feeling more confident in his walking and has noticed the improvement    Duration  Progress to 30 minutes of continuous aerobic without signs/symptoms of physical distress Progress to 30 minutes of continuous aerobic without signs/symptoms of physical distress Progress to 30 minutes of  continuous aerobic without signs/symptoms of physical distress Progress to 30 minutes of continuous aerobic without signs/symptoms of physical distress   Intensity  Rest + 30 Rest + 30 Rest + 30 Rest + 30   Progression   Progression  Continue progressive overload as per policy without signs/symptoms or physical distress. Continue progressive overload as per policy without signs/symptoms or physical distress. Continue progressive overload as per policy without signs/symptoms or physical distress. Continue progressive overload as per policy without signs/symptoms or physical distress.   Resistance Training   Training Prescription (read-only)  Yes Yes Yes Yes   Weight (read-only)  '2 2 2 2   ' Reps (read-only)  10-12 10-12 10-12 10-12   Treadmill   MPH (read-only)  '2 2 2 2   ' Grade (read-only)  0 0 0 0   Minutes (read-only)  10  5 min intervals x '2 10  5 ' min intervals x '2 10  5 ' min intervals x 2 12   Recumbant Bike   Level (read-only)  '3 3 3 3   ' RPM (read-only)  60 60 60 60   Minutes (read-only)  '10 12 15 15   ' NuStep   Level (read-only)  2 4  4 4   Watts (read-only)  '25 25 25 25   ' Minutes (read-only)  '10 10 12 12     ' 02/12/16 1100 02/15/16 1000 02/19/16 1100 02/22/16 1100 03/07/16 1100   Exercise Review   Progression Yes Yes Yes Yes Yes   Response to Exercise   Blood Pressure (Admit)    132/68 mmHg    Blood Pressure (Exercise)    126/74 mmHg    Blood Pressure (Exit)    124/62 mmHg    Heart Rate (Admit)    82 bpm    Heart Rate (Exercise)    96 bpm    Heart Rate (Exit)    105 bpm    Oxygen Saturation (Admit)    96 %    Oxygen Saturation (Exercise)    95 %    Oxygen Saturation (Exit)    98 %    Rating of Perceived Exertion (Exercise)    13    Perceived Dyspnea (Exercise)    3    Symptoms None None None None None   Comments Izsak continues to increase in stamina and has progressed on the TM which is his most difficult machine. He reports feeling more confident in his walking and has noticed  the improvement  Ezri continues to increase in stamina and has progressed on the TM which is his most difficult machine. Damare increased his time on the TM. These changes were reviewed with him and he tolerated this increase with no signs or symptoms.  Quantel continues to increase in stamina and has progressed on the TM which is his most difficult machine. Windle increased his time on the TM. These changes were reviewed with him and he tolerated this increase with no signs or symptoms.  Increases made to the patient's exercise prescription were reviewed with the patient. He tolerated these changes with no signs or sypmtoms.  Increases made to the patient's exercise prescription were reviewed with the patient. He tolerated these changes with no signs or sypmtoms.    Duration Progress to 30 minutes of continuous aerobic without signs/symptoms of physical distress Progress to 30 minutes of continuous aerobic without signs/symptoms of physical distress Progress to 30 minutes of continuous aerobic without signs/symptoms of physical distress Progress to 30 minutes of continuous aerobic without signs/symptoms of physical distress Progress to 30 minutes of continuous aerobic without signs/symptoms of physical distress   Intensity Rest + 30 Rest + 30 Rest + 30 Rest + 30 Rest + 30   Progression   Progression Continue progressive overload as per policy without signs/symptoms or physical distress. Continue progressive overload as per policy without signs/symptoms or physical distress. Continue progressive overload as per policy without signs/symptoms or physical distress. Continue progressive overload as per policy without signs/symptoms or physical distress. Continue progressive overload as per policy without signs/symptoms or physical distress.   Resistance Training   Training Prescription (read-only) Yes Yes      Weight (read-only) 2 3      Reps (read-only) 10-12 10-12      Treadmill   MPH   2.2 2.2 2.2   Grade    0 0    Minutes    15 15   MPH (read-only) 2 2      Grade (read-only) 0 0      Minutes (read-only) 12 15      Recumbant Bike   Level    4 5   RPM    60 65   Watts    15  15   Level (read-only) 3 3      RPM (read-only) 60 60      Minutes (read-only) 15 15      NuStep   Level    5 5   Watts    50 50   Minutes    15 15   Level (read-only) 4 4      Watts (read-only) 25 25      Minutes (read-only) 15 15        03/14/16 1000 03/18/16 1000 03/25/16 1100 04/04/16 1200     Exercise Review   Progression Yes Yes Yes     Response to Exercise   Blood Pressure (Admit)    110/60 mmHg    Blood Pressure (Exercise)    130/84 mmHg    Blood Pressure (Exit)    122/50 mmHg    Heart Rate (Admit)    83 bpm    Heart Rate (Exercise)    107 bpm    Heart Rate (Exit)    87 bpm    Oxygen Saturation (Admit)    95 %    Oxygen Saturation (Exercise)    92 %    Oxygen Saturation (Exit)    96 %    Rating of Perceived Exertion (Exercise)    15    Perceived Dyspnea (Exercise)    4    Symptoms None None None None    Comments TM speed has been increased to support Kieron's strength and stamina gains and to continue to progress his aerobic capacity via walking. Making steady increases on the TM.  Making steady increases on the TM.  Exericse increases were discussed with patient and he tolerated them with no signs or symptoms.     Duration Progress to 30 minutes of continuous aerobic without signs/symptoms of physical distress Progress to 30 minutes of continuous aerobic without signs/symptoms of physical distress Progress to 30 minutes of continuous aerobic without signs/symptoms of physical distress Progress to 45 minutes of aerobic exercise without signs/symptoms of physical distress    Intensity Rest + 30 Rest + 30 Rest + 30 Rest + 30    Progression   Progression Continue progressive overload as per policy without signs/symptoms or physical distress. Continue progressive overload as per policy without signs/symptoms or  physical distress. Continue progressive overload as per policy without signs/symptoms or physical distress. Continue progressive overload as per policy without signs/symptoms or physical distress.    Resistance Training   Training Prescription Yes Yes Yes Yes    Weight '3 3 3 3    ' Reps 10-12 10-12 10-12 10-12    Treadmill   MPH 2.4 2.5 2.5 2.5    Grade 0 0 0 0    Minutes '15 15 15 15    ' Recumbant Bike   Level '5 5 5 6    ' RPM 65 65 65 63    Watts '15 15 15 15    ' NuStep   Level '5 5 5 5    ' Watts 50 50 75 75    Minutes '15 15 15 15       ' Exercise Comments:     Exercise Comments      03/07/16 1104 03/14/16 1113 03/21/16 1517       Exercise Comments Reviewed individualized exercise prescription and made increases per departmental policy. Exercise increases were discussed with the patient and they were able to perform the new work loads without issue (no signs or symptoms).  Oday did his mid-evaluation 6  min walk today. He tolerated this assessment well and improved 4% in his walk distance as compared to his initial assessment. Vital signs were all within acceptable ranges during this assessment. See 6 min walk data for detailed report.  Mr Diguglielmo has been exercising at [facility] gym 1 or 2 days/week during [facility]. He uses the TM, NS, and BX for at least 30 mins. He enjoys being active and appreciate the access to a gym at his retirement community.        Discharge Exercise Prescription (Final Exercise Prescription Changes):     Exercise Prescription Changes - 04/04/16 1200    Response to Exercise   Blood Pressure (Admit) 110/60 mmHg   Blood Pressure (Exercise) 130/84 mmHg   Blood Pressure (Exit) 122/50 mmHg   Heart Rate (Admit) 83 bpm   Heart Rate (Exercise) 107 bpm   Heart Rate (Exit) 87 bpm   Oxygen Saturation (Admit) 95 %   Oxygen Saturation (Exercise) 92 %   Oxygen Saturation (Exit) 96 %   Rating of Perceived Exertion (Exercise) 15   Perceived Dyspnea (Exercise) 4    Symptoms None   Comments Exericse increases were discussed with patient and he tolerated them with no signs or symptoms.    Duration Progress to 45 minutes of aerobic exercise without signs/symptoms of physical distress   Intensity Rest + 30   Progression   Progression Continue progressive overload as per policy without signs/symptoms or physical distress.   Resistance Training   Training Prescription Yes   Weight 3   Reps 10-12   Treadmill   MPH 2.5   Grade 0   Minutes 15   Recumbant Bike   Level 6   RPM 63   Watts 15   NuStep   Level 5   Watts 75   Minutes 15       Nutrition:  Target Goals: Understanding of nutrition guidelines, daily intake of sodium <1520m, cholesterol <2048m calories 30% from fat and 7% or less from saturated fats, daily to have 5 or more servings of fruits and vegetables.  Biometrics:     Pre Biometrics - 01/27/16 1330    Pre Biometrics   Height 5' 5.75" (1.67 m)   Weight 179 lb (81.194 kg)   Waist Circumference 39 inches   Hip Circumference 42.35 inches   Waist to Hip Ratio 0.92 %   BMI (Calculated) 29.2       Nutrition Therapy Plan and Nutrition Goals:     Nutrition Therapy & Goals - 01/27/16 1416    Intervention Plan   Intervention (read-only) Using nutrition plan and personal goals to gain a healthy nutrition lifestyle. Add exercise as prescribed.      Nutrition Discharge: Rate Your Plate Scores:   Psychosocial: Target Goals: Acknowledge presence or absence of depression, maximize coping skills, provide positive support system. Participant is able to verbalize types and ability to use techniques and skills needed for reducing stress and depression.  Initial Review & Psychosocial Screening:     Initial Psych Review & Screening - 01/27/16 1420    Initial Review   Current issues with Current Sleep Concerns   Family Dynamics   Good Support System? Yes   Comments [facility] [facility]  Several neighbors chaeck on him daily. He has a son and a daughter that he sees weekly and another that lives in [place] He is widowed for 5 years. JaAquanill benifit from the  class participation and the exercise.    Barriers   Psychosocial barriers to participate in program There are no identifiable barriers or psychosocial needs.;The patient should benefit from training in stress management and relaxation.   Screening Interventions   Interventions Encouraged to exercise      Quality of Life Scores:     Quality of Life - 01/27/16 1558    Quality of Life Scores   Health/Function Pre 10.22 %   Socioeconomic Pre 17.14 %   Psych/Spiritual Pre 19.71 %   Family Pre 24 %   GLOBAL Pre 15.22 %      PHQ-9:     Recent Review Flowsheet Data    Depression screen [facility] 2/9 01/27/2016   Decreased Interest 1   Down, Depressed, Hopeless 1   PHQ - 2 Score 2   Altered sleeping 1   Tired, decreased energy 3   Change in appetite 1   Feeling bad or failure about yourself  0   Trouble concentrating 1   Moving slowly or fidgety/restless 1   Suicidal thoughts 0   PHQ-9 Score 9   Difficult doing work/chores Somewhat difficult      Psychosocial Evaluation and Intervention:     Psychosocial Evaluation - 02/03/16 1045    Psychosocial Evaluation & Interventions   Interventions Encouraged to exercise with the program and follow exercise prescription   Comments Counselor met with Mr. Stencil today for initial psychosocial evaluation.  He is an 82 year old gentleman who has Pulmonary Fibrosis.  He has a good support system living in a retirement community as well as (2) adult children who live close by and active involvement in his local church community.  Mr. Amico has some memory issues and struggled a little answering some of  counselor's questions.  He states that he has struggled with depression and anxiety for awhile but is not currently on any medications, although he states the Dr. prescribed  something, but his "insurance won't cover it."  He states that his current mood is "okay" but due to his health and some of the medication reactions he has experienced, it "could be better."   He has goals to increase his stamina and strength in this program.  He is sleeping well and has a good appetite.  Mr. Kloepfer plans to supplement working out in this program with exercising in the gym at the retirement community where he resides.  Counselor will continue to follow up with him, especially on his mood.        Psychosocial Re-Evaluation:     Psychosocial Re-Evaluation      03/23/16 1101           Psychosocial Re-Evaluation   Comments Counselor met with Mr. Abreu for a follow up evaluation today.  He continues to struggle with his memory and has some hoarseness that he is seeing a doctor for later this week.  He reports enjoying this program and sleeping well most of the time.  He states his mood is about the same and his health issues continue to be his primary stressor.  Counselor will continue to follow with him while in this program.          Education: Education Goals: Education classes will be provided on a weekly basis, covering required topics. Participant will state understanding/return demonstration of topics presented.  Learning Barriers/Preferences:     Learning Barriers/Preferences - 01/27/16 1206    Learning Barriers/Preferences   Learning Barriers Hearing;Sight   Learning Preferences  None      Education Topics: Initial Evaluation Education: - Verbal, written and demonstration of respiratory meds, RPE/PD scales, oximetry and breathing techniques. Instruction on use of nebulizers and MDIs: cleaning and proper use, rinsing mouth with steroid doses and importance of monitoring MDI activations.          Pulmonary Rehab from 04/04/2016 in [facility] Cardiac and Pulmonary Rehab   Date  01/27/16   Educator  sb   Instruction Review Code  2- meets goals/outcomes       General Nutrition Guidelines/Fats and Fiber: -Group instruction provided by verbal, written material, models and posters to present the general guidelines for heart healthy nutrition. Gives an explanation and review of dietary fats and fiber.   Controlling Sodium/Reading Food Labels: -Group verbal and written material supporting the discussion of sodium use in heart healthy nutrition. Review and explanation with models, verbal and written materials for utilization of the food label.      Pulmonary Rehab from 04/04/2016 in [facility] Cardiac and Pulmonary Rehab   Date  04/04/16   Educator  CR   Instruction Review Code  2- meets goals/outcomes      Exercise Physiology & Risk Factors: - Group verbal and written instruction with models to review the exercise physiology of the cardiovascular system and associated critical values. Details cardiovascular disease risk factors and the goals associated with each risk factor.      Pulmonary Rehab from 04/04/2016 in [facility] Cardiac and Pulmonary Rehab   Date  03/16/16   Educator  SW   Instruction Review Code  2- meets goals/outcomes      Aerobic Exercise & Resistance Training: - Gives group verbal and written discussion on the health impact of inactivity. On the components of aerobic and resistive training programs and the benefits of this training and how to safely progress through these programs.   Flexibility, Balance, General Exercise Guidelines: - Provides group verbal and written instruction on the benefits of flexibility and balance training programs. Provides general exercise guidelines with specific guidelines to those with heart or lung disease. Demonstration and skill practice provided.      Pulmonary Rehab from 04/04/2016 in [facility] Cardiac and Pulmonary Rehab   Date  02/03/16   Educator  RM   Instruction Review Code  2- meets goals/outcomes      Stress Management: - Provides group verbal and written instruction about the health risks of  elevated stress, cause of high stress, and healthy ways to reduce stress.      Pulmonary Rehab from 04/04/2016 in [facility] Cardiac and Pulmonary Rehab   Date  02/10/16   Educator  [facility]   Instruction Review Code  2- meets goals/outcomes      Depression: - Provides group verbal and written instruction on the correlation between heart/lung disease and depressed mood, treatment options, and the stigmas associated with seeking treatment.      Pulmonary Rehab from 04/04/2016 in [facility] Cardiac and Pulmonary Rehab   Date  03/23/16   Educator  [facility]   Instruction Review Code  2- meets goals/outcomes      Exercise & Equipment Safety: - Individual verbal instruction and demonstration of equipment use and safety with use of the equipment.      Pulmonary Rehab from 04/04/2016 in [facility] Cardiac and Pulmonary Rehab   Date  01/27/16   Educator  Sb   Instruction Review Code  2- meets goals/outcomes      Infection Prevention: - Provides verbal and written material to individual  with discussion of infection control including proper hand washing and proper equipment cleaning during exercise session.      Pulmonary Rehab from 04/04/2016 in [facility] Cardiac and Pulmonary Rehab   Date  01/27/16   Educator  SB   Instruction Review Code  2- meets goals/outcomes      Falls Prevention: - Provides verbal and written material to individual with discussion of falls prevention and safety.      Pulmonary Rehab from 04/04/2016 in [facility] Cardiac and Pulmonary Rehab   Date  01/27/16   Educator  SB   Instruction Review Code  2- meets goals/outcomes      Diabetes: - Individual verbal and written instruction to review signs/symptoms of diabetes, desired ranges of glucose level fasting, after meals and with exercise. Advice that pre and post exercise glucose checks will be done for 3 sessions at entry of program.   Chronic Lung Diseases: - Group verbal and written instruction to review new updates, new respiratory medications,  new advancements in procedures and treatments. Provide informative websites and "800" numbers of self-education.      Pulmonary Rehab from 04/04/2016 in [facility] Cardiac and Pulmonary Rehab   Date  02/01/16   Educator  L. Owens Shark, RT   Instruction Review Code  2- meets goals/outcomes      Lung Procedures: - Group verbal and written instruction to describe testing methods done to diagnose lung disease. Review the outcome of test results. Describe the treatment choices: Pulmonary Function Tests, ABGs and oximetry.   Energy Conservation: - Provide group verbal and written instruction for methods to conserve energy, plan and organize activities. Instruct on pacing techniques, use of adaptive equipment and posture/positioning to relieve shortness of breath.   Triggers: - Group verbal and written instruction to review types of environmental controls: home humidity, furnaces, filters, dust mite/pet prevention, HEPA vacuums. To discuss weather changes, air quality and the benefits of nasal washing.   Exacerbations: - Group verbal and written instruction to provide: warning signs, infection symptoms, calling MD promptly, preventive modes, and value of vaccinations. Review: effective airway clearance, coughing and/or vibration techniques. Create an Sports administrator.      Pulmonary Rehab from 04/04/2016 in [facility] Cardiac and Pulmonary Rehab   Date  02/29/16   Educator  LB   Instruction Review Code  2- meets goals/outcomes      Oxygen: - Individual and group verbal and written instruction on oxygen therapy. Includes supplement oxygen, available portable oxygen systems, continuous and intermittent flow rates, oxygen safety, concentrators, and Medicare reimbursement for oxygen.   Respiratory Medications: - Group verbal and written instruction to review medications for lung disease. Drug class, frequency, complications, importance of spacers, rinsing mouth after steroid MDI's, and proper cleaning methods for  nebulizers.   AED/CPR: - Group verbal and written instruction with the use of models to demonstrate the basic use of the AED with the basic ABC's of resuscitation.      Pulmonary Rehab from 04/04/2016 in [facility] Cardiac and Pulmonary Rehab   Date  02/19/16   Educator  CE   Instruction Review Code  2- meets goals/outcomes      Breathing Retraining: - Provides individuals verbal and written instruction on purpose, frequency, and proper technique of diaphragmatic breathing and pursed-lipped breathing. Applies individual practice skills.      Pulmonary Rehab from 04/04/2016 in [facility] Cardiac and Pulmonary Rehab   Date  01/27/16   Educator  sB   Instruction Review Code  2- meets goals/outcomes  Anatomy and Physiology of the Lungs: - Group verbal and written instruction with the use of models to provide basic lung anatomy and physiology related to function, structure and complications of lung disease.      Pulmonary Rehab from 04/04/2016 in [facility] Cardiac and Pulmonary Rehab   Date  03/11/16   Educator  SJ RRT   Instruction Review Code  2- meets goals/outcomes      Heart Failure: - Group verbal and written instruction on the basics of heart failure: signs/symptoms, treatments, explanation of ejection fraction, enlarged heart and cardiomyopathy.   Sleep Apnea: - Individual verbal and written instruction to review Obstructive Sleep Apnea. Review of risk factors, methods for diagnosing and types of masks and machines for OSA.   Anxiety: - Provides group, verbal and written instruction on the correlation between heart/lung disease and anxiety, treatment options, and management of anxiety.   Relaxation: - Provides group, verbal and written instruction about the benefits of relaxation for patients with heart/lung disease. Also provides patients with examples of relaxation techniques.      Pulmonary Rehab from 04/04/2016 in [facility] Cardiac and Pulmonary Rehab   Date  02/24/16   Educator  [facility]    Instruction Review Code  2- Meets goals/outcomes      Knowledge Questionnaire Score:     Knowledge Questionnaire Score - 01/27/16 1403    Knowledge Questionnaire Score   Pre Score 6/10       Core Components/Risk Factors/Patient Goals at Admission:     Personal Goals and Risk Factors at Admission - 01/27/16 1422    Core Components/Risk Factors/Patient Goals on Admission   Sedentary Yes  Tymere utilizes the Treadmill and weight machine at his residence. He tries 3-5 days a week if he is feeling well. His goal is to stay active.  He has recently had concerns with "dizziness" and he was advised to talk to his PMD about the symptoms.    Intervention (read-only) While in program, learn and follow the exercise prescription taught. Start at a low level workload and increase workload after able to maintain previous level for 30 minutes. Increase time before increasing intensity.   Improve shortness of breath with ADL's Yes  Jervon scored some fear with SOB, he stated he has SOB with exertion, that has learned to space his activities to be less SOB   Intervention (read-only) While in program, learn and follow the exercise prescription taught. Start at a low level workload and increase workload ad advised by the exercise physiologist. Increase time before increasing intensity.   Develop more efficient breathing techniques such as purse lipped breathing and diaphragmatic breathing; and practicing self-pacing with activity Yes  Krithik instructed on purse lipped breathing and he had great return demonstration.   Intervention (read-only) While in program, learn and utilize the specific breathing techniques taught to you. Continue to practice and use the techniques as needed.   Increase knowledge of respiratory medications and ability to use respiratory devices properly  Yes  Lamarr has ventolin as needed, new nebuliser to use. Symbicort inhaler also.  Rolando did not have a spacer, will provide him with one and  instruction for proper use next visit.   Intervention (read-only) While in program learn and demonstrate appropriate use of your oxygen therapy by increasing flow with exertion, manage oxygen tank operation, including continuous and intermittent flow.  Understanding oxygen is a drug ordered by your physician.   Hypertension Yes   Goal Participant will see blood pressure controlled within the  values of 140/35m/Hg or within value directed by their physician.   Intervention (read-only) Provide nutrition & aerobic exercise along with prescribed medications to achieve BP 140/90 or less.   Lipids Yes   Goal Cholesterol controlled with medications as prescribed, with individualized exercise RX and with personalized nutrition plan. Value goals: LDL < 775m HDL > 4060mParticipant states understanding of desired cholesterol values and following prescriptions.   Intervention (read-only) Provide nutrition & aerobic exercise along with prescribed medications to achieve LDL <80m68mDL >40mg75mPersonal Goal Other Yes   Personal Goal Stay Active   Intervention Follow exercise prescription guidelines to gradually increase activity levels without increase in SOB.    Understand more about Heart/Pulmonary Disease. Yes  Renwick came to the program with a diagnosis of Asthma and Sleep apnea.  Geoge Kaleabed interest in learning more about the disease process and what he can do to keep it controlled.      Intervention While in program utilize professionals for any questions, and attend the education sessions. Great websites to use are www.americanheart.org or www.lung.org for reliable information.  Deondrea has stoppped using the CPAP,because he tried every method available to use the machine and he did not tolerate any method.      Core Components/Risk Factors/Patient Goals Review:      Goals and Risk Factor Review      02/03/16 1000 02/22/16 1000 02/29/16 1000 03/23/16 1624     Core Components/Risk Factors/Patient Goals  Review   Personal Goals Review Develop more efficient breathing techniques such as purse lipped breathing and diaphragmatic breathing and practicing self-pacing with activity. Sedentary;Improve shortness of breath with ADL's;Increase knowledge of respiratory medications and ability to use respiratory devices properly.;Develop more efficient breathing techniques such as purse lipped breathing and diaphragmatic breathing and practicing self-pacing with activity. Increase knowledge of respiratory medications and ability to use respiratory devices properly. Improve shortness of breath with ADL's;Increase knowledge of respiratory medications and ability to use respiratory devices properly.;Develop more efficient breathing techniques such as purse lipped breathing and diaphragmatic breathing and practicing self-pacing with activity.    Review  Mr FauceKoorses he has more energy, although he still has increased shortness of breath; he is using PLB and does find that technique helpful; Gave Mr FauceMccambridgerochamber for his Ventolin and Symbicort Mr FisheCaryn Sectionxperiencing horseness. We discussed that his Symbicort may be causing this, and at his appointment with Dr FlemiRaul Delrrow, I suggested he mention this to Dr FlemiRaul Dele could possibly order another inhaler  which would not cause this side effect. Mr FauceHalpininues to use his MDI's as prescribed and his aerochamber with his albuterol and Symbicort; He manages his shortness of breath with PLB and [facility]    Expected Outcomes  Continue to improve energy and stamina with his exercise goals; follow-up on aerochamber use.  Continue progressing with his exercise goals and learning management of his lung condition.    Breathing Techniques (read-only)   Goals Progress/Improvement seen  Yes       Comments Reviewed PLB with Mr FauceKsiazekencouraged him to use this technique with his exercise goals.          Core  Components/Risk Factors/Patient Goals at Discharge (Final Review):      Goals and Risk Factor Review - 03/23/16 1624    Core Components/Risk Factors/Patient Goals Review   Personal Goals Review Improve shortness of breath with  ADL's;Increase knowledge of respiratory medications and ability to use respiratory devices properly.;Develop more efficient breathing techniques such as purse lipped breathing and diaphragmatic breathing and practicing self-pacing with activity.   Review Mr Stauber continues to use his MDI's as prescribed and his aerochamber with his albuterol and Symbicort; He manages his shortness of breath with PLB and pacing which he uses at the [facility] as well.   Expected Outcomes Continue progressing with his exercise goals and learning management of his lung condition.      ITP Comments:     ITP Comments      01/27/16 1409 01/27/16 1411 02/03/16 1000 02/03/16 1053 02/05/16 1056   ITP Comments Today Barnabas Lister attended his orientation. Program and personal goals were reviewed with Barnabas Lister.  Expectationis that Reichen will meet his goals at the end of the 36 session. Review will be done during his program to set short term goals as needed.  At the end of the 6 minute walk, Pruitt experienced an increase in his "dizziness" . This got better after a couple cups of water and sitting, but did not totally resolve. after discussion with jacj, I took him by wheelchair to the [facility] at his primary MD office. Sirron will be evaluated and treated at the clinic. BP was fine, Blood sugar check  was 52m/Dl.  Mr FCovellicompleted his first exercise day in [facility]. He reached his goals and did very well and will progress in the program. JUsbaldois expected to meet his goals by end of the program in 35 sessions.  Personal and exercise goals expected to be met in 33 more sessions. Progress on specific individualized goals will be charted in patient's ITP. Upon completion of the program the patient will  be comfortable managing exercise goals and progression on their own.      02/08/16 1057 02/10/16 1107 02/12/16 1059 02/15/16 1041 02/24/16 1102   ITP Comments Personal and exercise goals expected to be met in 32 more sessions. Progress on specific individualized goals will be charted in patient's ITP. Upon completion of the program the patient will be comfortable managing exercise goals and progression on their own.  Personal and exercise goals expected to be met in 31 more sessions. Progress on specific individualized goals will be charted in patient's ITP. Upon completion of the program the patient will be comfortable managing exercise goals and progression on their own.  Personal and exercise goals expected to be met in 30 more sessions. Progress on specific individualized goals will be charted in patient's ITP. Upon completion of the program the patient will be comfortable managing exercise goals and progression on their own.  Personal and exercise goals expected to be met in 29 more sessiosns. See ITP for specific notes no goal progression.  Progress on specific individualized goals will be charted in patient's ITP. Upon completion of the program the patient will be comfortable managing exercise goals and progression on their own.      03/02/16 1452 03/09/16 1000 03/23/16 1000 03/28/16 1034     ITP Comments Mr FGrigorianwas started on an antibiotic by Dr FRaul Deland referred to an Ear, Nose, Throat specialist. Mr FZhendid not feel well today. He did take 2 puffs on his Ventolin MDI, but still decided to omit exercise for the day. Mr FArmwoodhas an appointment with Dr BVirgia Landfor his cough on 03/25/2016. JTrevyonreported that on Friday the doctor ran that light down his throat. He said they will increase  his reflux medicine. Arsalan reports he tried the nasal spray they gave him but he "can't take it since it makes him dizzy and he didn't feel well.        Comments: 30 Day Review

## 2016-04-04 NOTE — Progress Notes (Signed)
Daily Session Note  Patient Details  Name: MERRIT FRIESEN MRN: [id number] Date of Birth: 1927-08-04 Referring Provider:    Encounter Date: 04/04/2016  Check In:     Session Check In - 04/04/16 1214    Check-In   Location [facility]-Cardiac & Pulmonary Rehab   Staff Present Gerlene Burdock, RN, BSN;Laureen Owens Shark, BS, RRT, Respiratory Therapist;Jolan Mealor Amedeo Plenty, BS, ACSM CEP, Exercise Physiologist   Supervising physician immediately available to respond to emergencies [facility] immediately available ER MD   Physician(s) Edd Fabian and Corky Downs   Medication changes reported     No   Fall or balance concerns reported    No   Warm-up and Cool-down Performed on first and last piece of equipment   Resistance Training Performed Yes   VAD Patient? No   Pain Assessment   Currently in Pain? No/denies   Multiple Pain Sites No         Goals Met:  Proper associated with RPD/PD & O2 Sat Independence with exercise equipment Exercise tolerated well Strength training completed today  Goals Unmet:  Not Applicable  Comments: Patient completed exercise prescription and all exercise goals during rehab session. The exercise was tolerated well and the patient is progressing in the program.     Dr. Emily Filbert is Medical Director for [place] and [facility] Pulmonary Rehabilitation.

## 2016-04-04 NOTE — Progress Notes (Deleted)
Pulmonary Individual Treatment Plan  Patient Details  Name: Wesley Newton MRN: [id number] Date of Birth: [birth year] Referring Provider: Raul Del   Initial Encounter Date:       Pulmonary Rehab from 01/27/2016 in [facility] Cardiac and Pulmonary Rehab   Date  01/27/16      Visit Diagnosis: Asthma, chronic, unspecified asthma severity, uncomplicated  Sleep apnea  Patient's Home Medications on Admission:  Current outpatient prescriptions:  .  albuterol (PROVENTIL HFA;VENTOLIN HFA) 108 (90 Base) MCG/ACT inhaler, Inhale 2 puffs into the lungs every 6 (six) hours as needed for wheezing or shortness of breath., Disp: , Rfl:  .  albuterol (PROVENTIL) (2.5 MG/3ML) 0.083% nebulizer solution, Take 2.5 mg by nebulization every 6 (six) hours as needed for wheezing or shortness of breath., Disp: , Rfl:  .  aspirin EC 81 MG tablet, Take 81 mg by mouth daily. , Disp: , Rfl:  .  budesonide-formoterol (SYMBICORT) 160-4.5 MCG/ACT inhaler, Inhale 2 puffs into the lungs 2 (two) times daily., Disp: , Rfl:  .  finasteride (PROSCAR) 5 MG tablet, Take 1 tablet (5 mg total) by mouth daily., Disp: 30 tablet, Rfl: 3 .  fluticasone (FLONASE) 50 MCG/ACT nasal spray, Place into the nose., Disp: , Rfl:  .  ipratropium-albuterol (DUONEB) 0.5-2.5 (3) MG/3ML SOLN, Inhale into the lungs., Disp: , Rfl:  .  loratadine (ALLERGY) 10 MG dissolvable tablet, Take by mouth., Disp: , Rfl:  .  Multiple Vitamins-Minerals (PRESERVISION AREDS 2 PO), Take 1 capsule by mouth 2 (two) times daily., Disp: , Rfl:  .  naproxen (NAPROSYN) 375 MG tablet, Take 375 mg by mouth 3 (three) times daily as needed for mild pain. , Disp: , Rfl: 2 .  omeprazole (PRILOSEC) 40 MG capsule, Take 40 mg by mouth daily. , Disp: , Rfl: 4 .  simvastatin (ZOCOR) 40 MG tablet, Take 40 mg by mouth at bedtime. , Disp: , Rfl: 0 .  tamsulosin (FLOMAX) 0.4 MG CAPS capsule, Take 1 capsule (0.4 mg total) by mouth daily., Disp: 30 capsule, Rfl: 3 .  vitamin B-12  (CYANOCOBALAMIN) 1000 MCG tablet, Take 1,000 mcg by mouth daily., Disp: , Rfl:   Past Medical History: Past Medical History  Diagnosis Date  . Cancer ([place])   . DVT of leg (deep venous thrombosis) ([place])   . Emphysema lung ([place])   . Elevated PSA   . BPH (benign prostatic hyperplasia)   . COPD (chronic obstructive pulmonary disease) ([place])   . Nocturia   . Pulmonary fibrosis ([facility])     Tobacco Use: History  Smoking status  . Former Smoker  Smokeless tobacco  . Not on file    Comment: quit 40 years    Labs: Recent Review Flowsheet Data    There is no flowsheet data to display.       ADL [facility]SD:     Pulmonary Assessment Scores      01/27/16 1136 01/27/16 1406     ADL UCSD   ADL Phase Entry     SOB Score total  60    Rest  0    Walk  1    Stairs  3    Bath  4    Dress  4    Shop  2       Pulmonary Function Assessment:     Pulmonary Function Assessment - 01/27/16 1300    Initial Spirometry Results   FVC% 106 %   FEV1% 134 %   FEV1/FVC Ratio 95.65   Breath  Bilateral Breath Sounds Rales;Basilar;Inspiratory   Shortness of Breath Yes;Limiting activity      Exercise Target Goals:    Exercise Program Goal: Individual exercise prescription set with THRR, safety & activity barriers. Participant demonstrates ability to understand and report RPE using BORG scale, to self-measure pulse accurately, and to acknowledge the importance of the exercise prescription.  Exercise Prescription Goal: Starting with aerobic activity 30 plus minutes a day, 3 days per week for initial exercise prescription. Provide home exercise prescription and guidelines that participant acknowledges understanding prior to discharge.  Activity Barriers & Risk Stratification:     Activity Barriers & Cardiac Risk Stratification - 01/27/16 1205    Activity Barriers & Cardiac Risk Stratification   Activity Barriers Balance Concerns;Shortness of Breath;Deconditioning      6 Minute Walk:      6 Minute Walk      01/27/16 1331 03/14/16 1109     6 Minute Walk   Phase Initial Mid Program    Distance 1245 feet 1300 feet    Distance % Change  4 %    Walk Time 6 minutes 6 minutes    # of Rest Breaks 0     MPH 2.4     RPE 12 13    Perceived Dyspnea  2 3    Symptoms Yes (comment) No    Comments Light headed after walking, blurred vision, short of breath     Resting HR 90 bpm 70 bpm    Resting BP 128/68 mmHg 124/72 mmHg    Max Ex. HR 116 bpm 94 bpm    Max Ex. BP 150/60 mmHg 148/74 mmHg       Initial Exercise Prescription:     Initial Exercise Prescription - 01/27/16 1300    Date of Initial Exercise RX and Referring Provider   Date 01/27/16   Treadmill   MPH 2   Grade 0   Minutes 10  Use intervals if necessary such as 2, 5 min intervals   Bike   Level 0.4   Watts 12   Minutes 10   Recumbant Bike   Level 3   RPM 40   Watts 25   Minutes 10   NuStep   Level 2   Watts 25   Minutes 10   Arm Ergometer   Level 1   Watts 8   Minutes 10   Arm/Foot Ergometer   Level 4   Watts 12   Minutes 10   Cybex   Level 2   RPM 50   Minutes 10   Recumbant Elliptical   Level 1   RPM 35   Watts 10   Minutes 10   REL-XR   Level 2   Watts 35   Minutes 10   T5 Nustep   Level 1   Watts 10   Minutes 10   Biostep-RELP   Level 2   Watts 15   Minutes 10   Prescription Details   Frequency (times per week) 3   Duration Progress to 30 minutes of continuous aerobic without signs/symptoms of physical distress   Intensity   THRR REST +  30   Ratings of Perceived Exertion 11-13   Perceived Dyspnea 2-4   Progression   Progression Continue progressive overload as per policy without signs/symptoms or physical distress.   Resistance Training   Training Prescription Yes   Weight 2   Reps 10-12      Perform Capillary Blood Glucose checks as needed.  Exercise  Prescription Changes:     Exercise Prescription Changes      01/27/16 1100 02/03/16 1200 02/05/16 1000  02/08/16 1000 02/10/16 1100   Exercise Review   Progression   Yes Yes Yes   Response to Exercise   Blood Pressure (Admit) 128/68 mmHg 122/70 mmHg      Blood Pressure (Exercise) 150/60 mmHg 136/60 mmHg      Blood Pressure (Exit) 128/62 mmHg 124/76 mmHg      Heart Rate (Admit) 90 bpm 92 bpm      Heart Rate (Exercise) 116 bpm 111 bpm      Heart Rate (Exit) 96 bpm 104 bpm      Oxygen Saturation (Admit) 96 % 95 %      Oxygen Saturation (Exercise) 90 % 92 %      Oxygen Saturation (Exit) 96 % 97 %      Rating of Perceived Exertion (Exercise)  11      Perceived Dyspnea (Exercise)  3      Symptoms  Heavy cough, he says it is chronic None None None   Comments  First day of exercise! Patient was oriented to the gym and the equipment functions and settings. Procedures and policies of the gym were outlined and explained. The patient's individual exercise prescription and treatment plan were reviewed with them. All starting workloads were established based on the results of the functional testing  done at the initial intake visit. The plan for exercise progression was also introduced and progression will be customized based on the patient's performance and goals Reviewed individualized exercise prescription and made increases per departmental policy. Exercise increases were discussed with the patient and they were able to perform the new work loads without issue (no signs or symptoms).  Wesley Newton's stamina is increasing and this is reflected in longer exercise times. He is not quite exercising for the full class time. Wesley Newton continues to increase in stamina and has progressed on the TM which is his most difficult machine. He reports feeling more confident in his walking and has noticed the improvement    Duration  Progress to 30 minutes of continuous aerobic without signs/symptoms of physical distress Progress to 30 minutes of continuous aerobic without signs/symptoms of physical distress Progress to 30 minutes of  continuous aerobic without signs/symptoms of physical distress Progress to 30 minutes of continuous aerobic without signs/symptoms of physical distress   Intensity  Rest + 30 Rest + 30 Rest + 30 Rest + 30   Progression   Progression  Continue progressive overload as per policy without signs/symptoms or physical distress. Continue progressive overload as per policy without signs/symptoms or physical distress. Continue progressive overload as per policy without signs/symptoms or physical distress. Continue progressive overload as per policy without signs/symptoms or physical distress.   Resistance Training   Training Prescription (read-only)  Yes Yes Yes Yes   Weight (read-only)  _0 Reps (read-only)  10-12 10-12 10-12 10-12   Treadmill   MPH (read-only)  _1 Grade (read-only)  0 0 0 0   Minutes (read-only)  10  5 min intervals x _2 min intervals x _3 min intervals x 2 12   Recumbant Bike   Level (read-only)  _4 RPM (read-only)  60 60 60 60   Minutes (read-only)  _5 NuStep   Level (read-only)  _6 4  Watts (read-only)  _0 Minutes (read-only)  _1 02/12/16 1100 02/15/16 1000 02/19/16 1100 02/22/16 1100 03/07/16 1100   Exercise Review   Progression _2    Response to Exercise   Blood Pressure (Admit)    132/68 mmHg    Blood Pressure (Exercise)    126/74 mmHg    Blood Pressure (Exit)    124/62 mmHg    Heart Rate (Admit)    82 bpm    Heart Rate (Exercise)    96 bpm    Heart Rate (Exit)    105 bpm    Oxygen Saturation (Admit)    96 %    Oxygen Saturation (Exercise)    95 %    Oxygen Saturation (Exit)    98 %    Rating of Perceived Exertion (Exercise)    13    Perceived Dyspnea (Exercise)    3    Symptoms _3    Comments Wesley Newton continues to increase in stamina and has progressed on the TM which is his most difficult machine. He reports feeling more confident in his walking and has noticed  the improvement  Saw continues to increase in stamina and has progressed on the TM which is his most difficult machine. Wesley Newton increased his time on the TM. These changes were reviewed with him and he tolerated this increase with no signs or symptoms.  Wesley Newton continues to increase in stamina and has progressed on the TM which is his most difficult machine. Wesley Newton increased his time on the TM. These changes were reviewed with him and he tolerated this increase with no signs or symptoms.  Increases made to the patient's exercise prescription were reviewed with the patient. He tolerated these changes with no signs or sypmtoms.  Increases made to the patient's exercise prescription were reviewed with the patient. He tolerated these changes with no signs or sypmtoms.    Duration Progress to 30 minutes of continuous aerobic without signs/symptoms of physical distress Progress to 30 minutes of continuous aerobic without signs/symptoms of physical distress Progress to 30 minutes of continuous aerobic without signs/symptoms of physical distress Progress to 30 minutes of continuous aerobic without signs/symptoms of physical distress Progress to 30 minutes of continuous aerobic without signs/symptoms of physical distress   Intensity Rest + 30 Rest + 30 Rest + 30 Rest + 30 Rest + 30   Progression   Progression Continue progressive overload as per policy without signs/symptoms or physical distress. Continue progressive overload as per policy without signs/symptoms or physical distress. Continue progressive overload as per policy without signs/symptoms or physical distress. Continue progressive overload as per policy without signs/symptoms or physical distress. Continue progressive overload as per policy without signs/symptoms or physical distress.   Resistance Training   Training Prescription (read-only) Yes Yes      Weight (read-only) 2 3      Reps (read-only) 10-12 10-12      Treadmill   MPH   2.2 2.2 2.2   Grade    0 0    Minutes    15 15   MPH (read-only) 2 2      Grade (read-only) 0 0      Minutes (read-only) 12 15      Recumbant Bike   Level    4 5   RPM    60 65   Watts    15 15   Level (  read-only) 3 3      RPM (read-only) 60 60      Minutes (read-only) 15 15      NuStep   Level    5 5   Watts    50 50   Minutes    15 15   Level (read-only) 4 4      Watts (read-only) 25 25      Minutes (read-only) 15 15        03/14/16 1000 03/18/16 1000 03/25/16 1100       Exercise Review   Progression Yes Yes Yes     Response to Exercise   Symptoms None None None     Comments TM speed has been increased to support Wesley Newton's strength and stamina gains and to continue to progress his aerobic capacity via walking. Making steady increases on the TM.  Making steady increases on the TM.      Duration Progress to 30 minutes of continuous aerobic without signs/symptoms of physical distress Progress to 30 minutes of continuous aerobic without signs/symptoms of physical distress Progress to 30 minutes of continuous aerobic without signs/symptoms of physical distress     Intensity Rest + 30 Rest + 30 Rest + 30     Progression   Progression Continue progressive overload as per policy without signs/symptoms or physical distress. Continue progressive overload as per policy without signs/symptoms or physical distress. Continue progressive overload as per policy without signs/symptoms or physical distress.     Resistance Training   Training Prescription Yes Yes Yes     Weight _0 Reps 10-12 10-12 10-12     Treadmill   MPH 2.4 2.5 2.5     Grade 0 0 0     Minutes _1 Recumbant Bike   Level _2 RPM 65 65 65     Watts _3 NuStep   Level _4 Watts 50 50 75     Minutes _5 Exercise Comments:     Exercise Comments      03/07/16 1104 03/14/16 1113 03/21/16 1517       Exercise Comments Reviewed individualized exercise prescription and made increases per departmental  policy. Exercise increases were discussed with the patient and they were able to perform the new work loads without issue (no signs or symptoms).  Wesley Newton did his mid-evaluation 6 min walk today. He tolerated this assessment well and improved 4% in his walk distance as compared to his initial assessment. Vital signs were all within acceptable ranges during this assessment. See 6 min walk data for detailed report.  Wesley Newton has been exercising at [facility] gym 1 or 2 days/week during [employer]. He uses the TM, NS, and BX for at least 30 mins. He enjoys being active and appreciate the access to a gym at his retirement community.        Discharge Exercise Prescription (Final Exercise Prescription Changes):     Exercise Prescription Changes - 03/25/16 1100    Exercise Review   Progression Yes   Response to Exercise   Symptoms None   Comments Making steady increases on the TM.    Duration Progress to 30 minutes of continuous aerobic without signs/symptoms of physical distress   Intensity Rest + 30   Progression   Progression Continue progressive  overload as per policy without signs/symptoms or physical distress.   Resistance Training   Training Prescription Yes   Weight 3   Reps 10-12   Treadmill   MPH 2.5   Grade 0   Minutes 15   Recumbant Bike   Level 5   RPM 65   Watts 15   NuStep   Level 5   Watts 75   Minutes 15       Nutrition:  Target Goals: Understanding of nutrition guidelines, daily intake of sodium <1568m, cholesterol <2069m calories 30% from fat and 7% or less from saturated fats, daily to have 5 or more servings of fruits and vegetables.  Biometrics:     Pre Biometrics - 01/27/16 1330    Pre Biometrics   Height 5' 5.75" (1.67 m)   Weight 179 lb (81.194 kg)   Waist Circumference 39 inches   Hip Circumference 42.35 inches   Waist to Hip Ratio 0.92 %   BMI (Calculated) 29.2       Nutrition Therapy Plan and Nutrition Goals:     Nutrition Therapy &  Goals - 01/27/16 1416    Intervention Plan   Intervention (read-only) Using nutrition plan and personal goals to gain a healthy nutrition lifestyle. Add exercise as prescribed.      Nutrition Discharge: Rate Your Plate Scores:   Psychosocial: Target Goals: Acknowledge presence or absence of depression, maximize coping skills, provide positive support system. Participant is able to verbalize types and ability to use techniques and skills needed for reducing stress and depression.  Initial Review & Psychosocial Screening:     Initial Psych Review & Screening - 01/27/16 1420    Initial Review   Current issues with Current Sleep Concerns   Family Dynamics   Good Support System? Yes   Comments [facility]. Several neighbors chaeck on him daily. He has a son and a daughter that he sees weekly and another that lives in [place] He is widowed for 5 years. JaElsonill benifit from the class participation and the exercise.    Barriers   Psychosocial barriers to participate in program There are no identifiable barriers or psychosocial needs.;The patient should benefit from training in stress management and relaxation.   Screening Interventions   Interventions Encouraged to exercise      Quality of Life Scores:     Quality of Life - 01/27/16 1558    Quality of Life Scores   Health/Function Pre 10.22 %   Socioeconomic Pre 17.14 %   Psych/Spiritual Pre 19.71 %   Family Pre 24 %   GLOBAL Pre 15.22 %      PHQ-9:     Recent Review Flowsheet Data    [facility] 01/27/2016   Decreased Interest 1   Down, Depressed, Hopeless 1   PHQ - 2 Score 2   Altered sleeping 1   Tired, decreased energy 3   Change in appetite 1   Feeling bad or failure about yourself  0   Trouble concentrating 1   Moving slowly or fidgety/restless 1   Suicidal thoughts 0   PHQ-9 Score 9   Difficult doing work/chores Somewhat difficult       Psychosocial Evaluation and Intervention:     Psychosocial Evaluation - 02/03/16 1045    Psychosocial Evaluation & Interventions   Interventions Encouraged to exercise with the program and follow exercise prescription   Comments Counselor met with  Wesley. Newton today for initial psychosocial evaluation.  He is an 82 year old gentleman who has Pulmonary Fibrosis.  He has a good support system living in a retirement community as well as (2) adult children who live close by and active involvement in his local church community.  Wesley. Newton has some memory issues and struggled a little answering some of  counselor's questions.  He states that he has struggled with depression and anxiety for awhile but is not currently on any medications, although he states the Dr. prescribed something, but his "insurance won't cover it."  He states that his current mood is "okay" but due to his health and some of the medication reactions he has experienced, it "could be better."   He has goals to increase his stamina and strength in this program.  He is sleeping well and has a good appetite.  Wesley. Jokerst plans to supplement working out in this program with exercising in the gym at the retirement community where he resides.  Counselor will continue to follow up with him, especially on his mood.        Psychosocial Re-Evaluation:     Psychosocial Re-Evaluation      03/23/16 1101           Psychosocial Re-Evaluation   Comments Counselor met with Wesley. Newton for a follow up evaluation today.  He continues to struggle with his memory and has some hoarseness that he is seeing a doctor for later this week.  He reports enjoying this program and sleeping well most of the time.  He states his mood is about the same and his health issues continue to be his primary stressor.  Counselor will continue to follow with him while in this program.          Education: Education Goals: Education classes will be provided on a  weekly basis, covering required topics. Participant will state understanding/return demonstration of topics presented.  Learning Barriers/Preferences:     Learning Barriers/Preferences - 01/27/16 1206    Learning Barriers/Preferences   Learning Barriers Hearing;Sight   Learning Preferences None      Education Topics: Initial Evaluation Education: - Verbal, written and demonstration of respiratory meds, RPE/PD scales, oximetry and breathing techniques. Instruction on use of nebulizers and MDIs: cleaning and proper use, rinsing mouth with steroid doses and importance of monitoring MDI activations.          Pulmonary Rehab from 04/04/2016 in [facility] Cardiac and Pulmonary Rehab   Date  01/27/16   Educator  sb   Instruction Review Code  2- meets goals/outcomes      General Nutrition Guidelines/Fats and Fiber: -Group instruction provided by verbal, written material, models and posters to present the general guidelines for heart healthy nutrition. Gives an explanation and review of dietary fats and fiber.   Controlling Sodium/Reading Food Labels: -Group verbal and written material supporting the discussion of sodium use in heart healthy nutrition. Review and explanation with models, verbal and written materials for utilization of the food label.      Pulmonary Rehab from 04/04/2016 in [facility] Cardiac and Pulmonary Rehab   Date  04/04/16   Educator  CR   Instruction Review Code  2- meets goals/outcomes      Exercise Physiology & Risk Factors: - Group verbal and written instruction with models to review the exercise physiology of the cardiovascular system and associated critical values. Details cardiovascular disease risk factors and the goals associated with each risk factor.  Pulmonary Rehab from 04/04/2016 in [facility] Cardiac and Pulmonary Rehab   Date  03/16/16   Educator  SW   Instruction Review Code  2- meets goals/outcomes      Aerobic Exercise & Resistance Training: - Gives group  verbal and written discussion on the health impact of inactivity. On the components of aerobic and resistive training programs and the benefits of this training and how to safely progress through these programs.   Flexibility, Balance, General Exercise Guidelines: - Provides group verbal and written instruction on the benefits of flexibility and balance training programs. Provides general exercise guidelines with specific guidelines to those with heart or lung disease. Demonstration and skill practice provided.      Pulmonary Rehab from 04/04/2016 in [facility] Cardiac and Pulmonary Rehab   Date  02/03/16   Educator  RM   Instruction Review Code  2- meets goals/outcomes      Stress Management: - Provides group verbal and written instruction about the health risks of elevated stress, cause of high stress, and healthy ways to reduce stress.      Pulmonary Rehab from 04/04/2016 in [facility] Cardiac and Pulmonary Rehab   Date  02/10/16   Educator  [facility]   Instruction Review Code  2- meets goals/outcomes      Depression: - Provides group verbal and written instruction on the correlation between heart/lung disease and depressed mood, treatment options, and the stigmas associated with seeking treatment.      Pulmonary Rehab from 04/04/2016 in [facility] Cardiac and Pulmonary Rehab   Date  03/23/16   Educator  [facility]   Instruction Review Code  2- meets goals/outcomes      Exercise & Equipment Safety: - Individual verbal instruction and demonstration of equipment use and safety with use of the equipment.      Pulmonary Rehab from 04/04/2016 in [facility] Cardiac and Pulmonary Rehab   Date  01/27/16   Educator  Sb   Instruction Review Code  2- meets goals/outcomes      Infection Prevention: - Provides verbal and written material to individual with discussion of infection control including proper hand washing and proper equipment cleaning during exercise session.      Pulmonary Rehab from 04/04/2016 in [facility] Cardiac and  Pulmonary Rehab   Date  01/27/16   Educator  SB   Instruction Review Code  2- meets goals/outcomes      Falls Prevention: - Provides verbal and written material to individual with discussion of falls prevention and safety.      Pulmonary Rehab from 04/04/2016 in [facility] Cardiac and Pulmonary Rehab   Date  01/27/16   Educator  SB   Instruction Review Code  2- meets goals/outcomes      Diabetes: - Individual verbal and written instruction to review signs/symptoms of diabetes, desired ranges of glucose level fasting, after meals and with exercise. Advice that pre and post exercise glucose checks will be done for 3 sessions at entry of program.   Chronic Lung Diseases: - Group verbal and written instruction to review new updates, new respiratory medications, new advancements in procedures and treatments. Provide informative websites and "800" numbers of self-education.      Pulmonary Rehab from 04/04/2016 in [facility] Cardiac and Pulmonary Rehab   Date  02/01/16   Educator  L. Owens Shark, RT   Instruction Review Code  2- meets goals/outcomes      Lung Procedures: - Group verbal and written instruction to describe testing methods done to diagnose lung disease. Review  the outcome of test results. Describe the treatment choices: Pulmonary Function Tests, ABGs and oximetry.   Energy Conservation: - Provide group verbal and written instruction for methods to conserve energy, plan and organize activities. Instruct on pacing techniques, use of adaptive equipment and posture/positioning to relieve shortness of breath.   Triggers: - Group verbal and written instruction to review types of environmental controls: home humidity, furnaces, filters, dust mite/pet prevention, HEPA vacuums. To discuss weather changes, air quality and the benefits of nasal washing.   Exacerbations: - Group verbal and written instruction to provide: warning signs, infection symptoms, calling MD promptly, preventive modes, and  value of vaccinations. Review: effective airway clearance, coughing and/or vibration techniques. Create an Sports administrator.      Pulmonary Rehab from 04/04/2016 in [facility] Cardiac and Pulmonary Rehab   Date  02/29/16   Educator  LB   Instruction Review Code  2- meets goals/outcomes      Oxygen: - Individual and group verbal and written instruction on oxygen therapy. Includes supplement oxygen, available portable oxygen systems, continuous and intermittent flow rates, oxygen safety, concentrators, and Medicare reimbursement for oxygen.   Respiratory Medications: - Group verbal and written instruction to review medications for lung disease. Drug class, frequency, complications, importance of spacers, rinsing mouth after steroid MDI's, and proper cleaning methods for nebulizers.   AED/CPR: - Group verbal and written instruction with the use of models to demonstrate the basic use of the AED with the basic ABC's of resuscitation.      Pulmonary Rehab from 04/04/2016 in [facility] Pulmonary Rehab   Date  02/19/16   Educator  CE   Instruction Review Code  2- meets goals/outcomes      Breathing Retraining: - Provides individuals verbal and written instruction on purpose, frequency, and proper technique of diaphragmatic breathing and pursed-lipped breathing. Applies individual practice skills.      Pulmonary Rehab from 04/04/2016 in [facility] Cardiac and Pulmonary Rehab   Date  01/27/16   Educator  sB   Instruction Review Code  2- meets goals/outcomes      Anatomy and Physiology of the Lungs: - Group verbal and written instruction with the use of models to provide basic lung anatomy and physiology related to function, structure and complications of lung disease.      Pulmonary Rehab from 04/04/2016 in [facility] Cardiac and Pulmonary Rehab   Date  03/11/16   Educator  SJ RRT   Instruction Review Code  2- meets goals/outcomes      Heart Failure: - Group verbal and written instruction on the basics  of heart failure: signs/symptoms, treatments, explanation of ejection fraction, enlarged heart and cardiomyopathy.   Sleep Apnea: - Individual verbal and written instruction to review Obstructive Sleep Apnea. Review of risk factors, methods for diagnosing and types of masks and machines for OSA.   Anxiety: - Provides group, verbal and written instruction on the correlation between heart/lung disease and anxiety, treatment options, and management of anxiety.   Relaxation: - Provides group, verbal and written instruction about the benefits of relaxation for patients with heart/lung disease. Also provides patients with examples of relaxation techniques.      Pulmonary Rehab from 04/04/2016 in [facility] Cardiac and Pulmonary Rehab   Date  02/24/16   Educator  [facility]   Instruction Review Code  2- Meets goals/outcomes      Knowledge Questionnaire Score:     Knowledge Questionnaire Score - 01/27/16 1403    Knowledge Questionnaire Score   Pre  Score 6/10       Core Components/Risk Factors/Patient Goals at Admission:     Personal Goals and Risk Factors at Admission - 01/27/16 1422    Core Components/Risk Factors/Patient Goals on Admission   Sedentary Yes  Roel utilizes the Treadmill and weight machine at his residence. He tries 3-5 days a week if he is feeling well. His goal is to stay active.  He has recently had concerns with "dizziness" and he was advised to talk to his PMD about the symptoms.    Intervention (read-only) While in program, learn and follow the exercise prescription taught. Start at a low level workload and increase workload after able to maintain previous level for 30 minutes. Increase time before increasing intensity.   Improve shortness of breath with ADL's Yes  Wesley Newton scored some fear with SOB, he stated he has SOB with exertion, that has learned to space his activities to be less SOB   Intervention (read-only) While in program, learn and follow the exercise prescription  taught. Start at a low level workload and increase workload ad advised by the exercise physiologist. Increase time before increasing intensity.   Develop more efficient breathing techniques such as purse lipped breathing and diaphragmatic breathing; and practicing self-pacing with activity Yes  Wesley Newton instructed on purse lipped breathing and he had great return demonstration.   Intervention (read-only) While in program, learn and utilize the specific breathing techniques taught to you. Continue to practice and use the techniques as needed.   Increase knowledge of respiratory medications and ability to use respiratory devices properly  Yes  Wesley Newton has ventolin as needed, new nebuliser to use. Symbicort inhaler also.  Wesley Newton did not have a spacer, will provide him with one and instruction for proper use next visit.   Intervention (read-only) While in program learn and demonstrate appropriate use of your oxygen therapy by increasing flow with exertion, manage oxygen tank operation, including continuous and intermittent flow.  Understanding oxygen is a drug ordered by your physician.   Hypertension Yes   Goal Participant will see blood pressure controlled within the values of 140/71m/Hg or within value directed by their physician.   Intervention (read-only) Provide nutrition & aerobic exercise along with prescribed medications to achieve BP 140/90 or less.   Lipids Yes   Goal Cholesterol controlled with medications as prescribed, with individualized exercise RX and with personalized nutrition plan. Value goals: LDL < '70mg'$ , HDL > '40mg'$ . Participant states understanding of desired cholesterol values and following prescriptions.   Intervention (read-only) Provide nutrition & aerobic exercise along with prescribed medications to achieve LDL '70mg'$ , HDL >'40mg'$ .   Personal Goal Other Yes   Personal Goal Stay Active   Intervention Follow exercise prescription guidelines to gradually increase activity levels without  increase in SOB.    Understand more about Heart/Pulmonary Disease. Yes  Wesley Newton came to the program with a diagnosis of Asthma and Sleep apnea.  Wesley Newton interest in learning more about the disease process and what he can do to keep it controlled.      Intervention While in program utilize professionals for any questions, and attend the education sessions. Great websites to use are www.americanheart.org or www.lung.org for reliable information.  Wesley Newton has stoppped using the CPAP,because he tried every method available to use the machine and he did not tolerate any method.      Core Components/Risk Factors/Patient Goals Review:      Goals and Risk Factor Review      02/03/16 1000 02/22/16  1000 02/29/16 1000 03/23/16 1624     Core Components/Risk Factors/Patient Goals Review   Personal Goals Review Develop more efficient breathing techniques such as purse lipped breathing and diaphragmatic breathing and practicing self-pacing with activity. Sedentary;Improve shortness of breath with ADL's;Increase knowledge of respiratory medications and ability to use respiratory devices properly.;Develop more efficient breathing techniques such as purse lipped breathing and diaphragmatic breathing and practicing self-pacing with activity. Increase knowledge of respiratory medications and ability to use respiratory devices properly. Improve shortness of breath with ADL's;Increase knowledge of respiratory medications and ability to use respiratory devices properly.;Develop more efficient breathing techniques such as purse lipped breathing and diaphragmatic breathing and practicing self-pacing with activity.    Review  Wesley Newton states he has more energy, although he still has increased shortness of breath; he is using PLB and does find that technique helpful; Gave Wesley Newton a aerochamber for his Ventolin and Symbicort Wesley Newton is experiencing horseness. We discussed that his Symbicort may be causing this, and at  his appointment with Dr Raul Del tomorrow, I suggested he mention this to Dr Raul Del.   He could possibly order another inhaler  which would not cause this side effect. Wesley Newton continues to use his MDI's as prescribed and his aerochamber with his albuterol and Symbicort; He manages his shortness of breath with PLB and pacing which he uses at the [facility] gym as well.    Expected Outcomes  Continue to improve energy and stamina with his exercise goals; follow-up on aerochamber use.  Continue progressing with his exercise goals and learning management of his lung condition.    Breathing Techniques (read-only)   Goals Progress/Improvement seen  Yes       Comments Reviewed PLB with Wesley Dobie and encouraged him to use this technique with his exercise goals.          Core Components/Risk Factors/Patient Goals at Discharge (Final Review):      Goals and Risk Factor Review - 03/23/16 1624    Core Components/Risk Factors/Patient Goals Review   Personal Goals Review Improve shortness of breath with ADL's;Increase knowledge of respiratory medications and ability to use respiratory devices properly.;Develop more efficient breathing techniques such as purse lipped breathing and diaphragmatic breathing and practicing self-pacing with activity.   Review Wesley Greenhaw continues to use his MDI's as prescribed and his aerochamber with his albuterol and Symbicort; He manages his shortness of breath with PLB and pacing which he uses at the [facility] gym as well.   Expected Outcomes Continue progressing with his exercise goals and learning management of his lung condition.      ITP Comments:     ITP Comments      01/27/16 1409 01/27/16 1411 02/03/16 1000 02/03/16 1053 02/05/16 1056   ITP Comments Today Barnabas Lister attended his orientation. Program and personal goals were reviewed with Barnabas Lister.  Expectationis that Rondell will meet his goals at the end of the 36 session. Review will be done during his program to set  short term goals as needed.  At the end of the 6 minute walk, Marquette experienced an increase in his "dizziness" . This got better after a couple cups of water and sitting, but did not totally resolve. after discussion with jacj, I took him by wheelchair to the [facility] at his primary MD office. Tiron will be evaluated and treated at the clinic. BP was fine, Blood sugar check  was 90m/Dl.  Wesley FGalikcompleted his first exercise day in [facility].  He reached his goals and did very well and will progress in the program. Zacharias is expected to meet his goals by end of the program in 35 sessions.  Personal and exercise goals expected to be met in 33 more sessions. Progress on specific individualized goals will be charted in patient's ITP. Upon completion of the program the patient will be comfortable managing exercise goals and progression on their own.      02/08/16 1057 02/10/16 1107 02/12/16 1059 02/15/16 1041 02/24/16 1102   ITP Comments Personal and exercise goals expected to be met in 32 more sessions. Progress on specific individualized goals will be charted in patient's ITP. Upon completion of the program the patient will be comfortable managing exercise goals and progression on their own.  Personal and exercise goals expected to be met in 31 more sessions. Progress on specific individualized goals will be charted in patient's ITP. Upon completion of the program the patient will be comfortable managing exercise goals and progression on their own.  Personal and exercise goals expected to be met in 30 more sessions. Progress on specific individualized goals will be charted in patient's ITP. Upon completion of the program the patient will be comfortable managing exercise goals and progression on their own.  Personal and exercise goals expected to be met in 29 more sessiosns. See ITP for specific notes no goal progression.  Progress on specific individualized goals will be charted in patient's ITP. Upon  completion of the program the patient will be comfortable managing exercise goals and progression on their own.      03/02/16 1452 03/09/16 1000 03/23/16 1000 03/28/16 1034     ITP Comments Wesley Bruington was started on an antibiotic by Dr Raul Del and referred to an Ear, Nose, Throat specialist. Wesley Siebert did not feel well today. He did take 2 puffs on his Ventolin MDI, but still decided to omit exercise for the day. Wesley Stavros has an appointment with Dr Virgia Land for his cough on 03/25/2016. Everett reported that on Friday the doctor ran that light down his throat. He said they will increase his reflux medicine. Kebin reports he tried the nasal spray they gave him but he "can't take it since it makes him dizzy and he didn't feel well.        Comments: 30 Day Review

## 2016-04-06 ENCOUNTER — Encounter: Payer: Medicare Other | Admitting: *Deleted

## 2016-04-06 DIAGNOSIS — G4730 Sleep apnea, unspecified: Secondary | ICD-10-CM

## 2016-04-06 DIAGNOSIS — J45909 Unspecified asthma, uncomplicated: Secondary | ICD-10-CM

## 2016-04-06 DIAGNOSIS — J45998 Other asthma: Secondary | ICD-10-CM | POA: Diagnosis not present

## 2016-04-06 NOTE — Progress Notes (Signed)
Daily Session Note  Patient Details  Name: Wesley Newton MRN: [id number] Date of Birth: 02/22/1927 Referring Provider:    Encounter Date: 04/06/2016  Check In:     Session Check In - 04/06/16 1105    Check-In   Staff Present Susanne Bice, RN, BSN, CCRP;Rebecca Sickles, DPT, CEEA;Laureen Brown, BS, RRT, Respiratory Therapist   Supervising physician immediately available to respond to emergencies [facility] immediately available ER MD   Physician(s) Drs: Shaevitz and Gayle   Medication changes reported     No   Fall or balance concerns reported    No   Warm-up and Cool-down Performed on first and last piece of equipment   VAD Patient? No   Pain Assessment   Currently in Pain? No/denies           Exercise Prescription Changes - 04/06/16 1100    Response to Exercise   Comments Exericse increases were discussed with patient and he tolerated them with no signs or symptoms.    Intensity Rest + 30   Progression   Progression Continue progressive overload as per policy without signs/symptoms or physical distress.   Resistance Training   Training Prescription Yes   Weight 3   Reps 10-12   Treadmill   MPH 2.7   Grade 0   Minutes 15   Recumbant Bike   Level 6   RPM 63   Watts 15   NuStep   Level 6   Watts 60   Minutes 15      Goals Met:  Independence with exercise equipment Exercise tolerated well Personal goals reviewed Strength training completed today  Goals Unmet:  Not Applicable  Comments: Doing well with exercise prescription progression.    Dr. Mark Miller is Medical Director for [facility] and [facility]. 

## 2016-04-08 ENCOUNTER — Encounter: Payer: Medicare Other | Admitting: *Deleted

## 2016-04-08 DIAGNOSIS — G4730 Sleep apnea, unspecified: Secondary | ICD-10-CM

## 2016-04-08 DIAGNOSIS — J45909 Unspecified asthma, uncomplicated: Secondary | ICD-10-CM

## 2016-04-08 NOTE — Progress Notes (Signed)
Pulmonary Individual Treatment Plan  Patient Details  Name: Wesley Newton MRN: [id number] Date of Birth: [birth year] Referring Provider:    Initial Encounter Date:       Pulmonary Rehab from 01/27/2016 in [facility] Cardiac and Pulmonary Rehab   Date  01/27/16      Visit Diagnosis: Asthma, chronic, unspecified asthma severity, uncomplicated  Sleep apnea  Patient's Home Medications on Admission:  Current outpatient prescriptions:  .  albuterol (PROVENTIL HFA;VENTOLIN HFA) 108 (90 Base) MCG/ACT inhaler, Inhale 2 puffs into the lungs every 6 (six) hours as needed for wheezing or shortness of breath., Disp: , Rfl:  .  albuterol (PROVENTIL) (2.5 MG/3ML) 0.083% nebulizer solution, Take 2.5 mg by nebulization every 6 (six) hours as needed for wheezing or shortness of breath., Disp: , Rfl:  .  aspirin EC 81 MG tablet, Take 81 mg by mouth daily. , Disp: , Rfl:  .  budesonide-formoterol (SYMBICORT) 160-4.5 MCG/ACT inhaler, Inhale 2 puffs into the lungs 2 (two) times daily., Disp: , Rfl:  .  finasteride (PROSCAR) 5 MG tablet, Take 1 tablet (5 mg total) by mouth daily., Disp: 30 tablet, Rfl: 3 .  fluticasone (FLONASE) 50 MCG/ACT nasal spray, Place into the nose., Disp: , Rfl:  .  ipratropium-albuterol (DUONEB) 0.5-2.5 (3) MG/3ML SOLN, Inhale into the lungs., Disp: , Rfl:  .  loratadine (ALLERGY) 10 MG dissolvable tablet, Take by mouth., Disp: , Rfl:  .  Multiple Vitamins-Minerals (PRESERVISION AREDS 2 PO), Take 1 capsule by mouth 2 (two) times daily., Disp: , Rfl:  .  naproxen (NAPROSYN) 375 MG tablet, Take 375 mg by mouth 3 (three) times daily as needed for mild pain. , Disp: , Rfl: 2 .  omeprazole (PRILOSEC) 40 MG capsule, Take 40 mg by mouth daily. , Disp: , Rfl: 4 .  simvastatin (ZOCOR) 40 MG tablet, Take 40 mg by mouth at bedtime. , Disp: , Rfl: 0 .  tamsulosin (FLOMAX) 0.4 MG CAPS capsule, Take 1 capsule (0.4 mg total) by mouth daily., Disp: 30 capsule, Rfl: 3 .  vitamin B-12 (CYANOCOBALAMIN) 1000  MCG tablet, Take 1,000 mcg by mouth daily., Disp: , Rfl:   Past Medical History: Past Medical History  Diagnosis Date  . Cancer ([place])   . DVT of leg (deep venous thrombosis) ([place])   . Emphysema lung ([place])   . Elevated PSA   . BPH (benign prostatic hyperplasia)   . COPD (chronic obstructive pulmonary disease) ([place])   . Nocturia   . Pulmonary fibrosis ([facility])     Tobacco Use: History  Smoking status  . Former Smoker  Smokeless tobacco  . Not on file    Comment: quit 40 years    Labs: Recent Review Flowsheet Data    There is no flowsheet data to display.       ADL [facility]SD:     Pulmonary Assessment Scores      01/27/16 1136 01/27/16 1406     ADL UCSD   ADL Phase Entry     SOB Score total  60    Rest  0    Walk  1    Stairs  3    Bath  4    Dress  4    Shop  2       Pulmonary Function Assessment:     Pulmonary Function Assessment - 01/27/16 1300    Initial Spirometry Results   FVC% 106 %   FEV1% 134 %   FEV1/FVC Ratio 95.65   Breath  Bilateral Breath Sounds R[place];Basilar;Inspiratory   Shortness of Breath Yes;Limiting activity      Exercise Target Goals:    Exercise Program Goal: Individual exercise prescription set with THRR, safety & activity barriers. Participant demonstrates ability to understand and report RPE using BORG scale, to self-measure pulse accurately, and to acknowledge the importance of the exercise prescription.  Exercise Prescription Goal: Starting with aerobic activity 30 plus minutes a day, 3 days per week for initial exercise prescription. Provide home exercise prescription and guidelines that participant acknowledges understanding prior to discharge.  Activity Barriers & Risk Stratification:     Activity Barriers & Cardiac Risk Stratification - 01/27/16 1205    Activity Barriers & Cardiac Risk Stratification   Activity Barriers Balance Concerns;Shortness of Breath;Deconditioning      6 Minute Walk:     6 Minute Walk       01/27/16 1331 03/14/16 1109     6 Minute Walk   Phase Initial Mid Program    Distance 1245 feet 1300 feet    Distance % Change  4 %    Walk Time 6 minutes 6 minutes    # of Rest Breaks 0     MPH 2.4     RPE 12 13    Perceived Dyspnea  2 3    Symptoms Yes (comment) No    Comments Light headed after walking, blurred vision, short of breath     Resting HR 90 bpm 70 bpm    Resting BP 128/68 mmHg 124/72 mmHg    Max Ex. HR 116 bpm 94 bpm    Max Ex. BP 150/60 mmHg 148/74 mmHg       Initial Exercise Prescription:     Initial Exercise Prescription - 01/27/16 1300    Date of Initial Exercise RX and Referring Provider   Date 01/27/16   Treadmill   MPH 2   Grade 0   Minutes 10  Use intervals if necessary such as 2, 5 min intervals   Bike   Level 0.4   Watts 12   Minutes 10   Recumbant Bike   Level 3   RPM 40   Watts 25   Minutes 10   NuStep   Level 2   Watts 25   Minutes 10   Arm Ergometer   Level 1   Watts 8   Minutes 10   Arm/Foot Ergometer   Level 4   Watts 12   Minutes 10   Cybex   Level 2   RPM 50   Minutes 10   Recumbant Elliptical   Level 1   RPM 35   Watts 10   Minutes 10   REL-XR   Level 2   Watts 35   Minutes 10   T5 Nustep   Level 1   Watts 10   Minutes 10   Biostep-RELP   Level 2   Watts 15   Minutes 10   Prescription Details   Frequency (times per week) 3   Duration Progress to 30 minutes of continuous aerobic without signs/symptoms of physical distress   Intensity   THRR REST +  30   Ratings of Perceived Exertion 11-13   Perceived Dyspnea 2-4   Progression   Progression Continue progressive overload as per policy without signs/symptoms or physical distress.   Resistance Training   Training Prescription Yes   Weight 2   Reps 10-12      Perform Capillary Blood Glucose checks as needed.  Exercise  Prescription Changes:     Exercise Prescription Changes      01/27/16 1100 02/03/16 1200 02/05/16 1000 02/08/16 1000 02/10/16  1100   Exercise Review   Progression   Yes Yes Yes   Response to Exercise   Blood Pressure (Admit) 128/68 mmHg 122/70 mmHg      Blood Pressure (Exercise) 150/60 mmHg 136/60 mmHg      Blood Pressure (Exit) 128/62 mmHg 124/76 mmHg      Heart Rate (Admit) 90 bpm 92 bpm      Heart Rate (Exercise) 116 bpm 111 bpm      Heart Rate (Exit) 96 bpm 104 bpm      Oxygen Saturation (Admit) 96 % 95 %      Oxygen Saturation (Exercise) 90 % 92 %      Oxygen Saturation (Exit) 96 % 97 %      Rating of Perceived Exertion (Exercise)  11      Perceived Dyspnea (Exercise)  3      Symptoms  Heavy cough, he says it is chronic None None None   Comments  First day of exercise! Patient was oriented to the gym and the equipment functions and settings. Procedures and policies of the gym were outlined and explained. The patient's individual exercise prescription and treatment plan were reviewed with them. All starting workloads were established based on the results of the functional testing  done at the initial intake visit. The plan for exercise progression was also introduced and progression will be customized based on the patient's performance and goals Reviewed individualized exercise prescription and made increases per departmental policy. Exercise increases were discussed with the patient and they were able to perform the new work loads without issue (no signs or symptoms).  Press's stamina is increasing and this is reflected in longer exercise times. He is not quite exercising for the full class time. Jeferson continues to increase in stamina and has progressed on the TM which is his most difficult machine. He reports feeling more confident in his walking and has noticed the improvement    Duration  Progress to 30 minutes of continuous aerobic without signs/symptoms of physical distress Progress to 30 minutes of continuous aerobic without signs/symptoms of physical distress Progress to 30 minutes of continuous aerobic without  signs/symptoms of physical distress Progress to 30 minutes of continuous aerobic without signs/symptoms of physical distress   Intensity  Rest + 30 Rest + 30 Rest + 30 Rest + 30   Progression   Progression  Continue progressive overload as per policy without signs/symptoms or physical distress. Continue progressive overload as per policy without signs/symptoms or physical distress. Continue progressive overload as per policy without signs/symptoms or physical distress. Continue progressive overload as per policy without signs/symptoms or physical distress.   Resistance Training   Training Prescription (read-only)  Yes Yes Yes Yes   Weight (read-only)  _0 Reps (read-only)  10-12 10-12 10-12 10-12   Treadmill   MPH (read-only)  _1 Grade (read-only)  0 0 0 0   Minutes (read-only)  10  5 min intervals x _2 min intervals x _3 min intervals x 2 12   Recumbant Bike   Level (read-only)  _4 RPM (read-only)  60 60 60 60   Minutes (read-only)  _5 NuStep   Level (read-only)  _6 4  Watts (read-only)  _0 Minutes (read-only)  _1 02/12/16 1100 02/15/16 1000 02/19/16 1100 02/22/16 1100 03/07/16 1100   Exercise Review   Progression _2    Response to Exercise   Blood Pressure (Admit)    132/68 mmHg    Blood Pressure (Exercise)    126/74 mmHg    Blood Pressure (Exit)    124/62 mmHg    Heart Rate (Admit)    82 bpm    Heart Rate (Exercise)    96 bpm    Heart Rate (Exit)    105 bpm    Oxygen Saturation (Admit)    96 %    Oxygen Saturation (Exercise)    95 %    Oxygen Saturation (Exit)    98 %    Rating of Perceived Exertion (Exercise)    13    Perceived Dyspnea (Exercise)    3    Symptoms _3    Comments Derrill continues to increase in stamina and has progressed on the TM which is his most difficult machine. He reports feeling more confident in his walking and has noticed the improvement  Milon  continues to increase in stamina and has progressed on the TM which is his most difficult machine. Ananias increased his time on the TM. These changes were reviewed with him and he tolerated this increase with no signs or symptoms.  Blayke continues to increase in stamina and has progressed on the TM which is his most difficult machine. Demontrae increased his time on the TM. These changes were reviewed with him and he tolerated this increase with no signs or symptoms.  Increases made to the patient's exercise prescription were reviewed with the patient. He tolerated these changes with no signs or sypmtoms.  Increases made to the patient's exercise prescription were reviewed with the patient. He tolerated these changes with no signs or sypmtoms.    Duration Progress to 30 minutes of continuous aerobic without signs/symptoms of physical distress Progress to 30 minutes of continuous aerobic without signs/symptoms of physical distress Progress to 30 minutes of continuous aerobic without signs/symptoms of physical distress Progress to 30 minutes of continuous aerobic without signs/symptoms of physical distress Progress to 30 minutes of continuous aerobic without signs/symptoms of physical distress   Intensity Rest + 30 Rest + 30 Rest + 30 Rest + 30 Rest + 30   Progression   Progression Continue progressive overload as per policy without signs/symptoms or physical distress. Continue progressive overload as per policy without signs/symptoms or physical distress. Continue progressive overload as per policy without signs/symptoms or physical distress. Continue progressive overload as per policy without signs/symptoms or physical distress. Continue progressive overload as per policy without signs/symptoms or physical distress.   Resistance Training   Training Prescription (read-only) Yes Yes      Weight (read-only) 2 3      Reps (read-only) 10-12 10-12      Treadmill   MPH   2.2 2.2 2.2   Grade    0 0   Minutes    15 15    MPH (read-only) 2 2      Grade (read-only) 0 0      Minutes (read-only) 12 15      Recumbant Bike   Level    4 5   RPM    60 65   Watts    15 15   Level (  read-only) 3 3      RPM (read-only) 60 60      Minutes (read-only) 15 15      NuStep   Level    5 5   Watts    50 50   Minutes    15 15   Level (read-only) 4 4      Watts (read-only) 25 25      Minutes (read-only) 15 15        03/14/16 1000 03/18/16 1000 03/25/16 1100 04/04/16 1200 04/06/16 1100   Exercise Review   Progression Yes Yes Yes     Response to Exercise   Blood Pressure (Admit)    110/60 mmHg    Blood Pressure (Exercise)    130/84 mmHg    Blood Pressure (Exit)    122/50 mmHg    Heart Rate (Admit)    83 bpm    Heart Rate (Exercise)    107 bpm    Heart Rate (Exit)    87 bpm    Oxygen Saturation (Admit)    95 %    Oxygen Saturation (Exercise)    92 %    Oxygen Saturation (Exit)    96 %    Rating of Perceived Exertion (Exercise)    15    Perceived Dyspnea (Exercise)    4    Symptoms None None None None    Comments TM speed has been increased to support Omarian's strength and stamina gains and to continue to progress his aerobic capacity via walking. Making steady increases on the TM.  Making steady increases on the TM.  Exericse increases were discussed with patient and he tolerated them with no signs or symptoms.  Exericse increases were discussed with patient and he tolerated them with no signs or symptoms.    Duration Progress to 30 minutes of continuous aerobic without signs/symptoms of physical distress Progress to 30 minutes of continuous aerobic without signs/symptoms of physical distress Progress to 30 minutes of continuous aerobic without signs/symptoms of physical distress Progress to 45 minutes of aerobic exercise without signs/symptoms of physical distress    Intensity Rest + 30 Rest + 30 Rest + 30 Rest + 30 Rest + 30   Progression   Progression Continue progressive overload as per policy without signs/symptoms or  physical distress. Continue progressive overload as per policy without signs/symptoms or physical distress. Continue progressive overload as per policy without signs/symptoms or physical distress. Continue progressive overload as per policy without signs/symptoms or physical distress. Continue progressive overload as per policy without signs/symptoms or physical distress.   Resistance Training   Training Prescription _0    Weight _1 Reps 10-12 10-12 10-12 10-12 10-12   Treadmill   MPH 2.4 2.5 2.5 2.5 2.7   Grade 0 0 0 0 0   Minutes _2 Recumbant Bike   Level _3 RPM 65 65 65 63 63   Watts _4 NuStep   Level _5 Watts 50 50 75 75 60   Minutes _6 Exercise Comments:     Exercise Comments      03/07/16 1104 03/14/16 1113 03/21/16 1517       Exercise Comments Reviewed individualized exercise prescription and made increases per departmental policy. Exercise increases were discussed with the  patient and they were able to perform the new work loads without issue (no signs or symptoms).  Kaire did his mid-evaluation 6 min walk today. He tolerated this assessment well and improved 4% in his walk distance as compared to his initial assessment. Vital signs were all within acceptable ranges during this assessment. See 6 min walk data for detailed report.  Mr Kawecki has been exercising at [facility] gym 1 or 2 days/week during [facility]. He uses the TM, NS, and BX for at least 30 mins. He enjoys being active and appreciate the access to a gym at his retirement community.        Discharge Exercise Prescription (Final Exercise Prescription Changes):     Exercise Prescription Changes - 04/06/16 1100    Response to Exercise   Comments Exericse increases were discussed with patient and he tolerated them with no signs or symptoms.    Intensity Rest + 30   Progression   Progression Continue progressive overload as  per policy without signs/symptoms or physical distress.   Resistance Training   Training Prescription Yes   Weight 3   Reps 10-12   Treadmill   MPH 2.7   Grade 0   Minutes 15   Recumbant Bike   Level 6   RPM 63   Watts 15   NuStep   Level 6   Watts 60   Minutes 15       Nutrition:  Target Goals: Understanding of nutrition guidelines, daily intake of sodium <1542m, cholesterol <2048m calories 30% from fat and 7% or less from saturated fats, daily to have 5 or more servings of fruits and vegetables.  Biometrics:     Pre Biometrics - 01/27/16 1330    Pre Biometrics   Height 5' 5.75" (1.67 m)   Weight 179 lb (81.194 kg)   Waist Circumference 39 inches   Hip Circumference 42.35 inches   Waist to Hip Ratio 0.92 %   BMI (Calculated) 29.2       Nutrition Therapy Plan and Nutrition Goals:     Nutrition Therapy & Goals - 01/27/16 1416    Intervention Plan   Intervention (read-only) Using nutrition plan and personal goals to gain a healthy nutrition lifestyle. Add exercise as prescribed.      Nutrition Discharge: Rate Your Plate Scores:   Psychosocial: Target Goals: Acknowledge presence or absence of depression, maximize coping skills, provide positive support system. Participant is able to verbalize types and ability to use techniques and skills needed for reducing stress and depression.  Initial Review & Psychosocial Screening:     Initial Psych Review & Screening - 01/27/16 1420    Initial Review   Current issues with Current Sleep Concerns   Family Dynamics   Good Support System? Yes   Comments [place] [facility]. Several neighbors chaeck on him daily. He has a son and a daughter that he sees weekly and another that lives in [place] He is widowed for 5 years. JaAloysiusill benifit from the class participation and the exercise.    Barriers   Psychosocial barriers to participate in program There are no identifiable  barriers or psychosocial needs.;The patient should benefit from training in stress management and relaxation.   Screening Interventions   Interventions Encouraged to exercise      Quality of Life Scores:     Quality of Life - 01/27/16 1558    Quality of Life Scores  Health/Function Pre 10.22 %   Socioeconomic Pre 17.14 %   Psych/Spiritual Pre 19.71 %   Family Pre 24 %   GLOBAL Pre 15.22 %      PHQ-9:     Recent Review Flowsheet Data    Depression screen [facility] 2/9 01/27/2016   Decreased Interest 1   Down, Depressed, Hopeless 1   PHQ - 2 Score 2   Altered sleeping 1   Tired, decreased energy 3   Change in appetite 1   Feeling bad or failure about yourself  0   Trouble concentrating 1   Moving slowly or fidgety/restless 1   Suicidal thoughts 0   PHQ-9 Score 9   Difficult doing work/chores Somewhat difficult      Psychosocial Evaluation and Intervention:     Psychosocial Evaluation - 02/03/16 1045    Psychosocial Evaluation & Interventions   Interventions Encouraged to exercise with the program and follow exercise prescription   Comments Counselor met with Mr. Oyama today for initial psychosocial evaluation.  He is an 82 year old gentleman who has Pulmonary Fibrosis.  He has a good support system living in a retirement community as well as (2) adult children who live close by and active involvement in his local church community.  Mr. Gift has some memory issues and struggled a little answering some of  counselor's questions.  He states that he has struggled with depression and anxiety for awhile but is not currently on any medications, although he states the Dr. prescribed something, but his "insurance won't cover it."  He states that his current mood is "okay" but due to his health and some of the medication reactions he has experienced, it "could be better."   He has goals to increase his stamina and strength in this program.  He is sleeping well and has a good  appetite.  Mr. Harvel plans to supplement working out in this program with exercising in the gym at the retirement community where he resides.  Counselor will continue to follow up with him, especially on his mood.        Psychosocial Re-Evaluation:     Psychosocial Re-Evaluation      03/23/16 1101           Psychosocial Re-Evaluation   Comments Counselor met with Mr. Ofallon for a follow up evaluation today.  He continues to struggle with his memory and has some hoarseness that he is seeing a doctor for later this week.  He reports enjoying this program and sleeping well most of the time.  He states his mood is about the same and his health issues continue to be his primary stressor.  Counselor will continue to follow with him while in this program.          Education: Education Goals: Education classes will be provided on a weekly basis, covering required topics. Participant will state understanding/return demonstration of topics presented.  Learning Barriers/Preferences:     Learning Barriers/Preferences - 01/27/16 1206    Learning Barriers/Preferences   Learning Barriers Hearing;Sight   Learning Preferences None      Education Topics: Initial Evaluation Education: - Verbal, written and demonstration of respiratory meds, RPE/PD scales, oximetry and breathing techniques. Instruction on use of nebulizers and MDIs: cleaning and proper use, rinsing mouth with steroid doses and importance of monitoring MDI activations.          Pulmonary Rehab from 04/04/2016 in [facility] Cardiac and Pulmonary Rehab   Date  01/27/16  Educator  sb   Instruction Review Code  2- meets goals/outcomes      General Nutrition Guidelines/Fats and Fiber: -Group instruction provided by verbal, written material, models and posters to present the general guidelines for heart healthy nutrition. Gives an explanation and review of dietary fats and fiber.   Controlling Sodium/Reading Food Labels: -Group  verbal and written material supporting the discussion of sodium use in heart healthy nutrition. Review and explanation with models, verbal and written materials for utilization of the food label.      Pulmonary Rehab from 04/04/2016 in [facility] Cardiac and Pulmonary Rehab   Date  04/04/16   Educator  CR   Instruction Review Code  2- meets goals/outcomes      Exercise Physiology & Risk Factors: - Group verbal and written instruction with models to review the exercise physiology of the cardiovascular system and associated critical values. Details cardiovascular disease risk factors and the goals associated with each risk factor.      Pulmonary Rehab from 04/04/2016 in [facility] Cardiac and Pulmonary Rehab   Date  03/16/16   Educator  SW   Instruction Review Code  2- meets goals/outcomes      Aerobic Exercise & Resistance Training: - Gives group verbal and written discussion on the health impact of inactivity. On the components of aerobic and resistive training programs and the benefits of this training and how to safely progress through these programs.   Flexibility, Balance, General Exercise Guidelines: - Provides group verbal and written instruction on the benefits of flexibility and balance training programs. Provides general exercise guidelines with specific guidelines to those with heart or lung disease. Demonstration and skill practice provided.      Pulmonary Rehab from 04/04/2016 in [facility] Cardiac and Pulmonary Rehab   Date  02/03/16   Educator  RM   Instruction Review Code  2- meets goals/outcomes      Stress Management: - Provides group verbal and written instruction about the health risks of elevated stress, cause of high stress, and healthy ways to reduce stress.      Pulmonary Rehab from 04/04/2016 in [facility] Cardiac and Pulmonary Rehab   Date  02/10/16   Educator  [facility]   Instruction Review Code  2- meets goals/outcomes      Depression: - Provides group verbal and written instruction  on the correlation between heart/lung disease and depressed mood, treatment options, and the stigmas associated with seeking treatment.      Pulmonary Rehab from 04/04/2016 in [facility] Cardiac and Pulmonary Rehab   Date  03/23/16   Educator  [facility]   Instruction Review Code  2- meets goals/outcomes      Exercise & Equipment Safety: - Individual verbal instruction and demonstration of equipment use and safety with use of the equipment.      Pulmonary Rehab from 04/04/2016 in [facility] Cardiac and Pulmonary Rehab   Date  01/27/16   Educator  Sb   Instruction Review Code  2- meets goals/outcomes      Infection Prevention: - Provides verbal and written material to individual with discussion of infection control including proper hand washing and proper equipment cleaning during exercise session.      Pulmonary Rehab from 04/04/2016 in [facility] [facility]   Date  01/27/16   Educator  SB   Instruction Review Code  2- meets goals/outcomes      Falls Prevention: - Provides verbal and written material to individual with discussion of falls prevention and safety.  Pulmonary Rehab from 04/04/2016 in [facility] Cardiac and Pulmonary Rehab   Date  01/27/16   Educator  SB   Instruction Review Code  2- meets goals/outcomes      Diabetes: - Individual verbal and written instruction to review signs/symptoms of diabetes, desired ranges of glucose level fasting, after meals and with exercise. Advice that pre and post exercise glucose checks will be done for 3 sessions at entry of program.   Chronic Lung Diseases: - Group verbal and written instruction to review new updates, new respiratory medications, new advancements in procedures and treatments. Provide informative websites and "800" numbers of self-education.      Pulmonary Rehab from 04/04/2016 in [facility] Cardiac and Pulmonary Rehab   Date  02/01/16   Educator  L. Owens Shark, RT   Instruction Review Code  2- meets goals/outcomes      Lung  Procedures: - Group verbal and written instruction to describe testing methods done to diagnose lung disease. Review the outcome of test results. Describe the treatment choices: Pulmonary Function Tests, ABGs and oximetry.   Energy Conservation: - Provide group verbal and written instruction for methods to conserve energy, plan and organize activities. Instruct on pacing techniques, use of adaptive equipment and posture/positioning to relieve shortness of breath.   Triggers: - Group verbal and written instruction to review types of environmental controls: home humidity, furnaces, filters, dust mite/pet prevention, HEPA vacuums. To discuss weather changes, air quality and the benefits of nasal washing.   Exacerbations: - Group verbal and written instruction to provide: warning signs, infection symptoms, calling MD promptly, preventive modes, and value of vaccinations. Review: effective airway clearance, coughing and/or vibration techniques. Create an Sports administrator.      Pulmonary Rehab from 04/04/2016 in [facility] Cardiac and Pulmonary Rehab   Date  02/29/16   Educator  LB   Instruction Review Code  2- meets goals/outcomes      Oxygen: - Individual and group verbal and written instruction on oxygen therapy. Includes supplement oxygen, available portable oxygen systems, continuous and intermittent flow rates, oxygen safety, concentrators, and Medicare reimbursement for oxygen.   Respiratory Medications: - Group verbal and written instruction to review medications for lung disease. Drug class, frequency, complications, importance of spacers, rinsing mouth after steroid MDI's, and proper cleaning methods for nebulizers.   AED/CPR: - Group verbal and written instruction with the use of models to demonstrate the basic use of the AED with the basic ABC's of resuscitation.      Pulmonary Rehab from 04/04/2016 in [facility] Cardiac and Pulmonary Rehab   Date  02/19/16   Educator  CE   Instruction Review  Code  2- meets goals/outcomes      Breathing Retraining: - Provides individuals verbal and written instruction on purpose, frequency, and proper technique of diaphragmatic breathing and pursed-lipped breathing. Applies individual practice skills.      Pulmonary Rehab from 04/04/2016 in [facility] Cardiac and Pulmonary Rehab   Date  01/27/16   Educator  sB   Instruction Review Code  2- meets goals/outcomes      Anatomy and Physiology of the Lungs: - Group verbal and written instruction with the use of models to provide basic lung anatomy and physiology related to function, structure and complications of lung disease.      Pulmonary Rehab from 04/04/2016 in [facility] Cardiac and Pulmonary Rehab   Date  03/11/16   Educator  SJ RRT   Instruction Review Code  2- meets goals/outcomes      Heart  Failure: - Group verbal and written instruction on the basics of heart failure: signs/symptoms, treatments, explanation of ejection fraction, enlarged heart and cardiomyopathy.   Sleep Apnea: - Individual verbal and written instruction to review Obstructive Sleep Apnea. Review of risk factors, methods for diagnosing and types of masks and machines for OSA.   Anxiety: - Provides group, verbal and written instruction on the correlation between heart/lung disease and anxiety, treatment options, and management of anxiety.   Relaxation: - Provides group, verbal and written instruction about the benefits of relaxation for patients with heart/lung disease. Also provides patients with examples of relaxation techniques.      Pulmonary Rehab from 04/04/2016 in [facility] Cardiac and Pulmonary Rehab   Date  02/24/16   Educator  [facility]   Instruction Review Code  2- Meets goals/outcomes      Knowledge Questionnaire Score:     Knowledge Questionnaire Score - 01/27/16 1403    Knowledge Questionnaire Score   Pre Score 6/10       Core Components/Risk Factors/Patient Goals at Admission:     Personal Goals and Risk  Factors at Admission - 01/27/16 1422    Core Components/Risk Factors/Patient Goals on Admission   Sedentary Yes  Ariston utilizes the Treadmill and weight machine at his residence. He tries 3-5 days a week if he is feeling well. His goal is to stay active.  He has recently had concerns with "dizziness" and he was advised to talk to his PMD about the symptoms.    Intervention (read-only) While in program, learn and follow the exercise prescription taught. Start at a low level workload and increase workload after able to maintain previous level for 30 minutes. Increase time before increasing intensity.   Improve shortness of breath with ADL's Yes  Matej scored some fear with SOB, he stated he has SOB with exertion, that has learned to space his activities to be less SOB   Intervention (read-only) While in program, learn and follow the exercise prescription taught. Start at a low level workload and increase workload ad advised by the exercise physiologist. Increase time before increasing intensity.   Develop more efficient breathing techniques such as purse lipped breathing and diaphragmatic breathing; and practicing self-pacing with activity Yes  Amarian instructed on purse lipped breathing and he had great return demonstration.   Intervention (read-only) While in program, learn and utilize the specific breathing techniques taught to you. Continue to practice and use the techniques as needed.   Increase knowledge of respiratory medications and ability to use respiratory devices properly  Yes  Andriy has ventolin as needed, new nebuliser to use. Symbicort inhaler also.  Adalberto did not have a spacer, will provide him with one and instruction for proper use next visit.   Intervention (read-only) While in program learn and demonstrate appropriate use of your oxygen therapy by increasing flow with exertion, manage oxygen tank operation, including continuous and intermittent flow.  Understanding oxygen is a drug ordered  by your physician.   Hypertension Yes   Goal Participant will see blood pressure controlled within the values of 140/80m/Hg or within value directed by their physician.   Intervention (read-only) Provide nutrition & aerobic exercise along with prescribed medications to achieve BP 140/90 or less.   Lipids Yes   Goal Cholesterol controlled with medications as prescribed, with individualized exercise RX and with personalized nutrition plan. Value goals: LDL < 781m HDL > 4031mParticipant states understanding of desired cholesterol values and following prescriptions.   Intervention (read-only) Provide  nutrition & aerobic exercise along with prescribed medications to achieve LDL <67m, HDL >438m   Personal Goal Other Yes   Personal Goal Stay Active   Intervention Follow exercise prescription guidelines to gradually increase activity levels without increase in SOB.    Understand more about Heart/Pulmonary Disease. Yes  Kenta came to the program with a diagnosis of Asthma and Sleep apnea.  JaKhaleoiced interest in learning more about the disease process and what he can do to keep it controlled.      Intervention While in program utilize professionals for any questions, and attend the education sessions. Great websites to use are www.americanheart.org or www.lung.org for reliable information.  Algis has stoppped using the CPAP,because he tried every method available to use the machine and he did not tolerate any method.      Core Components/Risk Factors/Patient Goals Review:      Goals and Risk Factor Review      02/03/16 1000 02/22/16 1000 02/29/16 1000 03/23/16 1624     Core Components/Risk Factors/Patient Goals Review   Personal Goals Review Develop more efficient breathing techniques such as purse lipped breathing and diaphragmatic breathing and practicing self-pacing with activity. Sedentary;Improve shortness of breath with ADL's;Increase knowledge of respiratory medications and ability to use  respiratory devices properly.;Develop more efficient breathing techniques such as purse lipped breathing and diaphragmatic breathing and practicing self-pacing with activity. Increase knowledge of respiratory medications and ability to use respiratory devices properly. Improve shortness of breath with ADL's;Increase knowledge of respiratory medications and ability to use respiratory devices properly.;Develop more efficient breathing techniques such as purse lipped breathing and diaphragmatic breathing and practicing self-pacing with activity.    Review  Mr FaEastridgetates he has more energy, although he still has increased shortness of breath; he is using PLB and does find that technique helpful; Gave Mr FaShorb aerochamber for his Ventolin and Symbicort Mr FiCaryn Sections experiencing horseness. We discussed that his Symbicort may be causing this, and at his appointment with Dr FlRaul Delomorrow, I suggested he mention this to Dr FlRaul Del  He could possibly order another inhaler  which would not cause this side effect. Mr FaPaveyontinues to use his MDI's as prescribed and his aerochamber with his albuterol and Symbicort; He manages his shortness of breath with PLB and [facility]    Expected Outcomes  Continue to improve energy and stamina with his exercise goals; follow-up on aerochamber use.  Continue progressing with his exercise goals and learning management of his lung condition.    Breathing Techniques (read-only)   Goals Progress/Improvement seen  Yes       Comments Reviewed PLB with Mr FaLindrothnd encouraged him to use this technique with his exercise goals.          Core Components/Risk Factors/Patient Goals at Discharge (Final Review):      Goals and Risk Factor Review - 03/23/16 1624    Core Components/Risk Factors/Patient Goals Review   Personal Goals Review Improve shortness of breath with ADL's;Increase knowledge of respiratory medications and  ability to use respiratory devices properly.;Develop more efficient breathing techniques such as purse lipped breathing and diaphragmatic breathing and practicing self-pacing with activity.   Review Mr FaAldreteontinues to use his MDI's as prescribed and his aerochamber with his albuterol and Symbicort; He manages his shortness of breath with PLB and [facility]   Expected Outcomes Continue  progressing with his exercise goals and learning management of his lung condition.      ITP Comments:     ITP Comments      01/27/16 1409 01/27/16 1411 02/03/16 1000 02/03/16 1053 02/05/16 1056   ITP Comments Today Barnabas Lister attended his orientation. Program and personal goals were reviewed with Barnabas Lister.  Expectationis that Neiman will meet his goals at the end of the 36 session. Review will be done during his program to set short term goals as needed.  At the end of the 6 minute walk, Srijan experienced an increase in his "dizziness" . This got better after a couple cups of water and sitting, but did not totally resolve. after discussion with jacj, I took him by wheelchair to the [facility] at his primary MD office. Karver will be evaluated and treated at the clinic. BP was fine, Blood sugar check  was 52m/Dl.  Mr FWilkiecompleted his first exercise day in [facility]. He reached his goals and did very well and will progress in the program. JDwaynis expected to meet his goals by end of the program in 35 sessions.  Personal and exercise goals expected to be met in 33 more sessions. Progress on specific individualized goals will be charted in patient's ITP. Upon completion of the program the patient will be comfortable managing exercise goals and progression on their own.      02/08/16 1057 02/10/16 1107 02/12/16 1059 02/15/16 1041 02/24/16 1102   ITP Comments Personal and exercise goals expected to be met in 32 more sessions. Progress on specific individualized goals will be charted  in patient's ITP. Upon completion of the program the patient will be comfortable managing exercise goals and progression on their own.  Personal and exercise goals expected to be met in 31 more sessions. Progress on specific individualized goals will be charted in patient's ITP. Upon completion of the program the patient will be comfortable managing exercise goals and progression on their own.  Personal and exercise goals expected to be met in 30 more sessions. Progress on specific individualized goals will be charted in patient's ITP. Upon completion of the program the patient will be comfortable managing exercise goals and progression on their own.  Personal and exercise goals expected to be met in 29 more sessiosns. See ITP for specific notes no goal progression.  Progress on specific individualized goals will be charted in patient's ITP. Upon completion of the program the patient will be comfortable managing exercise goals and progression on their own.      03/02/16 1452 03/09/16 1000 03/23/16 1000 03/28/16 1034     ITP Comments Mr FZehnerwas started on an antibiotic by Dr FRaul Deland referred to an Ear, Nose, Throat specialist. Mr FWinshipdid not feel well today. He did take 2 puffs on his Ventolin MDI, but still decided to omit exercise for the day. Mr FValleyhas an appointment with Dr BVirgia Landfor his cough on 03/25/2016. JKollinreported that on Friday the doctor ran that light down his throat. He said they will increase his reflux medicine. JEnzoreports he tried the nasal spray they gave him but he "can't take it since it makes him dizzy and he didn't feel well.        Comments: Did 5 minute warm up on Recubment bike but then did 14 minutes on RB. C/o cough and productive yellow green sputum so I sent him to go see his MD or urgent Care

## 2016-04-08 NOTE — Progress Notes (Signed)
Daily Session Note  Patient Details  Name: Wesley Newton MRN: [id number] Date of Birth: November 26, 1927 Referring Provider:    Encounter Date: 04/08/2016  Check In:     Session Check In - 04/08/16 1047    Check-In   Location [facility]-Cardiac & Pulmonary Rehab   Staff Present Gerlene Burdock, RN, BSN;Rebecca Sickles, DPT, CEEA;Heath Lark, RN, BSN, CCRP   Physician(s) Dr. Marcelene Butte and Dr. Kerman Passey   Medication changes reported     No   Fall or balance concerns reported    No   Warm-up and Cool-down Not performed (comment)  Did 5 minute warm up on Recubment bike but then did 14 minutes on RB. C/o cough and productive yellow green sputum so I sent him to go see his MD or urgent Care.    Resistance Training Performed No   VAD Patient? No         Goals Met:  Proper associated with RPD/PD & O2 Sat  Goals Unmet:  Not Applicable  Comments: Did 5 minute warm up on Recubment bike but then did 14 minutes on RB. C/o cough and productive yellow green sputum so I sent him to go see his MD or urgent Care   Dr. Emily Filbert is Medical Director for [place] and [facility].

## 2016-04-11 ENCOUNTER — Encounter: Payer: Medicare Other | Admitting: *Deleted

## 2016-04-11 DIAGNOSIS — J45909 Unspecified asthma, uncomplicated: Secondary | ICD-10-CM

## 2016-04-11 DIAGNOSIS — J45998 Other asthma: Secondary | ICD-10-CM | POA: Diagnosis not present

## 2016-04-11 DIAGNOSIS — G4730 Sleep apnea, unspecified: Secondary | ICD-10-CM

## 2016-04-11 NOTE — Progress Notes (Signed)
Daily Session Note  Patient Details  Name: Wesley Newton MRN: [id number] Date of Birth: [birth year] Referring Provider:    Encounter Date: 04/11/2016  Check In:     Session Check In - 04/11/16 1125    Check-In   Location [facility]-Cardiac & Pulmonary Rehab   Staff Present Heath Lark, RN, BSN, CCRP;Laureen Owens Shark, BS, RRT, Respiratory Bertis Ruddy, BS, ACSM CEP, Exercise Physiologist;Rebecca Brayton El, DPT, CEEA   Supervising physician immediately available to respond to emergencies [facility] immediately available ER MD   Physician(s) Clearnce Hasten and McShane   Medication changes reported     No   Fall or balance concerns reported    No   Warm-up and Cool-down Performed on first and last piece of equipment   Resistance Training Performed Yes   VAD Patient? No   Pain Assessment   Currently in Pain? No/denies   Multiple Pain Sites No         Goals Met:  Proper associated with RPD/PD & O2 Sat Independence with exercise equipment Exercise tolerated well Strength training completed today  Goals Unmet:  Not Applicable  Comments: Patient completed exercise prescription and all exercise goals during rehab session. The exercise was tolerated well and the patient is progressing in the program.     Dr. Emily Filbert is Medical Director for [place] and [facility].

## 2016-04-13 ENCOUNTER — Encounter: Payer: Medicare Other | Admitting: *Deleted

## 2016-04-13 DIAGNOSIS — J45998 Other asthma: Secondary | ICD-10-CM | POA: Diagnosis not present

## 2016-04-13 DIAGNOSIS — G4730 Sleep apnea, unspecified: Secondary | ICD-10-CM

## 2016-04-13 DIAGNOSIS — J45909 Unspecified asthma, uncomplicated: Secondary | ICD-10-CM

## 2016-04-13 NOTE — Progress Notes (Signed)
Daily Session Note  Patient Details  Name: Wesley Newton MRN: [id number] Date of Birth: [birth year] Referring Provider:    Encounter Date: 04/13/2016  Check In:     Session Check In - 04/13/16 1151    Check-In   Staff Present Heath Lark, RN, BSN, CCRP;Laureen Owens Shark, BS, RRT, Respiratory Therapist;Rebecca Brayton El, DPT, Axis physician immediately available to respond to emergencies [facility] immediately available ER MD   Physician(s) Drs: Cinda Quest and Joni Fears   Medication changes reported     No   Fall or balance concerns reported    No   Warm-up and Cool-down Performed on first and last piece of equipment   VAD Patient? No   VAD patient   Has back up controller? No   Pain Assessment   Currently in Pain? No/denies         Goals Met:  Independence with exercise equipment Personal goals reviewed Strength training completed today  Goals Unmet:  Not Applicable  Comments: Doing well with exercise prescription progression. See Exercise Comments   Dr. Emily Filbert is Medical Director for [place] and [facility].

## 2016-04-15 ENCOUNTER — Encounter: Payer: Medicare Other | Admitting: Respiratory Therapy

## 2016-04-15 DIAGNOSIS — G4730 Sleep apnea, unspecified: Secondary | ICD-10-CM

## 2016-04-15 DIAGNOSIS — J45909 Unspecified asthma, uncomplicated: Secondary | ICD-10-CM

## 2016-04-15 DIAGNOSIS — J45998 Other asthma: Secondary | ICD-10-CM | POA: Diagnosis not present

## 2016-04-15 NOTE — Progress Notes (Signed)
Daily Session Note  Patient Details  Name: Wesley Newton MRN: [id number] Date of Birth: 04/05/1927 Referring Provider:    Encounter Date: 04/15/2016  Check In:     Session Check In - 04/15/16 1115    Check-In   Staff Present Nyoka Cowden, RN;Susanne Bice, RN, BSN, CCRP;Unique Searfoss Blanch Media, RRT, RCP, Respiratory Therapist   Supervising physician immediately available to respond to emergencies [facility] immediately available ER MD   Physician(s) Dr. Edd Fabian and Dr. Jimmye Norman   Medication changes reported     No   Fall or balance concerns reported    No   Warm-up and Cool-down Performed on first and last piece of equipment   Resistance Training Performed Yes   VAD Patient? No   Pain Assessment   Currently in Pain? No/denies         Goals Met:  Proper associated with RPD/PD & O2 Sat Independence with exercise equipment Exercise tolerated well Strength training completed today  Goals Unmet:  Not Applicable  Comments:    Dr. Emily Filbert is Medical Director for [place] and [facility].

## 2016-04-18 ENCOUNTER — Encounter: Payer: Medicare Other | Attending: Specialist | Admitting: *Deleted

## 2016-04-18 VITALS — Ht 66.0 in | Wt 177.1 lb

## 2016-04-18 DIAGNOSIS — J45909 Unspecified asthma, uncomplicated: Secondary | ICD-10-CM

## 2016-04-18 DIAGNOSIS — Z87891 Personal history of nicotine dependence: Secondary | ICD-10-CM | POA: Insufficient documentation

## 2016-04-18 DIAGNOSIS — N400 Enlarged prostate without lower urinary tract symptoms: Secondary | ICD-10-CM | POA: Insufficient documentation

## 2016-04-18 DIAGNOSIS — G4730 Sleep apnea, unspecified: Secondary | ICD-10-CM

## 2016-04-18 DIAGNOSIS — J449 Chronic obstructive pulmonary disease, unspecified: Secondary | ICD-10-CM | POA: Diagnosis not present

## 2016-04-18 DIAGNOSIS — J45998 Other asthma: Secondary | ICD-10-CM | POA: Diagnosis present

## 2016-04-18 NOTE — Progress Notes (Signed)
Daily Session Note  Patient Details  Name: Wesley Newton MRN: [id number] Date of Birth: Jun 12, 1927 Referring Provider:    Encounter Date: 04/18/2016  Check In:     Session Check In - 04/18/16 1204    Check-In   Location [facility]-Cardiac & Pulmonary Rehab   Staff Present Gerlene Burdock, RN, BSN;Laureen Owens Shark, BS, RRT, Respiratory Therapist;Alejandrina Raimer Amedeo Plenty, BS, ACSM CEP, Exercise Physiologist   Supervising physician immediately available to respond to emergencies [facility] immediately available ER MD   Physician(s) Joni Fears and Burlene Arnt   Medication changes reported     No   Fall or balance concerns reported    No   Warm-up and Cool-down Performed on first and last piece of equipment   Resistance Training Performed Yes   VAD Patient? No           Exercise Prescription Changes - 04/18/16 1200    Exercise Review   Progression Yes   Response to Exercise   Comments Exericse increases were discussed with patient and he tolerated them with no signs or symptoms.    Intensity Rest + 30   Progression   Progression Continue progressive overload as per policy without signs/symptoms or physical distress.   Resistance Training   Training Prescription Yes   Weight 3   Reps 10-12   Treadmill   MPH 2.7   Grade 0   Minutes 15   Recumbant Bike   Level 6   RPM 70   Watts 15   NuStep   Level 6   Watts 60   Minutes 15      Goals Met:  Proper associated with RPD/PD & O2 Sat Independence with exercise equipment Exercise tolerated well Personal goals reviewed Strength training completed today  Goals Unmet:  Not Applicable  Comments: Reviewed individualized exercise prescription and made increases per departmental policy. Exercise increases were discussed with the patient and they were able to perform the new work loads without issue (no signs or symptoms).     Dr. Emily Filbert is Medical Director for [place] and [facility].

## 2016-04-20 ENCOUNTER — Encounter: Payer: Medicare Other | Admitting: *Deleted

## 2016-04-20 DIAGNOSIS — G4730 Sleep apnea, unspecified: Secondary | ICD-10-CM

## 2016-04-20 DIAGNOSIS — J45909 Unspecified asthma, uncomplicated: Secondary | ICD-10-CM

## 2016-04-20 DIAGNOSIS — J45998 Other asthma: Secondary | ICD-10-CM | POA: Diagnosis not present

## 2016-04-20 NOTE — Progress Notes (Signed)
Daily Session Note  Patient Details  Name: Wesley Newton MRN: [id number] Date of Birth: 1927-04-16 Referring Provider:    Encounter Date: 04/20/2016  Check In:     Session Check In - 04/20/16 1234    Check-In   Staff Present Heath Lark, RN, BSN, CCRP;Laureen Owens Shark, BS, RRT, Respiratory Orlie Dakin, RN, BSN   Supervising physician immediately available to respond to emergencies [facility] immediately available ER MD   Physician(s) Drs: Jimmye Norman and Reita Cliche   Medication changes reported     No   Fall or balance concerns reported    No   Warm-up and Cool-down Performed on first and last piece of equipment   VAD Patient? No   VAD patient   Has back up controller? No         Goals Met:  Independence with exercise equipment Exercise tolerated well Strength training completed today  Goals Unmet:  Not Applicable  Comments: Doing well with exercise prescription progression.    Dr. Emily Filbert is Medical Director for [place] and [facility].

## 2016-04-25 ENCOUNTER — Encounter: Payer: Medicare Other | Admitting: *Deleted

## 2016-04-25 DIAGNOSIS — J45909 Unspecified asthma, uncomplicated: Secondary | ICD-10-CM

## 2016-04-25 DIAGNOSIS — J45998 Other asthma: Secondary | ICD-10-CM | POA: Diagnosis not present

## 2016-04-25 DIAGNOSIS — G4730 Sleep apnea, unspecified: Secondary | ICD-10-CM

## 2016-04-25 NOTE — Progress Notes (Signed)
Daily Session Note  Patient Details  Name: Wesley Newton MRN: [id number] Date of Birth: 10/25/1927 Referring Provider:    Encounter Date: 04/25/2016  Check In:     Session Check In - 04/25/16 1345    Check-In   Location [facility]-Cardiac & Pulmonary Rehab   Staff Present Kelly Hayes, BS, ACSM CEP, Exercise Physiologist;Laureen Brown, BS, RRT, Respiratory Therapist;Carroll Enterkin, RN, BSN;Other   Supervising physician immediately available to respond to emergencies [facility] immediately available ER MD   Physician(s) Lord and Schaevitz   Medication changes reported     No   Fall or balance concerns reported    No   Warm-up and Cool-down Performed on first and last piece of equipment   Resistance Training Performed Yes   VAD Patient? No   VAD patient   Has back up controller? No   Pain Assessment   Currently in Pain? No/denies   Multiple Pain Sites No         Goals Met:  Proper associated with RPD/PD & O2 Sat Independence with exercise equipment Exercise tolerated well Strength training completed today  Goals Unmet:  Not Applicable  Comments: Patient completed exercise prescription and all exercise goals during rehab session. The exercise was tolerated well and the patient is progressing in the program.    Dr. Mark Miller is Medical Director for [facility] and [facility]. 

## 2016-04-26 ENCOUNTER — Other Ambulatory Visit: Payer: Self-pay

## 2016-04-26 DIAGNOSIS — N400 Enlarged prostate without lower urinary tract symptoms: Secondary | ICD-10-CM

## 2016-04-26 MED ORDER — FINASTERIDE 5 MG PO TABS: 5.0000 mg | ORAL_TABLET | Freq: Every day | ORAL | Status: DC

## 2016-04-27 ENCOUNTER — Encounter: Payer: Medicare Other | Admitting: *Deleted

## 2016-04-27 DIAGNOSIS — G4730 Sleep apnea, unspecified: Secondary | ICD-10-CM

## 2016-04-27 DIAGNOSIS — J45909 Unspecified asthma, uncomplicated: Secondary | ICD-10-CM

## 2016-04-27 NOTE — Progress Notes (Signed)
Incomplete Session Note  Patient Details  Name: Wesley Newton MRN: [id number] Date of Birth: July 11, 1927 Referring Provider:    [name] did not complete his rehab session.  Wesley Newton presented with increased cough symptoms and "not feeling well".  He has recently completed a round of antibiotics for a cough and congestion.  Called his PMD and scheduled a 2 PM appointment with his PMD today.  [name] went home to rest and then go to see his PMD at 2.

## 2016-05-02 DIAGNOSIS — J45998 Other asthma: Secondary | ICD-10-CM | POA: Diagnosis not present

## 2016-05-02 DIAGNOSIS — J45909 Unspecified asthma, uncomplicated: Secondary | ICD-10-CM

## 2016-05-02 DIAGNOSIS — G4730 Sleep apnea, unspecified: Secondary | ICD-10-CM

## 2016-05-02 NOTE — Addendum Note (Signed)
Addended by: Nada Maclachlan R on: 05/02/2016 01:26 PM   Modules accepted: Orders

## 2016-05-02 NOTE — Progress Notes (Addendum)
Pulmonary Individual Treatment Plan  Patient Details  Name: Wesley Newton MRN: [id number] Date of Birth: 28-Nov-1927 Referring Provider:   Erby Pian, MD   Initial Encounter Date:       Pulmonary Rehab from 01/27/2016 in [facility]   Date  01/27/16      Visit Diagnosis: Asthma, chronic, unspecified asthma severity, uncomplicated  Sleep apnea  Patient's Home Medications on Admission:  Current outpatient prescriptions:  .  albuterol (PROVENTIL HFA;VENTOLIN HFA) 108 (90 Base) MCG/ACT inhaler, Inhale 2 puffs into the lungs every 6 (six) hours as needed for wheezing or shortness of breath., Disp: , Rfl:  .  albuterol (PROVENTIL) (2.5 MG/3ML) 0.083% nebulizer solution, Take 2.5 mg by nebulization every 6 (six) hours as needed for wheezing or shortness of breath., Disp: , Rfl:  .  aspirin EC 81 MG tablet, Take 81 mg by mouth daily. , Disp: , Rfl:  .  budesonide-formoterol (SYMBICORT) 160-4.5 MCG/ACT inhaler, Inhale 2 puffs into the lungs 2 (two) times daily., Disp: , Rfl:  .  finasteride (PROSCAR) 5 MG tablet, Take 1 tablet (5 mg total) by mouth daily., Disp: 30 tablet, Rfl: 3 .  fluticasone (FLONASE) 50 MCG/ACT nasal spray, Place into the nose., Disp: , Rfl:  .  ipratropium-albuterol (DUONEB) 0.5-2.5 (3) MG/3ML SOLN, Inhale into the lungs., Disp: , Rfl:  .  loratadine (ALLERGY) 10 MG dissolvable tablet, Take by mouth., Disp: , Rfl:  .  Multiple Vitamins-Minerals (PRESERVISION AREDS 2 PO), Take 1 capsule by mouth 2 (two) times daily., Disp: , Rfl:  .  naproxen (NAPROSYN) 375 MG tablet, Take 375 mg by mouth 3 (three) times daily as needed for mild pain. , Disp: , Rfl: 2 .  omeprazole (PRILOSEC) 40 MG capsule, Take 40 mg by mouth daily. , Disp: , Rfl: 4 .  simvastatin (ZOCOR) 40 MG tablet, Take 40 mg by mouth at bedtime. , Disp: , Rfl: 0 .  tamsulosin (FLOMAX) 0.4 MG CAPS capsule, Take 1 capsule (0.4 mg total) by mouth daily., Disp: 30 capsule, Rfl: 3 .  vitamin  B-12 (CYANOCOBALAMIN) 1000 MCG tablet, Take 1,000 mcg by mouth daily., Disp: , Rfl:   Past Medical History: Past Medical History  Diagnosis Date  . Cancer ([place])   . DVT of leg (deep venous thrombosis) ([place])   . Emphysema lung ([place])   . Elevated PSA   . BPH (benign prostatic hyperplasia)   . COPD (chronic obstructive pulmonary disease) ([place])   . Nocturia   . Pulmonary fibrosis ([facility])     Tobacco Use: History  Smoking status  . Former Smoker  Smokeless tobacco  . Not on file    Comment: quit 40 years    Labs: Recent Review Flowsheet Data    There is no flowsheet data to display.       ADL [facility]SD:     Pulmonary Assessment Scores      01/27/16 1136 01/27/16 1406     ADL UCSD   ADL Phase Entry     SOB Score total  60    Rest  0    Walk  1    Stairs  3    Bath  4    Dress  4    Shop  2       Pulmonary Function Assessment:     Pulmonary Function Assessment - 01/27/16 1300    Initial Spirometry Results   FVC% 106 %   FEV1% 134 %   FEV1/FVC  Ratio 95.65   Breath   Bilateral Breath Sounds Rales;Basilar;Inspiratory   Shortness of Breath Yes;Limiting activity      Exercise Target Goals:    Exercise Program Goal: Individual exercise prescription set with THRR, safety & activity barriers. Participant demonstrates ability to understand and report RPE using BORG scale, to self-measure pulse accurately, and to acknowledge the importance of the exercise prescription.  Exercise Prescription Goal: Starting with aerobic activity 30 plus minutes a day, 3 days per week for initial exercise prescription. Provide home exercise prescription and guidelines that participant acknowledges understanding prior to discharge.  Activity Barriers & Risk Stratification:     Activity Barriers & Cardiac Risk Stratification - 01/27/16 1205    Activity Barriers & Cardiac Risk Stratification   Activity Barriers Balance Concerns;Shortness of Breath;Deconditioning      6 Minute  Walk:     6 Minute Walk      01/27/16 1331 03/14/16 1109 04/18/16 1205   6 Minute Walk   Phase Initial Mid Program Discharge   Distance 1245 feet 1300 feet 1390 feet   Distance % Change  4 %    Walk Time 6 minutes 6 minutes 6 minutes   # of Rest Breaks 0  0   MPH 2.4     RPE '12 13 11   ' Perceived Dyspnea  '2 3 2   ' Symptoms Yes (comment) No No   Comments Light headed after walking, blurred vision, short of breath  no symtpoms reported. Patient stated that he felt pretty good during the walk    Resting HR 90 bpm 70 bpm 76 bpm   Resting BP 128/68 mmHg 124/72 mmHg 100/58 mmHg   Max Ex. HR 116 bpm 94 bpm 102 bpm   Max Ex. BP 150/60 mmHg 148/74 mmHg 142/82 mmHg      Initial Exercise Prescription:     Initial Exercise Prescription - 01/27/16 1300    Date of Initial Exercise RX and Referring Provider   Date 01/27/16   Treadmill   MPH 2   Grade 0   Minutes 10  Use intervals if necessary such as 2, 5 min intervals   Bike   Level 0.4   Watts 12   Minutes 10   Recumbant Bike   Level 3   RPM 40   Watts 25   Minutes 10   NuStep   Level 2   Watts 25   Minutes 10   Arm Ergometer   Level 1   Watts 8   Minutes 10   Arm/Foot Ergometer   Level 4   Watts 12   Minutes 10   Cybex   Level 2   RPM 50   Minutes 10   Recumbant Elliptical   Level 1   RPM 35   Watts 10   Minutes 10   REL-XR   Level 2   Watts 35   Minutes 10   T5 Nustep   Level 1   Watts 10   Minutes 10   Biostep-RELP   Level 2   Watts 15   Minutes 10   Prescription Details   Frequency (times per week) 3   Duration Progress to 30 minutes of continuous aerobic without signs/symptoms of physical distress   Intensity   THRR REST +  30   Ratings of Perceived Exertion 11-13   Perceived Dyspnea 2-4   Progression   Progression Continue progressive overload as per policy without signs/symptoms or physical distress.   Resistance Training  Training Prescription Yes   Weight 2   Reps 10-12       Perform Capillary Blood Glucose checks as needed.  Exercise Prescription Changes:     Exercise Prescription Changes      01/27/16 1100 02/03/16 1200 02/05/16 1000 02/08/16 1000 02/10/16 1100   Exercise Review   Progression   Yes Yes Yes   Response to Exercise   Blood Pressure (Admit) 128/68 mmHg 122/70 mmHg      Blood Pressure (Exercise) 150/60 mmHg 136/60 mmHg      Blood Pressure (Exit) 128/62 mmHg 124/76 mmHg      Heart Rate (Admit) 90 bpm 92 bpm      Heart Rate (Exercise) 116 bpm 111 bpm      Heart Rate (Exit) 96 bpm 104 bpm      Oxygen Saturation (Admit) 96 % 95 %      Oxygen Saturation (Exercise) 90 % 92 %      Oxygen Saturation (Exit) 96 % 97 %      Rating of Perceived Exertion (Exercise)  11      Perceived Dyspnea (Exercise)  3      Symptoms  Heavy cough, he says it is chronic None None None   Comments  First day of exercise! Patient was oriented to the gym and the equipment functions and settings. Procedures and policies of the gym were outlined and explained. The patient's individual exercise prescription and treatment plan were reviewed with them. All starting workloads were established based on the results of the functional testing  done at the initial intake visit. The plan for exercise progression was also introduced and progression will be customized based on the patient's performance and goals Reviewed individualized exercise prescription and made increases per departmental policy. Exercise increases were discussed with the patient and they were able to perform the new work loads without issue (no signs or symptoms).  Gloria's stamina is increasing and this is reflected in longer exercise times. He is not quite exercising for the full class time. Izzac continues to increase in stamina and has progressed on the TM which is his most difficult machine. He reports feeling more confident in his walking and has noticed the improvement    Duration  Progress to 30 minutes of  continuous aerobic without signs/symptoms of physical distress Progress to 30 minutes of continuous aerobic without signs/symptoms of physical distress Progress to 30 minutes of continuous aerobic without signs/symptoms of physical distress Progress to 30 minutes of continuous aerobic without signs/symptoms of physical distress   Intensity  Rest + 30 Rest + 30 Rest + 30 Rest + 30   Progression   Progression  Continue progressive overload as per policy without signs/symptoms or physical distress. Continue progressive overload as per policy without signs/symptoms or physical distress. Continue progressive overload as per policy without signs/symptoms or physical distress. Continue progressive overload as per policy without signs/symptoms or physical distress.   Resistance Training   Training Prescription (read-only)  Yes Yes Yes Yes   Weight (read-only)  '2 2 2 2   ' Reps (read-only)  10-12 10-12 10-12 10-12   Treadmill   MPH (read-only)  '2 2 2 2   ' Grade (read-only)  0 0 0 0   Minutes (read-only)  10  5 min intervals x '2 10  5 ' min intervals x '2 10  5 ' min intervals x 2 12   Recumbant Bike   Level (read-only)  '3 3 3 3   ' RPM (read-only)  60  60 60 60   Minutes (read-only)  '10 12 15 15   ' NuStep   Level (read-only)  '2 4 4 4   ' Watts (read-only)  '25 25 25 25   ' Minutes (read-only)  '10 10 12 12     ' 02/12/16 1100 02/15/16 1000 02/19/16 1100 02/22/16 1100 03/07/16 1100   Exercise Review   Progression Yes Yes Yes Yes Yes   Response to Exercise   Blood Pressure (Admit)    132/68 mmHg    Blood Pressure (Exercise)    126/74 mmHg    Blood Pressure (Exit)    124/62 mmHg    Heart Rate (Admit)    82 bpm    Heart Rate (Exercise)    96 bpm    Heart Rate (Exit)    105 bpm    Oxygen Saturation (Admit)    96 %    Oxygen Saturation (Exercise)    95 %    Oxygen Saturation (Exit)    98 %    Rating of Perceived Exertion (Exercise)    13    Perceived Dyspnea (Exercise)    3    Symptoms None None None None None    Comments Jaxsun continues to increase in stamina and has progressed on the TM which is his most difficult machine. He reports feeling more confident in his walking and has noticed the improvement  Bosco continues to increase in stamina and has progressed on the TM which is his most difficult machine. Cirilo increased his time on the TM. These changes were reviewed with him and he tolerated this increase with no signs or symptoms.  Darrio continues to increase in stamina and has progressed on the TM which is his most difficult machine. Anzel increased his time on the TM. These changes were reviewed with him and he tolerated this increase with no signs or symptoms.  Increases made to the patient's exercise prescription were reviewed with the patient. He tolerated these changes with no signs or sypmtoms.  Increases made to the patient's exercise prescription were reviewed with the patient. He tolerated these changes with no signs or sypmtoms.    Duration Progress to 30 minutes of continuous aerobic without signs/symptoms of physical distress Progress to 30 minutes of continuous aerobic without signs/symptoms of physical distress Progress to 30 minutes of continuous aerobic without signs/symptoms of physical distress Progress to 30 minutes of continuous aerobic without signs/symptoms of physical distress Progress to 30 minutes of continuous aerobic without signs/symptoms of physical distress   Intensity Rest + 30 Rest + 30 Rest + 30 Rest + 30 Rest + 30   Progression   Progression Continue progressive overload as per policy without signs/symptoms or physical distress. Continue progressive overload as per policy without signs/symptoms or physical distress. Continue progressive overload as per policy without signs/symptoms or physical distress. Continue progressive overload as per policy without signs/symptoms or physical distress. Continue progressive overload as per policy without signs/symptoms or physical distress.    Resistance Training   Training Prescription (read-only) Yes Yes      Weight (read-only) 2 3      Reps (read-only) 10-12 10-12      Treadmill   MPH   2.2 2.2 2.2   Grade    0 0   Minutes    15 15   MPH (read-only) 2 2      Grade (read-only) 0 0      Minutes (read-only) 12 15      [place]  Level    4 5   RPM    60 65   Watts    15 15   Level (read-only) 3 3      RPM (read-only) 60 60      Minutes (read-only) 15 15      NuStep   Level    5 5   Watts    50 50   Minutes    15 15   Level (read-only) 4 4      Watts (read-only) 25 25      Minutes (read-only) 15 15        03/14/16 1000 03/18/16 1000 03/25/16 1100 04/04/16 1200 04/06/16 1100   Exercise Review   Progression Yes Yes Yes     Response to Exercise   Blood Pressure (Admit)    110/60 mmHg    Blood Pressure (Exercise)    130/84 mmHg    Blood Pressure (Exit)    122/50 mmHg    Heart Rate (Admit)    83 bpm    Heart Rate (Exercise)    107 bpm    Heart Rate (Exit)    87 bpm    Oxygen Saturation (Admit)    95 %    Oxygen Saturation (Exercise)    92 %    Oxygen Saturation (Exit)    96 %    Rating of Perceived Exertion (Exercise)    15    Perceived Dyspnea (Exercise)    4    Symptoms None None None None    Comments TM speed has been increased to support Ettore's strength and stamina gains and to continue to progress his aerobic capacity via walking. Making steady increases on the TM.  Making steady increases on the TM.  Exericse increases were discussed with patient and he tolerated them with no signs or symptoms.  Exericse increases were discussed with patient and he tolerated them with no signs or symptoms.    Duration Progress to 30 minutes of continuous aerobic without signs/symptoms of physical distress Progress to 30 minutes of continuous aerobic without signs/symptoms of physical distress Progress to 30 minutes of continuous aerobic without signs/symptoms of physical distress Progress to 45 minutes of aerobic exercise  without signs/symptoms of physical distress    Intensity Rest + 30 Rest + 30 Rest + 30 Rest + 30 Rest + 30   Progression   Progression Continue progressive overload as per policy without signs/symptoms or physical distress. Continue progressive overload as per policy without signs/symptoms or physical distress. Continue progressive overload as per policy without signs/symptoms or physical distress. Continue progressive overload as per policy without signs/symptoms or physical distress. Continue progressive overload as per policy without signs/symptoms or physical distress.   Resistance Training   Training Prescription Yes Yes Yes Yes Yes   Weight '3 3 3 3 3   ' Reps 10-12 10-12 10-12 10-12 10-12   Treadmill   MPH 2.4 2.5 2.5 2.5 2.7   Grade 0 0 0 0 0   Minutes '15 15 15 15 15   ' Recumbant Bike   Level '5 5 5 6 6   ' RPM 65 65 65 63 63   Watts '15 15 15 15 15   ' NuStep   Level '5 5 5 5 6   ' Watts 50 50 75 75 60   Minutes '15 15 15 15 15     ' 04/18/16 1200 04/18/16 1500         Exercise Review   Progression Yes Yes  Response to Exercise   Blood Pressure (Admit)  100/58 mmHg      Blood Pressure (Exercise)  142/82 mmHg      Blood Pressure (Exit)  112/64 mmHg      Heart Rate (Admit)  58 bpm      Heart Rate (Exercise)  105 bpm      Heart Rate (Exit)  94 bpm      Oxygen Saturation (Admit)  98 %      Oxygen Saturation (Exercise)  97 %      Oxygen Saturation (Exit)  98 %      Rating of Perceived Exertion (Exercise)  13      Perceived Dyspnea (Exercise)  2      Symptoms  None      Comments Exericse increases were discussed with patient and he tolerated them with no signs or symptoms.        Intensity Rest + 30 Rest + 30      Progression   Progression Continue progressive overload as per policy without signs/symptoms or physical distress. Continue to progress workloads to maintain intensity without signs/symptoms of physical distress.      Resistance Training   Training Prescription Yes Yes       Weight 3 4      Reps 10-12 10-12      Treadmill   MPH 2.7 2.7      Grade 0 0      Minutes 15 15      Recumbant Bike   Level 6 6      RPM 70 70      Watts 15 15      NuStep   Level 6 5      Watts 60 75      Minutes 15 15         Exercise Comments:     Exercise Comments      03/07/16 1104 03/14/16 1113 03/21/16 1517 04/08/16 1106 04/13/16 1148   Exercise Comments Reviewed individualized exercise prescription and made increases per departmental policy. Exercise increases were discussed with the patient and they were able to perform the new work loads without issue (no signs or symptoms).  Kasten did his mid-evaluation 6 min walk today. He tolerated this assessment well and improved 4% in his walk distance as compared to his initial assessment. Vital signs were all within acceptable ranges during this assessment. See 6 min walk data for detailed report.  Mr Grosso has been exercising at [facility] gym 1 or 2 days/week during [facility]. He uses the TM, NS, and BX for at least 30 mins. He enjoys being active and appreciate the access to a gym at his retirement community. Did 5 minute warm up on Recubment bike but then did 14 minutes on RB. C/o cough and productive yellow green sputum so I sent him to go see his MD or urgent Care Doyle stated today that his legs are stronger and he has more endurance since he started this Pulmonary Rehab program.      04/18/16 1209           Exercise Comments Administered 6 min walk test with the patient. He did increase his walk distance  from the last 6 min walk. His progress was reviewed with him.           Discharge Exercise Prescription (Final Exercise Prescription Changes):     Exercise Prescription Changes - 04/18/16 1500    Exercise Review   Progression Yes  Response to Exercise   Blood Pressure (Admit) 100/58 mmHg   Blood Pressure (Exercise) 142/82 mmHg   Blood Pressure (Exit) 112/64 mmHg   Heart Rate (Admit) 58 bpm   Heart Rate  (Exercise) 105 bpm   Heart Rate (Exit) 94 bpm   Oxygen Saturation (Admit) 98 %   Oxygen Saturation (Exercise) 97 %   Oxygen Saturation (Exit) 98 %   Rating of Perceived Exertion (Exercise) 13   Perceived Dyspnea (Exercise) 2   Symptoms None   Intensity Rest + 30   Progression   Progression Continue to progress workloads to maintain intensity without signs/symptoms of physical distress.   Resistance Training   Training Prescription Yes   Weight 4   Reps 10-12   Treadmill   MPH 2.7   Grade 0   Minutes 15   Recumbant Bike   Level 6   RPM 70   Watts 15   NuStep   Level 5   Watts 75   Minutes 15       Nutrition:  Target Goals: Understanding of nutrition guidelines, daily intake of sodium <1533m, cholesterol <2066m calories 30% from fat and 7% or less from saturated fats, daily to have 5 or more servings of fruits and vegetables.  Biometrics:     Pre Biometrics - 01/27/16 1330    Pre Biometrics   Height 5' 5.75" (1.67 m)   Weight 179 lb (81.194 kg)   Waist Circumference 39 inches   Hip Circumference 42.35 inches   Waist to Hip Ratio 0.92 %   BMI (Calculated) 29.2         Post Biometrics - 04/18/16 1208     Post  Biometrics   Height '5\' 6"'  (1.676 m)   Weight 177 lb 1.6 oz (80.332 kg)   Waist Circumference 39.75 inches   Hip Circumference 41.25 inches   Waist to Hip Ratio 0.96 %   BMI (Calculated) 28.6      Nutrition Therapy Plan and Nutrition Goals:     Nutrition Therapy & Goals - 01/27/16 1416    Intervention Plan   Intervention (read-only) Using nutrition plan and personal goals to gain a healthy nutrition lifestyle. Add exercise as prescribed.      Nutrition Discharge: Rate Your Plate Scores:   Psychosocial: Target Goals: Acknowledge presence or absence of depression, maximize coping skills, provide positive support system. Participant is able to verbalize types and ability to use techniques and skills needed for reducing stress and  depression.  Initial Review & Psychosocial Screening:     Initial Psych Review & Screening - 01/27/16 1420    Initial Review   Current issues with Current Sleep Concerns   Family Dynamics   Good Support System? Yes   Comments [facility] Several neighbors chaeck on him daily. He has a son and a daughter that he sees weekly and another that lives in [place] He is widowed for 5 years. JaOtilioill benifit from the class participation and the exercise.    Barriers   Psychosocial barriers to participate in program There are no identifiable barriers or psychosocial needs.;The patient should benefit from training in stress management and relaxation.   Screening Interventions   Interventions Encouraged to exercise      Quality of Life Scores:     Quality of Life - 01/27/16 1558    Quality of Life Scores   Health/Function Pre 10.22 %   Socioeconomic Pre 17.14 %  Psych/Spiritual Pre 19.71 %   Family Pre 24 %   GLOBAL Pre 15.22 %      PHQ-9:     Recent Review Flowsheet Data    Depression screen [facility] 2/9 01/27/2016   Decreased Interest 1   Down, Depressed, Hopeless 1   PHQ - 2 Score 2   Altered sleeping 1   Tired, decreased energy 3   Change in appetite 1   Feeling bad or failure about yourself  0   Trouble concentrating 1   Moving slowly or fidgety/restless 1   Suicidal thoughts 0   PHQ-9 Score 9   Difficult doing work/chores Somewhat difficult      Psychosocial Evaluation and Intervention:     Psychosocial Evaluation - 02/03/16 1045    Psychosocial Evaluation & Interventions   Interventions Encouraged to exercise with the program and follow exercise prescription   Comments Counselor met with Mr. Fortenberry today for initial psychosocial evaluation.  He is an 82 year old gentleman who has Pulmonary Fibrosis.  He has a good support system living in a retirement community as well as (2) adult children who live close by and  active involvement in his local church community.  Mr. Vandiver has some memory issues and struggled a little answering some of  counselor's questions.  He states that he has struggled with depression and anxiety for awhile but is not currently on any medications, although he states the Dr. prescribed something, but his "insurance won't cover it."  He states that his current mood is "okay" but due to his health and some of the medication reactions he has experienced, it "could be better."   He has goals to increase his stamina and strength in this program.  He is sleeping well and has a good appetite.  Mr. Carstens plans to supplement working out in this program with exercising in the gym at the retirement community where he resides.  Counselor will continue to follow up with him, especially on his mood.        Psychosocial Re-Evaluation:     Psychosocial Re-Evaluation      03/23/16 1101 04/20/16 1107 04/25/16 1146 04/27/16 1036     Psychosocial Re-Evaluation   Comments Counselor met with Mr. Wescoat for a follow up evaluation today.  He continues to struggle with his memory and has some hoarseness that he is seeing a doctor for later this week.  He reports enjoying this program and sleeping well most of the time.  He states his mood is about the same and his health issues continue to be his primary stressor.  Counselor will continue to follow with him while in this program.  Follow up with Mr. Busche today reporting this program has had some positive benefits for him, such as improved breathing; increased stamina and an ability to do things he was unable to do in the past.  He also reports having lost weight which helps him to move with greater ease.  Mr. Kohlbeck continues to mention that he has memory problems, but today he seemed more clear and aware than in the past.  He continues to sleep "okay" with interruptions several times by bathroom trips nightly.  Counselor commended Mr. Carline on his  progress made due to his hard work.   Follow up with Mr. Wieber today stating he "has been better." He continues to have a raspiness in his voice and this has been ongoing for several weeks.  He has mentioned it to his doctor without  change in meds, etc.  He also mentioned he has d/c his allergy medications several weeks ago because he "ran out of it."  Counselor mentioned obtaining some of this and see if it makes a difference in his symptoms.  He continues to report increased energy and ongoing memory problems, but his main concern was his raspy voice and some left side of his face discomfort (which has been looked at by a neurologist).  Counselor will continue to follow with Mr. Stewart in the future, especially his mood concerns.   Mr. Goeden continues to report not feeling well. He states he bought his allergy medication and began taking it yesterday, but hasn't noticed any benefit so far.  He was encouraged by counselor to let the nurses know this and that he may need to see his doctor soon.   One of the nurses did set him up an appointment with his doctor today at [facility] so he was encouraged to not work out today or until his doctor takes a look at him to see what is going on.  Mr. Bierlein is typically positive and pleasant and obviously is not his usual self currently.  Counselor will continue to follow with him.        Education: Education Goals: Education classes will be provided on a weekly basis, covering required topics. Participant will state understanding/return demonstration of topics presented.  Learning Barriers/Preferences:     Learning Barriers/Preferences - 01/27/16 1206    Learning Barriers/Preferences   Learning Barriers Hearing;Sight   Learning Preferences None      Education Topics: Initial Evaluation Education: - Verbal, written and demonstration of respiratory meds, RPE/PD scales, oximetry and breathing techniques. Instruction on use of nebulizers and MDIs: cleaning  and proper use, rinsing mouth with steroid doses and importance of monitoring MDI activations.          Pulmonary Rehab from 04/25/2016 in [facility] Cardiac and Pulmonary Rehab   Date  01/27/16   Educator  sb   Instruction Review Code  2- meets goals/outcomes      General Nutrition Guidelines/Fats and Fiber: -Group instruction provided by verbal, written material, models and posters to present the general guidelines for heart healthy nutrition. Gives an explanation and review of dietary fats and fiber.   Controlling Sodium/Reading Food Labels: -Group verbal and written material supporting the discussion of sodium use in heart healthy nutrition. Review and explanation with models, verbal and written materials for utilization of the food label.      Pulmonary Rehab from 04/25/2016 in [facility] Cardiac and Pulmonary Rehab   Date  04/04/16   Educator  CR   Instruction Review Code  2- meets goals/outcomes      Exercise Physiology & Risk Factors: - Group verbal and written instruction with models to review the exercise physiology of the cardiovascular system and associated critical values. Details cardiovascular disease risk factors and the goals associated with each risk factor.      Pulmonary Rehab from 04/25/2016 in [facility] Cardiac and Pulmonary Rehab   Date  03/16/16   Educator  SW   Instruction Review Code  2- meets goals/outcomes      Aerobic Exercise & Resistance Training: - Gives group verbal and written discussion on the health impact of inactivity. On the components of aerobic and resistive training programs and the benefits of this training and how to safely progress through these programs.   Flexibility, Balance, General Exercise Guidelines: - Provides group verbal and written instruction on the benefits  of flexibility and balance training programs. Provides general exercise guidelines with specific guidelines to those with heart or lung disease. Demonstration and skill practice provided.       Pulmonary Rehab from 04/25/2016 in [facility] Cardiac and Pulmonary Rehab   Date  02/03/16   Educator  RM   Instruction Review Code  2- meets goals/outcomes      Stress Management: - Provides group verbal and written instruction about the health risks of elevated stress, cause of high stress, and healthy ways to reduce stress.      Pulmonary Rehab from 04/25/2016 in [facility] Cardiac and Pulmonary Rehab   Date  02/10/16   Educator  [facility]   Instruction Review Code  2- meets goals/outcomes      Depression: - Provides group verbal and written instruction on the correlation between heart/lung disease and depressed mood, treatment options, and the stigmas associated with seeking treatment.      Pulmonary Rehab from 04/25/2016 in [facility] Cardiac and Pulmonary Rehab   Date  03/23/16   Educator  [facility]   Instruction Review Code  2- meets goals/outcomes      Exercise & Equipment Safety: - Individual verbal instruction and demonstration of equipment use and safety with use of the equipment.      Pulmonary Rehab from 04/25/2016 in [facility] Cardiac and Pulmonary Rehab   Date  01/27/16   Educator  Sb   Instruction Review Code  2- meets goals/outcomes      Infection Prevention: - Provides verbal and written material to individual with discussion of infection control including proper hand washing and proper equipment cleaning during exercise session.      Pulmonary Rehab from 04/25/2016 in [facility] Cardiac and Pulmonary Rehab   Date  01/27/16   Educator  SB   Instruction Review Code  2- meets goals/outcomes      Falls Prevention: - Provides verbal and written material to individual with discussion of falls prevention and safety.      Pulmonary Rehab from 04/25/2016 in [facility] Cardiac and Pulmonary Rehab   Date  01/27/16   Educator  SB   Instruction Review Code  2- meets goals/outcomes      Diabetes: - Individual verbal and written instruction to review signs/symptoms of diabetes, desired ranges of glucose level fasting,  after meals and with exercise. Advice that pre and post exercise glucose checks will be done for 3 sessions at entry of program.   Chronic Lung Diseases: - Group verbal and written instruction to review new updates, new respiratory medications, new advancements in procedures and treatments. Provide informative websites and "800" numbers of self-education.      Pulmonary Rehab from 04/25/2016 in [facility] Cardiac and Pulmonary Rehab   Date  04/25/16   Educator  L. Owens Shark, RT   Instruction Review Code  2- meets goals/outcomes      Lung Procedures: - Group verbal and written instruction to describe testing methods done to diagnose lung disease. Review the outcome of test results. Describe the treatment choices: Pulmonary Function Tests, ABGs and oximetry.   Energy Conservation: - Provide group verbal and written instruction for methods to conserve energy, plan and organize activities. Instruct on pacing techniques, use of adaptive equipment and posture/positioning to relieve shortness of breath.   Triggers: - Group verbal and written instruction to review types of environmental controls: home humidity, furnaces, filters, dust mite/pet prevention, HEPA vacuums. To discuss weather changes, air quality and the benefits of nasal washing.      Pulmonary  Rehab from 04/25/2016 in [facility] Cardiac and Pulmonary Rehab   Date  04/11/16   Educator  LB   Instruction Review Code  2- meets goals/outcomes      Exacerbations: - Group verbal and written instruction to provide: warning signs, infection symptoms, calling MD promptly, preventive modes, and value of vaccinations. Review: effective airway clearance, coughing and/or vibration techniques. Create an Sports administrator.      Pulmonary Rehab from 04/25/2016 in [facility] Cardiac and Pulmonary Rehab   Date  02/29/16   Educator  LB   Instruction Review Code  2- meets goals/outcomes      Oxygen: - Individual and group verbal and written instruction on oxygen therapy.  Includes supplement oxygen, available portable oxygen systems, continuous and intermittent flow rates, oxygen safety, concentrators, and Medicare reimbursement for oxygen.   Respiratory Medications: - Group verbal and written instruction to review medications for lung disease. Drug class, frequency, complications, importance of spacers, rinsing mouth after steroid MDI's, and proper cleaning methods for nebulizers.   AED/CPR: - Group verbal and written instruction with the use of models to demonstrate the basic use of the AED with the basic ABC's of resuscitation.      Pulmonary Rehab from 04/25/2016 in [facility] Cardiac and Pulmonary Rehab   Date  02/19/16   Educator  CE   Instruction Review Code  2- meets goals/outcomes      Breathing Retraining: - Provides individuals verbal and written instruction on purpose, frequency, and proper technique of diaphragmatic breathing and pursed-lipped breathing. Applies individual practice skills.      Pulmonary Rehab from 04/25/2016 in [facility] Cardiac and Pulmonary Rehab   Date  01/27/16   Educator  sB   Instruction Review Code  2- meets goals/outcomes      Anatomy and Physiology of the Lungs: - Group verbal and written instruction with the use of models to provide basic lung anatomy and physiology related to function, structure and complications of lung disease.      Pulmonary Rehab from 04/25/2016 in [facility] Cardiac and Pulmonary Rehab   Date  03/11/16   Educator  SJ RRT   Instruction Review Code  2- meets goals/outcomes      Heart Failure: - Group verbal and written instruction on the basics of heart failure: signs/symptoms, treatments, explanation of ejection fraction, enlarged heart and cardiomyopathy.      Pulmonary Rehab from 04/25/2016 in [facility] Cardiac and Pulmonary Rehab   Date  04/15/16   Educator  [place]   Instruction Review Code  2- meets goals/outcomes      Sleep Apnea: - Individual verbal and written instruction to review Obstructive Sleep  Apnea. Review of risk factors, methods for diagnosing and types of masks and machines for OSA.   Anxiety: - Provides group, verbal and written instruction on the correlation between heart/lung disease and anxiety, treatment options, and management of anxiety.   Relaxation: - Provides group, verbal and written instruction about the benefits of relaxation for patients with heart/lung disease. Also provides patients with examples of relaxation techniques.      Pulmonary Rehab from 04/25/2016 in [facility] Cardiac and Pulmonary Rehab   Date  02/24/16   Educator  [facility]   Instruction Review Code  2- Meets goals/outcomes      Knowledge Questionnaire Score:     Knowledge Questionnaire Score - 01/27/16 1403    Knowledge Questionnaire Score   Pre Score 6/10       Core Components/Risk Factors/Patient Goals at Admission:  Personal Goals and Risk Factors at Admission - 01/27/16 1422    Core Components/Risk Factors/Patient Goals on Admission   Sedentary Yes  Jac utilizes the Treadmill and weight machine at his residence. He tries 3-5 days a week if he is feeling well. His goal is to stay active.  He has recently had concerns with "dizziness" and he was advised to talk to his PMD about the symptoms.    Intervention (read-only) While in program, learn and follow the exercise prescription taught. Start at a low level workload and increase workload after able to maintain previous level for 30 minutes. Increase time before increasing intensity.   Improve shortness of breath with ADL's Yes  Nomar scored some fear with SOB, he stated he has SOB with exertion, that has learned to space his activities to be less SOB   Intervention (read-only) While in program, learn and follow the exercise prescription taught. Start at a low level workload and increase workload ad advised by the exercise physiologist. Increase time before increasing intensity.   Develop more efficient breathing techniques such as purse lipped  breathing and diaphragmatic breathing; and practicing self-pacing with activity Yes  Furqan instructed on purse lipped breathing and he had great return demonstration.   Intervention (read-only) While in program, learn and utilize the specific breathing techniques taught to you. Continue to practice and use the techniques as needed.   Increase knowledge of respiratory medications and ability to use respiratory devices properly  Yes  Javan has ventolin as needed, new nebuliser to use. Symbicort inhaler also.  Dejion did not have a spacer, will provide him with one and instruction for proper use next visit.   Intervention (read-only) While in program learn and demonstrate appropriate use of your oxygen therapy by increasing flow with exertion, manage oxygen tank operation, including continuous and intermittent flow.  Understanding oxygen is a drug ordered by your physician.   Hypertension Yes   Goal Participant will see blood pressure controlled within the values of 140/13m/Hg or within value directed by their physician.   Intervention (read-only) Provide nutrition & aerobic exercise along with prescribed medications to achieve BP 140/90 or less.   Lipids Yes   Goal Cholesterol controlled with medications as prescribed, with individualized exercise RX and with personalized nutrition plan. Value goals: LDL < 779m HDL > 4054mParticipant states understanding of desired cholesterol values and following prescriptions.   Intervention (read-only) Provide nutrition & aerobic exercise along with prescribed medications to achieve LDL <44m44mDL >40mg7mPersonal Goal Other Yes   Personal Goal Stay Active   Intervention Follow exercise prescription guidelines to gradually increase activity levels without increase in SOB.    Understand more about Heart/Pulmonary Disease. Yes  Raynell came to the program with a diagnosis of Asthma and Sleep apnea.  Caydyn Mirled interest in learning more about the disease process and what  he can do to keep it controlled.      Intervention While in program utilize professionals for any questions, and attend the education sessions. Great websites to use are www.americanheart.org or www.lung.org for reliable information.  Laderrick has stoppped using the CPAP,because he tried every method available to use the machine and he did not tolerate any method.      Core Components/Risk Factors/Patient Goals Review:      Goals and Risk Factor Review      02/03/16 1000 02/22/16 1000 02/29/16 1000 03/23/16 1624 04/13/16 1149   Core Components/Risk Factors/Patient Goals Review   Personal Goals  Review Develop more efficient breathing techniques such as purse lipped breathing and diaphragmatic breathing and practicing self-pacing with activity. Sedentary;Improve shortness of breath with ADL's;Increase knowledge of respiratory medications and ability to use respiratory devices properly.;Develop more efficient breathing techniques such as purse lipped breathing and diaphragmatic breathing and practicing self-pacing with activity. Increase knowledge of respiratory medications and ability to use respiratory devices properly. Improve shortness of breath with ADL's;Increase knowledge of respiratory medications and ability to use respiratory devices properly.;Develop more efficient breathing techniques such as purse lipped breathing and diaphragmatic breathing and practicing self-pacing with activity. Sedentary   Review  Mr Boylen states he has more energy, although he still has increased shortness of breath; he is using PLB and does find that technique helpful; Gave Mr Angus a Armed forces training and education officer for his Ventolin and Symbicort Mr Caryn Section is experiencing horseness. We discussed that his Symbicort may be causing this, and at his appointment with Dr Raul Del tomorrow, I suggested he mention this to Dr Raul Del.   He could possibly order another inhaler  which would not cause this side effect. Mr Maxson continues to use  his MDI's as prescribed and his aerochamber with his albuterol and Symbicort; He manages his shortness of breath with PLB and pacing which he uses at the [facility] gym as well. Kwane stated today that coming to Pulmonary Rehab has helped improve his leg strength and his endurance is improved too!   Expected Outcomes  Continue to improve energy and stamina with his exercise goals; follow-up on aerochamber use.  Continue progressing with his exercise goals and learning management of his lung condition. Continue to work with the exercise prescription and progression ,looking for continued increase in strength and endurance   Breathing Techniques (read-only)   Goals Progress/Improvement seen  Yes       Comments Reviewed PLB with Mr Stefan and encouraged him to use this technique with his exercise goals.         04/20/16 1000           Core Components/Risk Factors/Patient Goals Review   Personal Goals Review Sedentary;Increase Strength and Stamina;Improve shortness of breath with ADL's;Develop more efficient breathing techniques such as purse lipped breathing and diaphragmatic breathing and practicing self-pacing with activity.;Increase knowledge of respiratory medications and ability to use respiratory devices properly.       Review Mr Bollard is close to graduating. He has attended regularly, participated in all athe education, and will apply his gained knowledge to managing his asthma daily.  He has increased his exercise goals and will continue exercise at the [facility] gym at least 3d/wk.Marland Kitchen He uses PLB  with his exercise and uses the technique for shortness of breath during home activites. He is knowledgeable about his MDI's  and is compliant. He has improved his cough and is drinking more water to help control his cough.        Expected Outcomes Continue to exercise and be active at [facility] and continue managing his asthma.          Core Components/Risk Factors/Patient Goals at Discharge  (Final Review):      Goals and Risk Factor Review - 04/20/16 1000    Core Components/Risk Factors/Patient Goals Review   Personal Goals Review Sedentary;Increase Strength and Stamina;Improve shortness of breath with ADL's;Develop more efficient breathing techniques such as purse lipped breathing and diaphragmatic breathing and practicing self-pacing with activity.;Increase knowledge of respiratory medications and ability to use respiratory devices properly.   Review Mr Wittwer is  close to graduating. He has attended regularly, participated in all athe education, and will apply his gained knowledge to managing his asthma daily.  He has increased his exercise goals and will continue exercise at the [facility] gym at least 3d/wk.Marland Kitchen He uses PLB  with his exercise and uses the technique for shortness of breath during home activites. He is knowledgeable about his MDI's  and is compliant. He has improved his cough and is drinking more water to help control his cough.    Expected Outcomes Continue to exercise and be active at [facility] and continue managing his asthma.      ITP Comments:     ITP Comments      01/27/16 1409 01/27/16 1411 02/03/16 1000 02/03/16 1053 02/05/16 1056   ITP Comments Today Barnabas Lister attended his orientation. Program and personal goals were reviewed with Barnabas Lister.  Expectationis that Lawayne will meet his goals at the end of the 36 session. Review will be done during his program to set short term goals as needed.  At the end of the 6 minute walk, Pratt experienced an increase in his "dizziness" . This got better after a couple cups of water and sitting, but did not totally resolve. after discussion with jacj, I took him by wheelchair to the [facility] at his primary MD office. Aravind will be evaluated and treated at the clinic. BP was fine, Blood sugar check  was 50m/Dl.  Mr FKintzcompleted his first exercise day in [facility]. He reached his goals and did very well and will progress in  the program. JLuchianois expected to meet his goals by end of the program in 35 sessions.  Personal and exercise goals expected to be met in 33 more sessions. Progress on specific individualized goals will be charted in patient's ITP. Upon completion of the program the patient will be comfortable managing exercise goals and progression on their own.      02/08/16 1057 02/10/16 1107 02/12/16 1059 02/15/16 1041 02/24/16 1102   ITP Comments Personal and exercise goals expected to be met in 32 more sessions. Progress on specific individualized goals will be charted in patient's ITP. Upon completion of the program the patient will be comfortable managing exercise goals and progression on their own.  Personal and exercise goals expected to be met in 31 more sessions. Progress on specific individualized goals will be charted in patient's ITP. Upon completion of the program the patient will be comfortable managing exercise goals and progression on their own.  Personal and exercise goals expected to be met in 30 more sessions. Progress on specific individualized goals will be charted in patient's ITP. Upon completion of the program the patient will be comfortable managing exercise goals and progression on their own.  Personal and exercise goals expected to be met in 29 more sessiosns. See ITP for specific notes no goal progression.  Progress on specific individualized goals will be charted in patient's ITP. Upon completion of the program the patient will be comfortable managing exercise goals and progression on their own.      03/02/16 1452 03/09/16 1000 03/23/16 1000 03/28/16 1034     ITP Comments Mr FFilswas started on an antibiotic by Dr FRaul Deland referred to an Ear, Nose, Throat specialist. Mr FBueningdid not feel well today. He did take 2 puffs on his Ventolin MDI, but still decided to omit exercise for the day. Mr FViranihas an appointment with Dr BVirgia Landfor his cough on 03/25/2016.  Geoge reported that on Friday  the doctor ran that light down his throat. He said they will increase his reflux medicine. Burris reports he tried the nasal spray they gave him but he "can't take it since it makes him dizzy and he didn't feel well.        Comments:   30 day review

## 2016-05-02 NOTE — Progress Notes (Signed)
Daily Session Note  Patient Details  Name: Wesley Newton MRN: [id number] Date of Birth: March 09, 1927 Referring Provider:    Encounter Date: 05/02/2016  Check In:     Session Check In - 05/02/16 1117    Check-In   Location [facility]-Cardiac & Pulmonary Rehab   Staff Present Carson Myrtle, BS, RRT, Respiratory Therapist;Carroll Enterkin, RN, Moises Blood, BS, ACSM CEP, Exercise Physiologist;Camillo Quadros Oletta Darter, BA, ACSM CEP, Exercise Physiologist;Jessica [place], MA, ACSM RCEP, Exercise Physiologist   Supervising physician immediately available to respond to emergencies [facility] immediately available ER MD   Medication changes reported     No   Fall or balance concerns reported    No   Warm-up and Cool-down Performed on first and last piece of equipment   Resistance Training Performed Yes   VAD Patient? No   Pain Assessment   Currently in Pain? No/denies   Multiple Pain Sites No         Goals Met:  Proper associated with RPD/PD & O2 Sat Independence with exercise equipment Exercise tolerated well Strength training completed today  Goals Unmet:  Not Applicable  Comments:    Dr. Emily Filbert is Medical Director for [place] and [facility]monary Rehabilitation.

## 2016-05-04 DIAGNOSIS — G4730 Sleep apnea, unspecified: Secondary | ICD-10-CM

## 2016-05-04 DIAGNOSIS — J45998 Other asthma: Secondary | ICD-10-CM | POA: Diagnosis not present

## 2016-05-04 DIAGNOSIS — J45909 Unspecified asthma, uncomplicated: Secondary | ICD-10-CM

## 2016-05-04 NOTE — Progress Notes (Signed)
Pulmonary Individual Treatment Plan  Patient Details  Name: Wesley Newton MRN: [id number] Date of Birth: 1927/11/18 Referring Provider:  Dr Wallene Huh  Initial Encounter Date:       Pulmonary Rehab from 01/27/2016 in [facility]   Date  01/27/16      Visit Diagnosis: Asthma, chronic, unspecified asthma severity, uncomplicated  Sleep apnea  Patient's Home Medications on Admission:  Current outpatient prescriptions:    albuterol (PROVENTIL HFA;VENTOLIN HFA) 108 (90 Base) MCG/ACT inhaler, Inhale 2 puffs into the lungs every 6 (six) hours as needed for wheezing or shortness of breath., Disp: , Rfl:    albuterol (PROVENTIL) (2.5 MG/3ML) 0.083% nebulizer solution, Take 2.5 mg by nebulization every 6 (six) hours as needed for wheezing or shortness of breath., Disp: , Rfl:    aspirin EC 81 MG tablet, Take 81 mg by mouth daily. , Disp: , Rfl:    budesonide-formoterol (SYMBICORT) 160-4.5 MCG/ACT inhaler, Inhale 2 puffs into the lungs 2 (two) times daily., Disp: , Rfl:    finasteride (PROSCAR) 5 MG tablet, Take 1 tablet (5 mg total) by mouth daily., Disp: 30 tablet, Rfl: 3   fluticasone (FLONASE) 50 MCG/ACT nasal spray, Place into the nose., Disp: , Rfl:    ipratropium-albuterol (DUONEB) 0.5-2.5 (3) MG/3ML SOLN, Inhale into the lungs., Disp: , Rfl:    loratadine (ALLERGY) 10 MG dissolvable tablet, Take by mouth., Disp: , Rfl:    Multiple Vitamins-Minerals (PRESERVISION AREDS 2 PO), Take 1 capsule by mouth 2 (two) times daily., Disp: , Rfl:    naproxen (NAPROSYN) 375 MG tablet, Take 375 mg by mouth 3 (three) times daily as needed for mild pain. , Disp: , Rfl: 2   omeprazole (PRILOSEC) 40 MG capsule, Take 40 mg by mouth daily. , Disp: , Rfl: 4   simvastatin (ZOCOR) 40 MG tablet, Take 40 mg by mouth at bedtime. , Disp: , Rfl: 0   tamsulosin (FLOMAX) 0.4 MG CAPS capsule, Take 1 capsule (0.4 mg total) by mouth daily., Disp: 30 capsule, Rfl: 3   vitamin B-12  (CYANOCOBALAMIN) 1000 MCG tablet, Take 1,000 mcg by mouth daily., Disp: , Rfl:   Past Medical History: Past Medical History  Diagnosis Date   Cancer ([place])    DVT of leg (deep venous thrombosis) (HCC)    Emphysema lung (HCC)    Elevated PSA    BPH (benign prostatic hyperplasia)    COPD (chronic obstructive pulmonary disease) (HCC)    Nocturia    Pulmonary fibrosis (HCC)     Tobacco Use: History  Smoking status   Former Smoker  Smokeless tobacco   Not on file    Comment: quit 40 years    Labs: Recent Review Flowsheet Data    There is no flowsheet data to display.       ADL [facility]SD:     Pulmonary Assessment Scores      01/27/16 1136 01/27/16 1406 03/14/16 1000   ADL [facility]SD   ADL Phase Entry  Mid   SOB Score total  60 43   Rest  0 2   Walk  1 3   Stairs  3 4   Bath  4 2   Dress  4 2   Shop  2 1     05/04/16 0804 05/04/16 0806     ADL UCSD   ADL Phase -- Exit    SOB Score total  42    Rest  0    Walk  1  Stairs  3    Bath  2    Dress  3    Shop  1       Pulmonary Function Assessment:     Pulmonary Function Assessment - 01/27/16 1300    Initial Spirometry Results   FVC% 106 %   FEV1% 134 %   FEV1/FVC Ratio 95.65   Breath   Bilateral Breath Sounds Rales;Basilar;Inspiratory   Shortness of Breath Yes;Limiting activity      Exercise Target Goals:    Exercise Program Goal: Individual exercise prescription set with THRR, safety & activity barriers. Participant demonstrates ability to understand and report RPE using BORG scale, to self-measure pulse accurately, and to acknowledge the importance of the exercise prescription.  Exercise Prescription Goal: Starting with aerobic activity 30 plus minutes a day, 3 days per week for initial exercise prescription. Provide home exercise prescription and guidelines that participant acknowledges understanding prior to discharge.  Activity Barriers & Risk Stratification:     Activity Barriers &  Cardiac Risk Stratification - 01/27/16 1205    Activity Barriers & Cardiac Risk Stratification   Activity Barriers Balance Concerns;Shortness of Breath;Deconditioning      6 Minute Walk:     6 Minute Walk      01/27/16 1331 03/14/16 1109 04/18/16 1205   6 Minute Walk   Phase Initial Mid Program Discharge   Distance 1245 feet 1300 feet 1390 feet   Distance % Change  4 %    Walk Time 6 minutes 6 minutes 6 minutes   # of Rest Breaks 0  0   MPH 2.4     RPE _0 Perceived Dyspnea  _1 Symptoms Yes (comment) No No   Comments Light headed after walking, blurred vision, short of breath  no symtpoms reported. Patient stated that he felt pretty good during the walk    Resting HR 90 bpm 70 bpm 76 bpm   Resting BP 128/68 mmHg 124/72 mmHg 100/58 mmHg   Max Ex. HR 116 bpm 94 bpm 102 bpm   Max Ex. BP 150/60 mmHg 148/74 mmHg 142/82 mmHg      Initial Exercise Prescription:     Initial Exercise Prescription - 01/27/16 1300    Date of Initial Exercise RX and Referring Provider   Date 01/27/16   Treadmill   MPH 2   Grade 0   Minutes 10  Use intervals if necessary such as 2, 5 min intervals   Bike   Level 0.4   Watts 12   Minutes 10   Recumbant Bike   Level 3   RPM 40   Watts 25   Minutes 10   NuStep   Level 2   Watts 25   Minutes 10   Arm Ergometer   Level 1   Watts 8   Minutes 10   Arm/Foot Ergometer   Level 4   Watts 12   Minutes 10   Cybex   Level 2   RPM 50   Minutes 10   Recumbant Elliptical   Level 1   RPM 35   Watts 10   Minutes 10   REL-XR   Level 2   Watts 35   Minutes 10   T5 Nustep   Level 1   Watts 10   Minutes 10   Biostep-RELP   Level 2   Watts 15   Minutes 10   Prescription Details   Frequency (times per week) 3  Duration Progress to 30 minutes of continuous aerobic without signs/symptoms of physical distress   Intensity   THRR REST +  30   Ratings of Perceived Exertion 11-13   Perceived Dyspnea 2-4   Progression    Progression Continue progressive overload as per policy without signs/symptoms or physical distress.   Resistance Training   Training Prescription Yes   Weight 2   Reps 10-12      Perform Capillary Blood Glucose checks as needed.  Exercise Prescription Changes:     Exercise Prescription Changes      01/27/16 1100 02/03/16 1200 02/05/16 1000 02/08/16 1000 02/10/16 1100   Exercise Review   Progression   Yes Yes Yes   Response to Exercise   Blood Pressure (Admit) 128/68 mmHg 122/70 mmHg      Blood Pressure (Exercise) 150/60 mmHg 136/60 mmHg      Blood Pressure (Exit) 128/62 mmHg 124/76 mmHg      Heart Rate (Admit) 90 bpm 92 bpm      Heart Rate (Exercise) 116 bpm 111 bpm      Heart Rate (Exit) 96 bpm 104 bpm      Oxygen Saturation (Admit) 96 % 95 %      Oxygen Saturation (Exercise) 90 % 92 %      Oxygen Saturation (Exit) 96 % 97 %      Rating of Perceived Exertion (Exercise)  11      Perceived Dyspnea (Exercise)  3      Symptoms  Heavy cough, he says it is chronic None None None   Comments  First day of exercise! Patient was oriented to the gym and the equipment functions and settings. Procedures and policies of the gym were outlined and explained. The patient's individual exercise prescription and treatment plan were reviewed with them. All starting workloads were established based on the results of the functional testing  done at the initial intake visit. The plan for exercise progression was also introduced and progression will be customized based on the patient's performance and goals Reviewed individualized exercise prescription and made increases per departmental policy. Exercise increases were discussed with the patient and they were able to perform the new work loads without issue (no signs or symptoms).  Demone's stamina is increasing and this is reflected in longer exercise times. He is not quite exercising for the full class time. Phu continues to increase in stamina and has  progressed on the TM which is his most difficult machine. He reports feeling more confident in his walking and has noticed the improvement    Duration  Progress to 30 minutes of continuous aerobic without signs/symptoms of physical distress Progress to 30 minutes of continuous aerobic without signs/symptoms of physical distress Progress to 30 minutes of continuous aerobic without signs/symptoms of physical distress Progress to 30 minutes of continuous aerobic without signs/symptoms of physical distress   Intensity  Rest + 30 Rest + 30 Rest + 30 Rest + 30   Progression   Progression  Continue progressive overload as per policy without signs/symptoms or physical distress. Continue progressive overload as per policy without signs/symptoms or physical distress. Continue progressive overload as per policy without signs/symptoms or physical distress. Continue progressive overload as per policy without signs/symptoms or physical distress.   Resistance Training   Training Prescription (read-only)  Yes Yes Yes Yes   Weight (read-only)  _0 Reps (read-only)  10-12 10-12 10-12 10-12   Treadmill   MPH (read-only)  2  _0 Grade (read-only)  0 0 0 0   Minutes (read-only)  10  5 min intervals x _1 min intervals x _2 min intervals x 2 12   Recumbant Bike   Level (read-only)  _3 RPM (read-only)  60 60 60 60   Minutes (read-only)  _4 NuStep   Level (read-only)  _5 Watts (read-only)  _6 Minutes (read-only)  _7 02/12/16 1100 02/15/16 1000 02/19/16 1100 02/22/16 1100 03/07/16 1100   Exercise Review   Progression _8    Response to Exercise   Blood Pressure (Admit)    132/68 mmHg    Blood Pressure (Exercise)    126/74 mmHg    Blood Pressure (Exit)    124/62 mmHg    Heart Rate (Admit)    82 bpm    Heart Rate (Exercise)    96 bpm    Heart Rate (Exit)    105 bpm    Oxygen Saturation (Admit)    96 %    Oxygen Saturation  (Exercise)    95 %    Oxygen Saturation (Exit)    98 %    Rating of Perceived Exertion (Exercise)    13    Perceived Dyspnea (Exercise)    3    Symptoms _9    Comments Nethaniel continues to increase in stamina and has progressed on the TM which is his most difficult machine. He reports feeling more confident in his walking and has noticed the improvement  Davonta continues to increase in stamina and has progressed on the TM which is his most difficult machine. Xzander increased his time on the TM. These changes were reviewed with him and he tolerated this increase with no signs or symptoms.  Mert continues to increase in stamina and has progressed on the TM which is his most difficult machine. Jon increased his time on the TM. These changes were reviewed with him and he tolerated this increase with no signs or symptoms.  Increases made to the patient's exercise prescription were reviewed with the patient. He tolerated these changes with no signs or sypmtoms.  Increases made to the patient's exercise prescription were reviewed with the patient. He tolerated these changes with no signs or sypmtoms.    Duration Progress to 30 minutes of continuous aerobic without signs/symptoms of physical distress Progress to 30 minutes of continuous aerobic without signs/symptoms of physical distress Progress to 30 minutes of continuous aerobic without signs/symptoms of physical distress Progress to 30 minutes of continuous aerobic without signs/symptoms of physical distress Progress to 30 minutes of continuous aerobic without signs/symptoms of physical distress   Intensity Rest + 30 Rest + 30 Rest + 30 Rest + 30 Rest + 30   Progression   Progression Continue progressive overload as per policy without signs/symptoms or physical distress. Continue progressive overload as per policy without signs/symptoms or physical distress. Continue progressive overload as per policy without signs/symptoms or physical distress.  Continue progressive overload as per policy without signs/symptoms or physical distress. Continue progressive overload as per policy without signs/symptoms or physical distress.   Resistance Training   Training Prescription (read-only) Yes Yes      Weight (read-only) 2 3      Reps (read-only) 10-12 10-12  Treadmill   MPH   2.2 2.2 2.2   Grade    0 0   Minutes    15 15   MPH (read-only) 2 2      Grade (read-only) 0 0      Minutes (read-only) 12 15      Recumbant Bike   Level    4 5   RPM    60 65   Watts    15 15   Level (read-only) 3 3      RPM (read-only) 60 60      Minutes (read-only) 15 15      NuStep   Level    5 5   Watts    50 50   Minutes    15 15   Level (read-only) 4 4      Watts (read-only) 25 25      Minutes (read-only) 15 15        03/14/16 1000 03/18/16 1000 03/25/16 1100 04/04/16 1200 04/06/16 1100   Exercise Review   Progression Yes Yes Yes     Response to Exercise   Blood Pressure (Admit)    110/60 mmHg    Blood Pressure (Exercise)    130/84 mmHg    Blood Pressure (Exit)    122/50 mmHg    Heart Rate (Admit)    83 bpm    Heart Rate (Exercise)    107 bpm    Heart Rate (Exit)    87 bpm    Oxygen Saturation (Admit)    95 %    Oxygen Saturation (Exercise)    92 %    Oxygen Saturation (Exit)    96 %    Rating of Perceived Exertion (Exercise)    15    Perceived Dyspnea (Exercise)    4    Symptoms None None None None    Comments TM speed has been increased to support Wynn's strength and stamina gains and to continue to progress his aerobic capacity via walking. Making steady increases on the TM.  Making steady increases on the TM.  Exericse increases were discussed with patient and he tolerated them with no signs or symptoms.  Exericse increases were discussed with patient and he tolerated them with no signs or symptoms.    Duration Progress to 30 minutes of continuous aerobic without signs/symptoms of physical distress Progress to 30 minutes of continuous  aerobic without signs/symptoms of physical distress Progress to 30 minutes of continuous aerobic without signs/symptoms of physical distress Progress to 45 minutes of aerobic exercise without signs/symptoms of physical distress    Intensity Rest + 30 Rest + 30 Rest + 30 Rest + 30 Rest + 30   Progression   Progression Continue progressive overload as per policy without signs/symptoms or physical distress. Continue progressive overload as per policy without signs/symptoms or physical distress. Continue progressive overload as per policy without signs/symptoms or physical distress. Continue progressive overload as per policy without signs/symptoms or physical distress. Continue progressive overload as per policy without signs/symptoms or physical distress.   Resistance Training   Training Prescription _0    Weight _1 Reps 10-12 10-12 10-12 10-12 10-12   Treadmill   MPH 2.4 2.5 2.5 2.5 2.7   Grade 0 0 0 0 0   Minutes _2 Recumbant Bike   Level _3 RPM 65 65 65 63 63  Watts _0 NuStep   Level _1 Watts 50 50 75 75 60   Minutes _2 04/18/16 1200 04/18/16 1500 05/04/16 0800       Exercise Review   Progression Yes Yes Yes     Response to Exercise   Blood Pressure (Admit)  100/58 mmHg 112/64 mmHg  Data from 05/02/2016     Blood Pressure (Exercise)  142/82 mmHg 132/70 mmHg     Blood Pressure (Exit)  112/64 mmHg 128/60 mmHg     Heart Rate (Admit)  58 bpm 95 bpm     Heart Rate (Exercise)  105 bpm 112 bpm     Heart Rate (Exit)  94 bpm 102 bpm     Oxygen Saturation (Admit)  98 % 98 %     Oxygen Saturation (Exercise)  97 % 91 %     Oxygen Saturation (Exit)  98 % 97 %     Rating of Perceived Exertion (Exercise)  13 15     Perceived Dyspnea (Exercise)  2 4     Symptoms  None None     Comments Exericse increases were discussed with patient and he tolerated them with no signs or symptoms.        Intensity Rest + 30  Rest + 30 Rest + 30     Progression   Progression Continue progressive overload as per policy without signs/symptoms or physical distress. Continue to progress workloads to maintain intensity without signs/symptoms of physical distress. Continue to progress workloads to maintain intensity without signs/symptoms of physical distress.     Resistance Training   Training Prescription Yes Yes Yes     Weight _3 Reps 10-12 10-12 10-12     Treadmill   MPH 2.7 2.7      Grade 0 0      Minutes 15 15      Recumbant Bike   Level 6 6      RPM 70 70      Watts 15 15      NuStep   Level 6 5      Watts 60 75      Minutes 15 15      Home Exercise Plan   Plans to continue exercise at   [employer] (comment)     Frequency   Add 3 additional days to program exercise sessions.  Plans to exercise at [facility]        Exercise Comments:     Exercise Comments      03/07/16 1104 03/14/16 1113 03/21/16 1517 04/08/16 1106 04/13/16 1148   Exercise Comments Reviewed individualized exercise prescription and made increases per departmental policy. Exercise increases were discussed with the patient and they were able to perform the new work loads without issue (no signs or symptoms).  Otilio did his mid-evaluation 6 min walk today. He tolerated this assessment well and improved 4% in his walk distance as compared to his initial assessment. Vital signs were all within acceptable ranges during this assessment. See 6 min walk data for detailed report.  Mr Drab has been exercising at [facility] gym 1 or 2 days/week during [facility]. He uses the TM, NS, and BX for at least 30 mins. He enjoys being active and appreciate the access to a gym at his retirement community. Did 5 minute warm up on Recubment  bike but then did 14 minutes on RB. C/o cough and productive yellow green sputum so I sent him to go see his MD or urgent Care [place] stated today that his legs are stronger and he has more endurance since he  started this Pulmonary Rehab program.      04/18/16 1209           Exercise Comments Administered 6 min walk test with the patient. He did increase his walk distance  from the last 6 min walk. His progress was reviewed with him.           Discharge Exercise Prescription (Final Exercise Prescription Changes):     Exercise Prescription Changes - 05/04/16 0800    Exercise Review   Progression Yes   Response to Exercise   Blood Pressure (Admit) 112/64 mmHg  Data from 05/02/2016   Blood Pressure (Exercise) 132/70 mmHg   Blood Pressure (Exit) 128/60 mmHg   Heart Rate (Admit) 95 bpm   Heart Rate (Exercise) 112 bpm   Heart Rate (Exit) 102 bpm   Oxygen Saturation (Admit) 98 %   Oxygen Saturation (Exercise) 91 %   Oxygen Saturation (Exit) 97 %   Rating of Perceived Exertion (Exercise) 15   Perceived Dyspnea (Exercise) 4   Symptoms None   Intensity Rest + 30   Progression   Progression Continue to progress workloads to maintain intensity without signs/symptoms of physical distress.   Resistance Training   Training Prescription Yes   Weight 4   Reps 10-12   Home Exercise Plan   Plans to continue exercise at [facility] (comment)   Frequency Add 3 additional days to program exercise sessions.  Plans to exercise at [facility]       Nutrition:  Target Goals: Understanding of nutrition guidelines, daily intake of sodium <1584m, cholesterol <2030m calories 30% from fat and 7% or less from saturated fats, daily to have 5 or more servings of fruits and vegetables.  Biometrics:     Pre Biometrics - 01/27/16 1330    Pre Biometrics   Height 5' 5.75" (1.67 m)   Weight 179 lb (81.194 kg)   Waist Circumference 39 inches   Hip Circumference 42.35 inches   Waist to Hip Ratio 0.92 %   BMI (Calculated) 29.2         Post Biometrics - 04/18/16 1208     Post  Biometrics   Height _0  (1.676 m)   Weight 177 lb 1.6 oz (80.332 kg)   Waist Circumference 39.75 inches   Hip  Circumference 41.25 inches   Waist to Hip Ratio 0.96 %   BMI (Calculated) 28.6      Nutrition Therapy Plan and Nutrition Goals:     Nutrition Therapy & Goals - 01/27/16 1416    Intervention Plan   Intervention (read-only) Using nutrition plan and personal goals to gain a healthy nutrition lifestyle. Add exercise as prescribed.      Nutrition Discharge: Rate Your Plate Scores:   Psychosocial: Target Goals: Acknowledge presence or absence of depression, maximize coping skills, provide positive support system. Participant is able to verbalize types and ability to use techniques and skills needed for reducing stress and depression.  Initial Review & Psychosocial Screening:     Initial Psych Review & Screening - 01/27/16 1420    Initial Review   Current issues with Current Sleep Concerns   Family Dynamics   Good Support System? Yes   Comments JaKalijahas a good support system  at [facility] where he resides. Several neighbors chaeck on him daily. He has a son and a daughter that he sees weekly and another that lives in [place].  He is widowed for 5 years. Travares will benifit from the class participation and the exercise.    Barriers   Psychosocial barriers to participate in program There are no identifiable barriers or psychosocial needs.;The patient should benefit from training in stress management and relaxation.   Screening Interventions   Interventions Encouraged to exercise      Quality of Life Scores:     Quality of Life - 05/04/16 0807    Quality of Life Scores   Health/Function Pre 10.22 %   Health/Function Post 20.07 %   Health/Function % Change 96.38 %   Socioeconomic Pre 17.14 %   Socioeconomic Post 26.25 %   Socioeconomic % Change  53.15 %   Psych/Spiritual Pre 19.71 %   Psych/Spiritual Post 24 %   Psych/Spiritual % Change 21.77 %   Family Pre 24 %   Family Post 26.1 %   Family % Change 8.75 %   GLOBAL Pre 15.22 %   GLOBAL Post 23.13 %   GLOBAL % Change 51.97  %      PHQ-9:     Recent Review Flowsheet Data    Depression screen [facility] 2/9 05/04/2016 01/27/2016   Decreased Interest 0 1   Down, Depressed, Hopeless 0 1   PHQ - 2 Score 0 2   Altered sleeping 0 1   Tired, decreased energy 1 3   Change in appetite 0 1   Feeling bad or failure about yourself  0 0   Trouble concentrating 0 1   Moving slowly or fidgety/restless 0 1   Suicidal thoughts 0 0   PHQ-9 Score 1 9   Difficult doing work/chores Somewhat difficult Somewhat difficult      Psychosocial Evaluation and Intervention:     Psychosocial Evaluation - 02/03/16 1045    Psychosocial Evaluation & Interventions   Interventions Encouraged to exercise with the program and follow exercise prescription   Comments Counselor met with Mr. Trebilcock today for initial psychosocial evaluation.  He is an 82 year old gentleman who has Pulmonary Fibrosis.  He has a good support system living in a retirement community as well as (2) adult children who live close by and active involvement in his local church community.  Mr. Wilczynski has some memory issues and struggled a little answering some of  counselor's questions.  He states that he has struggled with depression and anxiety for awhile but is not currently on any medications, although he states the Dr. prescribed something, but his "insurance won't cover it."  He states that his current mood is "okay" but due to his health and some of the medication reactions he has experienced, it "could be better."   He has goals to increase his stamina and strength in this program.  He is sleeping well and has a good appetite.  Mr. Lady plans to supplement working out in this program with exercising in the gym at the retirement community where he resides.  Counselor will continue to follow up with him, especially on his mood.        Psychosocial Re-Evaluation:     Psychosocial Re-Evaluation      03/23/16 1101 04/20/16 1107 04/25/16 1146 04/27/16 1036      Psychosocial Re-Evaluation   Comments Counselor met with Mr. Hoffert for a follow up evaluation today.  He  continues to struggle with his memory and has some hoarseness that he is seeing a doctor for later this week.  He reports enjoying this program and sleeping well most of the time.  He states his mood is about the same and his health issues continue to be his primary stressor.  Counselor will continue to follow with him while in this program.  Follow up with Mr. Olund today reporting this program has had some positive benefits for him, such as improved breathing; increased stamina and an ability to do things he was unable to do in the past.  He also reports having lost weight which helps him to move with greater ease.  Mr. Cervini continues to mention that he has memory problems, but today he seemed more clear and aware than in the past.  He continues to sleep "okay" with interruptions several times by bathroom trips nightly.  Counselor commended Mr. Dickard on his progress made due to his hard work.   Follow up with Mr. Betsch today stating he "has been better." He continues to have a raspiness in his voice and this has been ongoing for several weeks.  He has mentioned it to his doctor without change in meds, etc.  He also mentioned he has d/c his allergy medications several weeks ago because he "ran out of it."  Counselor mentioned obtaining some of this and see if it makes a difference in his symptoms.  He continues to report increased energy and ongoing memory problems, but his main concern was his raspy voice and some left side of his face discomfort (which has been looked at by a neurologist).  Counselor will continue to follow with Mr. Duris in the future, especially his mood concerns.   Mr. Belger continues to report not feeling well. He states he bought his allergy medication and began taking it yesterday, but hasn't noticed any benefit so far.  He was encouraged by counselor to let the  nurses know this and that he may need to see his doctor soon.   One of the nurses did set him up an appointment with his doctor today at [facility] so he was encouraged to not work out today or until his doctor takes a look at him to see what is going on.  Mr. Edmondson is typically positive and pleasant and obviously is not his usual self currently.  Counselor will continue to follow with him.        Education: Education Goals: Education classes will be provided on a weekly basis, covering required topics. Participant will state understanding/return demonstration of topics presented.  Learning Barriers/Preferences:     Learning Barriers/Preferences - 01/27/16 1206    Learning Barriers/Preferences   Learning Barriers Hearing;Sight   Learning Preferences None      Education Topics: Initial Evaluation Education: - Verbal, written and demonstration of respiratory meds, RPE/PD scales, oximetry and breathing techniques. Instruction on use of nebulizers and MDIs: cleaning and proper use, rinsing mouth with steroid doses and importance of monitoring MDI activations.          Pulmonary Rehab from 04/25/2016 in [facility] Cardiac and Pulmonary Rehab   Date  01/27/16   Educator  sb   Instruction Review Code  2- meets goals/outcomes      General Nutrition Guidelines/Fats and Fiber: -Group instruction provided by verbal, written material, models and posters to present the general guidelines for heart healthy nutrition. Gives an explanation and review of dietary fats and fiber.   Controlling Sodium/Reading Food  Labels: -Group verbal and written material supporting the discussion of sodium use in heart healthy nutrition. Review and explanation with models, verbal and written materials for utilization of the food label.      Pulmonary Rehab from 04/25/2016 in [facility] Cardiac and Pulmonary Rehab   Date  04/04/16   Educator  CR   Instruction Review Code  2- meets goals/outcomes      Exercise Physiology & Risk  Factors: - Group verbal and written instruction with models to review the exercise physiology of the cardiovascular system and associated critical values. Details cardiovascular disease risk factors and the goals associated with each risk factor.      Pulmonary Rehab from 04/25/2016 in [facility] Cardiac and Pulmonary Rehab   Date  03/16/16   Educator  SW   Instruction Review Code  2- meets goals/outcomes      Aerobic Exercise & Resistance Training: - Gives group verbal and written discussion on the health impact of inactivity. On the components of aerobic and resistive training programs and the benefits of this training and how to safely progress through these programs.   Flexibility, Balance, General Exercise Guidelines: - Provides group verbal and written instruction on the benefits of flexibility and balance training programs. Provides general exercise guidelines with specific guidelines to those with heart or lung disease. Demonstration and skill practice provided.      Pulmonary Rehab from 04/25/2016 in [facility] Cardiac and Pulmonary Rehab   Date  02/03/16   Educator  RM   Instruction Review Code  2- meets goals/outcomes      Stress Management: - Provides group verbal and written instruction about the health risks of elevated stress, cause of high stress, and healthy ways to reduce stress.      Pulmonary Rehab from 04/25/2016 in [facility] Cardiac and Pulmonary Rehab   Date  02/10/16   Educator  [facility]   Instruction Review Code  2- meets goals/outcomes      Depression: - Provides group verbal and written instruction on the correlation between heart/lung disease and depressed mood, treatment options, and the stigmas associated with seeking treatment.      Pulmonary Rehab from 04/25/2016 in [facility] Cardiac and Pulmonary Rehab   Date  03/23/16   Educator  [facility]   Instruction Review Code  2- meets goals/outcomes      Exercise & Equipment Safety: - Individual verbal instruction and demonstration of equipment  use and safety with use of the equipment.      Pulmonary Rehab from 04/25/2016 in [facility] Cardiac and Pulmonary Rehab   Date  01/27/16   Educator  Sb   Instruction Review Code  2- meets goals/outcomes      Infection Prevention: - Provides verbal and written material to individual with discussion of infection control including proper hand washing and proper equipment cleaning during exercise session.      Pulmonary Rehab from 04/25/2016 in [facility] Cardiac and Pulmonary Rehab   Date  01/27/16   Educator  SB   Instruction Review Code  2- meets goals/outcomes      Falls Prevention: - Provides verbal and written material to individual with discussion of falls prevention and safety.      Pulmonary Rehab from 04/25/2016 in [facility] Cardiac and Pulmonary Rehab   Date  01/27/16   Educator  SB   Instruction Review Code  2- meets goals/outcomes      Diabetes: - Individual verbal and written instruction to review signs/symptoms of diabetes, desired ranges of glucose level fasting,  after meals and with exercise. Advice that pre and post exercise glucose checks will be done for 3 sessions at entry of program.   Chronic Lung Diseases: - Group verbal and written instruction to review new updates, new respiratory medications, new advancements in procedures and treatments. Provide informative websites and "800" numbers of self-education.      Pulmonary Rehab from 04/25/2016 in [facility] Cardiac and Pulmonary Rehab   Date  04/25/16   Educator  L. Owens Shark, RT   Instruction Review Code  2- meets goals/outcomes      Lung Procedures: - Group verbal and written instruction to describe testing methods done to diagnose lung disease. Review the outcome of test results. Describe the treatment choices: Pulmonary Function Tests, ABGs and oximetry.   Energy Conservation: - Provide group verbal and written instruction for methods to conserve energy, plan and organize activities. Instruct on pacing techniques, use of adaptive  equipment and posture/positioning to relieve shortness of breath.   Triggers: - Group verbal and written instruction to review types of environmental controls: home humidity, furnaces, filters, dust mite/pet prevention, HEPA vacuums. To discuss weather changes, air quality and the benefits of nasal washing.      Pulmonary Rehab from 04/25/2016 in [facility] Cardiac and Pulmonary Rehab   Date  04/11/16   Educator  LB   Instruction Review Code  2- meets goals/outcomes      Exacerbations: - Group verbal and written instruction to provide: warning signs, infection symptoms, calling MD promptly, preventive modes, and value of vaccinations. Review: effective airway clearance, coughing and/or vibration techniques. Create an Sports administrator.      Pulmonary Rehab from 04/25/2016 in [facility] Cardiac and Pulmonary Rehab   Date  02/29/16   Educator  LB   Instruction Review Code  2- meets goals/outcomes      Oxygen: - Individual and group verbal and written instruction on oxygen therapy. Includes supplement oxygen, available portable oxygen systems, continuous and intermittent flow rates, oxygen safety, concentrators, and Medicare reimbursement for oxygen.   Respiratory Medications: - Group verbal and written instruction to review medications for lung disease. Drug class, frequency, complications, importance of spacers, rinsing mouth after steroid MDI's, and proper cleaning methods for nebulizers.   AED/CPR: - Group verbal and written instruction with the use of models to demonstrate the basic use of the AED with the basic ABC's of resuscitation.      Pulmonary Rehab from 04/25/2016 in [facility] Cardiac and Pulmonary Rehab   Date  02/19/16   Educator  CE   Instruction Review Code  2- meets goals/outcomes      Breathing Retraining: - Provides individuals verbal and written instruction on purpose, frequency, and proper technique of diaphragmatic breathing and pursed-lipped breathing. Applies individual practice  skills.      Pulmonary Rehab from 04/25/2016 in [facility] Cardiac and Pulmonary Rehab   Date  01/27/16   Educator  sB   Instruction Review Code  2- meets goals/outcomes      Anatomy and Physiology of the Lungs: - Group verbal and written instruction with the use of models to provide basic lung anatomy and physiology related to function, structure and complications of lung disease.      Pulmonary Rehab from 04/25/2016 in [facility] Cardiac and Pulmonary Rehab   Date  03/11/16   Educator  SJ RRT   Instruction Review Code  2- meets goals/outcomes      Heart Failure: - Group verbal and written instruction on the basics of heart failure: signs/symptoms, treatments,  explanation of ejection fraction, enlarged heart and cardiomyopathy.      Pulmonary Rehab from 04/25/2016 in [facility] Cardiac and Pulmonary Rehab   Date  04/15/16   Educator  [place]   Instruction Review Code  2- meets goals/outcomes      Sleep Apnea: - Individual verbal and written instruction to review Obstructive Sleep Apnea. Review of risk factors, methods for diagnosing and types of masks and machines for OSA.   Anxiety: - Provides group, verbal and written instruction on the correlation between heart/lung disease and anxiety, treatment options, and management of anxiety.   Relaxation: - Provides group, verbal and written instruction about the benefits of relaxation for patients with heart/lung disease. Also provides patients with examples of relaxation techniques.      Pulmonary Rehab from 04/25/2016 in [facility] Cardiac and Pulmonary Rehab   Date  02/24/16   Educator  [facility]   Instruction Review Code  2- Meets goals/outcomes      Knowledge Questionnaire Score:     Knowledge Questionnaire Score - 01/27/16 1403    Knowledge Questionnaire Score   Pre Score 6/10       Core Components/Risk Factors/Patient Goals at Admission:     Personal Goals and Risk Factors at Admission - 01/27/16 1422    Core Components/Risk Factors/Patient Goals on  Admission   Sedentary Yes  Verlyn utilizes the Treadmill and weight machine at his residence. He tries 3-5 days a week if he is feeling well. His goal is to stay active.  He has recently had concerns with "dizziness" and he was advised to talk to his PMD about the symptoms.    Intervention (read-only) While in program, learn and follow the exercise prescription taught. Start at a low level workload and increase workload after able to maintain previous level for 30 minutes. Increase time before increasing intensity.   Improve shortness of breath with ADL's Yes  Ezel scored some fear with SOB, he stated he has SOB with exertion, that has learned to space his activities to be less SOB   Intervention (read-only) While in program, learn and follow the exercise prescription taught. Start at a low level workload and increase workload ad advised by the exercise physiologist. Increase time before increasing intensity.   Develop more efficient breathing techniques such as purse lipped breathing and diaphragmatic breathing; and practicing self-pacing with activity Yes  Dovber instructed on purse lipped breathing and he had great return demonstration.   Intervention (read-only) While in program, learn and utilize the specific breathing techniques taught to you. Continue to practice and use the techniques as needed.   Increase knowledge of respiratory medications and ability to use respiratory devices properly  Yes  Azion has ventolin as needed, new nebuliser to use. Symbicort inhaler also.  Ladarrius did not have a spacer, will provide him with one and instruction for proper use next visit.   Intervention (read-only) While in program learn and demonstrate appropriate use of your oxygen therapy by increasing flow with exertion, manage oxygen tank operation, including continuous and intermittent flow.  Understanding oxygen is a drug ordered by your physician.   Hypertension Yes   Goal Participant will see blood pressure  controlled within the values of 140/66m/Hg or within value directed by their physician.   Intervention (read-only) Provide nutrition & aerobic exercise along with prescribed medications to achieve BP 140/90 or less.   Lipids Yes   Goal Cholesterol controlled with medications as prescribed, with individualized exercise RX and with personalized nutrition plan. Value  goals: LDL < '70mg'$ , HDL > '40mg'$ . Participant states understanding of desired cholesterol values and following prescriptions.   Intervention (read-only) Provide nutrition & aerobic exercise along with prescribed medications to achieve LDL '70mg'$ , HDL >'40mg'$ .   Personal Goal Other Yes   Personal Goal Stay Active   Intervention Follow exercise prescription guidelines to gradually increase activity levels without increase in SOB.    Understand more about Heart/Pulmonary Disease. Yes  Stoney came to the program with a diagnosis of Asthma and Sleep apnea.  Zaviyar voiced interest in learning more about the disease process and what he can do to keep it controlled.      Intervention While in program utilize professionals for any questions, and attend the education sessions. Great websites to use are www.americanheart.org or www.lung.org for reliable information.  Silvestre has stoppped using the CPAP,because he tried every method available to use the machine and he did not tolerate any method.      Core Components/Risk Factors/Patient Goals Review:      Goals and Risk Factor Review      02/03/16 1000 02/22/16 1000 02/29/16 1000 03/23/16 1624 04/13/16 1149   Core Components/Risk Factors/Patient Goals Review   Personal Goals Review Develop more efficient breathing techniques such as purse lipped breathing and diaphragmatic breathing and practicing self-pacing with activity. Sedentary;Improve shortness of breath with ADL's;Increase knowledge of respiratory medications and ability to use respiratory devices properly.;Develop more efficient breathing techniques  such as purse lipped breathing and diaphragmatic breathing and practicing self-pacing with activity. Increase knowledge of respiratory medications and ability to use respiratory devices properly. Improve shortness of breath with ADL's;Increase knowledge of respiratory medications and ability to use respiratory devices properly.;Develop more efficient breathing techniques such as purse lipped breathing and diaphragmatic breathing and practicing self-pacing with activity. Sedentary   Review  Mr Trompeter states he has more energy, although he still has increased shortness of breath; he is using PLB and does find that technique helpful; Gave Mr Rehman a Armed forces training and education officer for his Ventolin and Symbicort Mr Caryn Section is experiencing horseness. We discussed that his Symbicort may be causing this, and at his appointment with Dr Raul Del tomorrow, I suggested he mention this to Dr Raul Del.   He could possibly order another inhaler  which would not cause this side effect. Mr Burbano continues to use his MDI's as prescribed and his aerochamber with his albuterol and Symbicort; He manages his shortness of breath with PLB and pacing which he uses at the [facility] gym as well. Asriel stated today that coming to Pulmonary Rehab has helped improve his leg strength and his endurance is improved too!   Expected Outcomes  Continue to improve energy and stamina with his exercise goals; follow-up on aerochamber use.  Continue progressing with his exercise goals and learning management of his lung condition. Continue to work with the exercise prescription and progression ,looking for continued increase in strength and endurance   Breathing Techniques (read-only)   Goals Progress/Improvement seen  Yes       Comments Reviewed PLB with Mr Atkerson and encouraged him to use this technique with his exercise goals.         04/20/16 1000           Core Components/Risk Factors/Patient Goals Review   Personal Goals Review Sedentary;Increase  Strength and Stamina;Improve shortness of breath with ADL's;Develop more efficient breathing techniques such as purse lipped breathing and diaphragmatic breathing and practicing self-pacing with activity.;Increase knowledge of respiratory medications and ability to use respiratory  devices properly.       Review Mr Relph is close to graduating. He has attended regularly, participated in all athe education, and will apply his gained knowledge to managing his asthma daily.  He has increased his exercise goals and will continue exercise at the [facility] gym at least 3d/wk.Marland Kitchen He uses PLB  with his exercise and uses the technique for shortness of breath during home activites. He is knowledgeable about his MDI's  and is compliant. He has improved his cough and is drinking more water to help control his cough.        Expected Outcomes Continue to exercise and be active at [facility] and continue managing his asthma.          Core Components/Risk Factors/Patient Goals at Discharge (Final Review):      Goals and Risk Factor Review - 04/20/16 1000    Core Components/Risk Factors/Patient Goals Review   Personal Goals Review Sedentary;Increase Strength and Stamina;Improve shortness of breath with ADL's;Develop more efficient breathing techniques such as purse lipped breathing and diaphragmatic breathing and practicing self-pacing with activity.;Increase knowledge of respiratory medications and ability to use respiratory devices properly.   Review Mr Sheridan is close to graduating. He has attended regularly, participated in all athe education, and will apply his gained knowledge to managing his asthma daily.  He has increased his exercise goals and will continue exercise at the [facility] gym at least 3d/wk.Marland Kitchen He uses PLB  with his exercise and uses the technique for shortness of breath during home activites. He is knowledgeable about his MDI's  and is compliant. He has improved his cough and is drinking more water  to help control his cough.    Expected Outcomes Continue to exercise and be active at [facility] and continue managing his asthma.      ITP Comments:     ITP Comments      01/27/16 1409 01/27/16 1411 02/03/16 1000 02/03/16 1053 02/05/16 1056   ITP Comments Today Barnabas Lister attended his orientation. Program and personal goals were reviewed with Barnabas Lister.  Expectationis that Jonell will meet his goals at the end of the 36 session. Review will be done during his program to set short term goals as needed.  At the end of the 6 minute walk, Brently experienced an increase in his "dizziness" . This got better after a couple cups of water and sitting, but did not totally resolve. after discussion with jacj, I took him by wheelchair to the [facility] at his primary MD office. Ponciano will be evaluated and treated at the clinic. BP was fine, Blood sugar check  was 23m/Dl.  Mr FNeiscompleted his first exercise day in [facility]. He reached his goals and did very well and will progress in the program. JTorbenis expected to meet his goals by end of the program in 35 sessions.  Personal and exercise goals expected to be met in 33 more sessions. Progress on specific individualized goals will be charted in patient's ITP. Upon completion of the program the patient will be comfortable managing exercise goals and progression on their own.      02/08/16 1057 02/10/16 1107 02/12/16 1059 02/15/16 1041 02/24/16 1102   ITP Comments Personal and exercise goals expected to be met in 32 more sessions. Progress on specific individualized goals will be charted in patient's ITP. Upon completion of the program the patient will be comfortable managing exercise goals and progression on their own.  Personal and exercise  goals expected to be met in 31 more sessions. Progress on specific individualized goals will be charted in patient's ITP. Upon completion of the program the patient will be comfortable managing exercise goals and progression on  their own.  Personal and exercise goals expected to be met in 30 more sessions. Progress on specific individualized goals will be charted in patient's ITP. Upon completion of the program the patient will be comfortable managing exercise goals and progression on their own.  Personal and exercise goals expected to be met in 29 more sessiosns. See ITP for specific notes no goal progression.  Progress on specific individualized goals will be charted in patient's ITP. Upon completion of the program the patient will be comfortable managing exercise goals and progression on their own.      03/02/16 1452 03/09/16 1000 03/23/16 1000 03/28/16 1034 05/04/16 0812   ITP Comments Mr Brunton was started on an antibiotic by Dr Raul Del and referred to an Ear, Nose, Throat specialist. Mr Dubuque did not feel well today. He did take 2 puffs on his Ventolin MDI, but still decided to omit exercise for the day. Mr Gadbois has an appointment with Dr Virgia Land for his cough on 03/25/2016. Erhardt reported that on Friday the doctor ran that light down his throat. He said they will increase his reflux medicine. Aadil reports he tried the nasal spray they gave him but he "can't take it since it makes him dizzy and he didn't feel well.  Mr Iser is taking Levofloxin for 10 days at 73m daily. He is feeling better and states exercise went well today.      Comments: [facility]

## 2016-05-04 NOTE — Progress Notes (Signed)
Daily Session Note  Patient Details  Name: Wesley Newton MRN: [id number] Date of Birth: 02/03/1927 Referring Provider:    Encounter Date: 05/04/2016  Check In:     Session Check In - 05/04/16 1137    Check-In   Location [facility]   Staff Present Heath Lark, RN, BSN, CCRP;Laureen Owens Shark, BS, RRT, Respiratory Lennie Hummer, MA, ACSM RCEP, Exercise Physiologist;Firman Petrow Oletta Darter, BA, ACSM CEP, Exercise Physiologist;Diane Joya Gaskins, RN, BSN   Supervising physician immediately available to respond to emergencies [facility] immediately available ER MD   Physician(s) Dr. Joni Fears and Clearnce Hasten   Medication changes reported     No   Fall or balance concerns reported    No   Warm-up and Cool-down Performed on first and last piece of equipment   Resistance Training Performed Yes   VAD Patient? No   Pain Assessment   Currently in Pain? No/denies           Exercise Prescription Changes - 05/04/16 0800    Exercise Review   Progression Yes   Response to Exercise   Blood Pressure (Admit) 112/64 mmHg  Data from 05/02/2016   Blood Pressure (Exercise) 132/70 mmHg   Blood Pressure (Exit) 128/60 mmHg   Heart Rate (Admit) 95 bpm   Heart Rate (Exercise) 112 bpm   Heart Rate (Exit) 102 bpm   Oxygen Saturation (Admit) 98 %   Oxygen Saturation (Exercise) 91 %   Oxygen Saturation (Exit) 97 %   Rating of Perceived Exertion (Exercise) 15   Perceived Dyspnea (Exercise) 4   Symptoms None   Intensity Rest + 30   Progression   Progression Continue to progress workloads to maintain intensity without signs/symptoms of physical distress.   Resistance Training   Training Prescription Yes   Weight 4   Reps 10-12   Home Exercise Plan   Plans to continue exercise at [facility] (comment)   Frequency Add 3 additional days to program exercise sessions.  Plans to exercise at [place] Met:  Proper associated with RPD/PD & O2 Sat Independence with  exercise equipment Exercise tolerated well Strength training completed today  Goals Unmet:  Not Applicable  Comments: Discharged today    Dr. Emily Filbert is Medical Director for [facility] and [facility].

## 2016-05-04 NOTE — Progress Notes (Signed)
Discharge Summary  Patient Details  Name: Wesley Newton MRN: [id number] Date of Birth: Dec 01, 1927 Referring Provider: Dr Wallene Huh    Number of Visits: 36  Reason for Discharge:  Patient reached a stable level of exercise. Patient independent in their exercise.  Smoking History:  History  Smoking status   Former Smoker  Smokeless tobacco   Not on file    Comment: quit 40 years    Diagnosis:  Asthma, chronic, unspecified asthma severity, uncomplicated - Plan: PULMONARY REHAB 30 DAY REVIEW  Sleep apnea - Plan: PULMONARY REHAB 30 DAY REVIEW  ADL [facility]:     Pulmonary Assessment Scores      01/27/16 1136 01/27/16 1406 03/14/16 1000   ADL [facility]   ADL Phase Entry  Mid   SOB Score total  60 43   Rest  0 2   Walk  1 3   Stairs  3 4   Bath  4 2   Dress  4 2   Shop  2 1     05/04/16 0804 05/04/16 0806     ADL [facility]   ADL Phase -- Exit    SOB Score total  42    Rest  0    Walk  1    Stairs  3    Bath  2    Dress  3    Shop  1       Initial Exercise Prescription:     Initial Exercise Prescription - 01/27/16 1300    Date of Initial Exercise RX and Referring Provider   Date 01/27/16   Treadmill   MPH 2   Grade 0   Minutes 10  Use intervals if necessary such as 2, 5 min intervals   Bike   Level 0.4   Watts 12   Minutes 10   Recumbant Bike   Level 3   RPM 40   Watts 25   Minutes 10   NuStep   Level 2   Watts 25   Minutes 10   Arm Ergometer   Level 1   Watts 8   Minutes 10   Arm/Foot Ergometer   Level 4   Watts 12   Minutes 10   Cybex   Level 2   RPM 50   Minutes 10   Recumbant Elliptical   Level 1   RPM 35   Watts 10   Minutes 10   REL-XR   Level 2   Watts 35   Minutes 10   T5 Nustep   Level 1   Watts 10   Minutes 10   Biostep-RELP   Level 2   Watts 15   Minutes 10   Prescription Details   Frequency (times per week) 3   Duration Progress to 30 minutes of continuous aerobic without signs/symptoms of physical distress   Intensity   THRR REST +  30   Ratings of Perceived Exertion 11-13   Perceived Dyspnea 2-4   Progression   Progression Continue progressive overload as per policy without signs/symptoms or physical distress.   Resistance Training   Training Prescription Yes   Weight 2   Reps 10-12      Discharge Exercise Prescription (Final Exercise Prescription Changes):     Exercise Prescription Changes - 05/04/16 0800    Exercise Review   Progression Yes   Response to Exercise   Blood Pressure (Admit) 112/64 mmHg  Data from 05/02/2016   Blood Pressure (Exercise) 132/70 mmHg  Blood Pressure (Exit) 128/60 mmHg   Heart Rate (Admit) 95 bpm   Heart Rate (Exercise) 112 bpm   Heart Rate (Exit) 102 bpm   Oxygen Saturation (Admit) 98 %   Oxygen Saturation (Exercise) 91 %   Oxygen Saturation (Exit) 97 %   Rating of Perceived Exertion (Exercise) 15   Perceived Dyspnea (Exercise) 4   Symptoms None   Intensity Rest + 30   Progression   Progression Continue to progress workloads to maintain intensity without signs/symptoms of physical distress.   Resistance Training   Training Prescription Yes   Weight 4   Reps 10-12   Home Exercise Plan   Plans to continue exercise at [facility] (comment)   Frequency Add 3 additional days to program exercise sessions.  Plans to exercise at [place]:     6 Minute Walk      01/27/16 1331 03/14/16 1109 04/18/16 1205   6 Minute Walk   Phase Initial Mid Program Discharge   Distance 1245 feet 1300 feet 1390 feet   Distance % Change  4 %    Walk Time 6 minutes 6 minutes 6 minutes   # of Rest Breaks 0  0   MPH 2.4     RPE 12 13 11    Perceived Dyspnea  2 3 2    Symptoms Yes (comment) No No   Comments Light headed after walking, blurred vision, short of breath  no symtpoms reported. Patient stated that he felt pretty good during the walk    Resting HR 90 bpm 70 bpm 76 bpm   Resting BP 128/68 mmHg 124/72 mmHg 100/58 mmHg    Max Ex. HR 116 bpm 94 bpm 102 bpm   Max Ex. BP 150/60 mmHg 148/74 mmHg 142/82 mmHg      Psychological, QOL, Others - Outcomes: PHQ 2/9: Depression screen [facility] 2/9 05/04/2016 01/27/2016  Decreased Interest 0 1  Down, Depressed, Hopeless 0 1  PHQ - 2 Score 0 2  Altered sleeping 0 1  Tired, decreased energy 1 3  Change in appetite 0 1  Feeling bad or failure about yourself  0 0  Trouble concentrating 0 1  Moving slowly or fidgety/restless 0 1  Suicidal thoughts 0 0  PHQ-9 Score 1 9  Difficult doing work/chores Somewhat difficult Somewhat difficult    Quality of Life:     Quality of Life - 05/04/16 0807    Quality of Life Scores   Health/Function Pre 10.22 %   Health/Function Post 20.07 %   Health/Function % Change 96.38 %   Socioeconomic Pre 17.14 %   Socioeconomic Post 26.25 %   Socioeconomic % Change  53.15 %   Psych/Spiritual Pre 19.71 %   Psych/Spiritual Post 24 %   Psych/Spiritual % Change 21.77 %   Family Pre 24 %   Family Post 26.1 %   Family % Change 8.75 %   GLOBAL Pre 15.22 %   GLOBAL Post 23.13 %   GLOBAL % Change 51.97 %      Personal Goals: Goals established at orientation with interventions provided to work toward goal.     Personal Goals and Risk Factors at Admission - 01/27/16 1422    Core Components/Risk Factors/Patient Goals on Admission   Sedentary Yes  Mann utilizes the Treadmill and weight machine at his residence. He tries 3-5 days a week if he is feeling well. His goal is to stay active.  He has recently  had concerns with "dizziness" and he was advised to talk to his PMD about the symptoms.    Intervention (read-only) While in program, learn and follow the exercise prescription taught. Start at a low level workload and increase workload after able to maintain previous level for 30 minutes. Increase time before increasing intensity.   Improve shortness of breath with ADL's Yes  Indy scored some fear with SOB, he stated he has SOB with exertion,  that has learned to space his activities to be less SOB   Intervention (read-only) While in program, learn and follow the exercise prescription taught. Start at a low level workload and increase workload ad advised by the exercise physiologist. Increase time before increasing intensity.   Develop more efficient breathing techniques such as purse lipped breathing and diaphragmatic breathing; and practicing self-pacing with activity Yes  Davieon instructed on purse lipped breathing and he had great return demonstration.   Intervention (read-only) While in program, learn and utilize the specific breathing techniques taught to you. Continue to practice and use the techniques as needed.   Increase knowledge of respiratory medications and ability to use respiratory devices properly  Yes  Rashee has ventolin as needed, new nebuliser to use. Symbicort inhaler also.  Jonny did not have a spacer, will provide him with one and instruction for proper use next visit.   Intervention (read-only) While in program learn and demonstrate appropriate use of your oxygen therapy by increasing flow with exertion, manage oxygen tank operation, including continuous and intermittent flow.  Understanding oxygen is a drug ordered by your physician.   Hypertension Yes   Goal Participant will see blood pressure controlled within the values of 140/45mm/Hg or within value directed by their physician.   Intervention (read-only) Provide nutrition & aerobic exercise along with prescribed medications to achieve BP 140/90 or less.   Lipids Yes   Goal Cholesterol controlled with medications as prescribed, with individualized exercise RX and with personalized nutrition plan. Value goals: LDL < 70mg , HDL > 40mg . Participant states understanding of desired cholesterol values and following prescriptions.   Intervention (read-only) Provide nutrition & aerobic exercise along with prescribed medications to achieve LDL 70mg , HDL >40mg .   Personal Goal  Other Yes   Personal Goal Stay Active   Intervention Follow exercise prescription guidelines to gradually increase activity levels without increase in SOB.    Understand more about Heart/Pulmonary Disease. Yes  Adain came to the program with a diagnosis of Asthma and Sleep apnea.  Johnwesley voiced interest in learning more about the disease process and what he can do to keep it controlled.      Intervention While in program utilize professionals for any questions, and attend the education sessions. Great websites to use are www.americanheart.org or www.lung.org for reliable information.  Dionisio has stoppped using the CPAP,because he tried every method available to use the machine and he did not tolerate any method.       Personal Goals Discharge:     Goals and Risk Factor Review      02/03/16 1000 02/22/16 1000 02/29/16 1000 03/23/16 1624 04/13/16 1149   Core Components/Risk Factors/Patient Goals Review   Personal Goals Review Develop more efficient breathing techniques such as purse lipped breathing and diaphragmatic breathing and practicing self-pacing with activity. Sedentary;Improve shortness of breath with ADL's;Increase knowledge of respiratory medications and ability to use respiratory devices properly.;Develop more efficient breathing techniques such as purse lipped breathing and diaphragmatic breathing and practicing self-pacing with activity. Increase knowledge of respiratory  medications and ability to use respiratory devices properly. Improve shortness of breath with ADL's;Increase knowledge of respiratory medications and ability to use respiratory devices properly.;Develop more efficient breathing techniques such as purse lipped breathing and diaphragmatic breathing and practicing self-pacing with activity. Sedentary   Review  Mr Vieth states he has more energy, although he still has increased shortness of breath; he is using PLB and does find that technique helpful; Gave Mr Messman a  [employer] training and education officer for his Ventolin and Symbicort Mr Caryn Section is experiencing horseness. We discussed that his Symbicort may be causing this, and at his appointment with Dr Raul Del tomorrow, I suggested he mention this to Dr Raul Del.   He could possibly order another inhaler  which would not cause this side effect. Mr Wege continues to use his MDI's as prescribed and his aerochamber with his albuterol and Symbicort; He manages his shortness of breath with PLB and pacing which he uses at the [facility] gym as well. Vandy stated today that coming to Pulmonary Rehab has helped improve his leg strength and his endurance is improved too!   Expected Outcomes  Continue to improve energy and stamina with his exercise goals; follow-up on aerochamber use.  Continue progressing with his exercise goals and learning management of his lung condition. Continue to work with the exercise prescription and progression ,looking for continued increase in strength and endurance   Breathing Techniques (read-only)   Goals Progress/Improvement seen  Yes       Comments Reviewed PLB with Mr Seminario and encouraged him to use this technique with his exercise goals.         04/20/16 1000           Core Components/Risk Factors/Patient Goals Review   Personal Goals Review Sedentary;Increase Strength and Stamina;Improve shortness of breath with ADL's;Develop more efficient breathing techniques such as purse lipped breathing and diaphragmatic breathing and practicing self-pacing with activity.;Increase knowledge of respiratory medications and ability to use respiratory devices properly.       Review Mr Trettel is close to graduating. He has attended regularly, participated in all athe education, and will apply his gained knowledge to managing his asthma daily.  He has increased his exercise goals and will continue exercise at the [facility] gym at least 3d/wk.Marland Kitchen He uses PLB  with his exercise and uses the technique for shortness of breath  during home activites. He is knowledgeable about his MDI's  and is compliant. He has improved his cough and is drinking more water to help control his cough.        Expected Outcomes Continue to exercise and be active at [facility] and continue managing his asthma.          Nutrition & Weight - Outcomes:     Pre Biometrics - 01/27/16 1330    Pre Biometrics   Height 5' 5.75" (1.67 m)   Weight 179 lb (81.194 kg)   Waist Circumference 39 inches   Hip Circumference 42.35 inches   Waist to Hip Ratio 0.92 %   BMI (Calculated) 29.2         Post Biometrics - 04/18/16 1208     Post  Biometrics   Height 5\' 6"  (1.676 m)   Weight 177 lb 1.6 oz (80.332 kg)   Waist Circumference 39.75 inches   Hip Circumference 41.25 inches   Waist to Hip Ratio 0.96 %   BMI (Calculated) 28.6      Nutrition:     Nutrition Therapy & Goals -  01/27/16 1416    Intervention Plan   Intervention (read-only) Using nutrition plan and personal goals to gain a healthy nutrition lifestyle. Add exercise as prescribed.      Nutrition Discharge:   Education Questionnaire Score:     Knowledge Questionnaire Score - 01/27/16 1403    Knowledge Questionnaire Score   Pre Score 6/10      Goals reviewed with patient; copy given to patient.

## 2016-05-04 NOTE — Addendum Note (Signed)
Addended by: Karie Fetch on: 05/04/2016 03:17 PM   Modules accepted: Orders

## 2016-05-04 NOTE — Patient Instructions (Signed)
Discharge Instructions  Patient Details  Name: Wesley Newton MRN: [id number] Date of Birth: [birth year] Referring Provider:  Erby Pian, MD   Number of Visits: 9  Reason for Discharge:  Patient reached a stable level of exercise. Patient independent in their exercise.  Smoking History:  History  Smoking status   Former Smoker  Smokeless tobacco   Not on file    Comment: quit 40 years    Diagnosis:  Asthma, chronic, unspecified asthma severity, uncomplicated - Plan: PULMONARY REHAB 30 DAY REVIEW  Sleep apnea - Plan: PULMONARY REHAB 30 DAY REVIEW  Initial Exercise Prescription:     Initial Exercise Prescription - 01/27/16 1300    Date of Initial Exercise RX and Referring Provider   Date 01/27/16   Treadmill   MPH 2   Grade 0   Minutes 10  Use intervals if necessary such as 2, 5 min intervals   Bike   Level 0.4   Watts 12   Minutes 10   Recumbant Bike   Level 3   RPM 40   Watts 25   Minutes 10   NuStep   Level 2   Watts 25   Minutes 10   Arm Ergometer   Level 1   Watts 8   Minutes 10   Arm/Foot Ergometer   Level 4   Watts 12   Minutes 10   Cybex   Level 2   RPM 50   Minutes 10   Recumbant Elliptical   Level 1   RPM 35   Watts 10   Minutes 10   REL-XR   Level 2   Watts 35   Minutes 10   T5 Nustep   Level 1   Watts 10   Minutes 10   Biostep-RELP   Level 2   Watts 15   Minutes 10   Prescription Details   Frequency (times per week) 3   Duration Progress to 30 minutes of continuous aerobic without signs/symptoms of physical distress   Intensity   THRR REST +  30   Ratings of Perceived Exertion 11-13   Perceived Dyspnea 2-4   Progression   Progression Continue progressive overload as per policy without signs/symptoms or physical distress.   Resistance Training   Training Prescription Yes   Weight 2   Reps 10-12      Discharge Exercise Prescription (Final Exercise Prescription Changes):     Exercise Prescription  Changes - 05/04/16 0800    Exercise Review   Progression Yes   Response to Exercise   Blood Pressure (Admit) 112/64 mmHg  Data from 05/02/2016   Blood Pressure (Exercise) 132/70 mmHg   Blood Pressure (Exit) 128/60 mmHg   Heart Rate (Admit) 95 bpm   Heart Rate (Exercise) 112 bpm   Heart Rate (Exit) 102 bpm   Oxygen Saturation (Admit) 98 %   Oxygen Saturation (Exercise) 91 %   Oxygen Saturation (Exit) 97 %   Rating of Perceived Exertion (Exercise) 15   Perceived Dyspnea (Exercise) 4   Symptoms None   Intensity Rest + 30   Progression   Progression Continue to progress workloads to maintain intensity without signs/symptoms of physical distress.   Resistance Training   Training Prescription Yes   Weight 4   Reps 10-12   Home Exercise Plan   Plans to continue exercise at [facility] (comment)   Frequency Add 3 additional days to program exercise sessions.  Plans to exercise at [facility]  Functional Capacity:     6 Minute Walk      01/27/16 1331 03/14/16 1109 04/18/16 1205   6 Minute Walk   Phase Initial Mid Program Discharge   Distance 1245 feet 1300 feet 1390 feet   Distance % Change  4 %    Walk Time 6 minutes 6 minutes 6 minutes   # of Rest Breaks 0  0   MPH 2.4     RPE 12 13 11    Perceived Dyspnea  2 3 2    Symptoms Yes (comment) No No   Comments Light headed after walking, blurred vision, short of breath  no symtpoms reported. Patient stated that he felt pretty good during the walk    Resting HR 90 bpm 70 bpm 76 bpm   Resting BP 128/68 mmHg 124/72 mmHg 100/58 mmHg   Max Ex. HR 116 bpm 94 bpm 102 bpm   Max Ex. BP 150/60 mmHg 148/74 mmHg 142/82 mmHg      Quality of Life:     Quality of Life - 05/04/16 0807    Quality of Life Scores   Health/Function Pre 10.22 %   Health/Function Post 20.07 %   Health/Function % Change 96.38 %   Socioeconomic Pre 17.14 %   Socioeconomic Post 26.25 %   Socioeconomic % Change  53.15 %   Psych/Spiritual Pre  19.71 %   Psych/Spiritual Post 24 %   Psych/Spiritual % Change 21.77 %   Family Pre 24 %   Family Post 26.1 %   Family % Change 8.75 %   GLOBAL Pre 15.22 %   GLOBAL Post 23.13 %   GLOBAL % Change 51.97 %      Personal Goals: Goals established at orientation with interventions provided to work toward goal.     Personal Goals and Risk Factors at Admission - 01/27/16 1422    Core Components/Risk Factors/Patient Goals on Admission   Sedentary Yes  Wesley Newton utilizes the Treadmill and weight machine at his residence. He tries 3-5 days a week if he is feeling well. His goal is to stay active.  He has recently had concerns with "dizziness" and he was advised to talk to his PMD about the symptoms.    Intervention (read-only) While in program, learn and follow the exercise prescription taught. Start at a low level workload and increase workload after able to maintain previous level for 30 minutes. Increase time before increasing intensity.   Improve shortness of breath with ADL's Yes  Wesley Newton scored some fear with SOB, he stated he has SOB with exertion, that has learned to space his activities to be less SOB   Intervention (read-only) While in program, learn and follow the exercise prescription taught. Start at a low level workload and increase workload ad advised by the exercise physiologist. Increase time before increasing intensity.   Develop more efficient breathing techniques such as purse lipped breathing and diaphragmatic breathing; and practicing self-pacing with activity Yes  Wesley Newton instructed on purse lipped breathing and he had great return demonstration.   Intervention (read-only) While in program, learn and utilize the specific breathing techniques taught to you. Continue to practice and use the techniques as needed.   Increase knowledge of respiratory medications and ability to use respiratory devices properly  Yes  Wesley Newton has ventolin as needed, new nebuliser to use. Symbicort inhaler also.   Wesley Newton did not have a spacer, will provide him with one and instruction for proper use next visit.   Intervention (read-only) While  in program learn and demonstrate appropriate use of your oxygen therapy by increasing flow with exertion, manage oxygen tank operation, including continuous and intermittent flow.  Understanding oxygen is a drug ordered by your physician.   Hypertension Yes   Goal Participant will see blood pressure controlled within the values of 140/45mm/Hg or within value directed by their physician.   Intervention (read-only) Provide nutrition & aerobic exercise along with prescribed medications to achieve BP 140/90 or less.   Lipids Yes   Goal Cholesterol controlled with medications as prescribed, with individualized exercise RX and with personalized nutrition plan. Value goals: LDL < 70mg , HDL > 40mg . Participant states understanding of desired cholesterol values and following prescriptions.   Intervention (read-only) Provide nutrition & aerobic exercise along with prescribed medications to achieve LDL 70mg , HDL >40mg .   Personal Goal Other Yes   Personal Goal Stay Active   Intervention Follow exercise prescription guidelines to gradually increase activity levels without increase in SOB.    Understand more about Heart/Pulmonary Disease. Yes  Tymothy came to the program with a diagnosis of Asthma and Sleep apnea.  Vasily voiced interest in learning more about the disease process and what he can do to keep it controlled.      Intervention While in program utilize professionals for any questions, and attend the education sessions. Great websites to use are www.americanheart.org or www.lung.org for reliable information.  Jerid has stoppped using the CPAP,because he tried every method available to use the machine and he did not tolerate any method.       Personal Goals Discharge:     Goals and Risk Factor Review - 04/20/16 1000    Core Components/Risk Factors/Patient Goals Review    Personal Goals Review Sedentary;Increase Strength and Stamina;Improve shortness of breath with ADL's;Develop more efficient breathing techniques such as purse lipped breathing and diaphragmatic breathing and practicing self-pacing with activity.;Increase knowledge of respiratory medications and ability to use respiratory devices properly.   Review Mr Beaulieu is close to graduating. He has attended regularly, participated in all athe education, and will apply his gained knowledge to managing his asthma daily.  He has increased his exercise goals and will continue exercise at the [facility] gym at least 3d/wk.Marland Kitchen He uses PLB  with his exercise and uses the technique for shortness of breath during home activites. He is knowledgeable about his MDI's  and is compliant. He has improved his cough and is drinking more water to help control his cough.    Expected Outcomes Continue to exercise and be active at [facility] and continue managing his asthma.      Nutrition & Weight - Outcomes:     Pre Biometrics - 01/27/16 1330    Pre Biometrics   Height 5' 5.75" (1.67 m)   Weight 179 lb (81.194 kg)   Waist Circumference 39 inches   Hip Circumference 42.35 inches   Waist to Hip Ratio 0.92 %   BMI (Calculated) 29.2         Post Biometrics - 04/18/16 1208     Post  Biometrics   Height 5\' 6"  (1.676 m)   Weight 177 lb 1.6 oz (80.332 kg)   Waist Circumference 39.75 inches   Hip Circumference 41.25 inches   Waist to Hip Ratio 0.96 %   BMI (Calculated) 28.6      Nutrition:     Nutrition Therapy & Goals - 01/27/16 1416    Intervention Plan   Intervention (read-only) Using nutrition plan and personal goals  to gain a healthy nutrition lifestyle. Add exercise as prescribed.      Nutrition Discharge:   Education Questionnaire Score:     Knowledge Questionnaire Score - 01/27/16 1403    Knowledge Questionnaire Score   Pre Score 6/10      Goals reviewed with patient; copy given to patient.

## 2016-05-31 ENCOUNTER — Other Ambulatory Visit: Payer: Self-pay | Admitting: Specialist

## 2016-05-31 DIAGNOSIS — J8410 Pulmonary fibrosis, unspecified: Secondary | ICD-10-CM

## 2016-06-08 ENCOUNTER — Ambulatory Visit
Admission: RE | Admit: 2016-06-08 | Discharge: 2016-06-08 | Disposition: A | Discharge status: Home / Self Care | Payer: Medicare Other | Source: Ambulatory Visit | Attending: Specialist | Admitting: Specialist

## 2016-06-08 DIAGNOSIS — J8410 Pulmonary fibrosis, unspecified: Secondary | ICD-10-CM | POA: Diagnosis not present

## 2016-06-08 DIAGNOSIS — I700 Atherosclerosis of aorta: Secondary | ICD-10-CM | POA: Insufficient documentation

## 2016-06-08 DIAGNOSIS — I2510 Atherosclerotic heart disease of native coronary artery without angina pectoris: Secondary | ICD-10-CM | POA: Insufficient documentation

## 2016-07-11 ENCOUNTER — Other Ambulatory Visit: Payer: Self-pay | Admitting: Urology

## 2016-07-11 DIAGNOSIS — R351 Nocturia: Secondary | ICD-10-CM

## 2016-12-01 ENCOUNTER — Emergency Department
Admission: EM | Admit: 2016-12-01 | Discharge: 2016-12-01 | Disposition: A | Discharge status: Home / Self Care | Payer: Medicare Other | Attending: Emergency Medicine | Admitting: Emergency Medicine

## 2016-12-01 ENCOUNTER — Emergency Department: Payer: Medicare Other

## 2016-12-01 ENCOUNTER — Encounter: Payer: Self-pay | Admitting: Emergency Medicine

## 2016-12-01 DIAGNOSIS — J449 Chronic obstructive pulmonary disease, unspecified: Secondary | ICD-10-CM | POA: Diagnosis not present

## 2016-12-01 DIAGNOSIS — Z7982 Long term (current) use of aspirin: Secondary | ICD-10-CM | POA: Diagnosis not present

## 2016-12-01 DIAGNOSIS — R05 Cough: Secondary | ICD-10-CM | POA: Insufficient documentation

## 2016-12-01 DIAGNOSIS — R0602 Shortness of breath: Secondary | ICD-10-CM | POA: Insufficient documentation

## 2016-12-01 DIAGNOSIS — Z87891 Personal history of nicotine dependence: Secondary | ICD-10-CM | POA: Insufficient documentation

## 2016-12-01 DIAGNOSIS — Z859 Personal history of malignant neoplasm, unspecified: Secondary | ICD-10-CM | POA: Diagnosis not present

## 2016-12-01 DIAGNOSIS — Z79899 Other long term (current) drug therapy: Secondary | ICD-10-CM | POA: Diagnosis not present

## 2016-12-01 DIAGNOSIS — R0600 Dyspnea, unspecified: Secondary | ICD-10-CM

## 2016-12-01 LAB — BASIC METABOLIC PANEL
ANION GAP: 7 (ref 5–15)
BUN: 23 mg/dL — ABNORMAL HIGH (ref 6–20)
CALCIUM: 9.7 mg/dL (ref 8.9–10.3)
CHLORIDE: 106 mmol/L (ref 101–111)
CO2: 26 mmol/L (ref 22–32)
CREATININE: 1.33 mg/dL — AB (ref 0.61–1.24)
GFR calc non Af Amer: 46 mL/min — ABNORMAL LOW (ref >60)
GFR, EST AFRICAN AMERICAN: 54 mL/min — AB (ref >60)
Glucose, Bld: 85 mg/dL (ref 65–99)
Potassium: 3.6 mmol/L (ref 3.5–5.1)
SODIUM: 139 mmol/L (ref 135–145)

## 2016-12-01 LAB — CBC WITH DIFFERENTIAL/PLATELET
BASOS ABS: 0 10*3/uL (ref 0–0.1)
BASOS PCT: 0 %
EOS ABS: 0 10*3/uL (ref 0–0.7)
Eosinophils Relative: 0 %
HEMATOCRIT: 42.3 % (ref 40.0–52.0)
HEMOGLOBIN: 14.1 g/dL (ref 13.0–18.0)
Lymphocytes Relative: 20 %
Lymphs Abs: 3.6 10*3/uL (ref 1.0–3.6)
MCH: 28.3 pg (ref 26.0–34.0)
MCHC: 33.4 g/dL (ref 32.0–36.0)
MCV: 84.9 fL (ref 80.0–100.0)
MONOS PCT: 9 %
Monocytes Absolute: 1.6 10*3/uL — ABNORMAL HIGH (ref 0.2–1.0)
NEUTROS ABS: 12.4 10*3/uL — AB (ref 1.4–6.5)
NEUTROS PCT: 71 %
Platelets: 282 10*3/uL (ref 150–440)
RBC: 4.99 MIL/uL (ref 4.40–5.90)
RDW: 15.1 % — ABNORMAL HIGH (ref 11.5–14.5)
WBC: 17.7 10*3/uL — AB (ref 3.8–10.6)

## 2016-12-01 LAB — TROPONIN I

## 2016-12-01 MED ORDER — IPRATROPIUM-ALBUTEROL 0.5-2.5 (3) MG/3ML IN SOLN
3.0000 mL | Freq: Once | RESPIRATORY_TRACT | Status: AC
  Administered 2016-12-01: 3 mL via RESPIRATORY_TRACT
  Filled 2016-12-01: qty 3

## 2016-12-01 MED ORDER — ALBUTEROL SULFATE (2.5 MG/3ML) 0.083% IN NEBU: 5.0000 mg | INHALATION_SOLUTION | Freq: Once | RESPIRATORY_TRACT | Status: DC

## 2016-12-01 NOTE — ED Provider Notes (Signed)
[facility] Emergency Department Provider Note ____________________________________________   I have reviewed the triage vital signs and the triage nursing note.  HISTORY  Chief Complaint Shortness of Breath   Historian Patient  HPI Wesley Newton is a 82 y.o. male with a history of COPD and pulmonary fibrosis presents today complaining about shortness of breath and cough over the past one week for which she saw Dr. Raul Del, pulmonology, a few days ago and was started on Levaquin, prednisone, and Tessalon Perles, and he is taking his nebulizer albuterol treatment several times per day. This morning he took his nebulizer as he typically does in the morning and near the end of the breathing treatment he felt very dyspneic and wanted to use it again. No chest pain, but chest tightness. This lasted sounds like maybe less than a half hour. Since then he's been coughing up a lot of phlegm.  No fever. No confusion altered mental status. No bloody sputum.    Past Medical History:  Diagnosis Date  . BPH (benign prostatic hyperplasia)   . Cancer ([place])   . COPD (chronic obstructive pulmonary disease) ([place])   . DVT of leg (deep venous thrombosis) ([place])   . Elevated PSA   . Emphysema lung ([place])   . Nocturia   . Pulmonary fibrosis [facility])     Patient Active Problem List   Diagnosis Date Noted  . BPH with obstruction/lower urinary tract symptoms 01/10/2016  . Nocturia 01/10/2016  . Benign prostatic hyperplasia with urinary obstruction 01/10/2016  . AB (asthmatic bronchitis) 12/17/2014  . Carotid artery disease ([place]) 12/04/2014  . Acid reflux 12/04/2014  . H/O respiratory system disease 12/04/2014  . H/O peptic ulcer 12/04/2014  . Hypercholesterolemia 12/04/2014  . Unstable angina pectoris ([place]) 12/04/2014  . Amnesia 12/04/2014  . Chronic obstructive pulmonary disease ([place]) 06/11/2014  . Cephalalgia 06/11/2014  . BP (high blood pressure) 06/11/2014    Past  Surgical History:  Procedure Laterality Date  . CATARACT EXTRACTION    . HERNIA REPAIR    . TONSILLECTOMY      Prior to Admission medications   Medication Sig Start Date End Date Taking? Authorizing Provider  albuterol (PROVENTIL HFA;VENTOLIN HFA) 108 (90 Base) MCG/ACT inhaler Inhale 2 puffs into the lungs every 6 (six) hours as needed for wheezing or shortness of breath.    Historical Provider, MD  albuterol (PROVENTIL) (2.5 MG/3ML) 0.083% nebulizer solution Take 2.5 mg by nebulization every 6 (six) hours as needed for wheezing or shortness of breath.    Historical Provider, MD  aspirin EC 81 MG tablet Take 81 mg by mouth daily.     Historical Provider, MD  budesonide-formoterol (SYMBICORT) 160-4.5 MCG/ACT inhaler Inhale 2 puffs into the lungs 2 (two) times daily.    Historical Provider, MD  finasteride (PROSCAR) 5 MG tablet Take 1 tablet (5 mg total) by mouth daily. 04/26/16   Larene Beach A McGowan, PA-C  fluticasone (FLONASE) 50 MCG/ACT nasal spray Place into the nose. 01/08/16 01/07/17  Historical Provider, MD  ipratropium-albuterol (DUONEB) 0.5-2.5 (3) MG/3ML SOLN Inhale into the lungs. 01/04/16 12/29/16  Historical Provider, MD  loratadine (ALLERGY) 10 MG dissolvable tablet Take by mouth. 01/08/16 01/07/17  Historical Provider, MD  Multiple Vitamins-Minerals (PRESERVISION AREDS 2 PO) Take 1 capsule by mouth 2 (two) times daily.    Historical Provider, MD  naproxen (NAPROSYN) 375 MG tablet Take 375 mg by mouth 3 (three) times daily as needed for mild pain.     Historical Provider, MD  omeprazole (PRILOSEC) 40 MG capsule Take 40 mg by mouth daily.     Historical Provider, MD  simvastatin (ZOCOR) 40 MG tablet Take 40 mg by mouth at bedtime.     Historical Provider, MD  tamsulosin (FLOMAX) 0.4 MG CAPS capsule TAKE 1 CAPSULE BY MOUTH ONCE DAILY 07/11/16   [name], PA-C  vitamin B-12 (CYANOCOBALAMIN) 1000 MCG tablet Take 1,000 mcg by mouth daily.    Historical Provider, MD    Allergies   Allergen Reactions  . Codeine Nausea And Vomiting  . Rapaflo [Silodosin] Other (See Comments)    Reaction:  Unknown   . Tramadol Nausea And Vomiting and Other (See Comments)    Pt states that this medication makes him feel crazy.      Family History  Problem Relation Age of Onset  . Kidney disease Neg Hx   . Prostate cancer Neg Hx     Social History Social History  Substance Use Topics  . Smoking status: Former Research scientist (life sciences)  . Smokeless tobacco: Never Used     Comment: quit 40 years  . Alcohol use No    Review of Systems  Constitutional: Negative for fever. Eyes: Negative for visual changes. ENT: Negative for sore throat. Cardiovascular: Negative for Pleuritic chest pain. Respiratory: Positive for productive cough and episode of dyspnea, now resolved and feels well overall. Gastrointestinal: Negative for abdominal pain, vomiting and diarrhea. Genitourinary: Negative for dysuria. Musculoskeletal: Negative for back pain. Skin: Negative for rash. Neurological: Negative for headache. 10 point Review of Systems otherwise negative ____________________________________________   PHYSICAL EXAM:  VITAL SIGNS: ED Triage Vitals  Enc Vitals Group     BP 12/01/16 1157 125/78     Pulse Rate 12/01/16 1157 (!) 101     Resp 12/01/16 1157 18     Temp 12/01/16 1157 98.7 F (37.1 C)     Temp Source 12/01/16 1157 Oral     SpO2 12/01/16 1157 95 %     Weight 12/01/16 1200 177 lb (80.3 kg)     Height 12/01/16 1200 5\' 6"  (1.676 m)     Head Circumference --      Peak Flow --      Pain Score 12/01/16 1518 5     Pain Loc --      Pain Edu? --      Excl. in [place]? --      Constitutional: Alert and oriented. Well appearing and in no distress. HEENT   Head: Normocephalic and atraumatic.      Eyes: Conjunctivae are normal. PERRL. Normal extraocular movements.      Ears:         Nose: No congestion/rhinnorhea.   Mouth/Throat: Mucous membranes are moist.   Neck: No  stridor. Cardiovascular/Chest: Normal rate, regular rhythm.  No murmurs, rubs, or gallops. Respiratory: Normal respiratory effort without tachypnea nor retractions. Breath sounds are clear and equal bilaterally. No wheezes/rales/rhonchi. Gastrointestinal: Soft. No distention, no guarding, no rebound. Nontender.    Genitourinary/rectal:Deferred Musculoskeletal: Nontender with normal range of motion in all extremities. No joint effusions.  No lower extremity tenderness.  No edema. Neurologic:  Normal speech and language. No gross or focal neurologic deficits are appreciated. Skin:  Skin is warm, dry and intact. No rash noted. Psychiatric: Mood and affect are normal. Speech and behavior are normal. Patient exhibits appropriate insight and judgment.   ____________________________________________  LABS (pertinent positives/negatives)  Labs Reviewed  CBC WITH DIFFERENTIAL/PLATELET - Abnormal; Notable for the following:  Result Value   WBC 17.7 (*)    RDW 15.1 (*)    Neutro Abs 12.4 (*)    Monocytes Absolute 1.6 (*)    All other components within normal limits  BASIC METABOLIC PANEL - Abnormal; Notable for the following:    BUN 23 (*)    Creatinine, Ser 1.33 (*)    GFR calc non Af Amer 46 (*)    GFR calc Af Amer 54 (*)    All other components within normal limits  TROPONIN I    ____________________________________________    EKG I, Lisa Roca, MD, the attending physician have personally viewed and interpreted all ECGs.  Heart rate around 100 bpm. Narrow distress. Normal axis. Normal sinus rhythm. Nonspecific ST and T-wave. ____________________________________________  RADIOLOGY All Xrays were viewed by me. Imaging interpreted by Radiologist.  Chest x-ray two-view:  IMPRESSION: No evidence of acute intracranial abnormality.  Pulmonary fibrosis.  __________________________________________  PROCEDURES  Procedure(s) performed: None  Critical Care performed:  None  ____________________________________________   ED COURSE / ASSESSMENT AND PLAN  Pertinent labs & imaging results that were available during my care of the patient were reviewed by me and considered in my medical decision making (see chart for details).    Wesley Newton is overall well-appearing with no hypoxia or wheezing or shortness of breath on exam. It sounds like he was started on medications for COPD exacerbation about 2 days ago with his pulmonologist and reports he's had persistent symptoms since then although not significantly worsening. This morning had one episode where he was having trouble catching his breath but denies palpitations or dizziness. I did discuss with them the symptoms could've been related to a heart arrhythmia, patient did not necessarily reports symptoms highly concerning for that.  His EKG is reassuring today. His troponin is reassuring today. No ongoing symptoms, I have not recommended repeat troponin as symptoms were greater than 4 hours prior to his arrival here.  Chest x-ray is clear and without additional concern for his dyspneic episode this morning. Symptoms do not sound concerning for pulmonary embolism.  Ultimately, I am going to discharge her home tonight to continue the medication started for COPD exacerbation with his pulmonologist. I asked him to follow with his pulmonologist and/or primary care physician within one week.   CONSULTATIONS:   None   Patient / Family / Caregiver informed of clinical course, medical decision-making process, and agree with plan.   I discussed return precautions, follow-up instructions, and discharge instructions with patient and/or family.   ___________________________________________   FINAL CLINICAL IMPRESSION(S) / ED DIAGNOSES   Final diagnoses:  Dyspnea, unspecified type              Note: This dictation was prepared with Dragon dictation. Any transcriptional errors that result from this  process are unintentional    Lisa Roca, MD 12/01/16 1721

## 2016-12-01 NOTE — ED Notes (Signed)
No changes in symptoms

## 2016-12-01 NOTE — Discharge Instructions (Signed)
You were evaluated for an episode of shortness of breath this morning, and although no certain cause was found, your exam and evaluation are reassuring in the emergency department today.  As we discussed, please continue your current medications including prednisone, as well as her nebulizer, and a cough medicine.  Return to the emergency department for any worsening condition including trouble breathing, can't catch her breath, chest pain, fever, confusion or altered mental status, or any other symptoms concerning to you.

## 2016-12-01 NOTE — ED Triage Notes (Signed)
Patient states he has history of Asthma, Emphysema, and Pulmonary Fibrosis.  Seen by PCP earlier this week for C/O SOB and started on levoquin, prednisone, Benzonatate and vitamin C.  Patient states that this morning while using nebulizer, experienced difficulty breathing, used albuterol and symptoms improved.  Described secretions thickening today and was referred to ED by Dr. Vella Kohler.

## 2016-12-05 ENCOUNTER — Encounter: Payer: Self-pay | Admitting: Cardiovascular Disease

## 2016-12-05 ENCOUNTER — Ambulatory Visit (INDEPENDENT_AMBULATORY_CARE_PROVIDER_SITE_OTHER): Payer: Medicare Other | Admitting: Cardiovascular Disease

## 2016-12-05 VITALS — BP 124/60 | HR 92 | Ht 66.0 in | Wt 173.8 lb

## 2016-12-05 DIAGNOSIS — R0600 Dyspnea, unspecified: Secondary | ICD-10-CM | POA: Diagnosis not present

## 2016-12-05 DIAGNOSIS — E785 Hyperlipidemia, unspecified: Secondary | ICD-10-CM | POA: Diagnosis not present

## 2016-12-05 NOTE — Patient Instructions (Addendum)
Medication Instructions:  Your physician recommends that you continue on your current medications as directed. Please refer to the Current Medication list given to you today.   Labwork: none  Testing/Procedures: Your physician has requested that you have an echocardiogram. Echocardiography is a painless test that uses sound waves to create images of your heart. It provides your doctor with information about the size and shape of your heart and how well your heart's chambers and valves are working. This procedure takes approximately one hour. There are no restrictions for this procedure.    Follow-Up: Your physician recommends that you schedule a follow-up appointment as needed.    Any Other Special Instructions Will Be Listed Below (If Applicable).     If you need a refill on your cardiac medications before your next appointment, please call your pharmacy.  Echocardiogram An echocardiogram, or echocardiography, uses sound waves (ultrasound) to produce an image of your heart. The echocardiogram is simple, painless, obtained within a short period of time, and offers valuable information to your health care provider. The images from an echocardiogram can provide information such as:  Evidence of coronary artery disease (CAD).  Heart size.  Heart muscle function.  Heart valve function.  Aneurysm detection.  Evidence of a past heart attack.  Fluid buildup around the heart.  Heart muscle thickening.  Assess heart valve function.  Tell a health care provider about:  Any allergies you have.  All medicines you are taking, including vitamins, herbs, eye drops, creams, and over-the-counter medicines.  Any problems you or family members have had with anesthetic medicines.  Any blood disorders you have.  Any surgeries you have had.  Any medical conditions you have.  Whether you are pregnant or may be pregnant. What happens before the procedure? No special preparation is  needed. Eat and drink normally. What happens during the procedure?  In order to produce an image of your heart, gel will be applied to your chest and a wand-like tool (transducer) will be moved over your chest. The gel will help transmit the sound waves from the transducer. The sound waves will harmlessly bounce off your heart to allow the heart images to be captured in real-time motion. These images will then be recorded.  You may need an IV to receive a medicine that improves the quality of the pictures. What happens after the procedure? You may return to your normal schedule including diet, activities, and medicines, unless your health care provider tells you otherwise. This information is not intended to replace advice given to you by your health care provider. Make sure you discuss any questions you have with your health care provider. Document Released: 12/02/2000 Document Revised: 07/23/2016 Document Reviewed: 08/12/2013 Elsevier Interactive Patient Education  2017 [employer]  

## 2016-12-05 NOTE — Progress Notes (Signed)
Cardiology Office Note   Date:  12/05/2016   ID:  Wesley Newton, DOB [birth year], MRN [id number]  PCP:  Madelyn Brunner, MD  Cardiologist:   Kathlyn Sacramento, MD   Chief Complaint  Patient presents with  . other    F/u ED c/o fullness of rib cage and sob. Meds reviewed verbally with pt.      History of Present Illness: Wesley Newton is a 82 y.o. male who Was referred by Dr. Reita Cliche at [facility] emergency room for evaluation of shortness of breath. He has no previous cardiac history. He has known history of pulmonary fibrosis which was diagnosed about 4 years ago. He is being managed by Dr. Raul Del. Other chronic medical conditions include BPH and hyperlipidemia. No previous cardiac history. The patient has been having worsening shortness of breath and cough which is mostly dry. This has worsened gradually over the last few weeks in spite of inhalers and antibiotic. He went to the emergency room recently. BNP was only 150. Troponin was normal. Chest x-ray showed no evidence of volume overload but there was evidence of pulmonary fibrosis. Previous CT scan of the chest showed evidence of diffuse atherosclerosis. He is known to have carotid disease being managed medically and followed by Dr. Lucky Cowboy. He denies any chest pain, orthopnea or PND. He has no family history of premature coronary artery disease.     Past Medical History:  Diagnosis Date  . BPH (benign prostatic hyperplasia)   . Cancer ([place])   . COPD (chronic obstructive pulmonary disease) ([place])   . DVT of leg (deep venous thrombosis) ([place])   . Elevated PSA   . Emphysema lung ([place])   . Hyperlipidemia   . Nocturia   . Pulmonary fibrosis ([place])     Past Surgical History:  Procedure Laterality Date  . CATARACT EXTRACTION    . HERNIA REPAIR    . TONSILLECTOMY       Current Outpatient Prescriptions  Medication Sig Dispense Refill  . albuterol (PROVENTIL HFA;VENTOLIN HFA) 108 (90 Base) MCG/ACT inhaler Inhale 2 puffs into  the lungs every 6 (six) hours as needed for wheezing or shortness of breath.    Marland Kitchen albuterol (PROVENTIL) (2.5 MG/3ML) 0.083% nebulizer solution Take 2.5 mg by nebulization every 6 (six) hours as needed for wheezing or shortness of breath.    Marland Kitchen aspirin EC 81 MG tablet Take 81 mg by mouth daily.     . benzonatate (TESSALON) 100 MG capsule Take 100 mg by mouth 3 (three) times daily as needed for cough.    . budesonide-formoterol (SYMBICORT) 160-4.5 MCG/ACT inhaler Inhale 2 puffs into the lungs 2 (two) times daily.    . finasteride (PROSCAR) 5 MG tablet Take 1 tablet (5 mg total) by mouth daily. 30 tablet 3  . fluticasone (FLONASE) 50 MCG/ACT nasal spray Place into the nose.    Marland Kitchen ipratropium-albuterol (DUONEB) 0.5-2.5 (3) MG/3ML SOLN Inhale into the lungs.    Marland Kitchen LEVOFLOXACIN PO Take by mouth daily.    . Multiple Vitamins-Minerals (PRESERVISION AREDS 2 PO) Take 1 capsule by mouth 2 (two) times daily.    . naproxen (NAPROSYN) 375 MG tablet Take 375 mg by mouth 3 (three) times daily as needed for mild pain.   2  . omeprazole (PRILOSEC) 40 MG capsule Take 40 mg by mouth daily.   4  . predniSONE (STERAPRED UNI-PAK 21 TAB) 10 MG (21) TBPK tablet Take 10 mg by mouth as directed.    Marland Kitchen  simvastatin (ZOCOR) 40 MG tablet Take 40 mg by mouth at bedtime.   0  . tamsulosin (FLOMAX) 0.4 MG CAPS capsule TAKE 1 CAPSULE BY MOUTH ONCE DAILY 30 capsule 12   No current facility-administered medications for this visit.     Allergies:   Codeine; Rapaflo [silodosin]; and Tramadol    Social History:  The patient  reports that he has quit smoking. He has never used smokeless tobacco. He reports that he does not drink alcohol or use drugs.   Family History:  The patient's family history includes Heart Problems in his brother.    ROS:  Please see the history of present illness.   Otherwise, review of systems are positive for none.   All other systems are reviewed and negative.    PHYSICAL EXAM: VS:  BP 124/60 (BP  Location: Right Arm, Patient Position: Sitting, Cuff Size: Normal)   Pulse 92   Ht 5\' 6"  (1.676 m)   Wt 173 lb 12 oz (78.8 kg)   BMI 28.04 kg/m  , BMI Body mass index is 28.04 kg/m. GEN: Well nourished, well developed, in no acute distress  HEENT: normal  Neck: no JVD, carotid bruits, or masses Cardiac: RRR; no murmurs, rubs, or gallops,no edema  Respiratory:  clear to auscultation bilaterally Mildly diminished breath sounds, normal work of breathing GI: soft, nontender, nondistended, + BS MS: no deformity or atrophy  Skin: warm and dry, no rash Neuro:  Strength and sensation are intact Psych: euthymic mood, full affect   EKG:  EKG is ordered today. The ekg ordered today demonstrates normal sinus rhythm with borderline LVH.  Recent Labs: 01/05/2016: B Natriuretic Peptide 150.0 12/01/2016: Wesley 23; Creatinine, Ser 1.33; Hemoglobin 14.1; Platelets 282; Potassium 3.6; Sodium 139    Lipid Panel No results found for: CHOL, TRIG, HDL, CHOLHDL, VLDL, LDLCALC, LDLDIRECT    Wt Readings from Last 3 Encounters:  12/05/16 173 lb 12 oz (78.8 kg)  12/01/16 177 lb (80.3 kg)  04/18/16 177 lb 1.6 oz (80.3 kg)        PAD Screen 12/05/2016  Previous PAD dx? Yes  Previous surgical procedure? Yes  Dates of procedures Right leg DVT  Pain with walking? No  Subsides with rest? No  Feet/toe relief with dangling? Yes  Painful, non-healing ulcers? No  Extremities discolored? Yes      ASSESSMENT AND PLAN:  1.  Dyspnea: Likely due to pulmonary fibrosis and possible superimposed bronchitis. I don't see convincing evidence of volume overload. I reviewed his recent chest x-ray and also his BNP was close to normal. I requested an echocardiogram to evaluate LV systolic function and pulmonary pressure. Otherwise he should continue to follow-up with Dr. Raul Del. I do not recommend stress testing given the lack of clear anginal symptoms.   Disposition:   FU with me as needed.    Signed,  Kathlyn Sacramento, MD  12/05/2016 2:13 PM    [facility]

## 2016-12-28 ENCOUNTER — Ambulatory Visit (INDEPENDENT_AMBULATORY_CARE_PROVIDER_SITE_OTHER): Payer: Medicare Other

## 2016-12-28 ENCOUNTER — Other Ambulatory Visit: Payer: Self-pay

## 2016-12-28 DIAGNOSIS — R0600 Dyspnea, unspecified: Secondary | ICD-10-CM

## 2017-01-27 ENCOUNTER — Emergency Department: Payer: Medicare Other

## 2017-01-27 ENCOUNTER — Emergency Department
Admission: EM | Admit: 2017-01-27 | Discharge: 2017-01-27 | Disposition: A | Discharge status: Home / Self Care | Payer: Medicare Other | Attending: Emergency Medicine | Admitting: Emergency Medicine

## 2017-01-27 DIAGNOSIS — J069 Acute upper respiratory infection, unspecified: Secondary | ICD-10-CM | POA: Diagnosis not present

## 2017-01-27 DIAGNOSIS — Z859 Personal history of malignant neoplasm, unspecified: Secondary | ICD-10-CM | POA: Insufficient documentation

## 2017-01-27 DIAGNOSIS — Z7982 Long term (current) use of aspirin: Secondary | ICD-10-CM | POA: Insufficient documentation

## 2017-01-27 DIAGNOSIS — Z79899 Other long term (current) drug therapy: Secondary | ICD-10-CM | POA: Diagnosis not present

## 2017-01-27 DIAGNOSIS — E860 Dehydration: Secondary | ICD-10-CM | POA: Diagnosis not present

## 2017-01-27 DIAGNOSIS — J40 Bronchitis, not specified as acute or chronic: Secondary | ICD-10-CM

## 2017-01-27 DIAGNOSIS — J449 Chronic obstructive pulmonary disease, unspecified: Secondary | ICD-10-CM | POA: Diagnosis not present

## 2017-01-27 DIAGNOSIS — R05 Cough: Secondary | ICD-10-CM | POA: Diagnosis present

## 2017-01-27 DIAGNOSIS — Z87891 Personal history of nicotine dependence: Secondary | ICD-10-CM | POA: Insufficient documentation

## 2017-01-27 LAB — CBC
HEMATOCRIT: 42.4 % (ref 40.0–52.0)
Hemoglobin: 14.3 g/dL (ref 13.0–18.0)
MCH: 28.8 pg (ref 26.0–34.0)
MCHC: 33.8 g/dL (ref 32.0–36.0)
MCV: 85.2 fL (ref 80.0–100.0)
PLATELETS: 277 10*3/uL (ref 150–440)
RBC: 4.97 MIL/uL (ref 4.40–5.90)
RDW: 14.5 % (ref 11.5–14.5)
WBC: 9.7 10*3/uL (ref 3.8–10.6)

## 2017-01-27 LAB — BASIC METABOLIC PANEL
ANION GAP: 8 (ref 5–15)
BUN: 15 mg/dL (ref 6–20)
CO2: 27 mmol/L (ref 22–32)
Calcium: 9.5 mg/dL (ref 8.9–10.3)
Chloride: 103 mmol/L (ref 101–111)
Creatinine, Ser: 1.32 mg/dL — ABNORMAL HIGH (ref 0.61–1.24)
GFR, EST AFRICAN AMERICAN: 54 mL/min — AB (ref >60)
GFR, EST NON AFRICAN AMERICAN: 46 mL/min — AB (ref >60)
GLUCOSE: 101 mg/dL — AB (ref 65–99)
POTASSIUM: 4 mmol/L (ref 3.5–5.1)
Sodium: 138 mmol/L (ref 135–145)

## 2017-01-27 LAB — FIBRIN DERIVATIVES D-DIMER (ARMC ONLY): FIBRIN DERIVATIVES D-DIMER (ARMC): 492 (ref 0–499)

## 2017-01-27 LAB — INFLUENZA PANEL BY PCR (TYPE A & B)
Influenza A By PCR: NEGATIVE
Influenza B By PCR: NEGATIVE

## 2017-01-27 LAB — TROPONIN I: Troponin I: <0.03 ng/mL (ref <0.03)

## 2017-01-27 MED ORDER — ACETAMINOPHEN 500 MG PO TABS
1000.0000 mg | ORAL_TABLET | ORAL | Status: AC
  Administered 2017-01-27: 1000 mg via ORAL
  Filled 2017-01-27: qty 2

## 2017-01-27 MED ORDER — IPRATROPIUM-ALBUTEROL 0.5-2.5 (3) MG/3ML IN SOLN
3.0000 mL | Freq: Once | RESPIRATORY_TRACT | Status: AC
  Administered 2017-01-27: 3 mL via RESPIRATORY_TRACT
  Filled 2017-01-27: qty 3

## 2017-01-27 MED ORDER — SODIUM CHLORIDE 0.9 % IV BOLUS (SEPSIS)
500.0000 mL | Freq: Once | INTRAVENOUS | Status: AC
  Administered 2017-01-27: 500 mL via INTRAVENOUS

## 2017-01-27 NOTE — ED Notes (Signed)
Patient transported to X-ray 

## 2017-01-27 NOTE — ED Triage Notes (Signed)
Pt here with cough congestion, sore throat, and increased weakness today, pt has taken 4 doses of levaquin and states that he was initially taking prednisone but had to stop because of the way he was feeling, pt states that he just feels so much worse

## 2017-01-27 NOTE — ED Provider Notes (Signed)
[facility] Emergency Department Provider Note   ____________________________________________   First MD Initiated Contact with Patient 01/27/17 1624     (approximate)  I have reviewed the triage vital signs and the nursing notes.   HISTORY  Chief Complaint Weakness   HPI Wesley Newton is a 83 y.o. male the history of COPD, denies a history of DVT but reports he had peripheral vascular disease cared for by Dr. do in the past, and pulmonary fibrosis  Patient about 5 days ago began developing a cough, occasional wheezing, and some shortness of breath with a runny nose. He was seen by Dr. Gilford Rile, and given Levaquin and prednisone. He is continue the Levaquin, but prednisone made him feel hyper, and at Dr. Thomes Dinning request he discontinued that  He has not had any further wheezing. He is had mild shortness of breath, most notably when he is up and walking. He's been feeling fatigued today at lunch, reports he feels a little bit dehydrated, is having some trouble with walking as he just feels very fatigued and generally tired.  Denies any chest pain. No shortness of breath at present, but felt short of breath earlier, and this was mostly relieved with a nebulizer at home  No nausea vomiting. No fever or chills   Past Medical History:  Diagnosis Date  . BPH (benign prostatic hyperplasia)   . Cancer ([place])   . COPD (chronic obstructive pulmonary disease) ([place])   . DVT of leg (deep venous thrombosis) ([place])   . Elevated PSA   . Emphysema lung ([place])   . Hyperlipidemia   . Nocturia   . Pulmonary fibrosis [facility])     Patient Active Problem List   Diagnosis Date Noted  . BPH with obstruction/lower urinary tract symptoms 01/10/2016  . Nocturia 01/10/2016  . Benign prostatic hyperplasia with urinary obstruction 01/10/2016  . AB (asthmatic bronchitis) 12/17/2014  . Carotid artery disease ([place]) 12/04/2014  . Acid reflux 12/04/2014  . H/O respiratory system  disease 12/04/2014  . H/O peptic ulcer 12/04/2014  . Hypercholesterolemia 12/04/2014  . Unstable angina pectoris ([place]) 12/04/2014  . Amnesia 12/04/2014  . Chronic obstructive pulmonary disease ([place]) 06/11/2014  . Cephalalgia 06/11/2014  . BP (high blood pressure) 06/11/2014    Past Surgical History:  Procedure Laterality Date  . CATARACT EXTRACTION    . HERNIA REPAIR    . TONSILLECTOMY      Prior to Admission medications   Medication Sig Start Date End Date Taking? Authorizing Provider  albuterol (PROVENTIL HFA;VENTOLIN HFA) 108 (90 Base) MCG/ACT inhaler Inhale 2 puffs into the lungs every 6 (six) hours as needed for wheezing or shortness of breath.    Historical Provider, MD  albuterol (PROVENTIL) (2.5 MG/3ML) 0.083% nebulizer solution Take 2.5 mg by nebulization every 6 (six) hours as needed for wheezing or shortness of breath.    Historical Provider, MD  aspirin EC 81 MG tablet Take 81 mg by mouth daily.     Historical Provider, MD  benzonatate (TESSALON) 100 MG capsule Take 100 mg by mouth 3 (three) times daily as needed for cough.    Historical Provider, MD  budesonide-formoterol (SYMBICORT) 160-4.5 MCG/ACT inhaler Inhale 2 puffs into the lungs 2 (two) times daily.    Historical Provider, MD  finasteride (PROSCAR) 5 MG tablet Take 1 tablet (5 mg total) by mouth daily. 04/26/16   Larene [place] A McGowan, PA-C  fluticasone (FLONASE) 50 MCG/ACT nasal spray Place into the nose. 01/08/16 01/07/17  Historical Provider,  MD  LEVOFLOXACIN PO Take by mouth daily.    Historical Provider, MD  Multiple Vitamins-Minerals (PRESERVISION AREDS 2 PO) Take 1 capsule by mouth 2 (two) times daily.    Historical Provider, MD  naproxen (NAPROSYN) 375 MG tablet Take 375 mg by mouth 3 (three) times daily as needed for mild pain.     Historical Provider, MD  omeprazole (PRILOSEC) 40 MG capsule Take 40 mg by mouth daily.     Historical Provider, MD  predniSONE (STERAPRED UNI-PAK 21 TAB) 10 MG (21) TBPK tablet Take 10  mg by mouth as directed.    Historical Provider, MD  simvastatin (ZOCOR) 40 MG tablet Take 40 mg by mouth at bedtime.     Historical Provider, MD  tamsulosin (FLOMAX) 0.4 MG CAPS capsule TAKE 1 CAPSULE BY MOUTH ONCE DAILY 07/11/16   [name], PA-C    Allergies Codeine; Rapaflo [silodosin]; and Tramadol  Family History  Problem Relation Age of Onset  . Heart Problems Brother   . Kidney disease Neg Hx   . Prostate cancer Neg Hx     Social History Social History  Substance Use Topics  . Smoking status: Former Research scientist (life sciences)  . Smokeless tobacco: Never Used     Comment: quit 40 years  . Alcohol use No    Review of Systems Constitutional: No fever/chills Eyes: No visual changes. ENT: No sore throat.Runny nose Cardiovascular: Denies chest pain. Respiratory: See history of present illness. Primarily dry nonproductive cough Gastrointestinal: No abdominal pain.  No nausea, no vomiting.  No diarrhea.  No constipation. Genitourinary: Negative for dysuria. Musculoskeletal: Negative for back pain. Skin: Negative for rash. Neurological: Negative for headaches, focal weakness or numbness.  10-point ROS otherwise negative.  ____________________________________________   PHYSICAL EXAM:  VITAL SIGNS: ED Triage Vitals  Enc Vitals Group     BP 01/27/17 1613 (!) 163/86     Pulse Rate 01/27/17 1613 93     Resp 01/27/17 1613 20     Temp 01/27/17 1613 98.4 F (36.9 C)     Temp Source 01/27/17 1613 Oral     SpO2 01/27/17 1613 98 %     Weight 01/27/17 1614 170 lb (77.1 kg)     Height 01/27/17 1614 5\' 6"  (1.676 m)     Head Circumference --      Peak Flow --      Pain Score 01/27/17 1614 3     Pain Loc --      Pain Edu? --      Excl. in [place]? --     Constitutional: Alert and oriented. Well appearing and in no acute distress. Eyes: Conjunctivae are normal. PERRL. EOMI. Head: Atraumatic. Nose: Minimal clear congestion/rhinnorhea. Mouth/Throat: Mucous membranes are moist.   Oropharynx slightly injected, tonsils surgically absent. No mass, stridor, edema. Neck: No stridor.   Cardiovascular: Normal rate, regular rhythm. Grossly normal heart sounds.  Good peripheral circulation. Respiratory: Normal respiratory effort.  No retractions. Lungs CTAB. Speaks in full and clear sentences Gastrointestinal: Soft and nontender. No distention.  Musculoskeletal: No lower extremity tenderness nor edema.  No joint effusions. No thigh tenderness Neurologic:  Normal speech and language. No gross focal neurologic deficits are appreciated. Skin:  Skin is warm, dry and intact. No rash noted. Psychiatric: Mood and affect are normal. Speech and behavior are normal.  ____________________________________________   LABS (all labs ordered are listed, but only abnormal results are displayed)  Labs Reviewed  BASIC METABOLIC PANEL - Abnormal; Notable for the following:  Result Value   Glucose, Bld 101 (*)    Creatinine, Ser 1.32 (*)    GFR calc non Af Amer 46 (*)    GFR calc Af Amer 54 (*)    All other components within normal limits  INFLUENZA PANEL BY PCR (TYPE A & B)  CBC  TROPONIN I  FIBRIN DERIVATIVES D-DIMER ([facility] ONLY)   ____________________________________________  EKG  ED ECG REPORT I, QUALE, MARK, the attending physician, personally viewed and interpreted this ECG.  Date: 01/27/2017 EKG Time: 1627 Rate: 80 Rhythm: normal sinus rhythm QRS Axis: normal Intervals: normal ST/T Wave abnormalities: normal Conduction Disturbances: none Narrative Interpretation: unremarkable  ____________________________________________  RADIOLOGY  Dg Chest 2 View  Result Date: 01/27/2017 CLINICAL DATA:  Cough, congestion, sore throat EXAM: CHEST  2 VIEW COMPARISON:  12/01/2016, 09/03/2015 FINDINGS: There is bilateral chronic interstitial thickening consistent with fibrosis. There is no new areas of focal consolidation. There is no pleural effusion or pneumothorax. The heart  and mediastinal contours are unremarkable. The osseous structures are unremarkable. IMPRESSION: No active cardiopulmonary disease. Electronically Signed   By: Kathreen Devoid   On: 01/27/2017 17:37   Denies any recent long trips, leg swelling, history of prior blood clots, recent hospitalizations or immobilization. Patient again denies ever having a deep vein thrombus or clot, reports he had surgery for peripheral vascular disease in the past.  ____________________________________________   PROCEDURES  Procedure(s) performed: None  Procedures  Critical Care performed: No  ____________________________________________   INITIAL IMPRESSION / ASSESSMENT AND PLAN / ED COURSE  Pertinent labs & imaging results that were available during my care of the patient were reviewed by me and considered in my medical decision making (see chart for details).  Patient presents with cough, dyspnea. Stable hemodynamics here. No chest pain. Appears low risk, by well's criteria for DVT and his d-dimer assistant exclusion. No hypoxia. No increased work of breathing. He test negative for flu, reassuring lab work as well as EKG.  Chest x-ray is clear  ----------------------------------------- 7:00 PM on 01/27/2017 -----------------------------------------  The patient reports feeling improved. Resting comfortably with clear lung sounds. Come flow the plan to be discharged back to [facility], where he will have staff continue to check in assist him.  Return precautions and treatment recommendations and follow-up discussed with the patient who is agreeable with the plan.       ____________________________________________   FINAL CLINICAL IMPRESSION(S) / ED DIAGNOSES  Final diagnoses:  Viral URI  Bronchitis  Dehydration      NEW MEDICATIONS STARTED DURING THIS VISIT:  New Prescriptions   No medications on file     Note:  This document was prepared using Dragon voice recognition software and  may include unintentional dictation errors.     Delman Kitten, MD 01/27/17 1901

## 2017-01-27 NOTE — Discharge Instructions (Signed)
You have been seen in the Emergency Department (ED) today for a likely viral illness.  Please drink plenty of clear fluids (water, Gatorade, chicken broth, etc).  Continue your course of Levaquin.  Please follow up with your doctor early this coming weak.  Continue your regular medications and breathing treatments.  Call your doctor or return to the Emergency Department (ED) if you are unable to tolerate fluids due to vomiting, have wheezing, increased shortness of breath, have worsening trouble breathing, become extremely tired or difficult to awaken, or if you develop any other symptoms that concern you.

## 2017-02-07 ENCOUNTER — Ambulatory Visit: Payer: Medicare Other | Admitting: Urology

## 2017-02-07 ENCOUNTER — Encounter: Payer: Self-pay | Admitting: Urology

## 2017-02-07 VITALS — BP 156/65 | HR 75 | Ht 66.0 in | Wt 171.3 lb

## 2017-02-07 DIAGNOSIS — R351 Nocturia: Secondary | ICD-10-CM | POA: Diagnosis not present

## 2017-02-07 DIAGNOSIS — N401 Enlarged prostate with lower urinary tract symptoms: Secondary | ICD-10-CM | POA: Diagnosis not present

## 2017-02-07 DIAGNOSIS — N138 Other obstructive and reflux uropathy: Secondary | ICD-10-CM | POA: Diagnosis not present

## 2017-02-07 MED ORDER — FINASTERIDE 5 MG PO TABS: 5.0000 mg | ORAL_TABLET | Freq: Every day | ORAL | 12 refills | Status: DC

## 2017-02-07 MED ORDER — TAMSULOSIN HCL 0.4 MG PO CAPS: 0.4000 mg | ORAL_CAPSULE | Freq: Every day | ORAL | 12 refills | Status: DC

## 2017-02-07 NOTE — Progress Notes (Signed)
10:35 AM   Wesley Newton 11-02-1927 [id number]  Referring provider: Madelyn Brunner, MD [facility] [place], [place] [address]  Chief Complaint  Patient presents with  . Benign Prostatic Hypertrophy    HPI: Patient is an 83 year old Caucasian male with a history of nocturia and BPH with LUTS who presents today for a one year follow up.    Nocturia Patient is experiencing nocturia up to 2 times nightly. He finds it very bothersome and interrupting to his sleep. He is suffering from pulmonary fibrosis. He has undergone a sleep study and was not found to have sleep apnea.  He was started on Myrbetriq 25 mg at his last appointment.  He saw no benefit from taking the medication.     BPH WITH LUTS His IPSS score today is 12, which is moderate lower urinary tract symptomatology.   He is mostly satisfied with his quality life due to his urinary symptoms.   His previous I PSS score was 11/2.  His complaints today are frequency, nocturia and intermittency.  He denies any dysuria, hematuria or suprapubic pain.   He currently taking tamsulosin and finasteride.   His has had prostate biopsies in the remote past and no cancer was identified.  His last PSA was 1.4 ng/mL on 12/2014.  He also denies any recent fevers, chills, nausea or vomiting.  He does not have a family history of PCa.      IPSS    Row Name 02/07/17 1000         International Prostate Symptom Score   How often have you had the sensation of not emptying your bladder? Less than 1 in 5     How often have you had to urinate less than every two hours? Less than half the time     How often have you found you stopped and started again several times when you urinated? About half the time     How often have you found it difficult to postpone urination? Less than 1 in 5 times     How often have you had a weak urinary stream? About half the time     How often have you had to strain to start urination?  Not at All     How many times did you typically get up at night to urinate? 2 Times     Total IPSS Score 12       Quality of Life due to urinary symptoms   If you were to spend the rest of your life with your urinary condition just the way it is now how would you feel about that? Mostly Satisfied        Score:  1-7 Mild 8-19 Moderate 20-35 Severe     PMH: Past Medical History:  Diagnosis Date  . BPH (benign prostatic hyperplasia)   . Cancer ([place])   . COPD (chronic obstructive pulmonary disease) ([place])   . DVT of leg (deep venous thrombosis) ([place])   . Elevated PSA   . Emphysema lung ([place])   . Hyperlipidemia   . Nocturia   . Pulmonary fibrosis ([place])   . Skin cancer     Surgical History: Past Surgical History:  Procedure Laterality Date  . CATARACT EXTRACTION    . HERNIA REPAIR    . TONSILLECTOMY      Home Medications:  Allergies as of 02/07/2017      Reactions   Codeine Nausea  And Vomiting   Rapaflo [silodosin] Other (See Comments)   Reaction:  Unknown    Tramadol Nausea And Vomiting, Other (See Comments)   Pt states that this medication makes him feel crazy.        Medication List       Accurate as of 02/07/17 10:35 AM. Always use your most recent med list.          albuterol 108 (90 Base) MCG/ACT inhaler Commonly known as:  PROVENTIL HFA;VENTOLIN HFA Inhale 2 puffs into the lungs every 6 (six) hours as needed for wheezing or shortness of breath.   albuterol (2.5 MG/3ML) 0.083% nebulizer solution Commonly known as:  PROVENTIL Take 2.5 mg by nebulization every 6 (six) hours as needed for wheezing or shortness of breath.   aspirin EC 81 MG tablet Take 81 mg by mouth daily.   benzonatate 100 MG capsule Commonly known as:  TESSALON Take 100 mg by mouth 3 (three) times daily as needed for cough.   finasteride 5 MG tablet Commonly known as:  PROSCAR Take 1 tablet (5 mg total) by mouth daily.   fluticasone 50 MCG/ACT nasal spray Commonly known as:   FLONASE Place into the nose.   LEVOFLOXACIN PO Take by mouth daily.   naproxen 375 MG tablet Commonly known as:  NAPROSYN Take 375 mg by mouth 3 (three) times daily as needed for mild pain.   omeprazole 40 MG capsule Commonly known as:  PRILOSEC Take 40 mg by mouth daily.   predniSONE 10 MG (21) Tbpk tablet Commonly known as:  STERAPRED UNI-PAK 21 TAB Take 10 mg by mouth as directed.   PRESERVISION AREDS 2 PO Take 1 capsule by mouth 2 (two) times daily.   simvastatin 40 MG tablet Commonly known as:  ZOCOR Take 40 mg by mouth at bedtime.   SYMBICORT 160-4.5 MCG/ACT inhaler Generic drug:  budesonide-formoterol Inhale 2 puffs into the lungs 2 (two) times daily.   tamsulosin 0.4 MG Caps capsule Commonly known as:  FLOMAX Take 1 capsule (0.4 mg total) by mouth daily.       Allergies:  Allergies  Allergen Reactions  . Codeine Nausea And Vomiting  . Rapaflo [Silodosin] Other (See Comments)    Reaction:  Unknown   . Tramadol Nausea And Vomiting and Other (See Comments)    Pt states that this medication makes him feel crazy.      Family History: Family History  Problem Relation Age of Onset  . Heart Problems Brother   . Kidney disease Neg Hx   . Prostate cancer Neg Hx     Social History:  reports that he has quit smoking. He has never used smokeless tobacco. He reports that he does not drink alcohol or use drugs.  ROS: UROLOGY Frequent Urination?: Yes Hard to postpone urination?: No Burning/pain with urination?: No Get up at night to urinate?: Yes Leakage of urine?: No Urine stream starts and stops?: Yes Trouble starting stream?: No Do you have to strain to urinate?: No Blood in urine?: No Urinary tract infection?: No Sexually transmitted disease?: No Injury to kidneys or bladder?: No Painful intercourse?: No Weak stream?: No Erection problems?: No Penile pain?: No  Gastrointestinal Nausea?: No Vomiting?: No Indigestion/heartburn?: No Diarrhea?:  No Constipation?: No  Constitutional Fever: No Night sweats?: No Weight loss?: No Fatigue?: No  Skin Skin rash/lesions?: No Itching?: No  Eyes Blurred vision?: No Double vision?: No  Ears/Nose/Throat Sore throat?: No Sinus problems?: No  Hematologic/Lymphatic Swollen glands?: No Easy  bruising?: No  Cardiovascular Leg swelling?: No Chest pain?: No  Respiratory Cough?: No Shortness of breath?: No  Endocrine Excessive thirst?: No  Musculoskeletal Back pain?: No Joint pain?: No  Neurological Headaches?: No Dizziness?: No  Psychologic Depression?: No Anxiety?: No  Physical Exam: BP (!) 156/65 (BP Location: Left Arm, Patient Position: Sitting, Cuff Size: Normal)   Pulse 75   Ht 5\' 6"  (1.676 m)   Wt 171 lb 4.8 oz (77.7 kg)   BMI 27.65 kg/m   Constitutional: Well nourished. Alert and oriented, No acute distress. HEENT: [place] AT, moist mucus membranes. Trachea midline, no masses. Cardiovascular: No clubbing, cyanosis, or edema. Respiratory: Normal respiratory effort, no increased work of breathing. GI: Abdomen is soft, non tender, non distended, no abdominal masses. Liver and spleen not palpable.  No hernias appreciated.  Stool sample for occult testing is not indicated.   GU: No CVA tenderness.  No bladder fullness or masses.  Patient with circumcised phallus.  Urethral meatus is patent.  No penile discharge. No penile lesions or rashes. Scrotum without lesions, cysts, rashes and/or edema.  Testicles are located scrotally bilaterally. No masses are appreciated in the testicles. Left and right epididymis are normal. Rectal: Patient with  normal sphincter tone. Anus and perineum without scarring or rashes. No rectal masses are appreciated. Prostate is approximately 45 grams, no nodules are appreciated. Seminal vesicles are normal. Skin: No rashes, bruises or suspicious lesions. Lymph: No cervical or inguinal adenopathy. Neurologic: Grossly intact, no focal  deficits, moving all 4 extremities. Psychiatric: Normal mood and affect.  Laboratory Data: Lab Results  Component Value Date   WBC 9.7 01/27/2017   HGB 14.3 01/27/2017   HCT 42.4 01/27/2017   MCV 85.2 01/27/2017   PLT 277 01/27/2017    Lab Results  Component Value Date   CREATININE 1.32 (H) 01/27/2017     Lab Results  Component Value Date   AST 25 08/02/2015   Lab Results  Component Value Date   ALT 16 (L) 08/02/2015    Assessment & Plan:    1. Nocturia  - patient does not have sleep apnea  - medications not effective  - manage conservatively  2.  BPH with LUTS  - IPSS score is 12/2, it is worsening  - Continue conservative management, avoiding bladder irritants and timed voiding's  - Continue tamsulosin 0.4 mg daily and finasteride 5 mg daily; refills given  - Cannot tolerate medication or medication failure, schedule cystoscopy  - RTC in 12 months for IPSS, PSA and exam    Return in about 1 year (around 02/07/2018) for IPSS and exam.  These notes generated with voice recognition software. I apologize for typographical errors.  Zara Council, [facility] [address] [place],  [address] [phone]

## 2017-04-18 ENCOUNTER — Other Ambulatory Visit: Payer: Self-pay | Admitting: Internal Medicine

## 2017-04-18 DIAGNOSIS — M5442 Lumbago with sciatica, left side: Secondary | ICD-10-CM

## 2017-04-24 ENCOUNTER — Other Ambulatory Visit (INDEPENDENT_AMBULATORY_CARE_PROVIDER_SITE_OTHER): Payer: Self-pay | Admitting: Vascular Surgery

## 2017-04-24 DIAGNOSIS — I6523 Occlusion and stenosis of bilateral carotid arteries: Secondary | ICD-10-CM

## 2017-04-24 DIAGNOSIS — I739 Peripheral vascular disease, unspecified: Secondary | ICD-10-CM

## 2017-04-25 ENCOUNTER — Ambulatory Visit (INDEPENDENT_AMBULATORY_CARE_PROVIDER_SITE_OTHER): Payer: Medicare Other | Admitting: Vascular Surgery

## 2017-04-25 ENCOUNTER — Encounter (INDEPENDENT_AMBULATORY_CARE_PROVIDER_SITE_OTHER): Payer: Self-pay | Admitting: Vascular Surgery

## 2017-04-25 ENCOUNTER — Ambulatory Visit (INDEPENDENT_AMBULATORY_CARE_PROVIDER_SITE_OTHER): Payer: Medicare Other

## 2017-04-25 VITALS — BP 151/72 | HR 68 | Resp 16 | Wt 167.0 lb

## 2017-04-25 DIAGNOSIS — I10 Essential (primary) hypertension: Secondary | ICD-10-CM | POA: Diagnosis not present

## 2017-04-25 DIAGNOSIS — I739 Peripheral vascular disease, unspecified: Secondary | ICD-10-CM

## 2017-04-25 DIAGNOSIS — E7800 Pure hypercholesterolemia, unspecified: Secondary | ICD-10-CM | POA: Diagnosis not present

## 2017-04-25 DIAGNOSIS — I779 Disorder of arteries and arterioles, unspecified: Secondary | ICD-10-CM | POA: Diagnosis not present

## 2017-04-25 DIAGNOSIS — I6523 Occlusion and stenosis of bilateral carotid arteries: Secondary | ICD-10-CM | POA: Diagnosis not present

## 2017-04-25 NOTE — Assessment & Plan Note (Signed)
His carotid duplex today demonstrates stable 1-39% right ICA stenosis and stable 40-59% left ICA stenosis without significant progression from his previous study. Remains asymptomatic. Continue aspirin and statin agent. Plan to recheck in 1 year.

## 2017-04-25 NOTE — Assessment & Plan Note (Signed)
blood pressure control important in reducing the progression of atherosclerotic disease. On appropriate oral medications.  

## 2017-04-25 NOTE — Progress Notes (Signed)
MRN : [id number]  Wesley Newton is a 83 y.o. (1927/11/29) male who presents with chief complaint of  Chief Complaint  Patient presents with  . Follow-up  .  History of Present Illness: Patient returns in follow-up of multiple vascular issues. He is in his usual state of health. He denies any major problems or issues since his last visit. He denies focal neurologic symptoms. His carotid duplex today demonstrates stable 1-39% right ICA stenosis and stable 40-59% left ICA stenosis without significant progression from his previous study. As for his peripheral arterial disease, he has some claudication symptoms although they are not lifestyle limiting. He denies rest pain or ulceration. His noninvasive study showed noncompressible vessels on the right with an ABI of greater than 1.3. His left ABI has dropped some to 0.85. He does have a left SFA occlusion on duplex as well as some right SFA stenoses and tibial disease present as well.  Current Outpatient Prescriptions  Medication Sig Dispense Refill  . albuterol (PROVENTIL HFA;VENTOLIN HFA) 108 (90 Base) MCG/ACT inhaler Inhale 2 puffs into the lungs every 6 (six) hours as needed for wheezing or shortness of breath.    Marland Kitchen albuterol (PROVENTIL) (2.5 MG/3ML) 0.083% nebulizer solution Take 2.5 mg by nebulization every 6 (six) hours as needed for wheezing or shortness of breath.    Marland Kitchen aspirin EC 81 MG tablet Take 81 mg by mouth daily.     Marland Kitchen azelastine (ASTELIN) 0.1 % nasal spray     . budesonide (PULMICORT) 0.5 MG/2ML nebulizer solution Inhale into the lungs.    . budesonide-formoterol (SYMBICORT) 160-4.5 MCG/ACT inhaler Inhale 2 puffs into the lungs 2 (two) times daily.    . finasteride (PROSCAR) 5 MG tablet Take 1 tablet (5 mg total) by mouth daily. 30 tablet 12  . gabapentin (NEURONTIN) 100 MG capsule Take 200 mg by mouth 2 (two) times daily.  0  . LEVOFLOXACIN PO Take by mouth daily.    Marland Kitchen LORazepam (ATIVAN) 0.5 MG tablet   1  . MAGNESIUM PO Take  by mouth.    . Multiple Vitamins-Minerals (PRESERVISION AREDS 2 PO) Take 1 capsule by mouth 2 (two) times daily.    . naproxen (NAPROSYN) 375 MG tablet Take 375 mg by mouth 3 (three) times daily as needed for mild pain.   2  . omeprazole (PRILOSEC) 40 MG capsule Take 40 mg by mouth daily.   4  . simvastatin (ZOCOR) 40 MG tablet Take 40 mg by mouth at bedtime.   0  . benzonatate (TESSALON) 100 MG capsule Take 100 mg by mouth 3 (three) times daily as needed for cough.    . fluticasone (FLONASE) 50 MCG/ACT nasal spray Place into the nose.    . predniSONE (STERAPRED UNI-PAK 21 TAB) 10 MG (21) TBPK tablet Take 10 mg by mouth as directed.    . tamsulosin (FLOMAX) 0.4 MG CAPS capsule Take 1 capsule (0.4 mg total) by mouth daily. (Patient not taking: Reported on 04/25/2017) 30 capsule 12   No current facility-administered medications for this visit.     Past Medical History:  Diagnosis Date  . BPH (benign prostatic hyperplasia)   . Cancer ([place])   . COPD (chronic obstructive pulmonary disease) ([place])   . DVT of leg (deep venous thrombosis) ([place])   . Elevated PSA   . Emphysema lung ([place])   . Hyperlipidemia   . Nocturia   . Pulmonary fibrosis ([place])   . Skin cancer     Past  Surgical History:  Procedure Laterality Date  . CATARACT EXTRACTION    . HERNIA REPAIR    . TONSILLECTOMY      Social History Social History  Substance Use Topics  . Smoking status: Former Research scientist (life sciences)  . Smokeless tobacco: Never Used     Comment: quit 40 years  . Alcohol use No     Family History Family History  Problem Relation Age of Onset  . Heart Problems Brother   . Kidney disease Neg Hx   . Prostate cancer Neg Hx      Allergies  Allergen Reactions  . Codeine Nausea And Vomiting  . Rapaflo [Silodosin] Other (See Comments)    Reaction:  Unknown   . Tramadol Nausea And Vomiting and Other (See Comments)    Pt states that this medication makes him feel crazy.       REVIEW OF SYSTEMS (Negative unless  checked)  Constitutional: [] Weight loss  [] Fever  [] Chills Cardiac: [] Chest pain   [] Chest pressure   [] Palpitations   [] Shortness of breath when laying flat   [] Shortness of breath at rest   [] Shortness of breath with exertion. Vascular:  [x] Pain in legs with walking   [] Pain in legs at rest   [] Pain in legs when laying flat   [x] Claudication   [] Pain in feet when walking  [] Pain in feet at rest  [] Pain in feet when laying flat   [] History of DVT   [] Phlebitis   [] Swelling in legs   [] Varicose veins   [] Non-healing ulcers Pulmonary:   [] Uses home oxygen   [] Productive cough   [] Hemoptysis   [] Wheeze  [] COPD   [] Asthma Neurologic:  [] Dizziness  [] Blackouts   [] Seizures   [] History of stroke   [] History of TIA  [] Aphasia   [] Temporary blindness   [] Dysphagia   [] Weakness or numbness in arms   [] Weakness or numbness in legs Musculoskeletal:  [x] Arthritis   [] Joint swelling   [] Joint pain   [] Low back pain Hematologic:  [] Easy bruising  [] Easy bleeding   [] Hypercoagulable state   [] Anemic  [] Hepatitis Gastrointestinal:  [] Blood in stool   [] Vomiting blood  [] Gastroesophageal reflux/heartburn   [] Difficulty swallowing. Genitourinary:  [] Chronic kidney disease   [] Difficult urination  [] Frequent urination  [] Burning with urination   [] Blood in urine Skin:  [] Rashes   [] Ulcers   [] Wounds Psychological:  [] History of anxiety   []  History of major depression.  Physical Examination  Vitals:   04/25/17 1149  BP: (!) 151/72  Pulse: 68  Resp: 16  Weight: 167 lb (75.8 kg)   Body mass index is 26.95 kg/m. Gen:  WD/WN, NAD. Appears younger than stated age Head: [place]/AT, No temporalis wasting. Ear/Nose/Throat: Hearing grossly intact, nares w/o erythema or drainage, trachea midline Eyes: Conjunctiva clear. Sclera non-icteric Neck: Supple.  No bruit or JVD.  Pulmonary:  Good air movement, equal and clear to auscultation bilaterally.  Cardiac: RRR, normal S1, S2, no Murmurs, rubs or gallops. Vascular:    Vessel Right Left  Radial Palpable Palpable  Ulnar Palpable Palpable  Brachial Palpable Palpable  Carotid Palpable, without bruit Palpable, without bruit  Aorta Not palpable N/A  Femoral Palpable Palpable  Popliteal Palpable Palpable  PT 1+ Palpable 1+ Palpable  DP Palpable 1+ Palpable    Musculoskeletal: M/S 5/5 throughout.  No deformity or atrophy.  Neurologic: CN 2-12 intact. Sensation grossly intact in extremities.  Symmetrical.  Speech is fluent. Motor exam as listed above. Psychiatric: Judgment intact, Mood & affect appropriate for pt's  clinical situation. Dermatologic: No rashes or ulcers noted.  No cellulitis or open wounds.      CBC Lab Results  Component Value Date   WBC 9.7 01/27/2017   HGB 14.3 01/27/2017   HCT 42.4 01/27/2017   MCV 85.2 01/27/2017   PLT 277 01/27/2017    BMET    Component Value Date/Time   NA 138 01/27/2017 1651   NA 138 12/12/2012 0835   K 4.0 01/27/2017 1651   K 4.0 12/12/2012 0835   CL 103 01/27/2017 1651   CL 105 12/12/2012 0835   CO2 27 01/27/2017 1651   CO2 26 12/12/2012 0835   GLUCOSE 101 (H) 01/27/2017 1651   GLUCOSE 112 (H) 12/12/2012 0835   BUN 15 01/27/2017 1651   BUN 17 12/12/2012 0835   CREATININE 1.32 (H) 01/27/2017 1651   CREATININE 1.26 12/12/2012 0835   CALCIUM 9.5 01/27/2017 1651   CALCIUM 9.4 12/12/2012 0835   GFRNONAA 46 (L) 01/27/2017 1651   GFRNONAA 52 (L) 12/12/2012 0835   GFRAA 54 (L) 01/27/2017 1651   GFRAA >60 12/12/2012 0835   CrCl cannot be calculated (Patient's most recent lab result is older than the maximum 21 days allowed.).  COAG Lab Results  Component Value Date   INR 0.98 09/03/2015    Radiology No results found.    Assessment/Plan Hypercholesterolemia lipid control important in reducing the progression of atherosclerotic disease. Continue statin therapy   BP (high blood pressure) blood pressure control important in reducing the progression of atherosclerotic disease. On  appropriate oral medications.   PAD (peripheral artery disease) (H[facility]) His noninvasive study showed noncompressible vessels on the right with an ABI of greater than 1.3. His left ABI has dropped some to 0.85. He does have a left SFA occlusion on duplex as well as some right SFA stenoses and tibial disease present as well. No lifestyle limiting symptoms. Continue aspirin and statin agent. Recheck in 1 year.  Carotid artery disease ([facility]) His carotid duplex today demonstrates stable 1-39% right ICA stenosis and stable 40-59% left ICA stenosis without significant progression from his previous study. Remains asymptomatic. Continue aspirin and statin agent. Plan to recheck in 1 year.    Leotis Pain, MD  04/25/2017 12:44 PM    This note was created with Dragon medical transcription system.  Any errors from dictation are purely unintentional

## 2017-04-25 NOTE — Assessment & Plan Note (Signed)
His noninvasive study showed noncompressible vessels on the right with an ABI of greater than 1.3. His left ABI has dropped some to 0.85. He does have a left SFA occlusion on duplex as well as some right SFA stenoses and tibial disease present as well. No lifestyle limiting symptoms. Continue aspirin and statin agent. Recheck in 1 year.

## 2017-04-25 NOTE — Assessment & Plan Note (Signed)
lipid control important in reducing the progression of atherosclerotic disease. Continue statin therapy  

## 2017-05-01 ENCOUNTER — Ambulatory Visit: Payer: Medicare Other

## 2017-05-02 ENCOUNTER — Ambulatory Visit
Admission: RE | Admit: 2017-05-02 | Discharge: 2017-05-02 | Disposition: A | Discharge status: Home / Self Care | Payer: Medicare Other | Source: Ambulatory Visit | Attending: Internal Medicine | Admitting: Internal Medicine

## 2017-05-02 DIAGNOSIS — M48061 Spinal stenosis, lumbar region without neurogenic claudication: Secondary | ICD-10-CM | POA: Insufficient documentation

## 2017-05-02 DIAGNOSIS — M5442 Lumbago with sciatica, left side: Secondary | ICD-10-CM | POA: Diagnosis not present

## 2017-05-02 DIAGNOSIS — M5126 Other intervertebral disc displacement, lumbar region: Secondary | ICD-10-CM | POA: Diagnosis not present

## 2017-05-02 DIAGNOSIS — M4316 Spondylolisthesis, lumbar region: Secondary | ICD-10-CM | POA: Insufficient documentation

## 2017-05-02 DIAGNOSIS — M4807 Spinal stenosis, lumbosacral region: Secondary | ICD-10-CM | POA: Insufficient documentation

## 2017-05-19 ENCOUNTER — Ambulatory Visit: Payer: Medicare Other | Attending: Neurological Surgery

## 2017-05-19 DIAGNOSIS — R42 Dizziness and giddiness: Secondary | ICD-10-CM

## 2017-05-19 DIAGNOSIS — G8929 Other chronic pain: Secondary | ICD-10-CM | POA: Diagnosis present

## 2017-05-19 DIAGNOSIS — R2689 Other abnormalities of gait and mobility: Secondary | ICD-10-CM

## 2017-05-19 DIAGNOSIS — M6281 Muscle weakness (generalized): Secondary | ICD-10-CM | POA: Diagnosis present

## 2017-05-19 DIAGNOSIS — M545 Low back pain: Secondary | ICD-10-CM | POA: Diagnosis present

## 2017-05-19 NOTE — Therapy (Signed)
[place] MAIN [facility] SERVICES [place], [place], [address] Phone: [phone]   Fax:  [phone]  Physical Therapy Evaluation  Patient Details  Name: Wesley Newton MRN: [id number] Date of Birth: Jul 12, 1927 Referring Provider: Era Bumpers MD  Encounter Date: 05/19/2017      PT End of Session - 05/19/17 1041    Visit Number 1   Number of Visits 10   Date for PT Re-Evaluation 05/25/17   Authorization - Visit Number 1   Authorization - Number of Visits 10   PT Start Time 0903   PT Stop Time 1008   PT Time Calculation (min) 65 min   Equipment Utilized During Treatment Gait belt   Activity Tolerance Patient tolerated treatment well;Patient limited by fatigue   Behavior During Therapy [facility] for tasks assessed/performed      Past Medical History:  Diagnosis Date  . BPH (benign prostatic hyperplasia)   . Cancer ([place])   . COPD (chronic obstructive pulmonary disease) ([place])   . DVT of leg (deep venous thrombosis) ([place])   . Elevated PSA   . Emphysema lung ([place])   . Hyperlipidemia   . Nocturia   . Pulmonary fibrosis ([place])   . Skin cancer     Past Surgical History:  Procedure Laterality Date  . CATARACT EXTRACTION    . HERNIA REPAIR    . TONSILLECTOMY      There were no vitals filed for this visit.       Subjective Assessment - 05/19/17 0920    Subjective Patient is a pleasant 83 year old male presenting to physical therapy for low back pain.    Pertinent History Pt. is a pleasant 83 year old male who was referred to physical therapy for lumbar DDD. He reports having back pain for years. Pt. reports that he has a history of bulging discs, had therapy before and the back pain went away. Recently (in 5+ years) patients coughing/emphysema started becoming worse and caused back pain to return. He had pulmonary therapy about 2 years ago. He states the pain is worse when trying to get up from a flat position. He can improve with  ambulation. He denies any worsening with flexion-extension movements. He states he has intermittent pain that radiates along his thighs and to his hips. He also complains of stiffness and pain in his shoulder elbow and left lateral rib cage. He states he has intermittent numbness in his thighs as well. He denies any loss of bowel bladder function. He has not had prior lumbar spine surgery. He has not had any epidural steroid injections. He reports that his legs are feeling weaker and that he had difficulty finishing shaving this morning due to weakness. He enjoys puzzles and currently lives in [place] since 2008.    Limitations Standing;Walking;House hold activities;Lifting   How long can you sit comfortably? n/a   How long can you stand comfortably? less than 8 minutes   How long can you walk comfortably? 8-15 minutes   Diagnostic tests slump test, knees to chest, 6 min walk, Berg   Patient Stated Goals decrease pain, improve walking   Currently in Pain? Yes   Pain Score 3    Pain Location Back   Pain Orientation Lower   Pain Descriptors / Indicators Aching;Spasm;Radiating   Pain Type Chronic pain   Pain Radiating Towards left leg   Pain Onset More than a month ago   Pain Frequency Constant   Aggravating  Factors  standing, walking, in the morning   Pain Relieving Factors sitting down   Effect of Pain on Daily Activities cannot clean his house anymore, has limited his walking ability.       PAIN: Now: 2-3/10, at worst 9-10/10 spasms, Transfers, twisting, extending pain provoking POSTURE: Kyphotic posture with trunk flexion. Slight left lean in seated.   PROM/AROM: Trunk Flexion Limited: pain when returning to standing  Trunk Extension To neutral (no extension) painful  Trunk R SB Limited painful  Trunk L SB limited  Trunk R rotation Limited painful  Trunk L rotation limited    STRENGTH:  Graded on a 0-5 scale Muscle Group Left Right  Hip Flex 4-/5 pain 4/5  Hip Abd 4-/5 4/5   Hip Add 4-/5 4/5  Hip Ext    Hip IR/ER 4-5/ 4/5  Knee Flex 3+/5 pain 4-/5  Knee Ext 4-/5 4/5  Ankle DF 4-/5 4/5  Ankle PF 4-/5 4/5   SENSATION: Sensation: normal Reflexes: normal  SPECIAL TESTS: Slump test: limited by limited hamstring flexibility  FUNCTIONAL MOBILITY: STS transfer: painful for pt. Increases pain by 2-3 points, unstable, requires frequent use of hands due to pain and dizziness. independent EOB <>supine: independent but painful  BALANCE: Limited, frequent LOB while ambulating however catches self and able to return to COM. Pt. Wants to be as independent as possible but may potentially need a cane in the future if balance is not resolved. Pt. C/o dizziness and frequently missteps when dizziness increases. Merrilee Jansky places patient at 80% risk for falls at this time due to limited ability to turn due to dizziness, limited single limb stance, and pain with transfers.   GAIT: L foot shuffle, foot flat contact, limited hip extension due to limited hamstring mobility, increased forward flexion of trunk, decreased arm swing. Occasional LOB/stumble. Dizziness when turning. OUTCOME MEASURES: TEST Outcome Interpretation  6 minute walk test          830      Feet 1000 feet is community Conservator, museum/gallery Assessment 45 <36/56 (100% risk for falls), 37-45 (80% risk for falls); 46-51 (>50% risk for falls); 52-55 (lower risk <25% of falls)  : FABQ: 28/30; 38/66, Total: 66/96 MODI: 48%, Severe Dis.  TherEx: Knees to chest stretch Knee rotation stretch painful-did not include into HEP Single knee to chest stretch  Trunk forward flexion with ball stretch STS transfer education and practice    .  Objective measurements completed on examination: See above findings.                  PT Education - 05/19/17 1041    Education provided Yes   Education Details HEP, importance of improving balance, education on back and transfers   Person(s) Educated Patient    Methods Explanation;Demonstration;Handout   Comprehension Verbalized understanding;Returned demonstration             PT Long Term Goals - 05/19/17 1053      PT LONG TERM GOAL #1   Title Patient will increase Berg Balance score by > 6 points (51) to demonstrate decreased fall risk during functional activities.   Baseline 05/19/17: 45   Time 6   Period Weeks   Status New     PT LONG TERM GOAL #2   Title Patient will increase six minute walk test distance to >1000 for progression to community ambulator and improve gait ability   Baseline 6/1: 830 ft   Time 6   Period Weeks  Status New     PT LONG TERM GOAL #3   Title Patient will reduce modified Oswestry score to <32 as to demonstrate decreased disability with ADLs including improved sleeping tolerance, walking/sitting tolerance etc for better mobility with ADLs.    Baseline 6/1: 48%: Severe disabiilty   Time 6   Period Weeks   Status New     PT LONG TERM GOAL #4   Title Patient will decrease FABQ score by 8 points (58/96) for decreased perception of disability and improved quality of life.    Baseline 6/1: 66/96   Time 6   Period Weeks   Status New     PT LONG TERM GOAL #5   Title Patient will be independent in home exercise program to improve strength/mobility for better functional independence with ADLs.   Baseline 6/1: given HEP   Time 6   Period Weeks   Status New                Plan - 05/19/17 1043    Clinical Impression Statement Patient is a pleasant 83 y.o. male who presents to physical therapy for lumbar DDD. His multilevel degenerative changes are throughout his lumbar spine with spondylosis ranging from L1 through L5. His neurologic exam did not show any problems, with intact reflexes and sensation. The patient's pain worsens with mobilization and transfers demonstrating degenerative changes and stenosis in his lumbar spine. Flexion decreases symptoms and knees to chest was pain relieving for  patient. Pt. has limited balance with frequent LOB while ambulating, however he is able to catch himself and return to COM. Pt. Wants to be as independent as possible but may potentially need a cane in the future if balance is not resolved. Pt. C/o dizziness and frequently missteps when dizziness increases. Merrilee Jansky places patient at 80% risk for falls at this time due to limited ability to turn due to dizziness, limited single limb stance, and pain with transfers.6 min walk test =830 ft, FABQ=66/96, MODI=48% (severe disability). Patient would benefit from a combined therapy approach of aquatic PT and land based PT due to his current presentation. Patient will benefit from skilled physical therapy services, aquatic and land based, to help control pain, improve mobility and safety while ambulating, and strengthen the patient to allow patient to return to prior level of function and interact safely with his natural environment.    History and Personal Factors relevant to plan of care: emphysema, hx of chronic back pain, memory and hearing difficulty, anxiety, depression   Clinical Presentation Stable   Clinical Presentation due to: This patient presents with 3, personal factors/ comorbidities, and 4  body elements including body structures and functions, activity limitations and or participation restrictions. Patient's condition is stable.   Clinical Decision Making Moderate   Rehab Potential Fair   Clinical Impairments Affecting Rehab Potential memory, chronicity   PT Frequency 1x / week   PT Duration 6 weeks   PT Treatment/Interventions ADLs/Self Care Home Management;Aquatic Therapy;Canalith Repostioning;Cryotherapy;Electrical Stimulation;Iontophoresis 4mg /ml Dexamethasone;Moist Heat;Traction;Ultrasound;DME Instruction;Gait training;Stair training;Balance training;Therapeutic exercise;Therapeutic activities;Functional mobility training;Neuromuscular re-education;Patient/family education;Manual  techniques;Passive range of motion;Vestibular;Energy conservation   PT Next Visit Plan aquatic   PT Home Exercise Plan see sheet   Recommended Other Services aquatic   Consulted and Agree with Plan of Care Patient      Patient will benefit from skilled therapeutic intervention in order to improve the following deficits and impairments:  Abnormal gait, Cardiopulmonary status limiting activity, Decreased activity tolerance, Decreased balance, Decreased coordination, Decreased  endurance, Decreased knowledge of precautions, Decreased safety awareness, Decreased knowledge of use of DME, Decreased mobility, Decreased strength, Difficulty walking, Dizziness, Impaired flexibility, Impaired perceived functional ability, Increased muscle spasms, Postural dysfunction, Improper body mechanics, Pain  Visit Diagnosis: Chronic bilateral low back pain, with sciatica presence unspecified - Plan: PT plan of care cert/re-cert  Other abnormalities of gait and mobility - Plan: PT plan of care cert/re-cert  Muscle weakness (generalized) - Plan: PT plan of care cert/re-cert  Dizziness and giddiness - Plan: PT plan of care cert/re-cert      G-Codes - 27/06/23 1104    Functional Assessment Tool Used (Outpatient Only) MODI, FABQ, 6 minutes walk test, BERG, clinical judgement   Functional Limitation Mobility: Walking and moving around   Mobility: Walking and Moving Around Current Status ([id number]) At least 60 percent but less than 80 percent impaired, limited or restricted   Mobility: Walking and Moving Around Goal Status [phone]) At least 40 percent but less than 60 percent impaired, limited or restricted       Problem List Patient Active Problem List   Diagnosis Date Noted  . PAD (peripheral artery disease) ([place]) 04/25/2017  . BPH with obstruction/lower urinary tract symptoms 01/10/2016  . Nocturia 01/10/2016  . Benign prostatic hyperplasia with urinary obstruction 01/10/2016  . AB (asthmatic bronchitis)  12/17/2014  . Carotid artery disease ([place]) 12/04/2014  . Acid reflux 12/04/2014  . H/O respiratory system disease 12/04/2014  . H/O peptic ulcer 12/04/2014  . Hypercholesterolemia 12/04/2014  . Unstable angina pectoris ([place]) 12/04/2014  . Amnesia 12/04/2014  . Chronic obstructive pulmonary disease ([place]) 06/11/2014  . Cephalalgia 06/11/2014  . BP (high blood pressure) 06/11/2014   Janna Arch, PT, DPT   05/19/2017, 11:07 AM  [place] MAIN [facility] SERVICES [address] [place], [place], [address] Phone: [phone]   Fax:  [phone]  Name: Wesley Newton MRN: [id number] Date of Birth: 04-08-1927

## 2017-05-25 ENCOUNTER — Ambulatory Visit: Payer: Medicare Other | Admitting: Physical Therapy

## 2017-05-29 ENCOUNTER — Ambulatory Visit: Payer: Medicare Other

## 2017-05-29 VITALS — BP 156/71 | HR 81

## 2017-05-29 DIAGNOSIS — R42 Dizziness and giddiness: Secondary | ICD-10-CM

## 2017-05-29 DIAGNOSIS — M545 Low back pain: Secondary | ICD-10-CM | POA: Diagnosis not present

## 2017-05-29 DIAGNOSIS — R2689 Other abnormalities of gait and mobility: Secondary | ICD-10-CM

## 2017-05-29 DIAGNOSIS — G8929 Other chronic pain: Secondary | ICD-10-CM

## 2017-05-29 DIAGNOSIS — M6281 Muscle weakness (generalized): Secondary | ICD-10-CM

## 2017-05-29 NOTE — Therapy (Signed)
[place] MAIN [facility] SERVICES [address] [place], [place], [address] Phone: [phone]   Fax:  [phone]  Physical Therapy Treatment  Patient Details  Name: Wesley Newton MRN: [id number] Date of Birth: 1927/03/04 Referring Provider: Era Bumpers MD  Encounter Date: 05/29/2017      PT End of Session - 05/29/17 0912    Visit Number 2   Number of Visits 10   Date for PT Re-Evaluation 06/16/17   Authorization - Visit Number 2   Authorization - Number of Visits 10   PT Start Time 0820   PT Stop Time 0907   PT Time Calculation (min) 47 min   Equipment Utilized During Treatment Gait belt   Activity Tolerance Patient tolerated treatment well;Patient limited by fatigue   Behavior During Therapy [facility] for tasks assessed/performed      Past Medical History:  Diagnosis Date  . BPH (benign prostatic hyperplasia)   . Cancer ([place])   . COPD (chronic obstructive pulmonary disease) ([place])   . DVT of leg (deep venous thrombosis) ([place])   . Elevated PSA   . Emphysema lung ([place])   . Hyperlipidemia   . Nocturia   . Pulmonary fibrosis ([place])   . Skin cancer     Past Surgical History:  Procedure Laterality Date  . CATARACT EXTRACTION    . HERNIA REPAIR    . TONSILLECTOMY      Vitals:   05/29/17 0825  BP: (!) 156/71  Pulse: 81  SpO2: 96%        Subjective Assessment - 05/29/17 0825    Subjective Pt. has had multiple LOB but reports no falls. However is having multiple periods of confusion.    Pertinent History Pt. is a pleasant 83 year old male who was referred to physical therapy for lumbar DDD. He reports having back pain for years. Pt. reports that he has a history of bulging discs, had therapy before and the back pain went away. Recently (in 5+ years) patients coughing/emphysema started becoming worse and caused back pain to return. He had pulmonary therapy about 2 years ago. He states the pain is worse when trying to get up from a flat  position. He can improve with ambulation. He denies any worsening with flexion-extension movements. He states he has intermittent pain that radiates along his thighs and to his hips. He also complains of stiffness and pain in his shoulder elbow and left lateral rib cage. He states he has intermittent numbness in his thighs as well. He denies any loss of bowel bladder function. He has not had prior lumbar spine surgery. He has not had any epidural steroid injections. He reports that his legs are feeling weaker and that he had difficulty finishing shaving this morning due to weakness. He enjoys puzzles and currently lives in [place] since 2008.    Limitations Standing;Walking;House hold activities;Lifting   How long can you sit comfortably? n/a   How long can you stand comfortably? less than 8 minutes   How long can you walk comfortably? 8-15 minutes   Diagnostic tests slump test, knees to chest, 6 min walk, Berg   Patient Stated Goals decrease pain, improve walking   Currently in Pain? Yes   Pain Score 3    Pain Location Back   Pain Orientation Distal   Pain Descriptors / Indicators Aching   Pain Type Chronic pain   Pain Onset More than a month ago   Pain Frequency Intermittent  Aggravating Factors  laying down, coughing     TherEx TA contraction with green swiss ball 2x10 Nustep Lvl4 seat position 8; 5 minutes  Side stepping in // bars 4x  Step up 6" step 12x, harder to do with the left side than the right Marches 20x  Sit to stands better with repetition  Neuro Re-ed Airex pad 3x 60 seconds, frequent trunk sway Standing balance without support Tandem stance 2x45 seconds  Laying down exacerbated cough, sitting up is better. Frequent rest breaks for oxygen levels to return to baseline due to frequent cough.           PT Long Term Goals - 05/19/17 1053      PT LONG TERM GOAL #1   Title Patient will increase Berg Balance score by > 6 points (51) to demonstrate decreased fall  risk during functional activities.   Baseline 05/19/17: 45   Time 6   Period Weeks   Status New     PT LONG TERM GOAL #2   Title Patient will increase six minute walk test distance to >1000 for progression to community ambulator and improve gait ability   Baseline 6/1: 830 ft   Time 6   Period Weeks   Status New     PT LONG TERM GOAL #3   Title Patient will reduce modified Oswestry score to <32 as to demonstrate decreased disability with ADLs including improved sleeping tolerance, walking/sitting tolerance etc for better mobility with ADLs.    Baseline 6/1: 48%: Severe disabiilty   Time 6   Period Weeks   Status New     PT LONG TERM GOAL #4   Title Patient will decrease FABQ score by 8 points (58/96) for decreased perception of disability and improved quality of life.    Baseline 6/1: 66/96   Time 6   Period Weeks   Status New     PT LONG TERM GOAL #5   Title Patient will be independent in home exercise program to improve strength/mobility for better functional independence with ADLs.   Baseline 6/1: given HEP   Time 6   Period Weeks   Status New               Plan - 05/29/17 0913    Clinical Impression Statement Patient presented to therapy early today due to confusion. Pt. Has had been more confusion lately. Cough is exacerbated today with talking and supine position. Nustep increased respiratory rate and rest breaks were required for return to baseline. Sit to stands caused increased dizziness and required pt. To sit back down and re check vitals. Vitals were  144/59 pulse 74 SP02: 95. LLE weakness more than RLE. Weakness affecting pt. Ability to ambulate and leading to postural changes that affect back pain. Patient will continue to benefit form skilled physical therapy to improve pain levels, improve mobility and safety while ambulating and to strengthen patient to allow for return to prior level of function and interact safelty with natural environment.   Rehab  Potential Fair   Clinical Impairments Affecting Rehab Potential memory, chronicity   PT Frequency 1x / week   PT Duration 6 weeks   PT Treatment/Interventions ADLs/Self Care Home Management;Aquatic Therapy;Canalith Repostioning;Cryotherapy;Electrical Stimulation;Iontophoresis 4mg /ml Dexamethasone;Moist Heat;Traction;Ultrasound;DME Instruction;Gait training;Stair training;Balance training;Therapeutic exercise;Therapeutic activities;Functional mobility training;Neuromuscular re-education;Patient/family education;Manual techniques;Passive range of motion;Vestibular;Energy conservation   PT Next Visit Plan progress balance and LE strengthening   PT Home Exercise Plan see sheet   Consulted and Agree with Plan of Care  Patient      Patient will benefit from skilled therapeutic intervention in order to improve the following deficits and impairments:  Abnormal gait, Cardiopulmonary status limiting activity, Decreased activity tolerance, Decreased balance, Decreased coordination, Decreased endurance, Decreased knowledge of precautions, Decreased safety awareness, Decreased knowledge of use of DME, Decreased mobility, Decreased strength, Difficulty walking, Dizziness, Impaired flexibility, Impaired perceived functional ability, Increased muscle spasms, Postural dysfunction, Improper body mechanics, Pain  Visit Diagnosis: Chronic bilateral low back pain, with sciatica presence unspecified  Other abnormalities of gait and mobility  Muscle weakness (generalized)  Dizziness and giddiness     Problem List Patient Active Problem List   Diagnosis Date Noted  . PAD (peripheral artery disease) ([place]) 04/25/2017  . BPH with obstruction/lower urinary tract symptoms 01/10/2016  . Nocturia 01/10/2016  . Benign prostatic hyperplasia with urinary obstruction 01/10/2016  . AB (asthmatic bronchitis) 12/17/2014  . Carotid artery disease ([place]) 12/04/2014  . Acid reflux 12/04/2014  . H/O respiratory system  disease 12/04/2014  . H/O peptic ulcer 12/04/2014  . Hypercholesterolemia 12/04/2014  . Unstable angina pectoris ([place]) 12/04/2014  . Amnesia 12/04/2014  . Chronic obstructive pulmonary disease ([place]) 06/11/2014  . Cephalalgia 06/11/2014  . BP (high blood pressure) 06/11/2014   Janna Arch, PT, DPT   05/29/2017, 9:15 AM  [place] MAIN [facility] SERVICES [address] [place], [place], [address] Phone: [phone]   Fax:  [phone]  Name: Wesley Newton MRN: [id number] Date of Birth: 09/07/1927

## 2017-06-01 ENCOUNTER — Ambulatory Visit: Payer: Medicare Other | Admitting: Physical Therapy

## 2017-06-02 ENCOUNTER — Ambulatory Visit: Payer: Medicare Other

## 2017-06-02 DIAGNOSIS — R2689 Other abnormalities of gait and mobility: Secondary | ICD-10-CM

## 2017-06-02 DIAGNOSIS — M545 Low back pain: Secondary | ICD-10-CM | POA: Diagnosis not present

## 2017-06-02 DIAGNOSIS — G8929 Other chronic pain: Secondary | ICD-10-CM

## 2017-06-02 DIAGNOSIS — M6281 Muscle weakness (generalized): Secondary | ICD-10-CM

## 2017-06-02 DIAGNOSIS — R42 Dizziness and giddiness: Secondary | ICD-10-CM

## 2017-06-02 NOTE — Therapy (Signed)
[place] MAIN [facility] SERVICES [address] [place], [place], [address] Phone: [phone]   Fax:  [phone]  Physical Therapy Treatment  Patient Details  Name: Wesley Newton MRN: [id number] Date of Birth: 05-Jul-1927 Referring Provider: Era Bumpers MD  Encounter Date: 06/02/2017      PT End of Session - 06/02/17 1141    Visit Number 3   Number of Visits 10   Date for PT Re-Evaluation 06/16/17   Authorization - Visit Number 3   Authorization - Number of Visits 10   PT Start Time 8938   PT Stop Time 1127   PT Time Calculation (min) 50 min   Equipment Utilized During Treatment Gait belt   Activity Tolerance Patient tolerated treatment well;Patient limited by fatigue   Behavior During Therapy [facility] for tasks assessed/performed      Past Medical History:  Diagnosis Date  . BPH (benign prostatic hyperplasia)   . Cancer ([place])   . COPD (chronic obstructive pulmonary disease) ([place])   . DVT of leg (deep venous thrombosis) ([place])   . Elevated PSA   . Emphysema lung ([place])   . Hyperlipidemia   . Nocturia   . Pulmonary fibrosis ([place])   . Skin cancer     Past Surgical History:  Procedure Laterality Date  . CATARACT EXTRACTION    . HERNIA REPAIR    . TONSILLECTOMY      There were no vitals filed for this visit. TherEx Purse lipped breathing in semi-reclined position Purse lipped breathing with shoulder rotations seated Purse lipped breathing arm circles seated Abduction side step // bars 12x each side Abduction side step with RTB // bars 12x each side Lateral step up 6" step throwing basketball on top of each step 12x each side Forward step up 6" step playing tic tac toe each step up 10x Sit to stand multiple trials, dizziness upon initial standing Purse lipped breathing multiple trials throughout session to regain control of breathing cycles.   Neuro Re-ed     Subjective Assessment - 06/02/17 1039    Subjective Pt. reports that  he does not remember any falls since last session but had ~3-4 stumbles where he caught himself since then.    Pertinent History Pt. is a pleasant 83 year old male who was referred to physical therapy for lumbar DDD. He reports having back pain for years. Pt. reports that he has a history of bulging discs, had therapy before and the back pain went away. Recently (in 5+ years) patients coughing/emphysema started becoming worse and caused back pain to return. He had pulmonary therapy about 2 years ago. He states the pain is worse when trying to get up from a flat position. He can improve with ambulation. He denies any worsening with flexion-extension movements. He states he has intermittent pain that radiates along his thighs and to his hips. He also complains of stiffness and pain in his shoulder elbow and left lateral rib cage. He states he has intermittent numbness in his thighs as well. He denies any loss of bowel bladder function. He has not had prior lumbar spine surgery. He has not had any epidural steroid injections. He reports that his legs are feeling weaker and that he had difficulty finishing shaving this morning due to weakness. He enjoys puzzles and currently lives in [place] since 2008.    Limitations Standing;Walking;House hold activities;Lifting   How long can you sit comfortably? n/a   How long can you stand  comfortably? less than 8 minutes   How long can you walk comfortably? 8-15 minutes   Diagnostic tests slump test, knees to chest, 6 min walk, Berg   Patient Stated Goals decrease pain, improve walking   Currently in Pain? Yes   Pain Score 2    Pain Location Hip   Pain Orientation Right   Pain Onset More than a month ago    Airex balance 2x60 seconds no LOB Airex head turns vertical and horizontal 2x60sec Airex pad balloon toss 2x2 minutes Ambulating across uneven surface (red mat pad) in // bars 8x. Carrying balls and tossing them into target across uneven surface 8x  Pt.  Required frequent rest breaks and verbal cues for breathing. Occasional LOB to the right >left.   Pt. response to medical necessity: Patient will continue to benefit from skilled physical therapy to improve pain levels, improve mobility and safety while ambulating, and to strengthen pt. To allow for return to prior level of function to safely interact with natural environment.         PT Long Term Goals - 05/19/17 1053      PT LONG TERM GOAL #1   Title Patient will increase Berg Balance score by > 6 points (51) to demonstrate decreased fall risk during functional activities.   Baseline 05/19/17: 45   Time 6   Period Weeks   Status New     PT LONG TERM GOAL #2   Title Patient will increase six minute walk test distance to >1000 for progression to community ambulator and improve gait ability   Baseline 6/1: 830 ft   Time 6   Period Weeks   Status New     PT LONG TERM GOAL #3   Title Patient will reduce modified Oswestry score to <32 as to demonstrate decreased disability with ADLs including improved sleeping tolerance, walking/sitting tolerance etc for better mobility with ADLs.    Baseline 6/1: 48%: Severe disabiilty   Time 6   Period Weeks   Status New     PT LONG TERM GOAL #4   Title Patient will decrease FABQ score by 8 points (58/96) for decreased perception of disability and improved quality of life.    Baseline 6/1: 66/96   Time 6   Period Weeks   Status New     PT LONG TERM GOAL #5   Title Patient will be independent in home exercise program to improve strength/mobility for better functional independence with ADLs.   Baseline 6/1: given HEP   Time 6   Period Weeks   Status New               Plan - 06/02/17 1143    Clinical Impression Statement Patient requires frequent verbal cues for purse lipped breathing/breathing while performing tasks. Increased tolerance for interventions was noted today with pt. Requiring less frequent rest breaks due to more  consistent respiratory rate. Patient is challenged by uneven surfaces but improved with repetition. Patient will continue to benefit from skilled physical therapy to improve pain levels, improve mobility and safety while ambulating, and to strengthen pt. To allow for return to prior level of function to safely interact with natural environment.    Rehab Potential Fair   Clinical Impairments Affecting Rehab Potential memory, chronicity   PT Frequency 1x / week   PT Duration 6 weeks   PT Treatment/Interventions ADLs/Self Care Home Management;Aquatic Therapy;Canalith Repostioning;Cryotherapy;Electrical Stimulation;Iontophoresis 4mg /ml Dexamethasone;Moist Heat;Traction;Ultrasound;DME Instruction;Gait training;Stair training;Balance training;Therapeutic exercise;Therapeutic activities;Functional mobility training;Neuromuscular re-education;Patient/family education;Manual  techniques;Passive range of motion;Vestibular;Energy conservation   PT Next Visit Plan progress balance and LE strengthening   PT Home Exercise Plan see sheet   Consulted and Agree with Plan of Care Patient      Patient will benefit from skilled therapeutic intervention in order to improve the following deficits and impairments:  Abnormal gait, Cardiopulmonary status limiting activity, Decreased activity tolerance, Decreased balance, Decreased coordination, Decreased endurance, Decreased knowledge of precautions, Decreased safety awareness, Decreased knowledge of use of DME, Decreased mobility, Decreased strength, Difficulty walking, Dizziness, Impaired flexibility, Impaired perceived functional ability, Increased muscle spasms, Postural dysfunction, Improper body mechanics, Pain  Visit Diagnosis: Chronic bilateral low back pain, with sciatica presence unspecified  Other abnormalities of gait and mobility  Muscle weakness (generalized)  Dizziness and giddiness     Problem List Patient Active Problem List   Diagnosis Date Noted   . PAD (peripheral artery disease) ([place]) 04/25/2017  . BPH with obstruction/lower urinary tract symptoms 01/10/2016  . Nocturia 01/10/2016  . Benign prostatic hyperplasia with urinary obstruction 01/10/2016  . AB (asthmatic bronchitis) 12/17/2014  . Carotid artery disease ([place]) 12/04/2014  . Acid reflux 12/04/2014  . H/O respiratory system disease 12/04/2014  . H/O peptic ulcer 12/04/2014  . Hypercholesterolemia 12/04/2014  . Unstable angina pectoris ([place]) 12/04/2014  . Amnesia 12/04/2014  . Chronic obstructive pulmonary disease ([place]) 06/11/2014  . Cephalalgia 06/11/2014  . BP (high blood pressure) 06/11/2014   Janna Arch, PT, DPT   06/02/2017, 11:45 AM  [facility] MAIN [facility] SERVICES [address] [place], [place], [address] Phone: [phone]   Fax:  [phone]  Name: Wesley Newton MRN: [id number] Date of Birth: 1926/12/20

## 2017-06-06 ENCOUNTER — Ambulatory Visit: Payer: Medicare Other

## 2017-06-06 DIAGNOSIS — M545 Low back pain: Principal | ICD-10-CM

## 2017-06-06 DIAGNOSIS — M6281 Muscle weakness (generalized): Secondary | ICD-10-CM

## 2017-06-06 DIAGNOSIS — G8929 Other chronic pain: Secondary | ICD-10-CM

## 2017-06-06 DIAGNOSIS — R42 Dizziness and giddiness: Secondary | ICD-10-CM

## 2017-06-06 DIAGNOSIS — R2689 Other abnormalities of gait and mobility: Secondary | ICD-10-CM

## 2017-06-06 NOTE — Therapy (Signed)
[place] MAIN [facility] SERVICES [address] [place], [place], [address] Phone: [phone]   Fax:  [phone]  Physical Therapy Treatment  Patient Details  Name: Wesley Newton MRN: [id number] Date of Birth: [birth year] Referring Provider: Era Bumpers MD  Encounter Date: 06/06/2017      PT End of Session - 06/06/17 1603    Visit Number 4   Number of Visits 10   Date for PT Re-Evaluation 06/16/17   Authorization - Visit Number 4   Authorization - Number of Visits 10   PT Start Time 3151   PT Stop Time 1601   PT Time Calculation (min) 46 min   Equipment Utilized During Treatment Gait belt   Activity Tolerance Patient tolerated treatment well;Patient limited by fatigue   Behavior During Therapy [facility] for tasks assessed/performed      Past Medical History:  Diagnosis Date  . BPH (benign prostatic hyperplasia)   . Cancer ([place])   . COPD (chronic obstructive pulmonary disease) ([place])   . DVT of leg (deep venous thrombosis) ([place])   . Elevated PSA   . Emphysema lung ([place])   . Hyperlipidemia   . Nocturia   . Pulmonary fibrosis ([place])   . Skin cancer     Past Surgical History:  Procedure Laterality Date  . CATARACT EXTRACTION    . HERNIA REPAIR    . TONSILLECTOMY      There were no vitals filed for this visit.      Subjective Assessment - 06/06/17 1515    Subjective Pt. reports some recent increase in L hip pain and no reports of falls.    Pertinent History Pt. is a pleasant 83 year old male who was referred to physical therapy for lumbar DDD. He reports having back pain for years. Pt. reports that he has a history of bulging discs, had therapy before and the back pain went away. Recently (in 5+ years) patients coughing/emphysema started becoming worse and caused back pain to return. He had pulmonary therapy about 2 years ago. He states the pain is worse when trying to get up from a flat position. He can improve with ambulation. He  denies any worsening with flexion-extension movements. He states he has intermittent pain that radiates along his thighs and to his hips. He also complains of stiffness and pain in his shoulder elbow and left lateral rib cage. He states he has intermittent numbness in his thighs as well. He denies any loss of bowel bladder function. He has not had prior lumbar spine surgery. He has not had any epidural steroid injections. He reports that his legs are feeling weaker and that he had difficulty finishing shaving this morning due to weakness. He enjoys puzzles and currently lives in [place] since 2008.    Limitations Standing;Walking;House hold activities;Lifting   How long can you sit comfortably? n/a   How long can you stand comfortably? less than 8 minutes   How long can you walk comfortably? 8-15 minutes   Diagnostic tests slump test, knees to chest, 6 min walk, Berg   Patient Stated Goals decrease pain, improve walking   Currently in Pain? Yes   Pain Score 2    Pain Location Hip   Pain Orientation Left   Pain Descriptors / Indicators Aching   Pain Type Chronic pain   Pain Onset More than a month ago   Pain Frequency Intermittent      TherEx Purse lipped breathing seated 10 breaths Purse  lipped breathing with scapular retractions 10x for upright posture  Purse lipped breathing arm circles seated Abduction side step // bars  with RTB, throwing ball into basket at each end of // bars Lateral step up 6" step throwing basketball on top of each step 12x each side Knees to chest in semi reclined position 1x60 seconds  Knees/trunk rotation in semi reclined (PT PROM) stretch L low back Semi reclined marches with TA contraction Sit to stand multiple trials, dizziness upon initial standing Purse lipped breathing multiple trials throughout session to regain control of breathing cycles.  Semi reclined hamstring PROM stretch 2x60 seconds, decreased hamstring length Standing trunk extension with UE  support Seated marches 60 second   Neuro Re-ed Airex balance 2x60 seconds no LOB Airex head turns vertical and horizontal [id number] Airex pad: replicating cornhole (tossing 20 balls into box), required multiple trials to get all 20 into box.  Ambulating across uneven surface (red mat pad)  18x. Carrying puzzle pieces across to make a puzzle  Pt. Required frequent rest breaks and verbal cues for breathing. Occasional LOB to the right >left.     Pt. response to medical necessity: Patient will continue to benefit from skilled physical therapy to improve pain levels, improve mobility and safety while ambulating, and to strengthen pt. To allow for return to prior level of function to safely interact with natural environment.        PT Long Term Goals - 05/19/17 1053      PT LONG TERM GOAL #1   Title Patient will increase Berg Balance score by > 6 points (51) to demonstrate decreased fall risk during functional activities.   Baseline 05/19/17: 45   Time 6   Period Weeks   Status New     PT LONG TERM GOAL #2   Title Patient will increase six minute walk test distance to >1000 for progression to community ambulator and improve gait ability   Baseline 6/1: 830 ft   Time 6   Period Weeks   Status New     PT LONG TERM GOAL #3   Title Patient will reduce modified Oswestry score to <32 as to demonstrate decreased disability with ADLs including improved sleeping tolerance, walking/sitting tolerance etc for better mobility with ADLs.    Baseline 6/1: 48%: Severe disabiilty   Time 6   Period Weeks   Status New     PT LONG TERM GOAL #4   Title Patient will decrease FABQ score by 8 points (58/96) for decreased perception of disability and improved quality of life.    Baseline 6/1: 66/96   Time 6   Period Weeks   Status New     PT LONG TERM GOAL #5   Title Patient will be independent in home exercise program to improve strength/mobility for better functional independence with ADLs.    Baseline 6/1: given HEP   Time 6   Period Weeks   Status New               Plan - 06/06/17 1605    Clinical Impression Statement Patient continues to require frequent cueing for breathing as he prefers to valsalva when concentrating. Semi reclined position alleviated coughing/improved breathing. Transferring from positions increased dizziness and coughing (semi reclined <> seated<> standing). Utilization of a puzzle improved pt. Ability to ambulate across dynamic surface due to pt. Investment into the task. Patient will continue to benefit from skilled physical therapy to improve pain levels, improve mobility and safety while ambulating,  and to strengthen pt. To allow for return to prior level of function to safely interact with natural environment.   Rehab Potential Fair   Clinical Impairments Affecting Rehab Potential memory, chronicity   PT Frequency 1x / week   PT Duration 6 weeks   PT Treatment/Interventions ADLs/Self Care Home Management;Aquatic Therapy;Canalith Repostioning;Cryotherapy;Electrical Stimulation;Iontophoresis 4mg /ml Dexamethasone;Moist Heat;Traction;Ultrasound;DME Instruction;Gait training;Stair training;Balance training;Therapeutic exercise;Therapeutic activities;Functional mobility training;Neuromuscular re-education;Patient/family education;Manual techniques;Passive range of motion;Vestibular;Energy conservation   PT Next Visit Plan progress balance and LE strengthening   PT Home Exercise Plan see sheet   Consulted and Agree with Plan of Care Patient      Patient will benefit from skilled therapeutic intervention in order to improve the following deficits and impairments:  Abnormal gait, Cardiopulmonary status limiting activity, Decreased activity tolerance, Decreased balance, Decreased coordination, Decreased endurance, Decreased knowledge of precautions, Decreased safety awareness, Decreased knowledge of use of DME, Decreased mobility, Decreased strength,  Difficulty walking, Dizziness, Impaired flexibility, Impaired perceived functional ability, Increased muscle spasms, Postural dysfunction, Improper body mechanics, Pain  Visit Diagnosis: Chronic bilateral low back pain, with sciatica presence unspecified  Other abnormalities of gait and mobility  Muscle weakness (generalized)  Dizziness and giddiness     Problem List Patient Active Problem List   Diagnosis Date Noted  . PAD (peripheral artery disease) ([place]) 04/25/2017  . BPH with obstruction/lower urinary tract symptoms 01/10/2016  . Nocturia 01/10/2016  . Benign prostatic hyperplasia with urinary obstruction 01/10/2016  . AB (asthmatic bronchitis) 12/17/2014  . Carotid artery disease ([place]) 12/04/2014  . Acid reflux 12/04/2014  . H/O respiratory system disease 12/04/2014  . H/O peptic ulcer 12/04/2014  . Hypercholesterolemia 12/04/2014  . Unstable angina pectoris ([place]) 12/04/2014  . Amnesia 12/04/2014  . Chronic obstructive pulmonary disease ([place]) 06/11/2014  . Cephalalgia 06/11/2014  . BP (high blood pressure) 06/11/2014    Janna Arch 06/06/2017, 4:09 PM  [place] [facility] MAIN [facility] SERVICES [address] [place], [place], [address] Phone: [phone]   Fax:  [phone]  Name: JAQUEL GLASSBURN MRN: [id number] Date of Birth: November 13, 1927

## 2017-06-09 ENCOUNTER — Ambulatory Visit: Payer: Medicare Other

## 2017-06-09 VITALS — BP 133/79 | HR 70

## 2017-06-09 DIAGNOSIS — R2689 Other abnormalities of gait and mobility: Secondary | ICD-10-CM

## 2017-06-09 DIAGNOSIS — R42 Dizziness and giddiness: Secondary | ICD-10-CM

## 2017-06-09 DIAGNOSIS — G8929 Other chronic pain: Secondary | ICD-10-CM

## 2017-06-09 DIAGNOSIS — M6281 Muscle weakness (generalized): Secondary | ICD-10-CM

## 2017-06-09 DIAGNOSIS — M545 Low back pain: Secondary | ICD-10-CM | POA: Diagnosis not present

## 2017-06-09 NOTE — Therapy (Signed)
[place] MAIN [facility] SERVICES [address] [place], [place], [address] Phone: [phone]   Fax:  [phone]  Physical Therapy Treatment  Patient Details  Name: Wesley Newton MRN: [id number] Date of Birth: 02-26-1927 Referring Provider: Era Bumpers MD  Encounter Date: 06/09/2017      PT End of Session - 06/09/17 1146    Visit Number 5   Number of Visits 10   Date for PT Re-Evaluation 06/16/17   Authorization - Visit Number 5   Authorization - Number of Visits 10   PT Start Time 5093   PT Stop Time 1132   PT Time Calculation (min) 47 min   Equipment Utilized During Treatment Gait belt   Activity Tolerance Patient tolerated treatment well;Patient limited by fatigue;Treatment limited secondary to medical complications (Comment)   Behavior During Therapy [facility] for tasks assessed/performed      Past Medical History:  Diagnosis Date  . BPH (benign prostatic hyperplasia)   . Cancer ([place])   . COPD (chronic obstructive pulmonary disease) ([place])   . DVT of leg (deep venous thrombosis) ([place])   . Elevated PSA   . Emphysema lung ([place])   . Hyperlipidemia   . Nocturia   . Pulmonary fibrosis ([place])   . Skin cancer     Past Surgical History:  Procedure Laterality Date  . CATARACT EXTRACTION    . HERNIA REPAIR    . TONSILLECTOMY      Vitals:   06/09/17 1053  BP: 133/79  Pulse: 70  SpO2: 95%        Subjective Assessment - 06/09/17 1049    Subjective Pt. reports feeling more dizzy today and has been coughing up more "deep stuff" than usual this morning. Pt. reports a few stumbles but no falls since last session. Pt. reports stumbling this morning.    Pertinent History Pt. is a pleasant 83 year old male who was referred to physical therapy for lumbar DDD. He reports having back pain for years. Pt. reports that he has a history of bulging discs, had therapy before and the back pain went away. Recently (in 5+ years) patients  coughing/emphysema started becoming worse and caused back pain to return. He had pulmonary therapy about 2 years ago. He states the pain is worse when trying to get up from a flat position. He can improve with ambulation. He denies any worsening with flexion-extension movements. He states he has intermittent pain that radiates along his thighs and to his hips. He also complains of stiffness and pain in his shoulder elbow and left lateral rib cage. He states he has intermittent numbness in his thighs as well. He denies any loss of bowel bladder function. He has not had prior lumbar spine surgery. He has not had any epidural steroid injections. He reports that his legs are feeling weaker and that he had difficulty finishing shaving this morning due to weakness. He enjoys puzzles and currently lives in [place] since 2008.    Limitations Standing;Walking;House hold activities;Lifting   How long can you sit comfortably? n/a   How long can you stand comfortably? less than 8 minutes   How long can you walk comfortably? 8-15 minutes   Diagnostic tests slump test, knees to chest, 6 min walk, Berg   Patient Stated Goals decrease pain, improve walking   Currently in Pain? Yes   Pain Score 2    Pain Orientation Left   Pain Onset More than a month ago  TherEx Purse lipped breathing seated 10 breaths Purse lipped breathing with scapular retractions 10x for upright posture  Purse lipped breathing arm circles seated Abduction side step // bars  with RTB, throwing ball into basket at each end of // bars Knees to chest in semi reclined position 1x60 seconds  Knees/trunk rotation in semi reclined (PT PROM) stretch L low back Purse lipped breathing multiple trials throughout session to regain control of breathing cycles.  Semi reclined hamstring PROM stretch 2x60 seconds, decreased hamstring length Standing abduction in // bars with decreasing UE support and constant cues for breathing. Forward and  backwards stepping in // bars with UE support.    Neuro Re-ed Airex balance 2x60 seconds no LOB Airex balance beam side steps single UE assist-no UE assist, slight stumble at end due to not stepping fully off, PT helped regain balance.  Airex head turns vertical and horizontal [id number] Airex pad: replicating cornhole (tossing 20 balls into box), required multiple trials to get all 20 into box.  Stepping over half foam roller with verbal and tactile cues for maintaining enough space for other foot, required SUE and CGA Pt. Required frequent rest breaks and verbal cues for breathing. Occasional LOB to the right >left.     BP: 133/79 (89) sp02 97, pulse 67.    Dizziness increased to 8-9/10 when standing vitals after = sp02: 97, pulse 69,  139/64 Blurry eyed and tired when standing up.     Pt. response to medical necessity: Patient will continue to benefit from skilled physical therapy to improve pain levels, improve mobility and safety while ambulating, and to strengthen pt. To allow for return to prior level of function to safely interact with natural environment.                     PT Education - 06/09/17 1146    Education provided Yes   Education Details breathing, safety with mobility while dizzy   Person(s) Educated Patient   Methods Explanation;Demonstration   Comprehension Verbalized understanding;Returned demonstration             PT Long Term Goals - 05/19/17 1053      PT LONG TERM GOAL #1   Title Patient will increase Berg Balance score by > 6 points (51) to demonstrate decreased fall risk during functional activities.   Baseline 05/19/17: 45   Time 6   Period Weeks   Status New     PT LONG TERM GOAL #2   Title Patient will increase six minute walk test distance to >1000 for progression to community ambulator and improve gait ability   Baseline 6/1: 830 ft   Time 6   Period Weeks   Status New     PT LONG TERM GOAL #3   Title Patient will  reduce modified Oswestry score to <32 as to demonstrate decreased disability with ADLs including improved sleeping tolerance, walking/sitting tolerance etc for better mobility with ADLs.    Baseline 6/1: 48%: Severe disabiilty   Time 6   Period Weeks   Status New     PT LONG TERM GOAL #4   Title Patient will decrease FABQ score by 8 points (58/96) for decreased perception of disability and improved quality of life.    Baseline 6/1: 66/96   Time 6   Period Weeks   Status New     PT LONG TERM GOAL #5   Title Patient will be independent in home exercise program to improve strength/mobility for  better functional independence with ADLs.   Baseline 6/1: given HEP   Time 6   Period Weeks   Status New               Plan - 06/09/17 1154    Clinical Impression Statement Pt. Presented with decreased respiratory ability, increased coughing, and increased dizziness. Vitals were monitored throughout session to ensure pt. Safety. Semi reclined position helped ease breathing. Dizziness decreased after standing activity however coughing was exacerbated. Patient was educated on safe mobility when dizzy and was observed sitting until dizziness decreased enough to return home.Patient will continue to benefit from skilled physical therapy to improve pain levels, improve mobility and safety while ambulating, and to strengthen pt. To allow for return to prior level of function to safely interact with natural environment.    Rehab Potential Fair   Clinical Impairments Affecting Rehab Potential memory, chronicity   PT Frequency 1x / week   PT Duration 6 weeks   PT Treatment/Interventions ADLs/Self Care Home Management;Aquatic Therapy;Canalith Repostioning;Cryotherapy;Electrical Stimulation;Iontophoresis 4mg /ml Dexamethasone;Moist Heat;Traction;Ultrasound;DME Instruction;Gait training;Stair training;Balance training;Therapeutic exercise;Therapeutic activities;Functional mobility training;Neuromuscular  re-education;Patient/family education;Manual techniques;Passive range of motion;Vestibular;Energy conservation   PT Next Visit Plan progress balance and LE strengthening   PT Home Exercise Plan see sheet   Consulted and Agree with Plan of Care Patient      Patient will benefit from skilled therapeutic intervention in order to improve the following deficits and impairments:  Abnormal gait, Cardiopulmonary status limiting activity, Decreased activity tolerance, Decreased balance, Decreased coordination, Decreased endurance, Decreased knowledge of precautions, Decreased safety awareness, Decreased knowledge of use of DME, Decreased mobility, Decreased strength, Difficulty walking, Dizziness, Impaired flexibility, Impaired perceived functional ability, Increased muscle spasms, Postural dysfunction, Improper body mechanics, Pain  Visit Diagnosis: Chronic bilateral low back pain, with sciatica presence unspecified  Other abnormalities of gait and mobility  Muscle weakness (generalized)  Dizziness and giddiness     Problem List Patient Active Problem List   Diagnosis Date Noted  . PAD (peripheral artery disease) ([place]) 04/25/2017  . BPH with obstruction/lower urinary tract symptoms 01/10/2016  . Nocturia 01/10/2016  . Benign prostatic hyperplasia with urinary obstruction 01/10/2016  . AB (asthmatic bronchitis) 12/17/2014  . Carotid artery disease ([place]) 12/04/2014  . Acid reflux 12/04/2014  . H/O respiratory system disease 12/04/2014  . H/O peptic ulcer 12/04/2014  . Hypercholesterolemia 12/04/2014  . Unstable angina pectoris ([place]) 12/04/2014  . Amnesia 12/04/2014  . Chronic obstructive pulmonary disease ([place]) 06/11/2014  . Cephalalgia 06/11/2014  . BP (high blood pressure) 06/11/2014   Janna Arch, PT, DPT   06/09/2017, 11:56 AM  [place] MAIN [facility] SERVICES [address] [place], [place], [address] Phone: [phone]   Fax:   [phone]  Name: Wesley Newton MRN: [id number] Date of Birth: [birth year]

## 2017-06-12 ENCOUNTER — Ambulatory Visit: Payer: Medicare Other

## 2017-06-12 DIAGNOSIS — R42 Dizziness and giddiness: Secondary | ICD-10-CM

## 2017-06-12 DIAGNOSIS — G8929 Other chronic pain: Secondary | ICD-10-CM

## 2017-06-12 DIAGNOSIS — R2689 Other abnormalities of gait and mobility: Secondary | ICD-10-CM

## 2017-06-12 DIAGNOSIS — M545 Low back pain: Principal | ICD-10-CM

## 2017-06-12 DIAGNOSIS — M6281 Muscle weakness (generalized): Secondary | ICD-10-CM

## 2017-06-12 NOTE — Therapy (Signed)
[place] MAIN [facility] SERVICES [address] [place], [place], [address] Phone: [phone]   Fax:  [phone]  Physical Therapy Treatment  Patient Details  Name: Wesley Newton MRN: [id number] Date of Birth: 1927/11/03 Referring Provider: Era Bumpers MD  Encounter Date: 06/12/2017      PT End of Session - 06/12/17 1635    Visit Number 6   Number of Visits 20   Date for PT Re-Evaluation 07/24/17   Authorization Time Period (next will be 1/10)   Authorization - Visit Number 6   Authorization - Number of Visits 10   PT Start Time 1510   PT Stop Time 1600   PT Time Calculation (min) 50 min   Equipment Utilized During Treatment Gait belt   Activity Tolerance Patient tolerated treatment well   Behavior During Therapy [facility] for tasks assessed/performed      Past Medical History:  Diagnosis Date  . BPH (benign prostatic hyperplasia)   . Cancer ([place])   . COPD (chronic obstructive pulmonary disease) ([place])   . DVT of leg (deep venous thrombosis) ([place])   . Elevated PSA   . Emphysema lung ([place])   . Hyperlipidemia   . Nocturia   . Pulmonary fibrosis ([place])   . Skin cancer     Past Surgical History:  Procedure Laterality Date  . CATARACT EXTRACTION    . HERNIA REPAIR    . TONSILLECTOMY      There were no vitals filed for this visit.     [facility] PT Assessment - 06/12/17 0001      Berg Balance Test   Sit to Stand Able to stand without using hands and stabilize independently   Standing Unsupported Able to stand safely 2 minutes   Sitting with Back Unsupported but Feet Supported on Floor or Stool Able to sit safely and securely 2 minutes   Stand to Sit Sits safely with minimal use of hands   Transfers Able to transfer safely, minor use of hands   Standing Unsupported with Eyes Closed Able to stand 10 seconds with supervision   Standing Ubsupported with Feet Together Able to place feet together independently and stand 1 minute safely    From Standing, Reach Forward with Outstretched Arm Can reach confidently >25 cm (10")   From Standing Position, Pick up Object from Floor Able to pick up shoe safely and easily   From Standing Position, Turn to Look Behind Over each Shoulder Looks behind from both sides and weight shifts well   Turn 360 Degrees Able to turn 360 degrees safely but slowly   Standing Unsupported, Alternately Place Feet on Step/Stool Able to stand independently and complete 8 steps >20 seconds   Standing Unsupported, One Foot in Front Able to plae foot ahead of the other independently and hold 30 seconds   Standing on One Leg Able to lift leg independently and hold 5-10 seconds   Total Score 50      Neuro Re-ed     Subjective Assessment - 06/12/17 1510    Subjective Pt. reports legs are feeling weak this morning and was slightly dizzy upon waking up.    Pertinent History Pt. is a pleasant 83 year old male who was referred to physical therapy for lumbar DDD. He reports having back pain for years. Pt. reports that he has a history of bulging discs, had therapy before and the back pain went away. Recently (in 5+ years) patients coughing/emphysema started becoming worse and caused  back pain to return. He had pulmonary therapy about 2 years ago. He states the pain is worse when trying to get up from a flat position. He can improve with ambulation. He denies any worsening with flexion-extension movements. He states he has intermittent pain that radiates along his thighs and to his hips. He also complains of stiffness and pain in his shoulder elbow and left lateral rib cage. He states he has intermittent numbness in his thighs as well. He denies any loss of bowel bladder function. He has not had prior lumbar spine surgery. He has not had any epidural steroid injections. He reports that his legs are feeling weaker and that he had difficulty finishing shaving this morning due to weakness. He enjoys puzzles and currently lives in  [place] since 2008.    Limitations Standing;Walking;House hold activities;Lifting   How long can you sit comfortably? n/a   How long can you stand comfortably? less than 8 minutes   How long can you walk comfortably? 8-15 minutes   Diagnostic tests slump test, knees to chest, 6 min walk, Berg   Patient Stated Goals decrease pain, improve walking   Pain Score 3    Pain Location Back   Pain Orientation Lower   Pain Descriptors / Indicators Aching   Pain Type Chronic pain   Pain Onset More than a month ago   Pain Frequency Intermittent     FABQ: 27/30, 23/66, 50/96 MODI: 38%= Mod perceived disability.  BERG:50/56= Low fall risk SLS: 18 seconds R, 4 seconds L Tandem walk In // bars with SUE assist x 2  TherEx 6 minute walk test:1133 Sit to stands with standing tolerance between utilizing puzzle to piece together 20 piece puzzle  Pt. response to medical necessity:  Patient will continue to benefit from skilled physical therapy to improve pain levels, improve mobility and safety while ambulating, and to strengthen patient to allow for return to prior level of function/safetly interact with natural environment.        [facility] PT Assessment - 06/12/17 0001      Berg Balance Test   Sit to Stand Able to stand without using hands and stabilize independently   Standing Unsupported Able to stand safely 2 minutes   Sitting with Back Unsupported but Feet Supported on Floor or Stool Able to sit safely and securely 2 minutes   Stand to Sit Sits safely with minimal use of hands   Transfers Able to transfer safely, minor use of hands   Standing Unsupported with Eyes Closed Able to stand 10 seconds with supervision   Standing Ubsupported with Feet Together Able to place feet together independently and stand 1 minute safely   From Standing, Reach Forward with Outstretched Arm Can reach confidently >25 cm (10")   From Standing Position, Pick up Object from Floor Able to pick up shoe safely and  easily   From Standing Position, Turn to Look Behind Over each Shoulder Looks behind from both sides and weight shifts well   Turn 360 Degrees Able to turn 360 degrees safely but slowly   Standing Unsupported, Alternately Place Feet on Step/Stool Able to stand independently and complete 8 steps >20 seconds   Standing Unsupported, One Foot in Front Able to plae foot ahead of the other independently and hold 30 seconds   Standing on One Leg Able to lift leg independently and hold 5-10 seconds   Total Score 50           PT Long  Term Goals - 06/12/17 1639      PT LONG TERM GOAL #1   Title Patient will increase Berg Balance score by > 6 points (51) to demonstrate decreased fall risk during functional activities.   Baseline 6/25: 50/56; 05/19/17: 45   Time 6   Period Weeks   Status On-going     PT LONG TERM GOAL #2   Title Patient will increase six minute walk test distance to >1000 for progression to community ambulator and improve gait ability   Baseline 6/25: 1133 ft; 6/1: 830 ft   Time 6   Period Weeks   Status Achieved     PT LONG TERM GOAL #3   Title Patient will reduce modified Oswestry score to <32 as to demonstrate decreased disability with ADLs including improved sleeping tolerance, walking/sitting tolerance etc for better mobility with ADLs.    Baseline 6/25: 38%; 6/1: 48%: Severe disabiilty   Time 6   Period Weeks   Status On-going     PT LONG TERM GOAL #4   Title Patient will decrease FABQ score by 8 points (58/96) for decreased perception of disability and improved quality of life.    Baseline 6/25: 50/96; 6/1: 66/96   Time 6   Period Weeks   Status Achieved     PT LONG TERM GOAL #5   Title Patient will be independent in home exercise program to improve strength/mobility for better functional independence with ADLs.   Baseline 6/1: given HEP   Time 6   Period Weeks   Status On-going     Additional Long Term Goals   Additional Long Term Goals Yes     PT LONG  TERM GOAL #6   Title Patient will decrease FABQ score by 8 points (42/96) for decreased perception of disability and improved quality of life.    Baseline 6/25: 50/96   Time 6   Period Weeks   Status New     PT LONG TERM GOAL #7   Title Patient will perform single limb stance >10 seconds bilaterally for improved stability while performing functional tasks.    Baseline 6/25: L: 4 seconds, R: 18 seconds   Time 6   Period Weeks   Status New               Plan - 06/12/17 1638    Clinical Impression Statement Patient required verbal guidance for task orientation. Dizziness combined with SOB secondary to COPD limits pt.'s capacity for function tasks and requires pt.'s to rest frequently. Balance is challenging to pt. Due to muscle weakness and fatigue, but is progressing each session. FABQ decreased by 8 points to 50/96. BERG=50/56, demonstrating improved balance, 6 minute walk test=1133 ft demonstrating improved community ambulation and mobility. Balance still challenged by dynamic surfaces at this time and dynamic actions such as stepping up. Patient will continue to benefit from skilled physical therapy to improve pain levels, improve mobility and safety while ambulating, and to strengthen patient to allow for return to prior level of function/safetly interact with natural environment.    Rehab Potential Fair   Clinical Impairments Affecting Rehab Potential memory, chronicity   PT Frequency 2x / week   PT Duration 6 weeks   PT Treatment/Interventions ADLs/Self Care Home Management;Aquatic Therapy;Canalith Repostioning;Cryotherapy;Electrical Stimulation;Iontophoresis 4mg /ml Dexamethasone;Moist Heat;Traction;Ultrasound;DME Instruction;Gait training;Stair training;Balance training;Therapeutic exercise;Therapeutic activities;Functional mobility training;Neuromuscular re-education;Patient/family education;Manual techniques;Passive range of motion;Vestibular;Energy conservation   PT Next Visit  Plan progress balance and LE strengthening   PT Home Exercise Plan see sheet  Consulted and Agree with Plan of Care Patient      Patient will benefit from skilled therapeutic intervention in order to improve the following deficits and impairments:  Abnormal gait, Cardiopulmonary status limiting activity, Decreased activity tolerance, Decreased balance, Decreased coordination, Decreased endurance, Decreased knowledge of precautions, Decreased safety awareness, Decreased knowledge of use of DME, Decreased mobility, Decreased strength, Difficulty walking, Dizziness, Impaired flexibility, Impaired perceived functional ability, Increased muscle spasms, Postural dysfunction, Improper body mechanics, Pain  Visit Diagnosis: Chronic bilateral low back pain, with sciatica presence unspecified  Other abnormalities of gait and mobility  Muscle weakness (generalized)  Dizziness and giddiness       G-Codes - June 28, 2017 1645    Functional Assessment Tool Used (Outpatient Only) FABQ, MODI, BERG, SLS, 6 minute walk test, clinical judgement,   Functional Limitation Mobility: Walking and moving around   Mobility: Walking and Moving Around Current Status [phone]) At least 40 percent but less than 60 percent impaired, limited or restricted   Mobility: Walking and Moving Around Goal Status [phone]) At least 20 percent but less than 40 percent impaired, limited or restricted      Problem List Patient Active Problem List   Diagnosis Date Noted  . PAD (peripheral artery disease) ([place]) 04/25/2017  . BPH with obstruction/lower urinary tract symptoms 01/10/2016  . Nocturia 01/10/2016  . Benign prostatic hyperplasia with urinary obstruction 01/10/2016  . AB (asthmatic bronchitis) 12/17/2014  . Carotid artery disease ([place]) 12/04/2014  . Acid reflux 12/04/2014  . H/O respiratory system disease 12/04/2014  . H/O peptic ulcer 12/04/2014  . Hypercholesterolemia 12/04/2014  . Unstable angina pectoris ([place])  12/04/2014  . Amnesia 12/04/2014  . Chronic obstructive pulmonary disease ([place]) 06/11/2014  . Cephalalgia 06/11/2014  . BP (high blood pressure) 06/11/2014   Janna Arch, PT, DPT   June 28, 2017, 4:46 PM  [place] MAIN [facility] SERVICES [address] [place], [place], [address] Phone: [phone]   Fax:  [phone]  Name: Wesley Newton MRN: [id number] Date of Birth: 01/03/1927

## 2017-06-15 ENCOUNTER — Ambulatory Visit: Payer: Medicare Other | Admitting: Physical Therapy

## 2017-06-15 ENCOUNTER — Ambulatory Visit: Payer: Medicare Other

## 2017-06-15 DIAGNOSIS — M6281 Muscle weakness (generalized): Secondary | ICD-10-CM

## 2017-06-15 DIAGNOSIS — R2689 Other abnormalities of gait and mobility: Secondary | ICD-10-CM

## 2017-06-15 DIAGNOSIS — M545 Low back pain: Secondary | ICD-10-CM | POA: Diagnosis not present

## 2017-06-15 DIAGNOSIS — R42 Dizziness and giddiness: Secondary | ICD-10-CM

## 2017-06-15 DIAGNOSIS — G8929 Other chronic pain: Secondary | ICD-10-CM

## 2017-06-15 NOTE — Therapy (Signed)
[place] MAIN [facility] SERVICES [address] [place], [place], [address] Phone: [phone]   Fax:  [phone]  Physical Therapy Treatment  Patient Details  Name: Wesley Newton MRN: [id number] Date of Birth: [birth year] Referring Provider: Era Bumpers MD  Encounter Date: 06/15/2017      PT End of Session - 06/15/17 1900    Visit Number 7   Number of Visits 20   Date for PT Re-Evaluation 07/24/17   Authorization - Visit Number 1   Authorization - Number of Visits 10   PT Start Time 5597   PT Stop Time 1600   PT Time Calculation (min) 45 min   Equipment Utilized During Treatment Gait belt   Activity Tolerance Patient tolerated treatment well   Behavior During Therapy [facility] for tasks assessed/performed      Past Medical History:  Diagnosis Date  . BPH (benign prostatic hyperplasia)   . Cancer ([place])   . COPD (chronic obstructive pulmonary disease) ([place])   . DVT of leg (deep venous thrombosis) ([place])   . Elevated PSA   . Emphysema lung ([place])   . Hyperlipidemia   . Nocturia   . Pulmonary fibrosis ([place])   . Skin cancer     Past Surgical History:  Procedure Laterality Date  . CATARACT EXTRACTION    . HERNIA REPAIR    . TONSILLECTOMY      There were no vitals filed for this visit.      Subjective Assessment - 06/15/17 1517    Subjective Pt. reports he overdid it while playing cornhole yesterday. Played for 2+ hours.    Pertinent History Pt. is a pleasant 83 year old male who was referred to physical therapy for lumbar DDD. He reports having back pain for years. Pt. reports that he has a history of bulging discs, had therapy before and the back pain went away. Recently (in 5+ years) patients coughing/emphysema started becoming worse and caused back pain to return. He had pulmonary therapy about 2 years ago. He states the pain is worse when trying to get up from a flat position. He can improve with ambulation. He denies any worsening  with flexion-extension movements. He states he has intermittent pain that radiates along his thighs and to his hips. He also complains of stiffness and pain in his shoulder elbow and left lateral rib cage. He states he has intermittent numbness in his thighs as well. He denies any loss of bowel bladder function. He has not had prior lumbar spine surgery. He has not had any epidural steroid injections. He reports that his legs are feeling weaker and that he had difficulty finishing shaving this morning due to weakness. He enjoys puzzles and currently lives in [place] since 2008.    Limitations Standing;Walking;House hold activities;Lifting   How long can you sit comfortably? n/a   How long can you stand comfortably? less than 8 minutes   How long can you walk comfortably? 8-15 minutes   Diagnostic tests slump test, knees to chest, 6 min walk, Berg   Patient Stated Goals decrease pain, improve walking   Currently in Pain? Yes   Pain Score 6    Pain Location Back   Pain Orientation Left;Lower   Pain Descriptors / Indicators Aching   Pain Type Chronic pain   Pain Radiating Towards hip   Pain Onset More than a month ago      TherEx Purse lipped breathing semi reclined 10 breaths Purse lipped breathing  with scapular retractions 10x for upright posture  Purse lipped breathing arm circles seated Purse lipped breathing multiple trials throughout session to regain control of breathing cycles.  Semi reclined hamstring PROM stretch 2x60 seconds, decreased hamstring length, popliteal stretch 2x60 seconds  Knees/trunk rotation in semi reclined (PT PROM) stretch L low back Semi reclined: single knee to chest 2x30 seconds Semi reclined TA contraction with swiss ball, difficulty with regulating breathing Forward and backwards stepping in // bars with UE support.    Neuro Re-ed airex balance beam tandem walk x8, side step x6 (completing a puzzle) Step up and over 2 consecutive 6" steps (completing a  puzzle) Airex balance 2x60 seconds no LOB airex balance marching Airex head turns vertical and horizontal 2x60sec Eyes closed balance 60 seconds Pt. Required frequent rest breaks and verbal cues for breathing. Occasional LOB to the right >left.   Patient will continue to benefit from skilled physical therapy to improve pain levels, improve mobility and safety while ambulating, and to strengthen pt. To allow for return to prior level of function to safely interact with natural environment.                          PT Education - 06/15/17 1901    Education provided Yes   Education Details Engineer, maintenance (IT) for dynamic balance tasks   Person(s) Educated Patient   Methods Explanation;Tactile cues;Verbal cues;Demonstration   Comprehension Verbalized understanding;Returned demonstration;Verbal cues required;Tactile cues required             PT Long Term Goals - 06/12/17 1639      PT LONG TERM GOAL #1   Title Patient will increase Berg Balance score by > 6 points (51) to demonstrate decreased fall risk during functional activities.   Baseline 6/25: 50/56; 05/19/17: 45   Time 6   Period Weeks   Status On-going     PT LONG TERM GOAL #2   Title Patient will increase six minute walk test distance to >1000 for progression to community ambulator and improve gait ability   Baseline 6/25: 1133 ft; 6/1: 830 ft   Time 6   Period Weeks   Status Achieved     PT LONG TERM GOAL #3   Title Patient will reduce modified Oswestry score to <32 as to demonstrate decreased disability with ADLs including improved sleeping tolerance, walking/sitting tolerance etc for better mobility with ADLs.    Baseline 6/25: 38%; 6/1: 48%: Severe disabiilty   Time 6   Period Weeks   Status On-going     PT LONG TERM GOAL #4   Title Patient will decrease FABQ score by 8 points (58/96) for decreased perception of disability and improved quality of life.    Baseline 6/25: 50/96; 6/1: 66/96   Time  6   Period Weeks   Status Achieved     PT LONG TERM GOAL #5   Title Patient will be independent in home exercise program to improve strength/mobility for better functional independence with ADLs.   Baseline 6/1: given HEP   Time 6   Period Weeks   Status On-going     Additional Long Term Goals   Additional Long Term Goals Yes     PT LONG TERM GOAL #6   Title Patient will decrease FABQ score by 8 points (42/96) for decreased perception of disability and improved quality of life.    Baseline 6/25: 50/96   Time 6   Period Weeks  Status New     PT LONG TERM GOAL #7   Title Patient will perform single limb stance >10 seconds bilaterally for improved stability while performing functional tasks.    Baseline 6/25: L: 4 seconds, R: 18 seconds   Time 6   Period Weeks   Status New               Plan - 06/15/17 1900    Clinical Impression Statement Patient demonstrated improved capacity for functional tasks such as dynamic balance walking and stepping over obstacles while working on a puzzle. Low back pain relieved with manual stretching and positioning. Frequent cues for breathing and tactile cues for posture implemented throughout session.  Patient will continue to benefit from skilled physical therapy to improve pain levels, improve mobility and safety while ambulating, and to strengthen pt. To allow for return to prior level of function to safely interact with natural environment.   Rehab Potential Fair   Clinical Impairments Affecting Rehab Potential memory, chronicity   PT Frequency 2x / week   PT Duration 6 weeks   PT Treatment/Interventions ADLs/Self Care Home Management;Aquatic Therapy;Canalith Repostioning;Cryotherapy;Electrical Stimulation;Iontophoresis 4mg /ml Dexamethasone;Moist Heat;Traction;Ultrasound;DME Instruction;Gait training;Stair training;Balance training;Therapeutic exercise;Therapeutic activities;Functional mobility training;Neuromuscular  re-education;Patient/family education;Manual techniques;Passive range of motion;Vestibular;Energy conservation   PT Next Visit Plan progress balance and LE strengthening   PT Home Exercise Plan see sheet   Consulted and Agree with Plan of Care Patient      Patient will benefit from skilled therapeutic intervention in order to improve the following deficits and impairments:  Abnormal gait, Cardiopulmonary status limiting activity, Decreased activity tolerance, Decreased balance, Decreased coordination, Decreased endurance, Decreased knowledge of precautions, Decreased safety awareness, Decreased knowledge of use of DME, Decreased mobility, Decreased strength, Difficulty walking, Dizziness, Impaired flexibility, Impaired perceived functional ability, Increased muscle spasms, Postural dysfunction, Improper body mechanics, Pain  Visit Diagnosis: Chronic bilateral low back pain, with sciatica presence unspecified  Other abnormalities of gait and mobility  Muscle weakness (generalized)  Dizziness and giddiness     Problem List Patient Active Problem List   Diagnosis Date Noted  . PAD (peripheral artery disease) ([place]) 04/25/2017  . BPH with obstruction/lower urinary tract symptoms 01/10/2016  . Nocturia 01/10/2016  . Benign prostatic hyperplasia with urinary obstruction 01/10/2016  . AB (asthmatic bronchitis) 12/17/2014  . Carotid artery disease ([place]) 12/04/2014  . Acid reflux 12/04/2014  . H/O respiratory system disease 12/04/2014  . H/O peptic ulcer 12/04/2014  . Hypercholesterolemia 12/04/2014  . Unstable angina pectoris ([place]) 12/04/2014  . Amnesia 12/04/2014  . Chronic obstructive pulmonary disease ([place]) 06/11/2014  . Cephalalgia 06/11/2014  . BP (high blood pressure) 06/11/2014   Janna Arch, PT, DPT   Janna Arch 06/16/2017, 8:05 AM  [place] MAIN [facility] SERVICES [address] [place], [place], [address] Phone: [phone]   Fax:   [phone]  Name: Wesley Newton MRN: [id number] Date of Birth: [birth year]

## 2017-06-22 ENCOUNTER — Ambulatory Visit: Payer: Medicare Other | Admitting: Physical Therapy

## 2017-06-22 ENCOUNTER — Ambulatory Visit: Payer: Medicare Other | Attending: Neurological Surgery

## 2017-06-22 DIAGNOSIS — G8929 Other chronic pain: Secondary | ICD-10-CM | POA: Diagnosis present

## 2017-06-22 DIAGNOSIS — R2689 Other abnormalities of gait and mobility: Secondary | ICD-10-CM | POA: Insufficient documentation

## 2017-06-22 DIAGNOSIS — M6281 Muscle weakness (generalized): Secondary | ICD-10-CM | POA: Diagnosis present

## 2017-06-22 DIAGNOSIS — R42 Dizziness and giddiness: Secondary | ICD-10-CM | POA: Diagnosis present

## 2017-06-22 DIAGNOSIS — M545 Low back pain: Secondary | ICD-10-CM | POA: Diagnosis present

## 2017-06-22 NOTE — Therapy (Signed)
[place] MAIN [facility] SERVICES [address] [place], [place], [address] Phone: [phone]   Fax:  [phone]  Physical Therapy Treatment  Patient Details  Name: Wesley Newton MRN: [id number] Date of Birth: 1927/09/13 Referring Provider: Era Bumpers MD  Encounter Date: 06/22/2017      PT End of Session - 06/23/17 0751    Visit Number 8   Number of Visits 20   Date for PT Re-Evaluation 07/24/17   Authorization - Visit Number 2   Authorization - Number of Visits 10   PT Start Time 0175   PT Stop Time 1600   PT Time Calculation (min) 45 min   Equipment Utilized During Treatment Gait belt   Activity Tolerance Patient tolerated treatment well   Behavior During Therapy [facility] for tasks assessed/performed      Past Medical History:  Diagnosis Date  . BPH (benign prostatic hyperplasia)   . Cancer ([place])   . COPD (chronic obstructive pulmonary disease) ([place])   . DVT of leg (deep venous thrombosis) ([place])   . Elevated PSA   . Emphysema lung ([place])   . Hyperlipidemia   . Nocturia   . Pulmonary fibrosis ([place])   . Skin cancer     Past Surgical History:  Procedure Laterality Date  . CATARACT EXTRACTION    . HERNIA REPAIR    . TONSILLECTOMY      There were no vitals filed for this visit.      Subjective Assessment - 06/22/17 1518    Subjective Pt. did 20 minutes on Nust[place] this morning. Is feeling fatigued    Pertinent History Pt. is a pleasant 83 year old male who was referred to physical therapy for lumbar DDD. He reports having back pain for years. Pt. reports that he has a history of bulging discs, had therapy before and the back pain went away. Recently (in 5+ years) patients coughing/emphysema started becoming worse and caused back pain to return. He had pulmonary therapy about 2 years ago. He states the pain is worse when trying to get up from a flat position. He can improve with ambulation. He denies any worsening with  flexion-extension movements. He states he has intermittent pain that radiates along his thighs and to his hips. He also complains of stiffness and pain in his shoulder elbow and left lateral rib cage. He states he has intermittent numbness in his thighs as well. He denies any loss of bowel bladder function. He has not had prior lumbar spine surgery. He has not had any epidural steroid injections. He reports that his legs are feeling weaker and that he had difficulty finishing shaving this morning due to weakness. He enjoys puzzles and currently lives in [place] since 2008.    Limitations Standing;Walking;House hold activities;Lifting   How long can you sit comfortably? n/a   How long can you stand comfortably? less than 8 minutes   How long can you walk comfortably? 8-15 minutes   Diagnostic tests slump test, knees to chest, 6 min walk, Berg   Patient Stated Goals decrease pain, improve walking   Currently in Pain? Yes   Pain Score 3    Pain Location Back   Pain Orientation Upper   Pain Onset More than a month ago      TherEx Purse lipped breathing semi reclined 10 breaths Purse lipped breathing with scapular retractions 10x for upright posture  Purse lipped breathing arm circles seated Purse lipped breathing multiple trials throughout session  to regain control of breathing cycles.  Forward and backwards stepping in // bars with UE support.  Side stepping in // bars RTB around ankles 6x  Seated adduction with green ball resistance 15x Seated marching 20x Resisted knee flexion red theraband 15x each leg  Resisted plantarflexion 15x each leg     Neuro Re-ed airex balance beam tandem walk x8, side step x6  Step up and over 2 consecutive 6" steps to throw ball into basket 12x Airex balance 1x60 seconds no LOB airex balance horizontal head turns and vertical turns 2x60 sec Eyes closed balance 60 seconds Airex tandem stance 2x45 seconds Pt. Required frequent rest breaks and verbal  cues for breathing. Occasional LOB to the right >left. Stepping over two consecutive orange hurdles    Patient will continue to benefit from skilled physical therapy to improve pain levels, improve mobility and safety while ambulating, and to strengthen pt. To allow for return to prior level of function to safely interact with natural environment.          PT Long Term Goals - 06/12/17 1639      PT LONG TERM GOAL #1   Title Patient will increase Berg Balance score by > 6 points (51) to demonstrate decreased fall risk during functional activities.   Baseline 6/25: 50/56; 05/19/17: 45   Time 6   Period Weeks   Status On-going     PT LONG TERM GOAL #2   Title Patient will increase six minute walk test distance to >1000 for progression to community ambulator and improve gait ability   Baseline 6/25: 1133 ft; 6/1: 830 ft   Time 6   Period Weeks   Status Achieved     PT LONG TERM GOAL #3   Title Patient will reduce modified Oswestry score to <32 as to demonstrate decreased disability with ADLs including improved sleeping tolerance, walking/sitting tolerance etc for better mobility with ADLs.    Baseline 6/25: 38%; 6/1: 48%: Severe disabiilty   Time 6   Period Weeks   Status On-going     PT LONG TERM GOAL #4   Title Patient will decrease FABQ score by 8 points (58/96) for decreased perception of disability and improved quality of life.    Baseline 6/25: 50/96; 6/1: 66/96   Time 6   Period Weeks   Status Achieved     PT LONG TERM GOAL #5   Title Patient will be independent in home exercise program to improve strength/mobility for better functional independence with ADLs.   Baseline 6/1: given HEP   Time 6   Period Weeks   Status On-going     Additional Long Term Goals   Additional Long Term Goals Yes     PT LONG TERM GOAL #6   Title Patient will decrease FABQ score by 8 points (42/96) for decreased perception of disability and improved quality of life.    Baseline 6/25:  50/96   Time 6   Period Weeks   Status New     PT LONG TERM GOAL #7   Title Patient will perform single limb stance >10 seconds bilaterally for improved stability while performing functional tasks.    Baseline 6/25: L: 4 seconds, R: 18 seconds   Time 6   Period Weeks   Status New               Plan - 06/23/17 0751    Clinical Impression Statement Patient limited by musculoskeletal fatigue related to physical activity prior  in the day. Frequent seated rest breaks required. End of session performed seated due to fatigue. Frequent verbal cues required for breathing.  Patient will continue to benefit from skilled physical therapy to improve pain levels, improve mobility and safety while ambulating, and to strengthen pt. To allow for return to prior level of function to safely interact with natural environment.   Rehab Potential Fair   Clinical Impairments Affecting Rehab Potential memory, chronicity   PT Frequency 2x / week   PT Duration 6 weeks   PT Treatment/Interventions ADLs/Self Care Home Management;Aquatic Therapy;Canalith Repostioning;Cryotherapy;Electrical Stimulation;Iontophoresis 4mg /ml Dexamethasone;Moist Heat;Traction;Ultrasound;DME Instruction;Gait training;Stair training;Balance training;Therapeutic exercise;Therapeutic activities;Functional mobility training;Neuromuscular re-education;Patient/family education;Manual techniques;Passive range of motion;Vestibular;Energy conservation   PT Next Visit Plan progress balance and LE strengthening   PT Home Exercise Plan see sheet   Consulted and Agree with Plan of Care Patient      Patient will benefit from skilled therapeutic intervention in order to improve the following deficits and impairments:  Abnormal gait, Cardiopulmonary status limiting activity, Decreased activity tolerance, Decreased balance, Decreased coordination, Decreased endurance, Decreased knowledge of precautions, Decreased safety awareness, Decreased knowledge  of use of DME, Decreased mobility, Decreased strength, Difficulty walking, Dizziness, Impaired flexibility, Impaired perceived functional ability, Increased muscle spasms, Postural dysfunction, Improper body mechanics, Pain  Visit Diagnosis: Chronic bilateral low back pain, with sciatica presence unspecified  Other abnormalities of gait and mobility  Muscle weakness (generalized)  Dizziness and giddiness     Problem List Patient Active Problem List   Diagnosis Date Noted  . PAD (peripheral artery disease) ([place]) 04/25/2017  . BPH with obstruction/lower urinary tract symptoms 01/10/2016  . Nocturia 01/10/2016  . Benign prostatic hyperplasia with urinary obstruction 01/10/2016  . AB (asthmatic bronchitis) 12/17/2014  . Carotid artery disease ([place]) 12/04/2014  . Acid reflux 12/04/2014  . H/O respiratory system disease 12/04/2014  . H/O peptic ulcer 12/04/2014  . Hypercholesterolemia 12/04/2014  . Unstable angina pectoris ([place]) 12/04/2014  . Amnesia 12/04/2014  . Chronic obstructive pulmonary disease ([place]) 06/11/2014  . Cephalalgia 06/11/2014  . BP (high blood pressure) 06/11/2014   Janna Arch, PT, DPT   Janna Arch 06/23/2017, 7:53 AM  [place] MAIN [facility] SERVICES [address] [place], [place], [address] Phone: [phone]   Fax:  [phone]  Name: COTEY RAKES MRN: [id number] Date of Birth: Jun 22, 1927

## 2017-06-26 ENCOUNTER — Ambulatory Visit: Payer: Medicare Other

## 2017-06-26 DIAGNOSIS — M545 Low back pain: Secondary | ICD-10-CM | POA: Diagnosis not present

## 2017-06-26 DIAGNOSIS — M6281 Muscle weakness (generalized): Secondary | ICD-10-CM

## 2017-06-26 DIAGNOSIS — G8929 Other chronic pain: Secondary | ICD-10-CM

## 2017-06-26 DIAGNOSIS — R2689 Other abnormalities of gait and mobility: Secondary | ICD-10-CM

## 2017-06-26 DIAGNOSIS — R42 Dizziness and giddiness: Secondary | ICD-10-CM

## 2017-06-26 NOTE — Therapy (Signed)
[place] MAIN [facility] SERVICES [address] [place], [place], [address] Phone: [phone]   Fax:  [phone]  Physical Therapy Treatment  Patient Details  Name: Wesley Newton MRN: [id number] Date of Birth: 1927/03/20 Referring Provider: Era Bumpers MD  Encounter Date: 06/26/2017      PT End of Session - 06/26/17 1552    Visit Number 9   Number of Visits 20   Date for PT Re-Evaluation 07/24/17   Authorization - Visit Number 3   Authorization - Number of Visits 10   PT Start Time 1504   PT Stop Time 1550   PT Time Calculation (min) 46 min   Equipment Utilized During Treatment Gait belt   Activity Tolerance Patient tolerated treatment well;Treatment limited secondary to medical complications (Comment)   Behavior During Therapy [name] for tasks assessed/performed      Past Medical History:  Diagnosis Date  . BPH (benign prostatic hyperplasia)   . Cancer ([place])   . COPD (chronic obstructive pulmonary disease) ([place])   . DVT of leg (deep venous thrombosis) ([place])   . Elevated PSA   . Emphysema lung ([place])   . Hyperlipidemia   . Nocturia   . Pulmonary fibrosis ([place])   . Skin cancer     Past Surgical History:  Procedure Laterality Date  . CATARACT EXTRACTION    . HERNIA REPAIR    . TONSILLECTOMY      There were no vitals filed for this visit.      Subjective Assessment - 06/26/17 1506    Subjective Pt. had bad left hip pain this morning that went away around noon.  Pt. reports yesterday was a bad day, sat for a long period of time watching a movie and got a headdache and backache.    Pertinent History Pt. is a pleasant 83 year old male who was referred to physical therapy for lumbar DDD. He reports having back pain for years. Pt. reports that he has a history of bulging discs, had therapy before and the back pain went away. Recently (in 5+ years) patients coughing/emphysema started becoming worse and caused back pain to return. He had  pulmonary therapy about 2 years ago. He states the pain is worse when trying to get up from a flat position. He can improve with ambulation. He denies any worsening with flexion-extension movements. He states he has intermittent pain that radiates along his thighs and to his hips. He also complains of stiffness and pain in his shoulder elbow and left lateral rib cage. He states he has intermittent numbness in his thighs as well. He denies any loss of bowel bladder function. He has not had prior lumbar spine surgery. He has not had any epidural steroid injections. He reports that his legs are feeling weaker and that he had difficulty finishing shaving this morning due to weakness. He enjoys puzzles and currently lives in [place] since 2008.    Limitations Standing;Walking;House hold activities;Lifting   How long can you sit comfortably? n/a   How long can you stand comfortably? less than 8 minutes   How long can you walk comfortably? 8-15 minutes   Diagnostic tests slump test, knees to chest, 6 min walk, Berg   Patient Stated Goals decrease pain, improve walking   Currently in Pain? Yes   Pain Score 2    Pain Location Hip   Pain Orientation Left   Pain Descriptors / Indicators Aching   Pain Type Chronic pain  Pain Radiating Towards back   Pain Onset More than a month ago   Pain Frequency Intermittent     TherEx Purse lipped breathing semi reclined Purse lipped breathing arm circles seated Purse lipped breathing multiple trials throughout session to regain control of breathing cycles.  Forward and backwards stepping in // bars with UE support.  Side stepping in // bars RTB around ankles 6x  TA contraction 2x 12x Thoracic LE hooklying rotations to decrease low back pain 10x Semi reclines:Hamstring PROM 60 second holds with df overpressure, popliteal angle 60 seconds, IR, ER x 45 seconds    Neuro Re-ed VOR1 x30 seconds, increased dizziness by 3 points Airex balance throwing balls into  basket x20 balls airex balance horizontal head turns and vertical turns 2x60 sec Eyes closed balance 60 seconds Airex tandem stance throwing 20 balls, three LOB, able to catch self on // bars Tapping stepping stones blue green pink with PT verbally directing Airex balance balloon pass able to go outside of BOS and catch balance and return to COM  Pt. Required frequent rest breaks and verbal cues for breathing. Occasional LOB to the right >left.     Patient will continue to benefit from skilled physical therapy to improve pain levels, improve mobility and safety while ambulating, and to strengthen pt. To allow for return to prior level of function to safely interact with natural environment.        PT Long Term Goals - 06/12/17 1639      PT LONG TERM GOAL #1   Title Patient will increase Berg Balance score by > 6 points (51) to demonstrate decreased fall risk during functional activities.   Baseline 6/25: 50/56; 05/19/17: 45   Time 6   Period Weeks   Status On-going     PT LONG TERM GOAL #2   Title Patient will increase six minute walk test distance to >1000 for progression to community ambulator and improve gait ability   Baseline 6/25: 1133 ft; 6/1: 830 ft   Time 6   Period Weeks   Status Achieved     PT LONG TERM GOAL #3   Title Patient will reduce modified Oswestry score to <32 as to demonstrate decreased disability with ADLs including improved sleeping tolerance, walking/sitting tolerance etc for better mobility with ADLs.    Baseline 6/25: 38%; 6/1: 48%: Severe disabiilty   Time 6   Period Weeks   Status On-going     PT LONG TERM GOAL #4   Title Patient will decrease FABQ score by 8 points (58/96) for decreased perception of disability and improved quality of life.    Baseline 6/25: 50/96; 6/1: 66/96   Time 6   Period Weeks   Status Achieved     PT LONG TERM GOAL #5   Title Patient will be independent in home exercise program to improve strength/mobility for better  functional independence with ADLs.   Baseline 6/1: given HEP   Time 6   Period Weeks   Status On-going     Additional Long Term Goals   Additional Long Term Goals Yes     PT LONG TERM GOAL #6   Title Patient will decrease FABQ score by 8 points (42/96) for decreased perception of disability and improved quality of life.    Baseline 6/25: 50/96   Time 6   Period Weeks   Status New     PT LONG TERM GOAL #7   Title Patient will perform single limb stance >10 seconds  bilaterally for improved stability while performing functional tasks.    Baseline 6/25: L: 4 seconds, R: 18 seconds   Time 6   Period Weeks   Status New               Plan - 06/26/17 1555    Clinical Impression Statement Patient limited in dynamic standing activities due to presence of low back/hip pain and exacerbation of breathing. Pt. Required frequent verbal cueing for continuation/task orientation for breathing. Dizziness increased with transitional movements. Tandem balance progressing with dynamic activity implemented.  Patient will continue to benefit from skilled physical therapy to improve pain levels, improve mobility and safety while ambulating, and to strengthen pt. To allow for return to prior level of function to safely interact with natural environment.    Rehab Potential Fair   Clinical Impairments Affecting Rehab Potential memory, chronicity   PT Frequency 2x / week   PT Duration 6 weeks   PT Treatment/Interventions ADLs/Self Care Home Management;Aquatic Therapy;Canalith Repostioning;Cryotherapy;Electrical Stimulation;Iontophoresis 4mg /ml Dexamethasone;Moist Heat;Traction;Ultrasound;DME Instruction;Gait training;Stair training;Balance training;Therapeutic exercise;Therapeutic activities;Functional mobility training;Neuromuscular re-education;Patient/family education;Manual techniques;Passive range of motion;Vestibular;Energy conservation   PT Next Visit Plan recheck goals. assess to cardiovascular  clinic?   PT Home Exercise Plan see sheet   Consulted and Agree with Plan of Care Patient      Patient will benefit from skilled therapeutic intervention in order to improve the following deficits and impairments:  Abnormal gait, Cardiopulmonary status limiting activity, Decreased activity tolerance, Decreased balance, Decreased coordination, Decreased endurance, Decreased knowledge of precautions, Decreased safety awareness, Decreased knowledge of use of DME, Decreased mobility, Decreased strength, Difficulty walking, Dizziness, Impaired flexibility, Impaired perceived functional ability, Increased muscle spasms, Postural dysfunction, Improper body mechanics, Pain  Visit Diagnosis: Chronic bilateral low back pain, with sciatica presence unspecified  Other abnormalities of gait and mobility  Muscle weakness (generalized)  Dizziness and giddiness     Problem List Patient Active Problem List   Diagnosis Date Noted  . PAD (peripheral artery disease) ([place]) 04/25/2017  . BPH with obstruction/lower urinary tract symptoms 01/10/2016  . Nocturia 01/10/2016  . Benign prostatic hyperplasia with urinary obstruction 01/10/2016  . AB (asthmatic bronchitis) 12/17/2014  . Carotid artery disease ([place]) 12/04/2014  . Acid reflux 12/04/2014  . H/O respiratory system disease 12/04/2014  . H/O peptic ulcer 12/04/2014  . Hypercholesterolemia 12/04/2014  . Unstable angina pectoris ([place]) 12/04/2014  . Amnesia 12/04/2014  . Chronic obstructive pulmonary disease ([place]) 06/11/2014  . Cephalalgia 06/11/2014  . BP (high blood pressure) 06/11/2014   Janna Arch, PT, DPT   Janna Arch 06/26/2017, 3:57 PM  [place] MAIN [facility] SERVICES [address] [place], [place], [address] Phone: [phone]   Fax:  [phone]  Name: Wesley Newton MRN: [id number] Date of Birth: 24-Apr-1927

## 2017-06-28 ENCOUNTER — Ambulatory Visit: Payer: Medicare Other

## 2017-06-28 VITALS — BP 128/63 | HR 70 | Resp 97

## 2017-06-28 DIAGNOSIS — R42 Dizziness and giddiness: Secondary | ICD-10-CM

## 2017-06-28 DIAGNOSIS — M6281 Muscle weakness (generalized): Secondary | ICD-10-CM

## 2017-06-28 DIAGNOSIS — R2689 Other abnormalities of gait and mobility: Secondary | ICD-10-CM

## 2017-06-28 DIAGNOSIS — G8929 Other chronic pain: Secondary | ICD-10-CM

## 2017-06-28 DIAGNOSIS — M545 Low back pain: Secondary | ICD-10-CM | POA: Diagnosis not present

## 2017-06-29 ENCOUNTER — Ambulatory Visit: Payer: Medicare Other | Admitting: Physical Therapy

## 2017-06-29 NOTE — Therapy (Signed)
[place] MAIN [facility] SERVICES [address] [place], [place], [address] Phone: [phone]   Fax:  [phone]  Physical Therapy Treatment  Patient Details  Name: Wesley Newton MRN: [id number] Date of Birth: 24-Apr-1927 Referring Provider: Era Bumpers MD  Encounter Date: 06/28/2017      PT End of Session - 06/29/17 0814    Visit Number 10   Number of Visits 20   Date for PT Re-Evaluation 07/24/17   Authorization - Visit Number 4   Authorization - Number of Visits 10   PT Start Time 6269   PT Stop Time 1600   PT Time Calculation (min) 45 min   Equipment Utilized During Treatment Gait belt   Activity Tolerance Patient tolerated treatment well;Treatment limited secondary to medical complications (Comment);Patient limited by fatigue;Patient limited by pain   Behavior During Therapy [facility] for tasks assessed/performed      Past Medical History:  Diagnosis Date  . BPH (benign prostatic hyperplasia)   . Cancer ([place])   . COPD (chronic obstructive pulmonary disease) ([place])   . DVT of leg (deep venous thrombosis) ([place])   . Elevated PSA   . Emphysema lung ([place])   . Hyperlipidemia   . Nocturia   . Pulmonary fibrosis ([place])   . Skin cancer     Past Surgical History:  Procedure Laterality Date  . CATARACT EXTRACTION    . HERNIA REPAIR    . TONSILLECTOMY      Vitals:   06/28/17 1517  BP: 128/63  Pulse: 70  Resp: (!) 97        Subjective Assessment - 06/28/17 1517    Subjective Pt. is having a bad day, more dizziness, more headache, more hip pain.   Pertinent History Pt. is a pleasant 83 year old male who was referred to physical therapy for lumbar DDD. He reports having back pain for years. Pt. reports that he has a history of bulging discs, had therapy before and the back pain went away. Recently (in 5+ years) patients coughing/emphysema started becoming worse and caused back pain to return. He had pulmonary therapy about 2 years ago.  He states the pain is worse when trying to get up from a flat position. He can improve with ambulation. He denies any worsening with flexion-extension movements. He states he has intermittent pain that radiates along his thighs and to his hips. He also complains of stiffness and pain in his shoulder elbow and left lateral rib cage. He states he has intermittent numbness in his thighs as well. He denies any loss of bowel bladder function. He has not had prior lumbar spine surgery. He has not had any epidural steroid injections. He reports that his legs are feeling weaker and that he had difficulty finishing shaving this morning due to weakness. He enjoys puzzles and currently lives in [place] since 2008.    Limitations Standing;Walking;House hold activities;Lifting   How long can you sit comfortably? n/a   How long can you stand comfortably? less than 8 minutes   How long can you walk comfortably? 8-15 minutes   Diagnostic tests slump test, knees to chest, 6 min walk, Berg   Patient Stated Goals decrease pain, improve walking   Currently in Pain? Yes   Pain Score 3    Pain Location Hip   Pain Orientation Left   Pain Descriptors / Indicators Aching   Pain Type Chronic pain   Pain Onset More than a month ago  Pain Frequency Intermittent   Aggravating Factors  getting up        TherEx Purse lipped breathing semi reclined Purse lipped breathing arm circles seated Purse lipped breathing multiple trials throughout session to regain control of breathing cycles.  Forward and backwards stepping in // bars with UE support.  TA contractions with swiss ball x 20 TA contraction 2x 12x Thoracic LE hooklying rotations to decrease low back pain 10x Nustep lvl 4 6 minutes  Manual Semi reclines:Hamstring PROM 60 second holds with df overpressure, popliteal angle 60 seconds, IR, ER x 45 seconds LAD 6x20 seconds     Neuro Re-ed VOR1 x30 seconds, increased dizziness by 3 points Airex balance  throwing balls into basket x20 balls Eyes closed balance 60 seconds Tapping stepping stones blue green pink with PT verbally directing Passing rainbow ball outside of BOS for multiple trials      Pt. Required frequent rest breaks and verbal cues for breathing. Occasional LOB to the right >left.     Patient will continue to benefit from skilled physical therapy to improve pain levels, improve mobility and safety while ambulating, and to strengthen pt. To allow for return to prior level of function to safely interact with natural environment          PT Education - 06/28/17 1512    Education provided Yes   Education Details safe biomechanics for mobility   Person(s) Educated Patient   Methods Explanation;Demonstration;Verbal cues   Comprehension Verbalized understanding;Returned demonstration             PT Long Term Goals - 06/12/17 1639      PT LONG TERM GOAL #1   Title Patient will increase Berg Balance score by > 6 points (51) to demonstrate decreased fall risk during functional activities.   Baseline 6/25: 50/56; 05/19/17: 45   Time 6   Period Weeks   Status On-going     PT LONG TERM GOAL #2   Title Patient will increase six minute walk test distance to >1000 for progression to community ambulator and improve gait ability   Baseline 6/25: 1133 ft; 6/1: 830 ft   Time 6   Period Weeks   Status Achieved     PT LONG TERM GOAL #3   Title Patient will reduce modified Oswestry score to <32 as to demonstrate decreased disability with ADLs including improved sleeping tolerance, walking/sitting tolerance etc for better mobility with ADLs.    Baseline 6/25: 38%; 6/1: 48%: Severe disabiilty   Time 6   Period Weeks   Status On-going     PT LONG TERM GOAL #4   Title Patient will decrease FABQ score by 8 points (58/96) for decreased perception of disability and improved quality of life.    Baseline 6/25: 50/96; 6/1: 66/96   Time 6   Period Weeks   Status Achieved      PT LONG TERM GOAL #5   Title Patient will be independent in home exercise program to improve strength/mobility for better functional independence with ADLs.   Baseline 6/1: given HEP   Time 6   Period Weeks   Status On-going     Additional Long Term Goals   Additional Long Term Goals Yes     PT LONG TERM GOAL #6   Title Patient will decrease FABQ score by 8 points (42/96) for decreased perception of disability and improved quality of life.    Baseline 6/25: 50/96   Time 6   Period Weeks  Status New     PT LONG TERM GOAL #7   Title Patient will perform single limb stance >10 seconds bilaterally for improved stability while performing functional tasks.    Baseline 6/25: L: 4 seconds, R: 18 seconds   Time 6   Period Weeks   Status New               Plan - 06/29/17 0815    Clinical Impression Statement Patient limited in treatment today by fatigue and shortness of breath. Dizziness during standing activities was worse than prior sessions and pt. C/o of increased back pain limiting standing interventions. Patient had decreased pain after manual and was able to perform some light neuro interventions with frequent breaks.  Patient will continue to benefit from skilled physical therapy to improve pain levels, improve mobility and safety while ambulating, and to strengthen pt. To allow for return to prior level of function to safely interact with natural environment   Rehab Potential Fair   Clinical Impairments Affecting Rehab Potential memory, chronicity   PT Frequency 2x / week   PT Duration 6 weeks   PT Treatment/Interventions ADLs/Self Care Home Management;Aquatic Therapy;Canalith Repostioning;Cryotherapy;Electrical Stimulation;Iontophoresis 4mg /ml Dexamethasone;Moist Heat;Traction;Ultrasound;DME Instruction;Gait training;Stair training;Balance training;Therapeutic exercise;Therapeutic activities;Functional mobility training;Neuromuscular re-education;Patient/family  education;Manual techniques;Passive range of motion;Vestibular;Energy conservation   PT Next Visit Plan recheck goals. assess to cardiovascular clinic?   PT Home Exercise Plan see sheet   Consulted and Agree with Plan of Care Patient      Patient will benefit from skilled therapeutic intervention in order to improve the following deficits and impairments:  Abnormal gait, Cardiopulmonary status limiting activity, Decreased activity tolerance, Decreased balance, Decreased coordination, Decreased endurance, Decreased knowledge of precautions, Decreased safety awareness, Decreased knowledge of use of DME, Decreased mobility, Decreased strength, Difficulty walking, Dizziness, Impaired flexibility, Impaired perceived functional ability, Increased muscle spasms, Postural dysfunction, Improper body mechanics, Pain  Visit Diagnosis: Chronic bilateral low back pain, with sciatica presence unspecified  Other abnormalities of gait and mobility  Muscle weakness (generalized)  Dizziness and giddiness     Problem List Patient Active Problem List   Diagnosis Date Noted  . PAD (peripheral artery disease) ([place]) 04/25/2017  . BPH with obstruction/lower urinary tract symptoms 01/10/2016  . Nocturia 01/10/2016  . Benign prostatic hyperplasia with urinary obstruction 01/10/2016  . AB (asthmatic bronchitis) 12/17/2014  . Carotid artery disease ([place]) 12/04/2014  . Acid reflux 12/04/2014  . H/O respiratory system disease 12/04/2014  . H/O peptic ulcer 12/04/2014  . Hypercholesterolemia 12/04/2014  . Unstable angina pectoris ([place]) 12/04/2014  . Amnesia 12/04/2014  . Chronic obstructive pulmonary disease ([place]) 06/11/2014  . Cephalalgia 06/11/2014  . BP (high blood pressure) 06/11/2014   Janna Arch, PT, DPT Janna Arch, PT, DPT    Janna Arch 06/29/2017, 8:18 AM  [place] MAIN [facility] SERVICES [address] [place], [place], [address] Phone: [phone]    Fax:  [phone]  Name: LAMBERT JEANTY MRN: [id number] Date of Birth: [birth year]

## 2017-07-03 ENCOUNTER — Ambulatory Visit: Payer: Medicare Other

## 2017-07-03 DIAGNOSIS — R2689 Other abnormalities of gait and mobility: Secondary | ICD-10-CM

## 2017-07-03 DIAGNOSIS — M545 Low back pain: Secondary | ICD-10-CM | POA: Diagnosis not present

## 2017-07-03 DIAGNOSIS — R42 Dizziness and giddiness: Secondary | ICD-10-CM

## 2017-07-03 DIAGNOSIS — G8929 Other chronic pain: Secondary | ICD-10-CM

## 2017-07-03 DIAGNOSIS — M6281 Muscle weakness (generalized): Secondary | ICD-10-CM

## 2017-07-03 NOTE — Therapy (Signed)
[place] MAIN [facility] SERVICES [address] [place], [place], [address] Phone: [phone]   Fax:  [phone]  Physical Therapy Treatment  Patient Details  Name: Wesley Newton MRN: [id number] Date of Birth: 12/09/1927 Referring Provider: Era Bumpers MD  Encounter Date: 07/03/2017      PT End of Session - 07/03/17 1202    Visit Number 11   Number of Visits 20   Date for PT Re-Evaluation 07/24/17   Authorization - Visit Number 5   Authorization - Number of Visits 10   PT Start Time 3151   PT Stop Time 0947   PT Time Calculation (min) 48 min   Equipment Utilized During Treatment Gait belt   Activity Tolerance Patient tolerated treatment well;Treatment limited secondary to medical complications (Comment);Patient limited by fatigue   Behavior During Therapy [facility] for tasks assessed/performed      Past Medical History:  Diagnosis Date  . BPH (benign prostatic hyperplasia)   . Cancer ([place])   . COPD (chronic obstructive pulmonary disease) ([place])   . DVT of leg (deep venous thrombosis) ([place])   . Elevated PSA   . Emphysema lung ([place])   . Hyperlipidemia   . Nocturia   . Pulmonary fibrosis ([place])   . Skin cancer     Past Surgical History:  Procedure Laterality Date  . CATARACT EXTRACTION    . HERNIA REPAIR    . TONSILLECTOMY      There were no vitals filed for this visit. TherEx Purse lipped breathing seated Purse lipped breathing arm circles seated Purse lipped breathing multiple trials throughout session to regain control of breathing cycles b/w exercises Nustep lvl 5 5 minutes Step up and down 6" step in // bars  Side step up and down 3" step, 6" step painful 16x each side Step up and over 3" step repeated trials, with LLE because  Seated hamstring stretch with sheet x 30 seconds      Neuro Re-ed  Airex balance no UE assist x 30 seconds Airex balance throwing balls into basket x20 balls airex tandem balance throwing balls  into basket x 20 balls Eyes closed balance 60 seconds     Pt. Required frequent rest breaks and verbal cues for breathing.      Patient will continue to benefit from skilled physical therapy to improve pain levels, improve mobility and safety while ambulating, and to strengthen pt. To allow for return to prior level of function to safely interact with natural environment         PT Long Term Goals - 06/12/17 1639      PT LONG TERM GOAL #1   Title Patient will increase Berg Balance score by > 6 points (51) to demonstrate decreased fall risk during functional activities.   Baseline 6/25: 50/56; 05/19/17: 45   Time 6   Period Weeks   Status On-going     PT LONG TERM GOAL #2   Title Patient will increase six minute walk test distance to >1000 for progression to community ambulator and improve gait ability   Baseline 6/25: 1133 ft; 6/1: 830 ft   Time 6   Period Weeks   Status Achieved     PT LONG TERM GOAL #3   Title Patient will reduce modified Oswestry score to <32 as to demonstrate decreased disability with ADLs including improved sleeping tolerance, walking/sitting tolerance etc for better mobility with ADLs.    Baseline 6/25: 38%; 6/1: 48%: Severe disabiilty  Time 6   Period Weeks   Status On-going     PT LONG TERM GOAL #4   Title Patient will decrease FABQ score by 8 points (58/96) for decreased perception of disability and improved quality of life.    Baseline 6/25: 50/96; 6/1: 66/96   Time 6   Period Weeks   Status Achieved     PT LONG TERM GOAL #5   Title Patient will be independent in home exercise program to improve strength/mobility for better functional independence with ADLs.   Baseline 6/1: given HEP   Time 6   Period Weeks   Status On-going     Additional Long Term Goals   Additional Long Term Goals Yes     PT LONG TERM GOAL #6   Title Patient will decrease FABQ score by 8 points (42/96) for decreased perception of disability and improved quality of  life.    Baseline 6/25: 50/96   Time 6   Period Weeks   Status New     PT LONG TERM GOAL #7   Title Patient will perform single limb stance >10 seconds bilaterally for improved stability while performing functional tasks.    Baseline 6/25: L: 4 seconds, R: 18 seconds   Time 6   Period Weeks   Status New               Plan - 07/03/17 1204    Clinical Impression Statement Patient presented with decreased dizziness allowing for progression of standing tasks. Steps are painful for patient at home and so initialization of smaller steps was performed with no report of pain. Balance continues to progress with pt. Requiring less cues for upright posture. Constant cueing for breathing required as pt. Tends to hold breathe.   Patient will continue to benefit from skilled physical therapy to improve pain levels, improve mobility and safety while ambulating, and to strengthen pt. To allow for return to prior level of function to safely interact with natural environment   Rehab Potential Fair   Clinical Impairments Affecting Rehab Potential memory, chronicity   PT Frequency 2x / week   PT Duration 6 weeks   PT Treatment/Interventions ADLs/Self Care Home Management;Aquatic Therapy;Canalith Repostioning;Cryotherapy;Electrical Stimulation;Iontophoresis 4mg /ml Dexamethasone;Moist Heat;Traction;Ultrasound;DME Instruction;Gait training;Stair training;Balance training;Therapeutic exercise;Therapeutic activities;Functional mobility training;Neuromuscular re-education;Patient/family education;Manual techniques;Passive range of motion;Vestibular;Energy conservation   PT Next Visit Plan recheck goals. assess to cardiovascular clinic?   PT Home Exercise Plan see sheet   Consulted and Agree with Plan of Care Patient      Patient will benefit from skilled therapeutic intervention in order to improve the following deficits and impairments:  Abnormal gait, Cardiopulmonary status limiting activity, Decreased  activity tolerance, Decreased balance, Decreased coordination, Decreased endurance, Decreased knowledge of precautions, Decreased safety awareness, Decreased knowledge of use of DME, Decreased mobility, Decreased strength, Difficulty walking, Dizziness, Impaired flexibility, Impaired perceived functional ability, Increased muscle spasms, Postural dysfunction, Improper body mechanics, Pain  Visit Diagnosis: Chronic bilateral low back pain, with sciatica presence unspecified  Other abnormalities of gait and mobility  Muscle weakness (generalized)  Dizziness and giddiness     Problem List Patient Active Problem List   Diagnosis Date Noted  . PAD (peripheral artery disease) ([place]) 04/25/2017  . BPH with obstruction/lower urinary tract symptoms 01/10/2016  . Nocturia 01/10/2016  . Benign prostatic hyperplasia with urinary obstruction 01/10/2016  . AB (asthmatic bronchitis) 12/17/2014  . Carotid artery disease ([place]) 12/04/2014  . Acid reflux 12/04/2014  . H/O respiratory system disease 12/04/2014  .  H/O peptic ulcer 12/04/2014  . Hypercholesterolemia 12/04/2014  . Unstable angina pectoris ([place]) 12/04/2014  . Amnesia 12/04/2014  . Chronic obstructive pulmonary disease ([place]) 06/11/2014  . Cephalalgia 06/11/2014  . BP (high blood pressure) 06/11/2014   Janna Arch, PT, DPT   Janna Arch 07/03/2017, 12:06 PM  [place] MAIN [facility] SERVICES [address] [place], [place], [address] Phone: [phone]   Fax:  [phone]  Name: Wesley Newton MRN: [id number] Date of Birth: 1927-10-28

## 2017-07-05 ENCOUNTER — Ambulatory Visit: Payer: Medicare Other

## 2017-07-05 DIAGNOSIS — R2689 Other abnormalities of gait and mobility: Secondary | ICD-10-CM

## 2017-07-05 DIAGNOSIS — M6281 Muscle weakness (generalized): Secondary | ICD-10-CM

## 2017-07-05 DIAGNOSIS — M545 Low back pain: Secondary | ICD-10-CM | POA: Diagnosis not present

## 2017-07-05 DIAGNOSIS — R42 Dizziness and giddiness: Secondary | ICD-10-CM

## 2017-07-05 DIAGNOSIS — G8929 Other chronic pain: Secondary | ICD-10-CM

## 2017-07-05 NOTE — Therapy (Signed)
[place] MAIN [facility] SERVICES [address] [place], [place], [address] Phone: [phone]   Fax:  [phone]  Physical Therapy Treatment  Patient Details  Name: Wesley Newton MRN: [id number] Date of Birth: 01-31-1927 Referring Provider: Era Bumpers MD  Encounter Date: 07/05/2017      PT End of Session - 07/05/17 1217    Visit Number 12   Number of Visits 20   Date for PT Re-Evaluation 07/24/17   Authorization - Visit Number 6   Authorization - Number of Visits 10   PT Start Time 4315   PT Stop Time 1115   PT Time Calculation (min) 46 min   Equipment Utilized During Treatment Gait belt   Activity Tolerance Patient tolerated treatment well;Treatment limited secondary to medical complications (Comment);Patient limited by fatigue   Behavior During Therapy [facility] for tasks assessed/performed      Past Medical History:  Diagnosis Date  . BPH (benign prostatic hyperplasia)   . Cancer ([place])   . COPD (chronic obstructive pulmonary disease) ([place])   . DVT of leg (deep venous thrombosis) ([place])   . Elevated PSA   . Emphysema lung ([place])   . Hyperlipidemia   . Nocturia   . Pulmonary fibrosis ([place])   . Skin cancer     Past Surgical History:  Procedure Laterality Date  . CATARACT EXTRACTION    . HERNIA REPAIR    . TONSILLECTOMY      There were no vitals filed for this visit.     Subjective Assessment - 07/05/17 1033    Subjective Patient is having a bad day, feeling weaker and coughing more this morning.    Pertinent History Pt. is a pleasant 83 year old male who was referred to physical therapy for lumbar DDD. He reports having back pain for years. Pt. reports that he has a history of bulging discs, had therapy before and the back pain went away. Recently (in 5+ years) patients coughing/emphysema started becoming worse and caused back pain to return. He had pulmonary therapy about 2 years ago. He states the pain is worse when trying to get  up from a flat position. He can improve with ambulation. He denies any worsening with flexion-extension movements. He states he has intermittent pain that radiates along his thighs and to his hips. He also complains of stiffness and pain in his shoulder elbow and left lateral rib cage. He states he has intermittent numbness in his thighs as well. He denies any loss of bowel bladder function. He has not had prior lumbar spine surgery. He has not had any epidural steroid injections. He reports that his legs are feeling weaker and that he had difficulty finishing shaving this morning due to weakness. He enjoys puzzles and currently lives in [place] since 2008.    Limitations Standing;Walking;House hold activities;Lifting   How long can you sit comfortably? n/a   How long can you stand comfortably? less than 8 minutes   How long can you walk comfortably? 8-15 minutes   Diagnostic tests slump test, knees to chest, 6 min walk, Berg   Patient Stated Goals decrease pain, improve walking   Currently in Pain? Yes   Pain Score 5    Pain Location Back   Pain Orientation Left   Pain Descriptors / Indicators Aching   Pain Type Chronic pain   Pain Onset More than a month ago    manual Semi reclined hamstring stretch prolonged 2x60 seconds, hamstring stretch with  df overpressure 60 seconds, popliteal angle x60 seconds, IR, ER x 60 seconds Trunk rotations in hooklying x60 seconds SAD in semi reclined position with belt. 8x 20 second holds with multiple angles of pull  TherEx single knee to chest 2x60 seconds in semi reclined position TA contractions in semi reclined 20x  TA contraction with swiss ball 20x Toe taps 6" step no UE assist, 20x, no LOB Purse lipped breathing in seated position between interventions  Neuro Re-ed Airex pad: balance: 45 sec, horizontal head turns 60 seconds, vertical head turns 60 seconds, more sway and dizziness with vertical head turns Tandem stance on solid ground 60  seconds each, no LOB Half foam roller 70 seconds, occasional forward LOB 3 stepping stone taps with PT directing color and leg. Pt. Progressing with less frequent LOB when performing cross body sequence.  Hip extension in standing in // bars 10x each leg, hand on chest to promote upright posture Heel raises 10x      Pt. Required frequent rest breaks and verbal cues for breathing.      Patient will continue to benefit from skilled physical therapy to improve pain levels, improve mobility and safety while ambulating, and to strengthen pt. To allow for return to prior level of function to safely interact with natural environment         PT Long Term Goals - 06/12/17 1639      PT LONG TERM GOAL #1   Title Patient will increase Berg Balance score by > 6 points (51) to demonstrate decreased fall risk during functional activities.   Baseline 6/25: 50/56; 05/19/17: 45   Time 6   Period Weeks   Status On-going     PT LONG TERM GOAL #2   Title Patient will increase six minute walk test distance to >1000 for progression to community ambulator and improve gait ability   Baseline 6/25: 1133 ft; 6/1: 830 ft   Time 6   Period Weeks   Status Achieved     PT LONG TERM GOAL #3   Title Patient will reduce modified Oswestry score to <32 as to demonstrate decreased disability with ADLs including improved sleeping tolerance, walking/sitting tolerance etc for better mobility with ADLs.    Baseline 6/25: 38%; 6/1: 48%: Severe disabiilty   Time 6   Period Weeks   Status On-going     PT LONG TERM GOAL #4   Title Patient will decrease FABQ score by 8 points (58/96) for decreased perception of disability and improved quality of life.    Baseline 6/25: 50/96; 6/1: 66/96   Time 6   Period Weeks   Status Achieved     PT LONG TERM GOAL #5   Title Patient will be independent in home exercise program to improve strength/mobility for better functional independence with ADLs.   Baseline 6/1: given HEP    Time 6   Period Weeks   Status On-going     Additional Long Term Goals   Additional Long Term Goals Yes     PT LONG TERM GOAL #6   Title Patient will decrease FABQ score by 8 points (42/96) for decreased perception of disability and improved quality of life.    Baseline 6/25: 50/96   Time 6   Period Weeks   Status New     PT LONG TERM GOAL #7   Title Patient will perform single limb stance >10 seconds bilaterally for improved stability while performing functional tasks.    Baseline 6/25: L: 4  seconds, R: 18 seconds   Time 6   Period Weeks   Status New               Plan - 07/05/17 1218    Clinical Impression Statement Pt. Presents with tightened posterior LE musculature leading to increased lower back pain. Pain decreased with manual. Balance is progressing with decreased LOB with single limb tasks. Patient will continue to benefit from skilled physical therapy to improve pain levels, improve mobility and safety while ambulating, and to strengthen pt. To allow for return to prior level of function to safely interact with natural environment   Rehab Potential Fair   Clinical Impairments Affecting Rehab Potential memory, chronicity   PT Frequency 2x / week   PT Duration 6 weeks   PT Treatment/Interventions ADLs/Self Care Home Management;Aquatic Therapy;Canalith Repostioning;Cryotherapy;Electrical Stimulation;Iontophoresis 4mg /ml Dexamethasone;Moist Heat;Traction;Ultrasound;DME Instruction;Gait training;Stair training;Balance training;Therapeutic exercise;Therapeutic activities;Functional mobility training;Neuromuscular re-education;Patient/family education;Manual techniques;Passive range of motion;Vestibular;Energy conservation   PT Next Visit Plan recheck goals. assess to cardiovascular clinic?   PT Home Exercise Plan see sheet   Consulted and Agree with Plan of Care Patient      Patient will benefit from skilled therapeutic intervention in order to improve the following  deficits and impairments:  Abnormal gait, Cardiopulmonary status limiting activity, Decreased activity tolerance, Decreased balance, Decreased coordination, Decreased endurance, Decreased knowledge of precautions, Decreased safety awareness, Decreased knowledge of use of DME, Decreased mobility, Decreased strength, Difficulty walking, Dizziness, Impaired flexibility, Impaired perceived functional ability, Increased muscle spasms, Postural dysfunction, Improper body mechanics, Pain  Visit Diagnosis: Chronic bilateral low back pain, with sciatica presence unspecified  Other abnormalities of gait and mobility  Muscle weakness (generalized)  Dizziness and giddiness     Problem List Patient Active Problem List   Diagnosis Date Noted  . PAD (peripheral artery disease) ([place]) 04/25/2017  . BPH with obstruction/lower urinary tract symptoms 01/10/2016  . Nocturia 01/10/2016  . Benign prostatic hyperplasia with urinary obstruction 01/10/2016  . AB (asthmatic bronchitis) 12/17/2014  . Carotid artery disease ([place]) 12/04/2014  . Acid reflux 12/04/2014  . H/O respiratory system disease 12/04/2014  . H/O peptic ulcer 12/04/2014  . Hypercholesterolemia 12/04/2014  . Unstable angina pectoris ([place]) 12/04/2014  . Amnesia 12/04/2014  . Chronic obstructive pulmonary disease ([place]) 06/11/2014  . Cephalalgia 06/11/2014  . BP (high blood pressure) 06/11/2014   Janna Arch, PT, DPT   Janna Arch 07/05/2017, 12:19 PM  [place] MAIN [facility] SERVICES [address] [place], [place], [address] Phone: [phone]   Fax:  [phone]  Name: Wesley Newton MRN: [id number] Date of Birth: 1927/07/03

## 2017-07-06 ENCOUNTER — Ambulatory Visit: Payer: Medicare Other | Admitting: Physical Therapy

## 2017-07-10 ENCOUNTER — Ambulatory Visit: Payer: Medicare Other

## 2017-07-10 DIAGNOSIS — R42 Dizziness and giddiness: Secondary | ICD-10-CM

## 2017-07-10 DIAGNOSIS — M6281 Muscle weakness (generalized): Secondary | ICD-10-CM

## 2017-07-10 DIAGNOSIS — R2689 Other abnormalities of gait and mobility: Secondary | ICD-10-CM

## 2017-07-10 DIAGNOSIS — G8929 Other chronic pain: Secondary | ICD-10-CM

## 2017-07-10 DIAGNOSIS — M545 Low back pain: Principal | ICD-10-CM

## 2017-07-10 NOTE — Therapy (Signed)
[place] MAIN [facility] SERVICES [address] [place], [place], [address] Phone: [phone]   Fax:  [phone]  Physical Therapy Treatment  Patient Details  Name: Wesley Newton MRN: [id number] Date of Birth: 03/19/1927 Referring Provider: Era Bumpers MD  Encounter Date: 07/10/2017      PT End of Session - 07/10/17 1107    Visit Number 13   Number of Visits 20   Date for PT Re-Evaluation 07/24/17   Authorization - Visit Number 7   Authorization - Number of Visits 10   PT Start Time 1059   PT Stop Time 7026   PT Time Calculation (min) 46 min   Equipment Utilized During Treatment Gait belt   Activity Tolerance Patient tolerated treatment well;Treatment limited secondary to medical complications (Comment);Patient limited by fatigue   Behavior During Therapy [facility] for tasks assessed/performed      Past Medical History:  Diagnosis Date  . BPH (benign prostatic hyperplasia)   . Cancer ([place])   . COPD (chronic obstructive pulmonary disease) ([place])   . DVT of leg (deep venous thrombosis) ([place])   . Elevated PSA   . Emphysema lung ([place])   . Hyperlipidemia   . Nocturia   . Pulmonary fibrosis ([place])   . Skin cancer     Past Surgical History:  Procedure Laterality Date  . CATARACT EXTRACTION    . HERNIA REPAIR    . TONSILLECTOMY      There were no vitals filed for this visit.      Subjective Assessment - 07/10/17 1104    Subjective Patient is having a hard time breathing and feeling some more aches from the weather.    Pertinent History Pt. is a pleasant 83 year old male who was referred to physical therapy for lumbar DDD. He reports having back pain for years. Pt. reports that he has a history of bulging discs, had therapy before and the back pain went away. Recently (in 5+ years) patients coughing/emphysema started becoming worse and caused back pain to return. He had pulmonary therapy about 2 years ago. He states the pain is worse when  trying to get up from a flat position. He can improve with ambulation. He denies any worsening with flexion-extension movements. He states he has intermittent pain that radiates along his thighs and to his hips. He also complains of stiffness and pain in his shoulder elbow and left lateral rib cage. He states he has intermittent numbness in his thighs as well. He denies any loss of bowel bladder function. He has not had prior lumbar spine surgery. He has not had any epidural steroid injections. He reports that his legs are feeling weaker and that he had difficulty finishing shaving this morning due to weakness. He enjoys puzzles and currently lives in [place] since 2008.    Limitations Standing;Walking;House hold activities;Lifting   How long can you sit comfortably? n/a   How long can you stand comfortably? less than 8 minutes   How long can you walk comfortably? 8-15 minutes   Diagnostic tests slump test, knees to chest, 6 min walk, Berg   Patient Stated Goals decrease pain, improve walking   Currently in Pain? Yes   Pain Score 4    Pain Location Back   Pain Orientation Left   Pain Descriptors / Indicators Aching   Pain Type Chronic pain   Pain Onset More than a month ago   Pain Frequency Intermittent   Aggravating Factors  sitting  too long   Pain Relieving Factors moving around longer     manual Semi reclined hamstring stretch prolonged 2x60 seconds, hamstring stretch with df overpressure 60 seconds, popliteal angle x60 seconds, IR, ER x 60 seconds Trunk rotations in hooklying x60 seconds    TherEx Nustep Lvl 5 6 minutes single knee to chest 2x60 seconds in semi reclined position Puse lipped breathing semi reclined position Purse lipped breathing in seated position between interventions Seated marching against RTB resistance with focus on maintaining upright seated posture 2x20 Seated abduction with RTB 2 x 20 with control of upright posture Seated adduction with Green ball  2x20 Resisted pf RTB x 20 each leg Airex pad: balance: 45 sec, horizontal head turns 60 seconds. Excessive dizziness from difficulty breathing    Pt. Required frequent rest breaks and verbal cues for breathing.      Patient will continue to benefit from skilled physical therapy to improve pain levels, improve mobility and safety while ambulating, and to strengthen pt. To allow for return to prior level of function to safely interact with natural environment          PT Education - 07/10/17 1106    Education provided Yes   Education Details importance of moving around/standing up when sittting too longer to prevent back pain   Person(s) Educated Patient   Methods Explanation   Comprehension Verbalized understanding             PT Long Term Goals - 06/12/17 1639      PT LONG TERM GOAL #1   Title Patient will increase Berg Balance score by > 6 points (51) to demonstrate decreased fall risk during functional activities.   Baseline 6/25: 50/56; 05/19/17: 45   Time 6   Period Weeks   Status On-going     PT LONG TERM GOAL #2   Title Patient will increase six minute walk test distance to >1000 for progression to community ambulator and improve gait ability   Baseline 6/25: 1133 ft; 6/1: 830 ft   Time 6   Period Weeks   Status Achieved     PT LONG TERM GOAL #3   Title Patient will reduce modified Oswestry score to <32 as to demonstrate decreased disability with ADLs including improved sleeping tolerance, walking/sitting tolerance etc for better mobility with ADLs.    Baseline 6/25: 38%; 6/1: 48%: Severe disabiilty   Time 6   Period Weeks   Status On-going     PT LONG TERM GOAL #4   Title Patient will decrease FABQ score by 8 points (58/96) for decreased perception of disability and improved quality of life.    Baseline 6/25: 50/96; 6/1: 66/96   Time 6   Period Weeks   Status Achieved     PT LONG TERM GOAL #5   Title Patient will be independent in home exercise  program to improve strength/mobility for better functional independence with ADLs.   Baseline 6/1: given HEP   Time 6   Period Weeks   Status On-going     Additional Long Term Goals   Additional Long Term Goals Yes     PT LONG TERM GOAL #6   Title Patient will decrease FABQ score by 8 points (42/96) for decreased perception of disability and improved quality of life.    Baseline 6/25: 50/96   Time 6   Period Weeks   Status New     PT LONG TERM GOAL #7   Title Patient will perform single  limb stance >10 seconds bilaterally for improved stability while performing functional tasks.    Baseline 6/25: L: 4 seconds, R: 18 seconds   Time 6   Period Weeks   Status New               Plan - 07/10/17 1217    Clinical Impression Statement Increased cough and loss of breathe today. Dizziness in all activities was increased from difficulty breathing with pt. Requiring frequent seated rest breaks. Seated exercise was implemented due to exacerbations during standing. Patient will continue to benefit from skilled physical therapy to improve pain levels, improve mobility and safety while ambulating, and to strengthen pt. To allow for return to prior level of function to safely interact with natural environment   Rehab Potential Fair   Clinical Impairments Affecting Rehab Potential memory, chronicity   PT Frequency 2x / week   PT Duration 6 weeks   PT Treatment/Interventions ADLs/Self Care Home Management;Aquatic Therapy;Canalith Repostioning;Cryotherapy;Electrical Stimulation;Iontophoresis 4mg /ml Dexamethasone;Moist Heat;Traction;Ultrasound;DME Instruction;Gait training;Stair training;Balance training;Therapeutic exercise;Therapeutic activities;Functional mobility training;Neuromuscular re-education;Patient/family education;Manual techniques;Passive range of motion;Vestibular;Energy conservation   PT Next Visit Plan recheck goals. assess to cardiovascular clinic?   PT Home Exercise Plan see  sheet   Consulted and Agree with Plan of Care Patient      Patient will benefit from skilled therapeutic intervention in order to improve the following deficits and impairments:  Abnormal gait, Cardiopulmonary status limiting activity, Decreased activity tolerance, Decreased balance, Decreased coordination, Decreased endurance, Decreased knowledge of precautions, Decreased safety awareness, Decreased knowledge of use of DME, Decreased mobility, Decreased strength, Difficulty walking, Dizziness, Impaired flexibility, Impaired perceived functional ability, Increased muscle spasms, Postural dysfunction, Improper body mechanics, Pain  Visit Diagnosis: Chronic bilateral low back pain, with sciatica presence unspecified  Other abnormalities of gait and mobility  Muscle weakness (generalized)  Dizziness and giddiness     Problem List Patient Active Problem List   Diagnosis Date Noted  . PAD (peripheral artery disease) ([place]) 04/25/2017  . BPH with obstruction/lower urinary tract symptoms 01/10/2016  . Nocturia 01/10/2016  . Benign prostatic hyperplasia with urinary obstruction 01/10/2016  . AB (asthmatic bronchitis) 12/17/2014  . Carotid artery disease ([place]) 12/04/2014  . Acid reflux 12/04/2014  . H/O respiratory system disease 12/04/2014  . H/O peptic ulcer 12/04/2014  . Hypercholesterolemia 12/04/2014  . Unstable angina pectoris ([place]) 12/04/2014  . Amnesia 12/04/2014  . Chronic obstructive pulmonary disease ([place]) 06/11/2014  . Cephalalgia 06/11/2014  . BP (high blood pressure) 06/11/2014   Janna Arch, PT, DPT   Janna Arch 07/10/2017, 12:18 PM  [place] MAIN [facility] SERVICES [address] [place], [place], [address] Phone: [phone]   Fax:  [phone]  Name: ALCIDE MEMOLI MRN: [id number] Date of Birth: 1927-12-01

## 2017-07-12 ENCOUNTER — Ambulatory Visit: Payer: Medicare Other

## 2017-07-12 DIAGNOSIS — G8929 Other chronic pain: Secondary | ICD-10-CM

## 2017-07-12 DIAGNOSIS — R42 Dizziness and giddiness: Secondary | ICD-10-CM

## 2017-07-12 DIAGNOSIS — M6281 Muscle weakness (generalized): Secondary | ICD-10-CM

## 2017-07-12 DIAGNOSIS — R2689 Other abnormalities of gait and mobility: Secondary | ICD-10-CM

## 2017-07-12 DIAGNOSIS — M545 Low back pain: Secondary | ICD-10-CM | POA: Diagnosis not present

## 2017-07-12 NOTE — Therapy (Signed)
[place] MAIN [facility] SERVICES [address] [place], [place], [address] Phone: [phone]   Fax:  [phone]  Physical Therapy Treatment  Patient Details  Name: Wesley Newton MRN: [id number] Date of Birth: Nov 02, 1927 Referring Provider: Era Bumpers MD  Encounter Date: 07/12/2017      PT End of Session - 07/12/17 1206    Visit Number 14   Number of Visits 20   Date for PT Re-Evaluation 07/24/17   Authorization - Visit Number 8   Authorization - Number of Visits 10   PT Start Time 1115   PT Stop Time 1201   PT Time Calculation (min) 46 min   Equipment Utilized During Treatment Gait belt   Activity Tolerance Patient tolerated treatment well;Treatment limited secondary to medical complications (Comment)   Behavior During Therapy [facility] for tasks assessed/performed      Past Medical History:  Diagnosis Date  . BPH (benign prostatic hyperplasia)   . Cancer ([place])   . COPD (chronic obstructive pulmonary disease) ([place])   . DVT of leg (deep venous thrombosis) ([place])   . Elevated PSA   . Emphysema lung ([place])   . Hyperlipidemia   . Nocturia   . Pulmonary fibrosis ([place])   . Skin cancer     Past Surgical History:  Procedure Laterality Date  . CATARACT EXTRACTION    . HERNIA REPAIR    . TONSILLECTOMY      There were no vitals filed for this visit.      Subjective Assessment - 07/12/17 1116    Subjective Patient is having increased L hip pain in the mornings or when seated for too long.    Pertinent History Pt. is a pleasant 83 year old male who was referred to physical therapy for lumbar DDD. He reports having back pain for years. Pt. reports that he has a history of bulging discs, had therapy before and the back pain went away. Recently (in 5+ years) patients coughing/emphysema started becoming worse and caused back pain to return. He had pulmonary therapy about 2 years ago. He states the pain is worse when trying to get up from a flat  position. He can improve with ambulation. He denies any worsening with flexion-extension movements. He states he has intermittent pain that radiates along his thighs and to his hips. He also complains of stiffness and pain in his shoulder elbow and left lateral rib cage. He states he has intermittent numbness in his thighs as well. He denies any loss of bowel bladder function. He has not had prior lumbar spine surgery. He has not had any epidural steroid injections. He reports that his legs are feeling weaker and that he had difficulty finishing shaving this morning due to weakness. He enjoys puzzles and currently lives in [place] since 2008.    Limitations Standing;Walking;House hold activities;Lifting   How long can you sit comfortably? n/a   How long can you stand comfortably? less than 8 minutes   How long can you walk comfortably? 8-15 minutes   Diagnostic tests slump test, knees to chest, 6 min walk, Berg   Patient Stated Goals decrease pain, improve walking   Currently in Pain? Yes   Pain Score 2    Pain Location Hip   Pain Orientation Left   Pain Descriptors / Indicators Aching   Pain Type Chronic pain   Pain Onset More than a month ago   Pain Frequency Intermittent   Aggravating Factors  sitting too long  Pain Relieving Factors moving around     manual Semi reclined hamstring stretch prolonged 2x60 seconds, hamstring stretch with df overpressure 60 seconds, popliteal angle x60 seconds, IR, ER x 60 seconds Trunk rotations in hooklying x60 seconds     TherEx Nustep Lvl 5 6 minutes single knee to chest 2x60 seconds in semi reclined position Puse lipped breathing semi reclined position Purse lipped breathing in seated position between interventions TA contraction with marches x 20  Semi reclined position TA dead bugs with blue swiss ball x 20 semi reclined position  Weaving around 6 cones in hallway with occasional leftward LOB with patient catching self and regaining balance  with no assistance x 8. Figure 8 around 2 cones x 8 Standing in center of cones with PT directing which cone to circle around. Good control while ambulating, no LOB, occasional increase in dizziness    Neuro Re-ed Airex pad: balance: 45 sec, horizontal head turns 60 seconds.  Airex pad eyes closed 3x60 seconds decreased sway with repetition airex pad tandem stance x 60 x 2      Pt. Required frequent rest breaks and verbal cues for breathing.       Patient will continue to benefit from skilled physical therapy to improve pain levels, improve mobility and safety while ambulating, and to strengthen pt. To allow for return to prior level of function to safely interact with natural environment           PT Long Term Goals - 06/12/17 1639      PT LONG TERM GOAL #1   Title Patient will increase Berg Balance score by > 6 points (51) to demonstrate decreased fall risk during functional activities.   Baseline 6/25: 50/56; 05/19/17: 45   Time 6   Period Weeks   Status On-going     PT LONG TERM GOAL #2   Title Patient will increase six minute walk test distance to >1000 for progression to community ambulator and improve gait ability   Baseline 6/25: 1133 ft; 6/1: 830 ft   Time 6   Period Weeks   Status Achieved     PT LONG TERM GOAL #3   Title Patient will reduce modified Oswestry score to <32 as to demonstrate decreased disability with ADLs including improved sleeping tolerance, walking/sitting tolerance etc for better mobility with ADLs.    Baseline 6/25: 38%; 6/1: 48%: Severe disabiilty   Time 6   Period Weeks   Status On-going     PT LONG TERM GOAL #4   Title Patient will decrease FABQ score by 8 points (58/96) for decreased perception of disability and improved quality of life.    Baseline 6/25: 50/96; 6/1: 66/96   Time 6   Period Weeks   Status Achieved     PT LONG TERM GOAL #5   Title Patient will be independent in home exercise program to improve strength/mobility for  better functional independence with ADLs.   Baseline 6/1: given HEP   Time 6   Period Weeks   Status On-going     Additional Long Term Goals   Additional Long Term Goals Yes     PT LONG TERM GOAL #6   Title Patient will decrease FABQ score by 8 points (42/96) for decreased perception of disability and improved quality of life.    Baseline 6/25: 50/96   Time 6   Period Weeks   Status New     PT LONG TERM GOAL #7   Title Patient will  perform single limb stance >10 seconds bilaterally for improved stability while performing functional tasks.    Baseline 6/25: L: 4 seconds, R: 18 seconds   Time 6   Period Weeks   Status New               Plan - 07/12/17 1207    Clinical Impression Statement Patient light headed with transitions from semi reclined to sitting. Decreased antalgic gait with repeated ambulatory interventions. Back pain decreased with manual interventions. Patient will continue to benefit from skilled physical therapy to improve pain levels, improve mobility and safety while ambulating, and to strengthen pt. To allow for return to prior level of function to safely interact with natural environment   Rehab Potential Fair   Clinical Impairments Affecting Rehab Potential memory, chronicity   PT Frequency 2x / week   PT Duration 6 weeks   PT Treatment/Interventions ADLs/Self Care Home Management;Aquatic Therapy;Canalith Repostioning;Cryotherapy;Electrical Stimulation;Iontophoresis 4mg /ml Dexamethasone;Moist Heat;Traction;Ultrasound;DME Instruction;Gait training;Stair training;Balance training;Therapeutic exercise;Therapeutic activities;Functional mobility training;Neuromuscular re-education;Patient/family education;Manual techniques;Passive range of motion;Vestibular;Energy conservation   PT Next Visit Plan recheck goals. assess to cardiovascular clinic?   PT Home Exercise Plan see sheet   Consulted and Agree with Plan of Care Patient      Patient will benefit from  skilled therapeutic intervention in order to improve the following deficits and impairments:  Abnormal gait, Cardiopulmonary status limiting activity, Decreased activity tolerance, Decreased balance, Decreased coordination, Decreased endurance, Decreased knowledge of precautions, Decreased safety awareness, Decreased knowledge of use of DME, Decreased mobility, Decreased strength, Difficulty walking, Dizziness, Impaired flexibility, Impaired perceived functional ability, Increased muscle spasms, Postural dysfunction, Improper body mechanics, Pain  Visit Diagnosis: Chronic bilateral low back pain, with sciatica presence unspecified  Other abnormalities of gait and mobility  Muscle weakness (generalized)  Dizziness and giddiness     Problem List Patient Active Problem List   Diagnosis Date Noted  . PAD (peripheral artery disease) ([place]) 04/25/2017  . BPH with obstruction/lower urinary tract symptoms 01/10/2016  . Nocturia 01/10/2016  . Benign prostatic hyperplasia with urinary obstruction 01/10/2016  . AB (asthmatic bronchitis) 12/17/2014  . Carotid artery disease ([place]) 12/04/2014  . Acid reflux 12/04/2014  . H/O respiratory system disease 12/04/2014  . H/O peptic ulcer 12/04/2014  . Hypercholesterolemia 12/04/2014  . Unstable angina pectoris ([place]) 12/04/2014  . Amnesia 12/04/2014  . Chronic obstructive pulmonary disease ([place]) 06/11/2014  . Cephalalgia 06/11/2014  . BP (high blood pressure) 06/11/2014   Janna Arch, PT, DPT   Janna Arch 07/12/2017, 12:07 PM  [place] MAIN [facility] SERVICES [address] [place], [place], [address] Phone: [phone]   Fax:  [phone]  Name: Wesley Newton MRN: [id number] Date of Birth: 06-Jun-1927

## 2017-07-17 ENCOUNTER — Ambulatory Visit: Payer: Medicare Other

## 2017-07-17 DIAGNOSIS — G8929 Other chronic pain: Secondary | ICD-10-CM

## 2017-07-17 DIAGNOSIS — M6281 Muscle weakness (generalized): Secondary | ICD-10-CM

## 2017-07-17 DIAGNOSIS — R42 Dizziness and giddiness: Secondary | ICD-10-CM

## 2017-07-17 DIAGNOSIS — M545 Low back pain: Secondary | ICD-10-CM | POA: Diagnosis not present

## 2017-07-17 DIAGNOSIS — R2689 Other abnormalities of gait and mobility: Secondary | ICD-10-CM

## 2017-07-17 NOTE — Therapy (Signed)
[place] MAIN [facility] SERVICES [address] [place], [place], [address] Phone: [phone]   Fax:  [phone]  Physical Therapy Treatment  Patient Details  Name: Wesley Newton MRN: [id number] Date of Birth: 03-Feb-1927 Referring Provider: Era Bumpers MD  Encounter Date: 07/17/2017      PT End of Session - 07/17/17 1059    Visit Number 15   Number of Visits 20   Date for PT Re-Evaluation 07/24/17   Authorization - Visit Number 9   Authorization - Number of Visits 10   PT Start Time 1030   PT Stop Time 1115   PT Time Calculation (min) 45 min   Equipment Utilized During Treatment Gait belt   Activity Tolerance Patient tolerated treatment well;Treatment limited secondary to medical complications (Comment)   Behavior During Therapy [facility] for tasks assessed/performed      Past Medical History:  Diagnosis Date  . BPH (benign prostatic hyperplasia)   . Cancer ([place])   . COPD (chronic obstructive pulmonary disease) ([place])   . DVT of leg (deep venous thrombosis) ([place])   . Elevated PSA   . Emphysema lung ([place])   . Hyperlipidemia   . Nocturia   . Pulmonary fibrosis ([place])   . Skin cancer     Past Surgical History:  Procedure Laterality Date  . CATARACT EXTRACTION    . HERNIA REPAIR    . TONSILLECTOMY      There were no vitals filed for this visit.      Subjective Assessment - 07/17/17 1054    Subjective Patient is having L hip/back pain in the morning that decreases throughout the day. It is becoming more uncomfortable to stand on LLE for weight bearing . Patient feels like balance is getting better.    Pertinent History Pt. is a pleasant 83 year old male who was referred to physical therapy for lumbar DDD. He reports having back pain for years. Pt. reports that he has a history of bulging discs, had therapy before and the back pain went away. Recently (in 5+ years) patients coughing/emphysema started becoming worse and caused back pain  to return. He had pulmonary therapy about 2 years ago. He states the pain is worse when trying to get up from a flat position. He can improve with ambulation. He denies any worsening with flexion-extension movements. He states he has intermittent pain that radiates along his thighs and to his hips. He also complains of stiffness and pain in his shoulder elbow and left lateral rib cage. He states he has intermittent numbness in his thighs as well. He denies any loss of bowel bladder function. He has not had prior lumbar spine surgery. He has not had any epidural steroid injections. He reports that his legs are feeling weaker and that he had difficulty finishing shaving this morning due to weakness. He enjoys puzzles and currently lives in [place] since 2008.    Limitations Standing;Walking;House hold activities;Lifting   How long can you sit comfortably? n/a   How long can you stand comfortably? less than 8 minutes   How long can you walk comfortably? 8-15 minutes   Diagnostic tests slump test, knees to chest, 6 min walk, Berg   Patient Stated Goals decrease pain, improve walking   Currently in Pain? Yes   Pain Score 4    Pain Location Back   Pain Orientation Left   Pain Descriptors / Indicators Aching   Pain Type Chronic pain   Pain Radiating  Towards hip   Pain Onset More than a month ago   Pain Frequency Intermittent   Aggravating Factors  sitting to long   Pain Relieving Factors moving     BERG=53/56 MODI=52% FABQ: 20/30, 18/66, 38/96 Single limb stance: L 21, R 28 seconds. L cause pain  Posterior pelvic tilts 10x 5 second holds TA contraction 10x5 second holds Seated hamstring stretch 2x60 seconds Thoracic rotation twists in supine 2x60 seconds Supine hamstring stretch          PT Long Term Goals - 07/17/17 1059      PT LONG TERM GOAL #1   Title Patient will increase Berg Balance score by > 6 points (51) to demonstrate decreased fall risk during functional activities.    Baseline 7/30: 53/56; 6/25: 50/56; 05/19/17: 45   Time 6   Period Weeks   Status Achieved     PT LONG TERM GOAL #2   Title Patient will increase six minute walk test distance to >1000 for progression to community ambulator and improve gait ability   Baseline 6/25: 1133 ft; 6/1: 830 ft   Time 6   Period Weeks   Status Achieved     PT LONG TERM GOAL #3   Title Patient will reduce modified Oswestry score to <32 as to demonstrate decreased disability with ADLs including improved sleeping tolerance, walking/sitting tolerance etc for better mobility with ADLs.    Baseline 6/25: 38%; 6/1: 48%: Severe disabiilty, 52%   Time 6   Period Weeks   Status Not Met     PT LONG TERM GOAL #4   Title Patient will decrease FABQ score by 8 points (58/96) for decreased perception of disability and improved quality of life.    Baseline 7/30: 39/96; 6/25: 50/96; 6/1: 66/96   Time 6   Period Weeks   Status Achieved     PT LONG TERM GOAL #5   Title Patient will be independent in home exercise program to improve strength/mobility for better functional independence with ADLs.   Baseline 6/1: given HEP   Time 6   Period Weeks   Status Achieved     PT LONG TERM GOAL #6   Title Patient will decrease FABQ score by 8 points (42/96) for decreased perception of disability and improved quality of life.    Baseline 6/25: 50/96 7/30: 39/96   Time 6   Period Weeks   Status Achieved     PT LONG TERM GOAL #7   Title Patient will perform single limb stance >10 seconds bilaterally for improved stability while performing functional tasks.    Baseline 6/25: L: 4 seconds, R: 18 seconds 7/30: L 21, R 28 L painful   Time 6   Period Weeks   Status Achieved               Plan - 07/17/17 1244    Clinical Impression Statement Patient has progressed reaching most of his goals at this time and is ready for discharge. 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 Breathing limited ability to position pt. For treatment of back pain, also limits functional duration of exercise. Painful to weight bear solely upon LLE. BERG=53/56, MODI=52%, FABQ 38/96, Single limb stance >20 seconds bilaterally. At this time patient will be discharged and demonstrated understanding of independent HEP.  Suggest following up to see doctor about back pain.    Rehab Potential Fair   Clinical Impairments Affecting Rehab  Potential memory, chronicity   PT Frequency 2x / week   PT Duration 6 weeks   PT Treatment/Interventions ADLs/Self Care Home Management;Aquatic Therapy;Canalith Repostioning;Cryotherapy;Electrical Stimulation;Iontophoresis 14m/ml Dexamethasone;Moist Heat;Traction;Ultrasound;DME Instruction;Gait training;Stair training;Balance training;Therapeutic exercise;Therapeutic activities;Functional mobility training;Neuromuscular re-education;Patient/family education;Manual techniques;Passive range of motion;Vestibular;Energy conservation   PT Home Exercise Plan see sheet   Consulted and Agree with Plan of Care Patient      Patient will benefit from skilled therapeutic intervention in order to improve the following deficits and impairments:  Abnormal gait, Cardiopulmonary status limiting activity, Decreased activity tolerance, Decreased balance, Decreased coordination, Decreased endurance, Decreased knowledge of precautions, Decreased safety awareness, Decreased knowledge of use of DME, Decreased mobility, Decreased strength, Difficulty walking, Dizziness, Impaired flexibility, Impaired perceived functional ability, Increased muscle spasms, Postural dysfunction, Improper body mechanics, Pain  Visit Diagnosis: Chronic bilateral low back pain, with sciatica presence unspecified  Other abnormalities of gait and mobility  Muscle weakness (generalized)  Dizziness and giddiness       G-Codes - 008/05/181247    Functional Assessment Tool Used (Outpatient Only) FABQ,  MODI, BERG, SLS, 6 minute walk test, clinical judgement,   Functional Limitation Mobility: Walking and moving around   Mobility: Walking and Moving Around Current Status [phone] At least 20 percent but less than 40 percent impaired, limited or restricted   Mobility: Walking and Moving Around Goal Status [phone] At least 20 percent but less than 40 percent impaired, limited or restricted   Mobility: Walking and Moving Around Discharge Status [phone] At least 20 percent but less than 40 percent impaired, limited or restricted      Problem List Patient Active Problem List   Diagnosis Date Noted  . PAD (peripheral artery disease) ([place] 04/25/2017  . BPH with obstruction/lower urinary tract symptoms 01/10/2016  . Nocturia 01/10/2016  . Benign prostatic hyperplasia with urinary obstruction 01/10/2016  . AB (asthmatic bronchitis) 12/17/2014  . Carotid artery disease ([place] 12/04/2014  . Acid reflux 12/04/2014  . H/O respiratory system disease 12/04/2014  . H/O peptic ulcer 12/04/2014  . Hypercholesterolemia 12/04/2014  . Unstable angina pectoris ([place] 12/04/2014  . Amnesia 12/04/2014  . Chronic obstructive pulmonary disease ([place] 06/11/2014  . Cephalalgia 06/11/2014  . BP (high blood pressure) 06/11/2014   MJanna Arch PT, DPT   MJanna Arch708-04-2017 12:49 PM  [place] [facility] [address]RSar[address] [place] [address]Phone: [phone]  Fax:  [phone] Name: JAARYAN ESSMANMRN: [id number] of Birth: 1September 30, 1928

## 2017-07-19 ENCOUNTER — Ambulatory Visit: Payer: Medicare Other

## 2017-07-25 ENCOUNTER — Ambulatory Visit: Payer: Medicare Other

## 2017-07-28 ENCOUNTER — Ambulatory Visit: Payer: Medicare Other

## 2017-08-01 ENCOUNTER — Ambulatory Visit: Payer: Medicare Other

## 2017-08-01 DIAGNOSIS — IMO0002 Reserved for concepts with insufficient information to code with codable children: Secondary | ICD-10-CM | POA: Insufficient documentation

## 2017-08-04 ENCOUNTER — Ambulatory Visit: Payer: Medicare Other

## 2017-08-12 ENCOUNTER — Emergency Department
Admission: EM | Admit: 2017-08-12 | Discharge: 2017-08-12 | Disposition: A | Discharge status: Home / Self Care | Payer: Medicare Other | Attending: Emergency Medicine | Admitting: Emergency Medicine

## 2017-08-12 ENCOUNTER — Emergency Department: Payer: Medicare Other

## 2017-08-12 ENCOUNTER — Encounter: Payer: Self-pay | Admitting: Emergency Medicine

## 2017-08-12 DIAGNOSIS — J449 Chronic obstructive pulmonary disease, unspecified: Secondary | ICD-10-CM | POA: Insufficient documentation

## 2017-08-12 DIAGNOSIS — Z87891 Personal history of nicotine dependence: Secondary | ICD-10-CM | POA: Insufficient documentation

## 2017-08-12 DIAGNOSIS — R0602 Shortness of breath: Secondary | ICD-10-CM | POA: Diagnosis present

## 2017-08-12 DIAGNOSIS — R531 Weakness: Secondary | ICD-10-CM | POA: Diagnosis not present

## 2017-08-12 DIAGNOSIS — J45909 Unspecified asthma, uncomplicated: Secondary | ICD-10-CM | POA: Diagnosis not present

## 2017-08-12 DIAGNOSIS — Z7982 Long term (current) use of aspirin: Secondary | ICD-10-CM | POA: Insufficient documentation

## 2017-08-12 DIAGNOSIS — Z79899 Other long term (current) drug therapy: Secondary | ICD-10-CM | POA: Diagnosis not present

## 2017-08-12 HISTORY — DX: Emphysema, unspecified: J43.9

## 2017-08-12 HISTORY — DX: Bronchitis, not specified as acute or chronic: J40

## 2017-08-12 HISTORY — DX: Unspecified asthma, uncomplicated: J45.909

## 2017-08-12 LAB — URINALYSIS, COMPLETE (UACMP) WITH MICROSCOPIC
Bacteria, UA: NONE SEEN
Bilirubin Urine: NEGATIVE
GLUCOSE, UA: NEGATIVE mg/dL
Hgb urine dipstick: NEGATIVE
Ketones, ur: NEGATIVE mg/dL
Leukocytes, UA: NEGATIVE
Nitrite: NEGATIVE
PH: 6 (ref 5.0–8.0)
PROTEIN: NEGATIVE mg/dL
SPECIFIC GRAVITY, URINE: 1.004 — AB (ref 1.005–1.030)
SQUAMOUS EPITHELIAL / LPF: NONE SEEN

## 2017-08-12 LAB — BASIC METABOLIC PANEL
ANION GAP: 6 (ref 5–15)
BUN: 22 mg/dL — ABNORMAL HIGH (ref 6–20)
CALCIUM: 9.6 mg/dL (ref 8.9–10.3)
CO2: 28 mmol/L (ref 22–32)
Chloride: 103 mmol/L (ref 101–111)
Creatinine, Ser: 1.42 mg/dL — ABNORMAL HIGH (ref 0.61–1.24)
GFR, EST AFRICAN AMERICAN: 49 mL/min — AB (ref >60)
GFR, EST NON AFRICAN AMERICAN: 43 mL/min — AB (ref >60)
GLUCOSE: 114 mg/dL — AB (ref 65–99)
POTASSIUM: 4.6 mmol/L (ref 3.5–5.1)
SODIUM: 137 mmol/L (ref 135–145)

## 2017-08-12 LAB — CBC
HEMATOCRIT: 43.6 % (ref 40.0–52.0)
HEMOGLOBIN: 14.2 g/dL (ref 13.0–18.0)
MCH: 27.6 pg (ref 26.0–34.0)
MCHC: 32.5 g/dL (ref 32.0–36.0)
MCV: 84.8 fL (ref 80.0–100.0)
PLATELETS: 287 10*3/uL (ref 150–440)
RBC: 5.15 MIL/uL (ref 4.40–5.90)
RDW: 14.8 % — AB (ref 11.5–14.5)
WBC: 12.1 10*3/uL — AB (ref 3.8–10.6)

## 2017-08-12 LAB — TROPONIN I
Troponin I: <0.03 ng/mL (ref <0.03)
Troponin I: <0.03 ng/mL (ref <0.03)

## 2017-08-12 MED ORDER — ACETAMINOPHEN 500 MG PO TABS
1000.0000 mg | ORAL_TABLET | Freq: Once | ORAL | Status: AC
  Administered 2017-08-12: 1000 mg via ORAL
  Filled 2017-08-12: qty 2

## 2017-08-12 NOTE — ED Notes (Signed)
Report received 

## 2017-08-12 NOTE — Discharge Instructions (Signed)
Please seek medical attention for any high fevers, chest pain, shortness of breath, change in behavior, persistent vomiting, bloody stool or any other new or concerning symptoms.  

## 2017-08-12 NOTE — ED Triage Notes (Signed)
Pt to ed with c/o sob, weakness and cough today.  Pt states he was walking to breakfast and felt weak and dizzy.

## 2017-08-12 NOTE — ED Notes (Signed)
Given urinal. NAD. No needs at this time.

## 2017-08-12 NOTE — ED Provider Notes (Signed)
[facility] Provider Note  _______________________________________   I have reviewed the triage vital signs and the nursing notes.   HISTORY  Chief Complaint Shortness of Breath; Weakness; and Dizziness   History limited by: Not Limited   HPI Wesley Newton is a 83 y.o. male who presents to the emergency department today because of concerns for weakness and shortness of breath. The patient states that he had had a tough night last night. He had a lot of chest pain and back pain.this is not necessarily unusual for the patient. He has been having problems with hi back for the past few weeks. Today after waking up he had some coughing which again is not unusual for the patient. He denies bringing up any phlegm. When he went to go to breakfast he felt extremely weak. He had to stop and take a break. He was able to eat breakfast. However staff noticed he was not looking well and checked his heart rate. They stated it was an unusual rhythm. They sent him to the walk-in clinic which directed him to the emergency department. Patient was not aware of any fast heart rate and did not feel like his heart was beating abnormally.   Past Medical History:  Diagnosis Date  . Asthma   . BPH (benign prostatic hyperplasia)   . Bronchitis   . Cancer ([place])   . COPD (chronic obstructive pulmonary disease) ([place])   . DVT of leg (deep venous thrombosis) ([place])   . Elevated PSA   . Emphysema lung ([place])   . Emphysema of lung ([place])   . Hyperlipidemia   . Nocturia   . Pulmonary fibrosis ([place])   . Pulmonary fibrosis ([place])   . Skin cancer     Patient Active Problem List   Diagnosis Date Noted  . PAD (peripheral artery disease) ([place]) 04/25/2017  . BPH with obstruction/lower urinary tract symptoms 01/10/2016  . Nocturia 01/10/2016  . Benign prostatic hyperplasia with urinary obstruction 01/10/2016  . AB (asthmatic bronchitis) 12/17/2014  . Carotid artery disease  ([place]) 12/04/2014  . Acid reflux 12/04/2014  . H/O respiratory system disease 12/04/2014  . H/O peptic ulcer 12/04/2014  . Hypercholesterolemia 12/04/2014  . Unstable angina pectoris ([place]) 12/04/2014  . Amnesia 12/04/2014  . Chronic obstructive pulmonary disease ([place]) 06/11/2014  . Cephalalgia 06/11/2014  . BP (high blood pressure) 06/11/2014    Past Surgical History:  Procedure Laterality Date  . CATARACT EXTRACTION    . HERNIA REPAIR    . TONSILLECTOMY      Prior to Admission medications   Medication Sig Start Date End Date Taking? Authorizing Provider  albuterol (PROVENTIL HFA;VENTOLIN HFA) 108 (90 Base) MCG/ACT inhaler Inhale 2 puffs into the lungs every 6 (six) hours as needed for wheezing or shortness of breath.    [provider]  albuterol (PROVENTIL) (2.5 MG/3ML) 0.083% nebulizer solution Take 2.5 mg by nebulization every 6 (six) hours as needed for wheezing or shortness of breath.    [provider]  aspirin EC 81 MG tablet Take 81 mg by mouth daily.     [provider]  azelastine (ASTELIN) 0.1 % nasal spray  03/25/16   [provider]  benzonatate (TESSALON) 100 MG capsule Take 100 mg by mouth 3 (three) times daily as needed for cough.    [provider]  budesonide (PULMICORT) 0.5 MG/2ML nebulizer solution Inhale into the lungs. 03/20/17 03/20/18  [provider]  budesonide-formoterol (SYMBICORT) 160-4.5 MCG/ACT inhaler  Inhale 2 puffs into the lungs 2 (two) times daily.    [provider]  finasteride (PROSCAR) 5 MG tablet Take 1 tablet (5 mg total) by mouth daily. 02/07/17   Zara Council A, PA-C  fluticasone (FLONASE) 50 MCG/ACT nasal spray Place into the nose. 01/08/16 01/07/17  [provider]  gabapentin (NEURONTIN) 100 MG capsule Take 200 mg by mouth 2 (two) times daily. 03/27/17   [provider]  LEVOFLOXACIN PO Take by mouth daily.    [provider]  LORazepam (ATIVAN) 0.5 MG  tablet  03/28/17   [provider]  MAGNESIUM PO Take by mouth.    [provider]  Multiple Vitamins-Minerals (PRESERVISION AREDS 2 PO) Take 1 capsule by mouth 2 (two) times daily.    [provider]  naproxen (NAPROSYN) 375 MG tablet Take 375 mg by mouth 3 (three) times daily as needed for mild pain.     [provider]  omeprazole (PRILOSEC) 40 MG capsule Take 40 mg by mouth daily.     [provider]  predniSONE (STERAPRED UNI-PAK 21 TAB) 10 MG (21) TBPK tablet Take 10 mg by mouth as directed.    [provider]  simvastatin (ZOCOR) 40 MG tablet Take 40 mg by mouth at bedtime.     [provider]  tamsulosin (FLOMAX) 0.4 MG CAPS capsule Take 1 capsule (0.4 mg total) by mouth daily. Patient not taking: Reported on 04/25/2017 02/07/17   Zara Council A, PA-C    Allergies Amoxicillin; Codeine; Rapaflo [silodosin]; Tramadol; and Trimethoprim  Family History  Problem Relation Age of Onset  . Heart Problems Brother   . Kidney disease Neg Hx   . Prostate cancer Neg Hx     Social History Social History  Substance Use Topics  . Smoking status: Former Research scientist (life sciences)  . Smokeless tobacco: Never Used     Comment: quit 40 years  . Alcohol use No    Review of Systems Constitutional: No fever/chills Eyes: No visual changes. ENT: No sore throat. Cardiovascular: Positive for chest pain. Respiratory: Positive for shortness of breath. Gastrointestinal: No abdominal pain.  No nausea, no vomiting.  No diarrhea.   Genitourinary: Negative for dysuria. Musculoskeletal: Positive for back pain. Skin: Negative for rash. Neurological: Negative for headaches, focal weakness or numbness.  ____________________________________________   PHYSICAL EXAM:  VITAL SIGNS: ED Triage Vitals  Enc Vitals Group     BP 08/12/17 1023 (!) 114/59     Pulse Rate 08/12/17 1023 (!) 125     Resp 08/12/17 1023 18     Temp 08/12/17 1023 (!) 97.5 F (36.4 C)      Temp Source 08/12/17 1023 Oral     SpO2 08/12/17 1023 97 %     Weight 08/12/17 1026 167 lb (75.8 kg)     Height --      Head Circumference --      Peak Flow --      Pain Score 08/12/17 1022 0   Constitutional: Alert and oriented. Well appearing and in no distress. Eyes: Conjunctivae are normal.  ENT   Head: Normocephalic and atraumatic.   Nose: No congestion/rhinnorhea.   Mouth/Throat: Mucous membranes are moist.   Neck: No stridor. Hematological/Lymphatic/Immunilogical: No cervical lymphadenopathy. Cardiovascular: Normal rate, regular rhythm.  No murmurs, rubs, or gallops.  Respiratory: Normal respiratory effort without tachypnea nor retractions. Diffuse crackles in mid to base of bilateral lungs Gastrointestinal: Soft and non tender. No rebound. No guarding.  Genitourinary: Deferred Musculoskeletal: Normal  range of motion in all extremities. No lower extremity edema. Neurologic:  Normal speech and language. No gross focal neurologic deficits are appreciated.  Skin:  Skin is warm, dry and intact. No rash noted. Psychiatric: Mood and affect are normal. Speech and behavior are normal. Patient exhibits appropriate insight and judgment.  ____________________________________________    LABS (pertinent positives/negatives)  Labs Reviewed  BASIC METABOLIC PANEL - Abnormal; Notable for the following:       Result Value   Glucose, Bld 114 (*)    BUN 22 (*)    Creatinine, Ser 1.42 (*)    GFR calc non Af Amer 43 (*)    GFR calc Af Amer 49 (*)    All other components within normal limits  CBC - Abnormal; Notable for the following:    WBC 12.1 (*)    RDW 14.8 (*)    All other components within normal limits  URINALYSIS, COMPLETE (UACMP) WITH MICROSCOPIC - Abnormal; Notable for the following:    Color, Urine COLORLESS (*)    APPearance CLEAR (*)    Specific Gravity, Urine 1.004 (*)    All other components within normal limits  TROPONIN I  TROPONIN I  CBG  MONITORING, ED     ____________________________________________   EKG  I, Nance Pear, attending physician, personally viewed and interpreted this EKG  EKG Time: 1025 Rate: 97 Rhythm: normal sinus rhythm Axis: left axis deviation Intervals: qtc 419 QRS: narrow, LVH ST changes: no st elevation Impression: abnormal ekg   ____________________________________________    RADIOLOGY  CXR IMPRESSION:  Chronic interstitial lung disease. No acute findings.    Aortic Atherosclerosis (ICD10-170.0)      ____________________________________________   PROCEDURES  Procedures  ____________________________________________   INITIAL IMPRESSION / ASSESSMENT AND PLAN / ED COURSE  Pertinent labs & imaging results that were available during my care of the patient were reviewed by me and considered in my medical decision making (see chart for details).  Patient presented to the emergency department today because of concerns for weakness and fast heart rate. Patient without any arrhythmia during his stay in the emergency department. Two sets of troponin negative. Think possibly patient had paroxysmal afib, discussed with patient importance of follow up with PCP for holter monitor.   ____________________________________________   FINAL CLINICAL IMPRESSION(S) / ED DIAGNOSES  Final diagnoses:  Weakness     Note: This dictation was prepared with Dragon dictation. Any transcriptional errors that result from this process are unintentional     Nance Pear, MD 08/12/17 1456

## 2017-08-12 NOTE — ED Notes (Signed)
Dr goodman at bedside. 

## 2017-08-12 NOTE — ED Notes (Signed)
Report received.  Tachypnea noted but unlabored currently. Mild rales bilateral lower posterior bases. VSS. Family at bedside

## 2017-10-10 DIAGNOSIS — M5126 Other intervertebral disc displacement, lumbar region: Secondary | ICD-10-CM | POA: Insufficient documentation

## 2017-11-15 ENCOUNTER — Other Ambulatory Visit: Payer: Self-pay | Admitting: Specialist

## 2017-11-15 DIAGNOSIS — J8410 Pulmonary fibrosis, unspecified: Secondary | ICD-10-CM

## 2017-11-15 DIAGNOSIS — R0609 Other forms of dyspnea: Secondary | ICD-10-CM

## 2017-11-15 DIAGNOSIS — R053 Chronic cough: Secondary | ICD-10-CM

## 2017-11-15 DIAGNOSIS — R05 Cough: Secondary | ICD-10-CM

## 2017-11-23 ENCOUNTER — Ambulatory Visit
Admission: RE | Admit: 2017-11-23 | Discharge: 2017-11-23 | Disposition: A | Discharge status: Home / Self Care | Payer: Medicare Other | Source: Ambulatory Visit | Attending: Specialist | Admitting: Specialist

## 2017-11-23 DIAGNOSIS — R05 Cough: Secondary | ICD-10-CM | POA: Diagnosis not present

## 2017-11-23 DIAGNOSIS — I700 Atherosclerosis of aorta: Secondary | ICD-10-CM | POA: Insufficient documentation

## 2017-11-23 DIAGNOSIS — N200 Calculus of kidney: Secondary | ICD-10-CM | POA: Diagnosis not present

## 2017-11-23 DIAGNOSIS — I2510 Atherosclerotic heart disease of native coronary artery without angina pectoris: Secondary | ICD-10-CM | POA: Insufficient documentation

## 2017-11-23 DIAGNOSIS — R053 Chronic cough: Secondary | ICD-10-CM

## 2017-11-23 DIAGNOSIS — J8410 Pulmonary fibrosis, unspecified: Secondary | ICD-10-CM | POA: Diagnosis present

## 2017-11-23 DIAGNOSIS — R0609 Other forms of dyspnea: Secondary | ICD-10-CM | POA: Diagnosis not present

## 2018-02-07 ENCOUNTER — Encounter: Payer: Self-pay | Admitting: Urology

## 2018-02-07 ENCOUNTER — Ambulatory Visit: Payer: Medicare Other | Admitting: Urology

## 2018-02-07 VITALS — BP 144/73 | HR 81 | Ht 66.0 in | Wt 165.2 lb

## 2018-02-07 DIAGNOSIS — R351 Nocturia: Secondary | ICD-10-CM | POA: Diagnosis not present

## 2018-02-07 DIAGNOSIS — N138 Other obstructive and reflux uropathy: Secondary | ICD-10-CM | POA: Diagnosis not present

## 2018-02-07 DIAGNOSIS — N401 Enlarged prostate with lower urinary tract symptoms: Secondary | ICD-10-CM

## 2018-02-07 MED ORDER — FINASTERIDE 5 MG PO TABS: 5.0000 mg | ORAL_TABLET | Freq: Every day | ORAL | 12 refills | Status: DC

## 2018-02-07 NOTE — Progress Notes (Signed)
11:35 AM   Wesley Newton 1927-08-22 [id number]  Referring provider: Madelyn Brunner, MD No address on file  Chief Complaint  Patient presents with  . Benign Prostatic Hypertrophy    HPI: Patient is an 84 year old Caucasian male with a history of nocturia and BPH with LUTS who presents today for a one year follow up.    Nocturia Patient is experiencing nocturia up to 5 times nightly. He finds it very bothersome and interrupting to his sleep. He is suffering from pulmonary fibrosis. He has undergone a sleep study and was not found to have sleep apnea.  He had a trial of Myrbetriq 25 mg and he saw no benefit from taking the medication.  eGFR < 50   BPH WITH LUTS His IPSS score today is 14, which is moderate lower urinary tract symptomatology.   He is mostly satisfied with his quality life due to his urinary symptoms.   His previous I PSS score was 11/2.  His complaints today are frequency, nocturia, intermittency and a weak stream.  He denies any dysuria, hematuria or suprapubic pain.   He currently taking finasteride.   His has had prostate biopsies in the remote past and no cancer was identified.  His last PSA was 1.4 ng/mL on 12/2014.  He also denies any recent fevers, chills, nausea or vomiting.  He does not have a family history of PCa. IPSS    Row Name 02/07/18 1100         International Prostate Symptom Score   How often have you had the sensation of not emptying your bladder?  Less than 1 in 5     How often have you had to urinate less than every two hours?  Less than 1 in 5 times     How often have you found you stopped and started again several times when you urinated?  Less than half the time     How often have you found it difficult to postpone urination?  Less than half the time     How often have you had a weak urinary stream?  About half the time     How often have you had to strain to start urination?  Not at All     How many times did you typically get up  at night to urinate?  5 Times     Total IPSS Score  14       Quality of Life due to urinary symptoms   If you were to spend the rest of your life with your urinary condition just the way it is now how would you feel about that?  Mostly Satisfied        Score:  1-7 Mild 8-19 Moderate 20-35 Severe     PMH: Past Medical History:  Diagnosis Date  . Asthma   . BPH (benign prostatic hyperplasia)   . Bronchitis   . Cancer ([place])   . COPD (chronic obstructive pulmonary disease) ([place])   . DVT of leg (deep venous thrombosis) ([place])   . Elevated PSA   . Emphysema lung ([place])   . Emphysema of lung ([place])   . Hyperlipidemia   . Nocturia   . Pulmonary fibrosis ([place])   . Pulmonary fibrosis ([place])   . Skin cancer     Surgical History: Past Surgical History:  Procedure Laterality Date  . CATARACT EXTRACTION    . HERNIA REPAIR    . TONSILLECTOMY  Home Medications:  Allergies as of 02/07/2018      Reactions   Amoxicillin    Codeine Nausea And Vomiting   Rapaflo [silodosin] Other (See Comments)   Reaction:  Unknown    Tramadol Nausea And Vomiting, Other (See Comments)   Pt states that this medication makes him feel crazy.     Trimethoprim       Medication List        Accurate as of 02/07/18 11:35 AM. Always use your most recent med list.          albuterol 108 (90 Base) MCG/ACT inhaler Commonly known as:  PROVENTIL HFA;VENTOLIN HFA Inhale 2 puffs into the lungs every 6 (six) hours as needed for wheezing or shortness of breath.   albuterol (2.5 MG/3ML) 0.083% nebulizer solution Commonly known as:  PROVENTIL Take 2.5 mg by nebulization every 6 (six) hours as needed for wheezing or shortness of breath.   aspirin EC 81 MG tablet Take 81 mg by mouth daily.   azelastine 0.1 % nasal spray Commonly known as:  ASTELIN   benzonatate 100 MG capsule Commonly known as:  TESSALON Take 100 mg by mouth 3 (three) times daily as needed for cough.   budesonide 0.5 MG/2ML  nebulizer solution Commonly known as:  PULMICORT Inhale into the lungs.   finasteride 5 MG tablet Commonly known as:  PROSCAR Take 1 tablet (5 mg total) by mouth daily.   fluticasone 50 MCG/ACT nasal spray Commonly known as:  FLONASE Place into the nose.   gabapentin 100 MG capsule Commonly known as:  NEURONTIN Take 200 mg by mouth 2 (two) times daily.   LORazepam 0.5 MG tablet Commonly known as:  ATIVAN   MAGNESIUM PO Take by mouth.   naproxen 375 MG tablet Commonly known as:  NAPROSYN Take 375 mg by mouth 3 (three) times daily as needed for mild pain.   omeprazole 40 MG capsule Commonly known as:  PRILOSEC Take 40 mg by mouth daily.   PRESERVISION AREDS 2 PO Take 1 capsule by mouth 2 (two) times daily.   simvastatin 40 MG tablet Commonly known as:  ZOCOR Take 40 mg by mouth at bedtime.   SYMBICORT 160-4.5 MCG/ACT inhaler Generic drug:  budesonide-formoterol Inhale 2 puffs into the lungs 2 (two) times daily.   tamsulosin 0.4 MG Caps capsule Commonly known as:  FLOMAX Take 1 capsule (0.4 mg total) by mouth daily.       Allergies:  Allergies  Allergen Reactions  . Amoxicillin   . Codeine Nausea And Vomiting  . Rapaflo [Silodosin] Other (See Comments)    Reaction:  Unknown   . Tramadol Nausea And Vomiting and Other (See Comments)    Pt states that this medication makes him feel crazy.    . Trimethoprim     Family History: Family History  Problem Relation Age of Onset  . Heart Problems Brother   . Kidney disease Neg Hx   . Prostate cancer Neg Hx     Social History:  reports that he has quit smoking. he has never used smokeless tobacco. He reports that he does not drink alcohol or use drugs.  ROS: UROLOGY Frequent Urination?: Yes Hard to postpone urination?: No Burning/pain with urination?: No Get up at night to urinate?: Yes Leakage of urine?: Yes Urine stream starts and stops?: Yes Trouble starting stream?: No Do you have to strain to  urinate?: No Blood in urine?: No Urinary tract infection?: No Sexually transmitted disease?: No Injury to  kidneys or bladder?: No Painful intercourse?: No Weak stream?: Yes Erection problems?: No Penile pain?: No  Gastrointestinal Nausea?: No Vomiting?: No Indigestion/heartburn?: No Diarrhea?: No Constipation?: No  Constitutional Fever: No Night sweats?: No Weight loss?: Yes Fatigue?: Yes  Skin Skin rash/lesions?: No Itching?: Yes  Eyes Blurred vision?: No Double vision?: No  Ears/Nose/Throat Sore throat?: No Sinus problems?: Yes  Hematologic/Lymphatic Swollen glands?: No Easy bruising?: Yes  Cardiovascular Leg swelling?: No Chest pain?: No  Respiratory Cough?: Yes Shortness of breath?: Yes  Endocrine Excessive thirst?: No  Musculoskeletal Back pain?: Yes Joint pain?: Yes  Neurological Headaches?: Yes Dizziness?: Yes  Psychologic Depression?: No Anxiety?: Yes  Physical Exam: BP (!) 144/73 (BP Location: Right Arm, Patient Position: Sitting, Cuff Size: Normal)   Pulse 81   Ht '5\' 6"'  (1.676 m)   Wt 165 lb 3.2 oz (74.9 kg)   BMI 26.66 kg/m   Constitutional: Well nourished. Alert and oriented, No acute distress. HEENT: [place] AT, moist mucus membranes. Trachea midline, no masses. Cardiovascular: No clubbing, cyanosis, or edema. Respiratory: Normal respiratory effort, no increased work of breathing. Skin: No rashes, bruises or suspicious lesions. Lymph: No cervical or inguinal adenopathy. Neurologic: Grossly intact, no focal deficits, moving all 4 extremities. Psychiatric: Normal mood and affect.  Laboratory Data: Lab Results  Component Value Date   WBC 12.1 (H) 08/12/2017   HGB 14.2 08/12/2017   HCT 43.6 08/12/2017   MCV 84.8 08/12/2017   PLT 287 08/12/2017    Lab Results  Component Value Date   CREATININE 1.42 (H) 08/12/2017    Lab Results  Component Value Date   AST 25 08/02/2015   Lab Results  Component Value Date   ALT 16  (L) 08/02/2015   I have reviewed the labs.  Assessment & Plan:    1. Nocturia  - patient does not have sleep apnea  - not a candidate for desmopressin due to diminished GFR  - manage conservatively  2.  BPH with LUTS  - IPSS score is 14/2, it is worsening  - Continue conservative management, avoiding bladder irritants and timed voiding's  - Continue tamsulosin 0.4 mg daily and finasteride 5 mg daily; refills given  - RTC in one year for I PSS    Return in about 1 year (around 02/07/2019) for I PSS and exam.  These notes generated with voice recognition software. I apologize for typographical errors.  Zara Council, [facility] [address] [place], [place] [address] [phone]

## 2018-03-08 ENCOUNTER — Other Ambulatory Visit: Payer: Self-pay | Admitting: Urology

## 2018-03-08 DIAGNOSIS — N401 Enlarged prostate with lower urinary tract symptoms: Secondary | ICD-10-CM

## 2018-04-24 ENCOUNTER — Encounter (INDEPENDENT_AMBULATORY_CARE_PROVIDER_SITE_OTHER): Payer: Medicare Other

## 2018-04-24 ENCOUNTER — Ambulatory Visit (INDEPENDENT_AMBULATORY_CARE_PROVIDER_SITE_OTHER): Payer: Medicare Other | Admitting: Vascular Surgery

## 2018-06-22 ENCOUNTER — Encounter (INDEPENDENT_AMBULATORY_CARE_PROVIDER_SITE_OTHER): Payer: Medicare Other

## 2018-06-22 ENCOUNTER — Ambulatory Visit (INDEPENDENT_AMBULATORY_CARE_PROVIDER_SITE_OTHER): Payer: Medicare Other | Admitting: Vascular Surgery

## 2018-07-24 ENCOUNTER — Encounter

## 2018-07-24 ENCOUNTER — Ambulatory Visit (INDEPENDENT_AMBULATORY_CARE_PROVIDER_SITE_OTHER): Payer: Medicare Other

## 2018-07-24 ENCOUNTER — Encounter (INDEPENDENT_AMBULATORY_CARE_PROVIDER_SITE_OTHER): Payer: Self-pay | Admitting: Vascular Surgery

## 2018-07-24 ENCOUNTER — Ambulatory Visit (INDEPENDENT_AMBULATORY_CARE_PROVIDER_SITE_OTHER): Payer: Medicare Other | Admitting: Vascular Surgery

## 2018-07-24 VITALS — BP 137/72 | HR 67 | Resp 16 | Ht 66.0 in | Wt 159.4 lb

## 2018-07-24 DIAGNOSIS — I739 Peripheral vascular disease, unspecified: Secondary | ICD-10-CM

## 2018-07-24 DIAGNOSIS — I10 Essential (primary) hypertension: Secondary | ICD-10-CM | POA: Diagnosis not present

## 2018-07-24 DIAGNOSIS — E7800 Pure hypercholesterolemia, unspecified: Secondary | ICD-10-CM | POA: Diagnosis not present

## 2018-07-24 DIAGNOSIS — I779 Disorder of arteries and arterioles, unspecified: Secondary | ICD-10-CM

## 2018-07-24 DIAGNOSIS — I7789 Other specified disorders of arteries and arterioles: Secondary | ICD-10-CM

## 2018-07-24 DIAGNOSIS — I6523 Occlusion and stenosis of bilateral carotid arteries: Secondary | ICD-10-CM

## 2018-07-24 NOTE — Assessment & Plan Note (Signed)
Duplex shows no progression of disease with velocities in the 1 to 39% range on the right in the 40 to 59% range on the left. Continue current medical regimen including aspirin Zocor.  Recheck in 1 year

## 2018-07-24 NOTE — Assessment & Plan Note (Signed)
ABIs today are 0.99 right and 0.68 on the left with no significant change from the previous study with moderate atherosclerosis on duplex on the right and a known left SFA occlusion.  No lifestyle limiting or critical limb symptoms.  Recheck in 1 year.

## 2018-07-24 NOTE — Progress Notes (Signed)
MRN : [id number]  Wesley Newton is a 84 y.o. (1927/09/11) male who presents with chief complaint of  Chief Complaint  Patient presents with  . Follow-up    1 year ABI, CAROTID, and Arterial  .  History of Present Illness: Patient returns in follow-up of multiple vascular issues.  He is doing okay from a vascular standpoint but his lungs are giving him a lot more trouble.  He is not on oxygen, but it sounds like his pulmonary disease has declined.  He does not have any ulceration or rest pain of the lower extremities. ABIs today are 0.99 right and 0.68 on the left with no significant change from the previous study with moderate atherosclerosis on duplex on the right and a known left SFA occlusion.  He is also studied for carotid disease today.  He has not had any focal neurologic symptoms. Specifically, the patient denies amaurosis fugax, speech or swallowing difficulties, or arm or leg weakness or numbness.  Duplex shows no progression of disease with velocities in the 1 to 39% range on the right in the 40 to 59% range on the left.  Current Outpatient Medications  Medication Sig Dispense Refill  . albuterol (PROVENTIL HFA;VENTOLIN HFA) 108 (90 Base) MCG/ACT inhaler Inhale 2 puffs into the lungs every 6 (six) hours as needed for wheezing or shortness of breath.    Marland Kitchen albuterol (PROVENTIL) (2.5 MG/3ML) 0.083% nebulizer solution Take 2.5 mg by nebulization every 6 (six) hours as needed for wheezing or shortness of breath.    Marland Kitchen aspirin EC 81 MG tablet Take 81 mg by mouth daily.     Marland Kitchen azelastine (ASTELIN) 0.1 % nasal spray     . benzonatate (TESSALON) 100 MG capsule Take 100 mg by mouth 3 (three) times daily as needed for cough.    . budesonide-formoterol (SYMBICORT) 160-4.5 MCG/ACT inhaler Inhale 2 puffs into the lungs 2 (two) times daily.    . finasteride (PROSCAR) 5 MG tablet Take 1 tablet (5 mg total) by mouth daily. 30 tablet 12  . finasteride (PROSCAR) 5 MG tablet TAKE 1 TABLET BY MOUTH  ONCE DAILY 30 tablet 12  . gabapentin (NEURONTIN) 100 MG capsule Take 200 mg by mouth 2 (two) times daily.  0  . LORazepam (ATIVAN) 0.5 MG tablet   1  . MAGNESIUM PO Take by mouth.    . Multiple Vitamins-Minerals (PRESERVISION AREDS 2 PO) Take 1 capsule by mouth 2 (two) times daily.    . naproxen (NAPROSYN) 375 MG tablet Take 375 mg by mouth 3 (three) times daily as needed for mild pain.   2  . omeprazole (PRILOSEC) 40 MG capsule Take 40 mg by mouth daily.   4  . simvastatin (ZOCOR) 40 MG tablet Take 40 mg by mouth at bedtime.   0  . tamsulosin (FLOMAX) 0.4 MG CAPS capsule Take 1 capsule (0.4 mg total) by mouth daily. 30 capsule 12  . budesonide (PULMICORT) 0.5 MG/2ML nebulizer solution Inhale into the lungs.    . fluticasone (FLONASE) 50 MCG/ACT nasal spray Place into the nose.     No current facility-administered medications for this visit.     Past Medical History:  Diagnosis Date  . Asthma   . BPH (benign prostatic hyperplasia)   . Bronchitis   . Cancer ([place])   . COPD (chronic obstructive pulmonary disease) ([place])   . DVT of leg (deep venous thrombosis) ([place])   . Elevated PSA   . Emphysema lung ([place])   .  Emphysema of lung ([place])   . Hyperlipidemia   . Nocturia   . Pulmonary fibrosis ([place])   . Pulmonary fibrosis ([place])   . Skin cancer     Past Surgical History:  Procedure Laterality Date  . CATARACT EXTRACTION    . HERNIA REPAIR    . TONSILLECTOMY     Social History  Substance Use Topics  . Smoking status: Former Research scientist (life sciences)  . Smokeless tobacco: Never Used     Comment: quit 40 years  . Alcohol use No     Family History      Family History  Problem Relation Age of Onset  . Heart Problems Brother   . Kidney disease Neg Hx   . Prostate cancer Neg Hx           Allergies  Allergen Reactions  . Codeine Nausea And Vomiting  . Rapaflo [Silodosin] Other (See Comments)    Reaction:  Unknown   . Tramadol Nausea And Vomiting and Other (See Comments)     Pt states that this medication makes him feel crazy.       REVIEW OF SYSTEMS (Negative unless checked)  Constitutional: [] Weight loss  [] Fever  [] Chills Cardiac: [] Chest pain   [] Chest pressure   [] Palpitations   [] Shortness of breath when laying flat   [] Shortness of breath at rest   [] Shortness of breath with exertion. Vascular:  [x] Pain in legs with walking   [] Pain in legs at rest   [] Pain in legs when laying flat   [x] Claudication   [] Pain in feet when walking  [] Pain in feet at rest  [] Pain in feet when laying flat   [] History of DVT   [] Phlebitis   [] Swelling in legs   [] Varicose veins   [] Non-healing ulcers Pulmonary:   [] Uses home oxygen   [] Productive cough   [] Hemoptysis   [] Wheeze  [] COPD   [] Asthma Neurologic:  [] Dizziness  [] Blackouts   [] Seizures   [] History of stroke   [] History of TIA  [] Aphasia   [] Temporary blindness   [] Dysphagia   [] Weakness or numbness in arms   [] Weakness or numbness in legs Musculoskeletal:  [x] Arthritis   [] Joint swelling   [] Joint pain   [] Low back pain Hematologic:  [] Easy bruising  [] Easy bleeding   [] Hypercoagulable state   [] Anemic  [] Hepatitis Gastrointestinal:  [] Blood in stool   [] Vomiting blood  [] Gastroesophageal reflux/heartburn   [] Difficulty swallowing. Genitourinary:  [] Chronic kidney disease   [] Difficult urination  [] Frequent urination  [] Burning with urination   [] Blood in urine Skin:  [] Rashes   [] Ulcers   [] Wounds Psychological:  [] History of anxiety   []  History of major depression.    Physical Examination  Vitals:   07/24/18 1512  BP: 137/72  Pulse: 67  Resp: 16  Weight: 159 lb 6.4 oz (72.3 kg)  Height: 5\' 6"  (1.676 m)   Body mass index is 25.73 kg/m. Gen:  WD/WN, NAD.  Appears younger than stated age Head: [place]/AT, No temporalis wasting. Ear/Nose/Throat: Hearing somewhat diminished, nares w/o erythema or drainage, trachea midline Eyes: Conjunctiva clear. Sclera non-icteric Neck: Supple.  No bruit  Pulmonary:  Good  air movement, equal and clear to auscultation bilaterally.  Cardiac: RRR, No JVD Vascular:  Vessel Right Left  Radial Palpable Palpable                          PT  trace palpable  1+ palpable  DP Palpable  1+ palpable  Musculoskeletal: M/S 5/5 throughout.  No deformity or atrophy.  No significant lower extremity edema. Neurologic: CN 2-12 intact. Sensation grossly intact in extremities.  Symmetrical.  Speech is fluent. Motor exam as listed above. Psychiatric: Judgment intact, Mood & affect appropriate for pt's clinical situation.      CBC Lab Results  Component Value Date   WBC 12.1 (H) 08/12/2017   HGB 14.2 08/12/2017   HCT 43.6 08/12/2017   MCV 84.8 08/12/2017   PLT 287 08/12/2017    BMET    Component Value Date/Time   NA 137 08/12/2017 1038   NA 138 12/12/2012 0835   K 4.6 08/12/2017 1038   K 4.0 12/12/2012 0835   CL 103 08/12/2017 1038   CL 105 12/12/2012 0835   CO2 28 08/12/2017 1038   CO2 26 12/12/2012 0835   GLUCOSE 114 (H) 08/12/2017 1038   GLUCOSE 112 (H) 12/12/2012 0835   BUN 22 (H) 08/12/2017 1038   BUN 17 12/12/2012 0835   CREATININE 1.42 (H) 08/12/2017 1038   CREATININE 1.26 12/12/2012 0835   CALCIUM 9.6 08/12/2017 1038   CALCIUM 9.4 12/12/2012 0835   GFRNONAA 43 (L) 08/12/2017 1038   GFRNONAA 52 (L) 12/12/2012 0835   GFRAA 49 (L) 08/12/2017 1038   GFRAA >60 12/12/2012 0835   CrCl cannot be calculated (Patient's most recent lab result is older than the maximum 21 days allowed.).  COAG Lab Results  Component Value Date   INR 0.98 09/03/2015    Radiology No results found.   Assessment/Plan Hypercholesterolemia lipid control important in reducing the progression of atherosclerotic disease. Continue statin therapy   BP (high blood pressure) blood pressure control important in reducing the progression of atherosclerotic disease. On appropriate oral medications.   PAD (peripheral artery disease) (HCC) ABIs today are 0.99 right  and 0.68 on the left with no significant change from the previous study with moderate atherosclerosis on duplex on the right and a known left SFA occlusion.  No lifestyle limiting or critical limb symptoms.  Recheck in 1 year.  Carotid artery disease (HCC) Duplex shows no progression of disease with velocities in the 1 to 39% range on the right in the 40 to 59% range on the left. Continue current medical regimen including aspirin Zocor.  Recheck in 1 year    Leotis Pain, MD  07/24/2018 3:43 PM    This note was created with Dragon medical transcription system.  Any errors from dictation are purely unintentional

## 2018-08-02 ENCOUNTER — Other Ambulatory Visit: Payer: Self-pay | Admitting: Specialist

## 2018-08-02 DIAGNOSIS — R1310 Dysphagia, unspecified: Secondary | ICD-10-CM

## 2018-08-08 ENCOUNTER — Ambulatory Visit
Admission: RE | Admit: 2018-08-08 | Discharge: 2018-08-08 | Disposition: A | Discharge status: Home / Self Care | Payer: Medicare Other | Source: Ambulatory Visit | Attending: Specialist | Admitting: Specialist

## 2018-08-08 DIAGNOSIS — R1310 Dysphagia, unspecified: Secondary | ICD-10-CM | POA: Insufficient documentation

## 2018-08-08 DIAGNOSIS — R1312 Dysphagia, oropharyngeal phase: Secondary | ICD-10-CM

## 2018-08-08 NOTE — Therapy (Signed)
Wesley Newton, [place], [address] Phone: [phone]   Fax:     Modified Barium Swallow  Patient Details  Name: Wesley Newton MRN: [id number] Date of Birth: 06-03-1927 No data recorded  Encounter Date: 08/08/2018  End of Session - 08/08/18 1329    Visit Number  1    Number of Visits  1    Date for SLP Re-Evaluation  08/08/18    SLP Start Time  1229    SLP Stop Time   1320    SLP Time Calculation (min)  51 min    Activity Tolerance  Patient tolerated treatment well       Past Medical History:  Diagnosis Date  . Asthma   . BPH (benign prostatic hyperplasia)   . Bronchitis   . Cancer ([place])   . COPD (chronic obstructive pulmonary disease) ([place])   . DVT of leg (deep venous thrombosis) ([place])   . Elevated PSA   . Emphysema lung ([place])   . Emphysema of lung ([place])   . Hyperlipidemia   . Nocturia   . Pulmonary fibrosis ([place])   . Pulmonary fibrosis ([place])   . Skin cancer     Past Surgical History:  Procedure Laterality Date  . CATARACT EXTRACTION    . HERNIA REPAIR    . TONSILLECTOMY      There were no vitals filed for this visit.  Subjective: Patient behavior: (alertness, ability to follow instructions, etc.): The patient is alert, able to verbalize his complaints and follow directions.  Chief complaint: Problems swallowing pills and solids.  Patient reports occasional coughing with liquids.    Objective:  Radiological Procedure: A videoflouroscopic evaluation of oral-preparatory, reflex initiation, and pharyngeal phases of the swallow was performed; as well as a screening of the upper esophageal phase.  I. POSTURE: Upright in MBS chair  II. VIEW: Lateral  III. COMPENSATORY STRATEGIES: N/A  IV. BOLUSES ADMINISTERED:   Thin Liquid: 1 small, 3 rapid consecutive   Nectar-thick Liquid: 1 moderate   Honey-thick Liquid: DNT   Puree: 2 teaspoon presentations   Mechanical Soft: 1/4  graham cracker in applesauce     Barium tablet  V. RESULTS OF EVALUATION: A. ORAL PREPARATORY PHASE: (The lips, tongue, and velum are observed for strength and coordination)       **Overall Severity Rating: Within normal limits  B. SWALLOW INITIATION/REFLEX: (The reflex is normal if "triggered" by the time the bolus reached the base of the tongue)  **Overall Severity Rating: Mild; triggers while falling from the valleculae to the pyriform sinuses with liquids  C. PHARYNGEAL PHASE: (Pharyngeal function is normal if the bolus shows rapid, smooth, and continuous transit through the pharynx and there is no pharyngeal residue after the swallow)  **Overall Severity Rating: Within normal limits  D. LARYNGEAL PENETRATION: (Material entering into the laryngeal inlet/vestibule but not aspirated) Transient laryngeal penetration with nectar-thick and thin liquids  E. ASPIRATION: No observed aspiration with barium impregnated boluses, suspected aspiration of water while taking barium tablet- vigorous cough  F. ESOPHAGEAL PHASE: (Screening of the upper esophagus): no overt abnormality within the viewable cervical esophagus  ASSESSMENT: This 84 year old man; with complaint of difficulty swallowing pills and solids; is presenting with mild oropharyngeal dysphagia characterized by delayed pharyngeal swallow initiation and transient laryngeal penetration.  Oral control of the bolus including oral hold, rotary mastication, and anterior to posterior transfer is within normal limits.  Aspects of the pharyngeal stage  of swallowing including tongue base retraction, hyolaryngeal excursion, epiglottic inversion, and duration/amplitude of UES opening are within normal limits.  There is no significant pharyngeal residue or tracheal aspiration.  The patient does not appear to be at significant risk for prandial aspiration.   While taking the barium tablet with plain water, the patient apparently aspirated with resultant  vigorous cough, presumably protective.  The patient is complaining of vocal quality change and may benefit from referral to ENT.  PLAN/RECOMMENDATIONS:   A. Diet: Regular   B. Swallowing Precautions:  Soften / moisten foods as needed, drink mindfully   C. Recommended consultation to: ENT secondary persistent hoarseness; PCP secondary complaint of constipation   D. Therapy recommendations: speech therapy is not indicated at this time   E. Results and recommendations were discussed with the patient immediately following the study and the final report routed to the referring MD.   Oropharyngeal dysphagia - Plan: DG OP Swallowing Func-Medicare/Speech Path, DG OP Swallowing Func-Medicare/Speech Path        Problem List Patient Active Problem List   Diagnosis Date Noted  . PAD (peripheral artery disease) ([place]) 04/25/2017  . BPH with obstruction/lower urinary tract symptoms 01/10/2016  . Nocturia 01/10/2016  . Benign prostatic hyperplasia with urinary obstruction 01/10/2016  . AB (asthmatic bronchitis) 12/17/2014  . Carotid artery disease ([place]) 12/04/2014  . Acid reflux 12/04/2014  . H/O respiratory system disease 12/04/2014  . H/O peptic ulcer 12/04/2014  . Hypercholesterolemia 12/04/2014  . Unstable angina pectoris ([place]) 12/04/2014  . Amnesia 12/04/2014  . Chronic obstructive pulmonary disease ([place]) 06/11/2014  . Cephalalgia 06/11/2014  . BP (high blood pressure) 06/11/2014   Leroy Sea, MS/CCC- SLP  Lou Miner 08/08/2018, 1:30 PM  [facility] [place], [place], [address] Phone: [phone]   Fax:     Name: Wesley Newton MRN: [id number] Date of Birth: July 17, 1927

## 2018-12-18 ENCOUNTER — Inpatient Hospital Stay
Admission: EM | Admit: 2018-12-18 | Discharge: 2018-12-21 | DRG: 189 | Disposition: A | Discharge status: Skilled Nursing Facility | Payer: Medicare Other | Attending: Specialist | Admitting: Specialist

## 2018-12-18 ENCOUNTER — Emergency Department: Payer: Medicare Other

## 2018-12-18 ENCOUNTER — Other Ambulatory Visit: Payer: Self-pay

## 2018-12-18 DIAGNOSIS — Z9849 Cataract extraction status, unspecified eye: Secondary | ICD-10-CM

## 2018-12-18 DIAGNOSIS — J9621 Acute and chronic respiratory failure with hypoxia: Secondary | ICD-10-CM | POA: Diagnosis not present

## 2018-12-18 DIAGNOSIS — Z86718 Personal history of other venous thrombosis and embolism: Secondary | ICD-10-CM

## 2018-12-18 DIAGNOSIS — E7800 Pure hypercholesterolemia, unspecified: Secondary | ICD-10-CM | POA: Diagnosis present

## 2018-12-18 DIAGNOSIS — J189 Pneumonia, unspecified organism: Secondary | ICD-10-CM | POA: Diagnosis present

## 2018-12-18 DIAGNOSIS — Z888 Allergy status to other drugs, medicaments and biological substances status: Secondary | ICD-10-CM

## 2018-12-18 DIAGNOSIS — J1000 Influenza due to other identified influenza virus with unspecified type of pneumonia: Secondary | ICD-10-CM | POA: Diagnosis present

## 2018-12-18 DIAGNOSIS — I739 Peripheral vascular disease, unspecified: Secondary | ICD-10-CM | POA: Diagnosis present

## 2018-12-18 DIAGNOSIS — R509 Fever, unspecified: Secondary | ICD-10-CM | POA: Diagnosis not present

## 2018-12-18 DIAGNOSIS — Z6825 Body mass index (BMI) 25.0-25.9, adult: Secondary | ICD-10-CM

## 2018-12-18 DIAGNOSIS — Z791 Long term (current) use of non-steroidal anti-inflammatories (NSAID): Secondary | ICD-10-CM

## 2018-12-18 DIAGNOSIS — J439 Emphysema, unspecified: Secondary | ICD-10-CM | POA: Diagnosis present

## 2018-12-18 DIAGNOSIS — Z9981 Dependence on supplemental oxygen: Secondary | ICD-10-CM

## 2018-12-18 DIAGNOSIS — Z7951 Long term (current) use of inhaled steroids: Secondary | ICD-10-CM

## 2018-12-18 DIAGNOSIS — Z9089 Acquired absence of other organs: Secondary | ICD-10-CM

## 2018-12-18 DIAGNOSIS — Z79899 Other long term (current) drug therapy: Secondary | ICD-10-CM

## 2018-12-18 DIAGNOSIS — J101 Influenza due to other identified influenza virus with other respiratory manifestations: Secondary | ICD-10-CM | POA: Diagnosis present

## 2018-12-18 DIAGNOSIS — J8410 Pulmonary fibrosis, unspecified: Secondary | ICD-10-CM | POA: Diagnosis present

## 2018-12-18 DIAGNOSIS — Z87891 Personal history of nicotine dependence: Secondary | ICD-10-CM

## 2018-12-18 DIAGNOSIS — Z880 Allergy status to penicillin: Secondary | ICD-10-CM

## 2018-12-18 DIAGNOSIS — R351 Nocturia: Secondary | ICD-10-CM

## 2018-12-18 DIAGNOSIS — Z8249 Family history of ischemic heart disease and other diseases of the circulatory system: Secondary | ICD-10-CM

## 2018-12-18 DIAGNOSIS — E785 Hyperlipidemia, unspecified: Secondary | ICD-10-CM | POA: Diagnosis present

## 2018-12-18 DIAGNOSIS — Z7982 Long term (current) use of aspirin: Secondary | ICD-10-CM

## 2018-12-18 DIAGNOSIS — E440 Moderate protein-calorie malnutrition: Secondary | ICD-10-CM

## 2018-12-18 DIAGNOSIS — N400 Enlarged prostate without lower urinary tract symptoms: Secondary | ICD-10-CM | POA: Diagnosis present

## 2018-12-18 DIAGNOSIS — I10 Essential (primary) hypertension: Secondary | ICD-10-CM | POA: Diagnosis present

## 2018-12-18 DIAGNOSIS — Z85828 Personal history of other malignant neoplasm of skin: Secondary | ICD-10-CM

## 2018-12-18 DIAGNOSIS — Z8711 Personal history of peptic ulcer disease: Secondary | ICD-10-CM

## 2018-12-18 DIAGNOSIS — Z885 Allergy status to narcotic agent status: Secondary | ICD-10-CM

## 2018-12-18 DIAGNOSIS — K219 Gastro-esophageal reflux disease without esophagitis: Secondary | ICD-10-CM | POA: Diagnosis present

## 2018-12-18 LAB — COMPREHENSIVE METABOLIC PANEL
ALBUMIN: 3.7 g/dL (ref 3.5–5.0)
ALT: 18 U/L (ref 0–44)
AST: 27 U/L (ref 15–41)
Alkaline Phosphatase: 72 U/L (ref 38–126)
Anion gap: 8 (ref 5–15)
BUN: 13 mg/dL (ref 8–23)
CHLORIDE: 104 mmol/L (ref 98–111)
CO2: 27 mmol/L (ref 22–32)
CREATININE: 1.12 mg/dL (ref 0.61–1.24)
Calcium: 8.9 mg/dL (ref 8.9–10.3)
GFR calc Af Amer: >60 mL/min (ref >60)
GFR calc non Af Amer: 58 mL/min — ABNORMAL LOW (ref >60)
GLUCOSE: 126 mg/dL — AB (ref 70–99)
POTASSIUM: 3.9 mmol/L (ref 3.5–5.1)
Sodium: 139 mmol/L (ref 135–145)
Total Bilirubin: 0.7 mg/dL (ref 0.3–1.2)
Total Protein: 7.1 g/dL (ref 6.5–8.1)

## 2018-12-18 LAB — CBC
HEMATOCRIT: 37.7 % — AB (ref 39.0–52.0)
Hemoglobin: 12 g/dL — ABNORMAL LOW (ref 13.0–17.0)
MCH: 27.5 pg (ref 26.0–34.0)
MCHC: 31.8 g/dL (ref 30.0–36.0)
MCV: 86.3 fL (ref 80.0–100.0)
NRBC: 0 % (ref 0.0–0.2)
PLATELETS: 286 10*3/uL (ref 150–400)
RBC: 4.37 MIL/uL (ref 4.22–5.81)
RDW: 14.8 % (ref 11.5–15.5)
WBC: 12 10*3/uL — AB (ref 4.0–10.5)

## 2018-12-18 LAB — INFLUENZA PANEL BY PCR (TYPE A & B)
INFLAPCR: NEGATIVE
Influenza B By PCR: POSITIVE — AB

## 2018-12-18 NOTE — ED Triage Notes (Signed)
Patient from [facility], c/o of chills and SOB began today.

## 2018-12-19 DIAGNOSIS — Z79899 Other long term (current) drug therapy: Secondary | ICD-10-CM | POA: Diagnosis not present

## 2018-12-19 DIAGNOSIS — J189 Pneumonia, unspecified organism: Secondary | ICD-10-CM | POA: Diagnosis present

## 2018-12-19 DIAGNOSIS — E440 Moderate protein-calorie malnutrition: Secondary | ICD-10-CM | POA: Diagnosis present

## 2018-12-19 DIAGNOSIS — R509 Fever, unspecified: Secondary | ICD-10-CM | POA: Diagnosis present

## 2018-12-19 DIAGNOSIS — J101 Influenza due to other identified influenza virus with other respiratory manifestations: Secondary | ICD-10-CM | POA: Diagnosis present

## 2018-12-19 DIAGNOSIS — Z6825 Body mass index (BMI) 25.0-25.9, adult: Secondary | ICD-10-CM | POA: Diagnosis not present

## 2018-12-19 DIAGNOSIS — Z9849 Cataract extraction status, unspecified eye: Secondary | ICD-10-CM | POA: Diagnosis not present

## 2018-12-19 DIAGNOSIS — J8410 Pulmonary fibrosis, unspecified: Secondary | ICD-10-CM | POA: Diagnosis present

## 2018-12-19 DIAGNOSIS — Z86718 Personal history of other venous thrombosis and embolism: Secondary | ICD-10-CM | POA: Diagnosis not present

## 2018-12-19 DIAGNOSIS — Z7951 Long term (current) use of inhaled steroids: Secondary | ICD-10-CM | POA: Diagnosis not present

## 2018-12-19 DIAGNOSIS — K219 Gastro-esophageal reflux disease without esophagitis: Secondary | ICD-10-CM | POA: Diagnosis present

## 2018-12-19 DIAGNOSIS — Z8711 Personal history of peptic ulcer disease: Secondary | ICD-10-CM | POA: Diagnosis not present

## 2018-12-19 DIAGNOSIS — E785 Hyperlipidemia, unspecified: Secondary | ICD-10-CM | POA: Diagnosis present

## 2018-12-19 DIAGNOSIS — Z87891 Personal history of nicotine dependence: Secondary | ICD-10-CM | POA: Diagnosis not present

## 2018-12-19 DIAGNOSIS — J1000 Influenza due to other identified influenza virus with unspecified type of pneumonia: Secondary | ICD-10-CM | POA: Diagnosis present

## 2018-12-19 DIAGNOSIS — E7800 Pure hypercholesterolemia, unspecified: Secondary | ICD-10-CM | POA: Diagnosis present

## 2018-12-19 DIAGNOSIS — J439 Emphysema, unspecified: Secondary | ICD-10-CM | POA: Diagnosis present

## 2018-12-19 DIAGNOSIS — Z85828 Personal history of other malignant neoplasm of skin: Secondary | ICD-10-CM | POA: Diagnosis not present

## 2018-12-19 DIAGNOSIS — Z9981 Dependence on supplemental oxygen: Secondary | ICD-10-CM | POA: Diagnosis not present

## 2018-12-19 DIAGNOSIS — J9621 Acute and chronic respiratory failure with hypoxia: Secondary | ICD-10-CM | POA: Diagnosis present

## 2018-12-19 DIAGNOSIS — I10 Essential (primary) hypertension: Secondary | ICD-10-CM | POA: Diagnosis present

## 2018-12-19 DIAGNOSIS — Z8249 Family history of ischemic heart disease and other diseases of the circulatory system: Secondary | ICD-10-CM | POA: Diagnosis not present

## 2018-12-19 DIAGNOSIS — Z9089 Acquired absence of other organs: Secondary | ICD-10-CM | POA: Diagnosis not present

## 2018-12-19 DIAGNOSIS — I739 Peripheral vascular disease, unspecified: Secondary | ICD-10-CM | POA: Diagnosis present

## 2018-12-19 DIAGNOSIS — N400 Enlarged prostate without lower urinary tract symptoms: Secondary | ICD-10-CM | POA: Diagnosis present

## 2018-12-19 DIAGNOSIS — Z7982 Long term (current) use of aspirin: Secondary | ICD-10-CM | POA: Diagnosis not present

## 2018-12-19 DIAGNOSIS — Z791 Long term (current) use of non-steroidal anti-inflammatories (NSAID): Secondary | ICD-10-CM | POA: Diagnosis not present

## 2018-12-19 LAB — CBC
HCT: 36.5 % — ABNORMAL LOW (ref 39.0–52.0)
Hemoglobin: 11.6 g/dL — ABNORMAL LOW (ref 13.0–17.0)
MCH: 27.2 pg (ref 26.0–34.0)
MCHC: 31.8 g/dL (ref 30.0–36.0)
MCV: 85.7 fL (ref 80.0–100.0)
Platelets: 265 10*3/uL (ref 150–400)
RBC: 4.26 MIL/uL (ref 4.22–5.81)
RDW: 14.8 % (ref 11.5–15.5)
WBC: 12.9 10*3/uL — AB (ref 4.0–10.5)
nRBC: 0 % (ref 0.0–0.2)

## 2018-12-19 LAB — BASIC METABOLIC PANEL
Anion gap: 7 (ref 5–15)
BUN: 12 mg/dL (ref 8–23)
CO2: 27 mmol/L (ref 22–32)
Calcium: 8.8 mg/dL — ABNORMAL LOW (ref 8.9–10.3)
Chloride: 107 mmol/L (ref 98–111)
Creatinine, Ser: 1.06 mg/dL (ref 0.61–1.24)
GFR calc Af Amer: >60 mL/min (ref >60)
GFR calc non Af Amer: >60 mL/min (ref >60)
Glucose, Bld: 109 mg/dL — ABNORMAL HIGH (ref 70–99)
Potassium: 4 mmol/L (ref 3.5–5.1)
SODIUM: 141 mmol/L (ref 135–145)

## 2018-12-19 LAB — URINALYSIS, COMPLETE (UACMP) WITH MICROSCOPIC
BILIRUBIN URINE: NEGATIVE
Bacteria, UA: NONE SEEN
GLUCOSE, UA: NEGATIVE mg/dL
Hgb urine dipstick: NEGATIVE
KETONES UR: NEGATIVE mg/dL
LEUKOCYTES UA: NEGATIVE
Nitrite: NEGATIVE
PH: 6 (ref 5.0–8.0)
Protein, ur: NEGATIVE mg/dL
Specific Gravity, Urine: 1.008 (ref 1.005–1.030)
Squamous Epithelial / LPF: NONE SEEN (ref 0–5)

## 2018-12-19 MED ORDER — SIMVASTATIN 20 MG PO TABS
40.0000 mg | ORAL_TABLET | Freq: Every day | ORAL | Status: DC
  Administered 2018-12-19 – 2018-12-20 (×2): 40 mg via ORAL
  Filled 2018-12-19 (×2): qty 2

## 2018-12-19 MED ORDER — ONDANSETRON HCL 4 MG PO TABS
4.0000 mg | ORAL_TABLET | Freq: Four times a day (QID) | ORAL | Status: DC | PRN
  Administered 2018-12-20: 4 mg via ORAL
  Filled 2018-12-19: qty 1

## 2018-12-19 MED ORDER — ACETAMINOPHEN 650 MG RE SUPP: 650.0000 mg | Freq: Four times a day (QID) | RECTAL | Status: DC | PRN

## 2018-12-19 MED ORDER — BUDESONIDE 0.5 MG/2ML IN SUSP
0.5000 mg | Freq: Every day | RESPIRATORY_TRACT | Status: DC
  Administered 2018-12-19 – 2018-12-21 (×3): 0.5 mg via RESPIRATORY_TRACT
  Filled 2018-12-19 (×3): qty 2

## 2018-12-19 MED ORDER — IPRATROPIUM-ALBUTEROL 0.5-2.5 (3) MG/3ML IN SOLN: 3.0000 mL | Freq: Four times a day (QID) | RESPIRATORY_TRACT | Status: DC

## 2018-12-19 MED ORDER — IPRATROPIUM-ALBUTEROL 0.5-2.5 (3) MG/3ML IN SOLN
3.0000 mL | Freq: Four times a day (QID) | RESPIRATORY_TRACT | Status: DC
  Administered 2018-12-19 – 2018-12-21 (×9): 3 mL via RESPIRATORY_TRACT
  Filled 2018-12-19 (×8): qty 3

## 2018-12-19 MED ORDER — SODIUM CHLORIDE 0.9 % IV SOLN
500.0000 mg | INTRAVENOUS | Status: DC
  Administered 2018-12-19 – 2018-12-20 (×2): 500 mg via INTRAVENOUS
  Filled 2018-12-19 (×2): qty 500

## 2018-12-19 MED ORDER — FINASTERIDE 5 MG PO TABS
5.0000 mg | ORAL_TABLET | Freq: Every day | ORAL | Status: DC
  Administered 2018-12-19 – 2018-12-21 (×3): 5 mg via ORAL
  Filled 2018-12-19 (×3): qty 1

## 2018-12-19 MED ORDER — OSELTAMIVIR PHOSPHATE 30 MG PO CAPS
30.0000 mg | ORAL_CAPSULE | Freq: Two times a day (BID) | ORAL | Status: DC
  Administered 2018-12-19 – 2018-12-21 (×5): 30 mg via ORAL
  Filled 2018-12-19 (×6): qty 1

## 2018-12-19 MED ORDER — BENZONATATE 100 MG PO CAPS
200.0000 mg | ORAL_CAPSULE | Freq: Three times a day (TID) | ORAL | Status: DC | PRN
  Administered 2018-12-19 – 2018-12-20 (×2): 200 mg via ORAL
  Filled 2018-12-19 (×2): qty 2

## 2018-12-19 MED ORDER — ALBUTEROL SULFATE (2.5 MG/3ML) 0.083% IN NEBU
2.5000 mg | INHALATION_SOLUTION | Freq: Four times a day (QID) | RESPIRATORY_TRACT | Status: DC
  Administered 2018-12-19: 09:00:00 2.5 mg via RESPIRATORY_TRACT
  Filled 2018-12-19: qty 3

## 2018-12-19 MED ORDER — SODIUM CHLORIDE 0.9 % IV SOLN
1.0000 g | Freq: Once | INTRAVENOUS | Status: AC
  Administered 2018-12-19: 1 g via INTRAVENOUS
  Filled 2018-12-19: qty 10

## 2018-12-19 MED ORDER — MOMETASONE FURO-FORMOTEROL FUM 200-5 MCG/ACT IN AERO
2.0000 | INHALATION_SPRAY | Freq: Two times a day (BID) | RESPIRATORY_TRACT | Status: DC
  Filled 2018-12-19: qty 8.8

## 2018-12-19 MED ORDER — ENOXAPARIN SODIUM 40 MG/0.4ML ~~LOC~~ SOLN: 40.0000 mg | SUBCUTANEOUS | Status: DC

## 2018-12-19 MED ORDER — PANTOPRAZOLE SODIUM 40 MG PO TBEC
40.0000 mg | DELAYED_RELEASE_TABLET | Freq: Every day | ORAL | Status: DC
  Administered 2018-12-19 – 2018-12-21 (×3): 40 mg via ORAL
  Filled 2018-12-19 (×3): qty 1

## 2018-12-19 MED ORDER — ACETAMINOPHEN 325 MG PO TABS
650.0000 mg | ORAL_TABLET | Freq: Four times a day (QID) | ORAL | Status: DC | PRN
  Administered 2018-12-20: 650 mg via ORAL
  Filled 2018-12-19: qty 2

## 2018-12-19 MED ORDER — TAMSULOSIN HCL 0.4 MG PO CAPS
0.4000 mg | ORAL_CAPSULE | Freq: Every day | ORAL | Status: DC
  Administered 2018-12-19 – 2018-12-21 (×3): 0.4 mg via ORAL
  Filled 2018-12-19 (×3): qty 1

## 2018-12-19 MED ORDER — GUAIFENESIN-DM 100-10 MG/5ML PO SYRP
5.0000 mL | ORAL_SOLUTION | ORAL | Status: DC | PRN
  Administered 2018-12-19 – 2018-12-20 (×5): 5 mL via ORAL
  Filled 2018-12-19 (×6): qty 5

## 2018-12-19 MED ORDER — SODIUM CHLORIDE 0.9 % IV SOLN: 500.0000 mg | Freq: Once | INTRAVENOUS | Status: DC

## 2018-12-19 MED ORDER — IPRATROPIUM-ALBUTEROL 0.5-2.5 (3) MG/3ML IN SOLN
3.0000 mL | Freq: Every day | RESPIRATORY_TRACT | Status: DC
  Filled 2018-12-19: qty 3

## 2018-12-19 MED ORDER — ASPIRIN EC 81 MG PO TBEC
81.0000 mg | DELAYED_RELEASE_TABLET | Freq: Every day | ORAL | Status: DC
  Administered 2018-12-19 – 2018-12-21 (×3): 81 mg via ORAL
  Filled 2018-12-19 (×3): qty 1

## 2018-12-19 MED ORDER — ONDANSETRON HCL 4 MG/2ML IJ SOLN
4.0000 mg | Freq: Four times a day (QID) | INTRAMUSCULAR | Status: DC | PRN
  Administered 2018-12-20: 4 mg via INTRAVENOUS
  Filled 2018-12-19: qty 2

## 2018-12-19 MED ORDER — HYDROCORTISONE 1 % EX CREA
1.0000 "application " | TOPICAL_CREAM | Freq: Three times a day (TID) | CUTANEOUS | Status: DC | PRN
  Administered 2018-12-19: 1 via TOPICAL
  Filled 2018-12-19: qty 28

## 2018-12-19 MED ORDER — SODIUM CHLORIDE 0.9 % IV SOLN
1.0000 g | INTRAVENOUS | Status: DC
  Administered 2018-12-20: 05:00:00 1 g via INTRAVENOUS
  Filled 2018-12-19: qty 10
  Filled 2018-12-19: qty 1

## 2018-12-19 MED ORDER — ALBUTEROL SULFATE (2.5 MG/3ML) 0.083% IN NEBU: 2.5000 mg | INHALATION_SOLUTION | RESPIRATORY_TRACT | Status: DC | PRN

## 2018-12-19 MED ORDER — IPRATROPIUM-ALBUTEROL 0.5-2.5 (3) MG/3ML IN SOLN: 3.0000 mL | RESPIRATORY_TRACT | Status: DC | PRN

## 2018-12-19 MED ORDER — ENOXAPARIN SODIUM 40 MG/0.4ML ~~LOC~~ SOLN
40.0000 mg | SUBCUTANEOUS | Status: DC
  Administered 2018-12-19 – 2018-12-21 (×3): 40 mg via SUBCUTANEOUS
  Filled 2018-12-19 (×3): qty 0.4

## 2018-12-19 MED ORDER — OSELTAMIVIR PHOSPHATE 75 MG PO CAPS
75.0000 mg | ORAL_CAPSULE | Freq: Once | ORAL | Status: AC
  Administered 2018-12-19: 75 mg via ORAL
  Filled 2018-12-19: qty 1

## 2018-12-19 NOTE — ED Provider Notes (Signed)
[facility] Emergency Department Provider Note  ____________________________________________   First MD Initiated Contact with Patient 12/19/18 [phone]     (approximate)  I have reviewed the triage vital signs and the nursing notes.   HISTORY  Chief Complaint Chills and Shortness of Breath   HPI Wesley Newton is a 85 y.o. male with below list of chronic medical conditions presents to the emergency department from [facility] with subjective fevers chills progressive dyspnea and cough today.  Of note the patient has had sick contact with influenza.  Patient does admit to receiving influenza vaccine this year.  She denies any chest pain.  Patient denies any lower extremity pain or swelling.  Past Medical History:  Diagnosis Date  . Asthma   . BPH (benign prostatic hyperplasia)   . Bronchitis   . Cancer ([place])   . COPD (chronic obstructive pulmonary disease) ([place])   . DVT of leg (deep venous thrombosis) ([place])   . Elevated PSA   . Emphysema lung ([place])   . Emphysema of lung ([place])   . Hyperlipidemia   . Nocturia   . Pulmonary fibrosis ([place])   . Pulmonary fibrosis ([place])   . Skin cancer     Patient Active Problem List   Diagnosis Date Noted  . Influenza B 12/19/2018  . CAP (community acquired pneumonia) 12/19/2018  . PAD (peripheral artery disease) ([place]) 04/25/2017  . BPH (benign prostatic hyperplasia) 01/10/2016  . Nocturia 01/10/2016  . Benign prostatic hyperplasia with urinary obstruction 01/10/2016  . AB (asthmatic bronchitis) 12/17/2014  . Carotid artery disease ([place]) 12/04/2014  . GERD (gastroesophageal reflux disease) 12/04/2014  . H/O respiratory system disease 12/04/2014  . H/O peptic ulcer 12/04/2014  . Hypercholesterolemia 12/04/2014  . Unstable angina pectoris ([place]) 12/04/2014  . Amnesia 12/04/2014  . Chronic obstructive pulmonary disease ([place]) 06/11/2014  . Cephalalgia 06/11/2014  . HTN (hypertension) 06/11/2014    Past Surgical  History:  Procedure Laterality Date  . CATARACT EXTRACTION    . HERNIA REPAIR    . TONSILLECTOMY      Prior to Admission medications   Medication Sig Start Date End Date Taking? Authorizing Provider  albuterol (PROVENTIL HFA;VENTOLIN HFA) 108 (90 Base) MCG/ACT inhaler Inhale 2 puffs into the lungs every 6 (six) hours as needed for wheezing or shortness of breath.   Yes [provider]  albuterol (PROVENTIL) (2.5 MG/3ML) 0.083% nebulizer solution Take 2.5 mg by nebulization every 6 (six) hours as needed for wheezing or shortness of breath.   Yes [provider]  aspirin EC 81 MG tablet Take 81 mg by mouth daily.    Yes [provider]  budesonide (PULMICORT) 0.5 MG/2ML nebulizer solution Take 0.5 mg by nebulization daily.   Yes [provider]  finasteride (PROSCAR) 5 MG tablet Take 1 tablet (5 mg total) by mouth daily. 02/07/18  Yes McGowan, Larene [place] A, PA-C  gabapentin (NEURONTIN) 100 MG capsule Take 200 mg by mouth 2 (two) times daily. 03/27/17  Yes [provider]  isosorbide mononitrate (IMDUR) 30 MG 24 hr tablet Take 30 mg by mouth daily.   Yes [provider]  LORazepam (ATIVAN) 0.5 MG tablet Take 0.5 mg by mouth every 8 (eight) hours as needed.  03/28/17  Yes [provider]  MAGNESIUM PO Take 1 tablet by mouth daily.    Yes [provider]  Multiple Vitamin (MULTIVITAMIN WITH MINERALS) TABS tablet Take 1 tablet by mouth daily.   Yes [provider]  Multiple Vitamins-Minerals (PRESERVISION AREDS 2 PO) Take 1 capsule by mouth 2 (two) times daily.   Yes [provider]  naproxen (NAPROSYN) 375 MG tablet Take 375 mg by mouth 3 (three) times daily with meals.    Yes [provider]  omeprazole (PRILOSEC) 40 MG capsule Take 40 mg by mouth daily.    Yes [provider]  simvastatin (ZOCOR) 40 MG tablet Take 40 mg by mouth at bedtime.    Yes [provider]     Allergies Amoxicillin; Codeine; Rapaflo [silodosin]; Tramadol; and Trimethoprim  Family History  Problem Relation Age of Onset  . Heart Problems Brother   . Kidney disease Neg Hx   . Prostate cancer Neg Hx     Social History Social History   Tobacco Use  . Smoking status: Former Research scientist (life sciences)  . Smokeless tobacco: Never Used  . Tobacco comment: quit 40 years  Substance Use Topics  . Alcohol use: No    Alcohol/week: 0.0 standard drinks  . Drug use: No    Review of Systems Constitutional: Positive for chills Eyes: No visual changes. ENT: No sore throat. Cardiovascular: Denies chest pain. Respiratory: Positive for cough and dyspnea Gastrointestinal: No abdominal pain.  No nausea, no vomiting.  No diarrhea.  No constipation. Genitourinary: Negative for dysuria. Musculoskeletal: Negative for neck pain.  Negative for back pain. Integumentary: Negative for rash. Neurological: Negative for headaches, focal weakness or numbness.   ____________________________________________   PHYSICAL EXAM:  VITAL SIGNS: ED Triage Vitals  Enc Vitals Group     BP 12/18/18 2252 (!) 149/75     Pulse Rate 12/18/18 2252 97     Resp 12/19/18 0000 (!) 33     Temp 12/18/18 2252 98.4 F (36.9 C)     Temp Source 12/18/18 2252 Oral     SpO2 12/18/18 2252 95 %     Weight 12/18/18 2253 70.8 kg (156 lb)     Height 12/18/18 2253 1.676 m (5\' 6" )     Head Circumference --      Peak Flow --      Pain Score 12/18/18 2253 0     Pain Loc --      Pain Edu? --      Excl. in [place]? --     Constitutional: Alert and oriented. Well appearing and in no acute distress. Eyes: Conjunctivae are normal. Mouth/Throat: Mucous membranes are moist. Oropharynx non-erythematous. Neck: No stridor.   Cardiovascular: Normal rate, regular rhythm. Good peripheral circulation. Grossly normal heart sounds. Respiratory: Normal respiratory effort.  No retractions.  Diffuse rhonchi Gastrointestinal: Soft and nontender. No  distention.  Musculoskeletal: No lower extremity tenderness nor edema. No gross deformities of extremities. Neurologic:  Normal speech and language. No gross focal neurologic deficits are appreciated.  Skin:  Skin is warm, dry and intact. No rash noted. Psychiatric: Mood and affect are normal. Speech and behavior are normal.  ____________________________________________   LABS (all labs ordered are listed, but only abnormal results are displayed)  Labs Reviewed  INFLUENZA PANEL BY PCR (TYPE A & B) - Abnormal; Notable for the following components:      Result Value   Influenza B By PCR POSITIVE (*)    All other components within normal limits  CBC - Abnormal; Notable for the following components:   WBC 12.0 (*)    Hemoglobin 12.0 (*)    HCT 37.7 (*)    All other components within normal limits  COMPREHENSIVE METABOLIC PANEL - Abnormal; Notable for  the following components:   Glucose, Bld 126 (*)    GFR calc non Af Amer 58 (*)    All other components within normal limits  URINALYSIS, COMPLETE (UACMP) WITH MICROSCOPIC - Abnormal; Notable for the following components:   Color, Urine STRAW (*)    APPearance CLEAR (*)    All other components within normal limits  BASIC METABOLIC PANEL - Abnormal; Notable for the following components:   Glucose, Bld 109 (*)    Calcium 8.8 (*)    All other components within normal limits  CBC - Abnormal; Notable for the following components:   WBC 12.9 (*)    Hemoglobin 11.6 (*)    HCT 36.5 (*)    All other components within normal limits   ____________________________________________  EKG ED ECG REPORT I, Moultrie N Sevanna Ballengee, the attending physician, personally viewed and interpreted this ECG.   Date: 12/18/2018  EKG Time: 11:01 PM  Rate: 97  Rhythm: Normal sinus rhythm  Axis: Normal  Intervals: Normal  ST&T Change: None  ____________________________________________  RADIOLOGY I, Ozora N Aleiyah Halpin, personally viewed and evaluated these  images (plain radiographs) as part of my medical decision making, as well as reviewing the written report by the radiologist.  ED MD interpretation: Findings suggest bibasilar predominant fibrotic interstitial lung disease potentially progressive such as due to interstitial pneumonia per radiologist.  Official radiology report(s): Dg Chest 2 View  Result Date: 12/18/2018 CLINICAL DATA:  Cough and dyspnea EXAM: CHEST - 2 VIEW COMPARISON:  08/12/2017 chest radiograph. FINDINGS: Stable cardiomediastinal silhouette with normal heart size. No pneumothorax. No pleural effusion. Low lung volumes. Extensive patchy hazy and reticular opacities throughout both lungs, most prominent at the lung bases, which appear potentially worsened in the interval. IMPRESSION: Extensive patchy hazy and reticular opacities throughout both lungs, most prominent at the lung bases, which appear potentially worsened in the interval. Findings suggest basilar predominant fibrotic interstitial lung disease, potentially progressive, such as due to usual interstitial pneumonia (UIP). Electronically Signed   By: Ilona Sorrel M.D.   On: 12/18/2018 23:46    _____________________  Procedures   ____________________________________________   INITIAL IMPRESSION / ASSESSMENT AND PLAN / ED COURSE  As part of my medical decision making, I reviewed the following data within the electronic MEDICAL RECORD NUMBER   85 year old male presenting with above-stated history and physical exam concerning for influenza and pneumonia.  Patient noted to be influenza B positive chest x-ray consistent with possible interstitial pneumonia.  As such patient discussed with Dr. Marcille Blanco for hospital admission after receiving Tamiflu and ceftriaxone/azithromycin IV. ____________________________________________  FINAL CLINICAL IMPRESSION(S) / ED DIAGNOSES  Final diagnoses:  Influenza B  Pneumonia of both lungs due to infectious organism, unspecified part of  lung     MEDICATIONS GIVEN DURING THIS VISIT:  Medications  azithromycin (ZITHROMAX) 500 mg in sodium chloride 0.9 % 250 mL IVPB (500 mg Intravenous New Bag/Given 12/19/18 0719)  cefTRIAXone (ROCEPHIN) 1 g in sodium chloride 0.9 % 100 mL IVPB (has no administration in time range)  oseltamivir (TAMIFLU) capsule 30 mg (has no administration in time range)  aspirin EC tablet 81 mg (has no administration in time range)  simvastatin (ZOCOR) tablet 40 mg (has no administration in time range)  pantoprazole (PROTONIX) EC tablet 40 mg (has no administration in time range)  finasteride (PROSCAR) tablet 5 mg (has no administration in time range)  tamsulosin (FLOMAX) capsule 0.4 mg (has no administration in time range)  benzonatate (TESSALON) capsule 200 mg (has no  administration in time range)  mometasone-formoterol (DULERA) 200-5 MCG/ACT inhaler 2 puff (has no administration in time range)  ipratropium-albuterol (DUONEB) 0.5-2.5 (3) MG/3ML nebulizer solution 3 mL (has no administration in time range)  guaiFENesin-dextromethorphan (ROBITUSSIN DM) 100-10 MG/5ML syrup 5 mL (5 mLs Oral Given 12/19/18 0326)  acetaminophen (TYLENOL) tablet 650 mg (has no administration in time range)    Or  acetaminophen (TYLENOL) suppository 650 mg (has no administration in time range)  ondansetron (ZOFRAN) tablet 4 mg (has no administration in time range)    Or  ondansetron (ZOFRAN) injection 4 mg (has no administration in time range)  budesonide (PULMICORT) nebulizer solution 0.5 mg (has no administration in time range)  albuterol (PROVENTIL) (2.5 MG/3ML) 0.083% nebulizer solution 2.5 mg (2.5 mg Nebulization Not Given 12/19/18 0601)  enoxaparin (LOVENOX) injection 40 mg (has no administration in time range)  hydrocortisone cream 1 % 1 application (1 application Topical Given 12/19/18 0327)  oseltamivir (TAMIFLU) capsule 75 mg (75 mg Oral Given 12/19/18 0122)  cefTRIAXone (ROCEPHIN) 1 g in sodium chloride 0.9 % 100 mL IVPB (0  g Intravenous Stopped 12/19/18 0204)     ED Discharge Orders    None       Note:  This document was prepared using Dragon voice recognition software and may include unintentional dictation errors.    Gregor Hams, MD 12/19/18 [phone]

## 2018-12-19 NOTE — NC FL2 (Signed)
[place] LEVEL OF CARE SCREENING TOOL     IDENTIFICATION  Patient Name: Wesley Newton Birthdate: 01/19/1927 Sex: male Admission Date (Current Location): 12/18/2018  [place] and [place] Number:  Engineering geologist and Address:  [facility], [address] [place], [place] [address]      Provider Number: [id number]  Attending Physician Name and Address:  Henreitta Leber, MD  Relative Name and Phone Number:       Current Level of Care: Hospital Recommended Level of Care: [place] Prior Approval Number:    Date Approved/Denied:   PASRR Number: [id number])  Discharge Plan: SNF    Current Diagnoses: Patient Active Problem List   Diagnosis Date Noted  . Influenza B 12/19/2018  . CAP (community acquired pneumonia) 12/19/2018  . PAD (peripheral artery disease) ([place]) 04/25/2017  . BPH (benign prostatic hyperplasia) 01/10/2016  . Nocturia 01/10/2016  . Benign prostatic hyperplasia with urinary obstruction 01/10/2016  . AB (asthmatic bronchitis) 12/17/2014  . Carotid artery disease ([place]) 12/04/2014  . GERD (gastroesophageal reflux disease) 12/04/2014  . H/O respiratory system disease 12/04/2014  . H/O peptic ulcer 12/04/2014  . Hypercholesterolemia 12/04/2014  . Unstable angina pectoris ([place]) 12/04/2014  . Amnesia 12/04/2014  . Chronic obstructive pulmonary disease ([place]) 06/11/2014  . Cephalalgia 06/11/2014  . HTN (hypertension) 06/11/2014    Orientation RESPIRATION BLADDER Height & Weight     Self, Time, Situation, Place  Normal Continent Weight: 156 lb (70.8 kg) Height:  5\' 6"  (167.6 cm)  BEHAVIORAL SYMPTOMS/MOOD NEUROLOGICAL BOWEL NUTRITION STATUS      Continent Diet(Diet: Heart Healthy )  AMBULATORY STATUS COMMUNICATION OF NEEDS Skin   Extensive Assist Verbally Normal                       Personal Care Assistance Level of Assistance  Bathing, Feeding, Dressing Bathing Assistance: Limited  assistance Feeding assistance: Independent Dressing Assistance: Limited assistance     Functional Limitations Info  Sight, Hearing, Speech Sight Info: Adequate Hearing Info: Impaired Speech Info: Adequate    SPECIAL CARE FACTORS FREQUENCY  PT (By licensed PT), OT (By licensed OT)   Patient was positive for influenza B 12/18/18       PT Frequency: (5) OT Frequency: (5)            Contractures      Additional Factors Info  Code Status, Allergies Code Status Info: (Full Code. ) Allergies Info: (Amoxicillin, Codeine, Rapaflo Silodosin, Tramadol, Trimethoprim)           Current Medications (12/19/2018):  This is the current hospital active medication list Current Facility-Administered Medications  Medication Dose Route Frequency Provider Last Rate Last Dose  . acetaminophen (TYLENOL) tablet 650 mg  650 mg Oral Q6H PRN Lance Coon, MD       Or  . acetaminophen (TYLENOL) suppository 650 mg  650 mg Rectal Q6H PRN Lance Coon, MD      . albuterol (PROVENTIL) (2.5 MG/3ML) 0.083% nebulizer solution 2.5 mg  2.5 mg Nebulization Q2H PRN Henreitta Leber, MD      . aspirin EC tablet 81 mg  81 mg Oral Daily Lance Coon, MD   81 mg at 12/19/18 1010  . azithromycin (ZITHROMAX) 500 mg in sodium chloride 0.9 % 250 mL IVPB  500 mg Intravenous Q24H Lance Coon, MD 250 mL/hr at 12/19/18 0719 500 mg at 12/19/18 0719  . benzonatate (TESSALON) capsule 200 mg  200 mg Oral  TID PRN Lance Coon, MD      . budesonide (PULMICORT) nebulizer solution 0.5 mg  0.5 mg Nebulization Daily Lance Coon, MD   0.5 mg at 12/19/18 0847  . [START ON 12/20/2018] cefTRIAXone (ROCEPHIN) 1 g in sodium chloride 0.9 % 100 mL IVPB  1 g Intravenous Q24H Lance Coon, MD      . enoxaparin (LOVENOX) injection 40 mg  40 mg Subcutaneous Q24H Lance Coon, MD   40 mg at 12/19/18 1010  . finasteride (PROSCAR) tablet 5 mg  5 mg Oral Daily Lance Coon, MD   5 mg at 12/19/18 1010  . guaiFENesin-dextromethorphan  (ROBITUSSIN DM) 100-10 MG/5ML syrup 5 mL  5 mL Oral Q4H PRN Lance Coon, MD   5 mL at 12/19/18 1011  . hydrocortisone cream 1 % 1 application  1 application Topical TID PRN Lance Coon, MD   1 application at 99/24/26 0327  . ipratropium-albuterol (DUONEB) 0.5-2.5 (3) MG/3ML nebulizer solution 3 mL  3 mL Nebulization QID Henreitta Leber, MD   3 mL at 12/19/18 1138  . ondansetron (ZOFRAN) tablet 4 mg  4 mg Oral Q6H PRN Lance Coon, MD       Or  . ondansetron [facility]) injection 4 mg  4 mg Intravenous Q6H PRN Lance Coon, MD      . oseltamivir (TAMIFLU) capsule 30 mg  30 mg Oral BID Lance Coon, MD      . pantoprazole (PROTONIX) EC tablet 40 mg  40 mg Oral Daily Lance Coon, MD   40 mg at 12/19/18 1010  . simvastatin (ZOCOR) tablet 40 mg  40 mg Oral Corwin Levins, MD      . tamsulosin [facility]) capsule 0.4 mg  0.4 mg Oral Daily Lance Coon, MD   0.4 mg at 12/19/18 1010     Discharge Medications: Please see discharge summary for a list of discharge medications.  Relevant Imaging Results:  Relevant Lab Results: Patient was positive for influenza B 12/18/18   Additional Information (SSN: [id number])  Neida Ellegood, Veronia Beets, LCSW

## 2018-12-19 NOTE — H&P (Signed)
[facility] [place] NAME: Wesley Newton    MR#:  [id number]  DATE OF BIRTH:  May 14, 1927  DATE OF ADMISSION:  12/18/2018  PRIMARY CARE PHYSICIAN: Baxter Hire, MD   REQUESTING/REFERRING PHYSICIAN: Owens Shark, MD  CHIEF COMPLAINT:   Chief Complaint  Patient presents with  . Chills  . Shortness of Breath    HISTORY OF PRESENT ILLNESS:  Wesley Newton  is a 85 y.o. male who presents with chief complaint as above.  Patient presents with complaint of fever, chills, shortness of breath and cough today.  He states that 3 to 4 days ago he developed some diarrhea, which was short-lived and self resolved.  Today he developed chills, fever, cough.  He came to the ED for evaluation is found to have opacities on chest x-ray, and is positive for influenza B.  Hospitalist were called for admission  PAST MEDICAL HISTORY:   Past Medical History:  Diagnosis Date  . Asthma   . BPH (benign prostatic hyperplasia)   . Bronchitis   . Cancer ([place])   . COPD (chronic obstructive pulmonary disease) ([place])   . DVT of leg (deep venous thrombosis) ([place])   . Elevated PSA   . Emphysema lung ([place])   . Emphysema of lung ([place])   . Hyperlipidemia   . Nocturia   . Pulmonary fibrosis ([place])   . Pulmonary fibrosis ([place])   . Skin cancer      PAST SURGICAL HISTORY:   Past Surgical History:  Procedure Laterality Date  . CATARACT EXTRACTION    . HERNIA REPAIR    . TONSILLECTOMY       SOCIAL HISTORY:   Social History   Tobacco Use  . Smoking status: Former Research scientist (life sciences)  . Smokeless tobacco: Never Used  . Tobacco comment: quit 40 years  Substance Use Topics  . Alcohol use: No    Alcohol/week: 0.0 standard drinks     FAMILY HISTORY:   Family History  Problem Relation Age of Onset  . Heart Problems Brother   . Kidney disease Neg Hx   . Prostate cancer Neg Hx      DRUG ALLERGIES:   Allergies  Allergen Reactions  . Amoxicillin   . Codeine  Nausea And Vomiting  . Rapaflo [Silodosin] Other (See Comments)    Reaction:  Unknown   . Tramadol Nausea And Vomiting and Other (See Comments)    Pt states that this medication makes him feel crazy.    . Trimethoprim     MEDICATIONS AT HOME:   Prior to Admission medications   Medication Sig Start Date End Date Taking? Authorizing Provider  albuterol (PROVENTIL HFA;VENTOLIN HFA) 108 (90 Base) MCG/ACT inhaler Inhale 2 puffs into the lungs every 6 (six) hours as needed for wheezing or shortness of breath.    [provider]  albuterol (PROVENTIL) (2.5 MG/3ML) 0.083% nebulizer solution Take 2.5 mg by nebulization every 6 (six) hours as needed for wheezing or shortness of breath.    [provider]  aspirin EC 81 MG tablet Take 81 mg by mouth daily.     [provider]  azelastine (ASTELIN) 0.1 % nasal spray  03/25/16   [provider]  benzonatate (TESSALON) 100 MG capsule Take 100 mg by mouth 3 (three) times daily as needed for cough.    [provider]  budesonide (PULMICORT) 0.5 MG/2ML nebulizer solution Inhale into the lungs. 03/20/17 03/20/18  [provider]  budesonide-formoterol (SYMBICORT)  160-4.5 MCG/ACT inhaler Inhale 2 puffs into the lungs 2 (two) times daily.    [provider]  finasteride (PROSCAR) 5 MG tablet Take 1 tablet (5 mg total) by mouth daily. 02/07/18   Zara Council A, PA-C  finasteride (PROSCAR) 5 MG tablet TAKE 1 TABLET BY MOUTH ONCE DAILY 03/12/18   McGowan, Larene Beach A, PA-C  fluticasone (FLONASE) 50 MCG/ACT nasal spray Place into the nose. 01/08/16 01/07/17  [provider]  gabapentin (NEURONTIN) 100 MG capsule Take 200 mg by mouth 2 (two) times daily. 03/27/17   [provider]  LORazepam (ATIVAN) 0.5 MG tablet  03/28/17   [provider]  MAGNESIUM PO Take by mouth.    [provider]  Multiple Vitamins-Minerals (PRESERVISION AREDS 2 PO) Take 1 capsule by mouth 2 (two) times  daily.    [provider]  naproxen (NAPROSYN) 375 MG tablet Take 375 mg by mouth 3 (three) times daily as needed for mild pain.     [provider]  omeprazole (PRILOSEC) 40 MG capsule Take 40 mg by mouth daily.     [provider]  simvastatin (ZOCOR) 40 MG tablet Take 40 mg by mouth at bedtime.     [provider]  tamsulosin (FLOMAX) 0.4 MG CAPS capsule Take 1 capsule (0.4 mg total) by mouth daily. 02/07/17   Zara Council A, PA-C    REVIEW OF SYSTEMS:  Review of Systems  Constitutional: Positive for chills, fever and malaise/fatigue. Negative for weight loss.  HENT: Negative for ear pain, hearing loss and tinnitus.   Eyes: Negative for blurred vision, double vision, pain and redness.  Respiratory: Positive for cough, sputum production and shortness of breath. Negative for hemoptysis.   Cardiovascular: Negative for chest pain, palpitations, orthopnea and leg swelling.  Gastrointestinal: Negative for abdominal pain, constipation, diarrhea, nausea and vomiting.  Genitourinary: Negative for dysuria, frequency and hematuria.  Musculoskeletal: Negative for back pain, joint pain and neck pain.  Skin:       No acne, rash, or lesions  Neurological: Negative for dizziness, tremors, focal weakness and weakness.  Endo/Heme/Allergies: Negative for polydipsia. Does not bruise/bleed easily.  Psychiatric/Behavioral: Negative for depression. The patient is not nervous/anxious and does not have insomnia.      VITAL SIGNS:   Vitals:   12/18/18 2252 12/18/18 2253  BP: (!) 149/75   Pulse: 97   Temp: 98.4 F (36.9 C)   TempSrc: Oral   SpO2: 95%   Weight:  70.8 kg  Height:  5\' 6"  (1.676 m)   Wt Readings from Last 3 Encounters:  12/18/18 70.8 kg  07/24/18 72.3 kg  02/07/18 74.9 kg    PHYSICAL EXAMINATION:  Physical Exam  Vitals reviewed. Constitutional: He is oriented to person, place, and time. He appears well-developed and well-nourished. No  distress.  HENT:  Head: Normocephalic and atraumatic.  Mouth/Throat: Oropharynx is clear and moist.  Eyes: Pupils are equal, round, and reactive to light. Conjunctivae and EOM are normal. No scleral icterus.  Neck: Normal range of motion. Neck supple. No JVD present. No thyromegaly present.  Cardiovascular: Normal rate, regular rhythm and intact distal pulses. Exam reveals no gallop and no friction rub.  No murmur heard. Respiratory: Effort normal. No respiratory distress. He has no wheezes. He has no rales.  Bilateral fine rhonchi  GI: Soft. Bowel sounds are normal. He exhibits no distension. There is no abdominal tenderness.  Musculoskeletal: Normal range of motion.        General: No  edema.     Comments: No arthritis, no gout  Lymphadenopathy:    He has no cervical adenopathy.  Neurological: He is alert and oriented to person, place, and time. No cranial nerve deficit.  No dysarthria, no aphasia  Skin: Skin is warm and dry. No rash noted. No erythema.  Psychiatric: He has a normal mood and affect. His behavior is normal. Judgment and thought content normal.    LABORATORY PANEL:   CBC Recent Labs  Lab 12/18/18 2315  WBC 12.0*  HGB 12.0*  HCT 37.7*  PLT 286   ------------------------------------------------------------------------------------------------------------------  Chemistries  Recent Labs  Lab 12/18/18 2315  NA 139  K 3.9  CL 104  CO2 27  GLUCOSE 126*  BUN 13  CREATININE 1.12  CALCIUM 8.9  AST 27  ALT 18  ALKPHOS 72  BILITOT 0.7   ------------------------------------------------------------------------------------------------------------------  Cardiac Enzymes No results for input(s): TROPONINI in the last 168 hours. ------------------------------------------------------------------------------------------------------------------  RADIOLOGY:  Dg Chest 2 View  Result Date: 12/18/2018 CLINICAL DATA:  Cough and dyspnea EXAM: CHEST - 2 VIEW  COMPARISON:  08/12/2017 chest radiograph. FINDINGS: Stable cardiomediastinal silhouette with normal heart size. No pneumothorax. No pleural effusion. Low lung volumes. Extensive patchy hazy and reticular opacities throughout both lungs, most prominent at the lung bases, which appear potentially worsened in the interval. IMPRESSION: Extensive patchy hazy and reticular opacities throughout both lungs, most prominent at the lung bases, which appear potentially worsened in the interval. Findings suggest basilar predominant fibrotic interstitial lung disease, potentially progressive, such as due to usual interstitial pneumonia (UIP). Electronically Signed   By: Ilona Sorrel M.D.   On: 12/18/2018 23:46    EKG:   Orders placed or performed during the hospital encounter of 12/18/18  . EKG 12-Lead  . EKG 12-Lead  . EKG 12-Lead  . EKG 12-Lead    IMPRESSION AND PLAN:  Principal Problem:   Influenza B -likely contributed to development of pneumonia.  We have started Tamiflu, supportive treatment Active Problems:   CAP (community acquired pneumonia) -IV antibiotics, supportive treatment   Interstitial lung disease -continue scheduled home inhalers and nebulizers   HTN (hypertension) -home dose antihypertensives   BPH (benign prostatic hyperplasia) -continue home medications   GERD (gastroesophageal reflux disease) -home dose PPI   Hypercholesterolemia -Home dose antilipid  Chart review performed and case discussed with ED provider. Labs, imaging and/or ECG reviewed by provider and discussed with patient/family. Management plans discussed with the patient and/or family.  DVT PROPHYLAXIS: SubQ lovenox   GI PROPHYLAXIS:  PPI   ADMISSION STATUS: Inpatient     CODE STATUS: Full  TOTAL TIME TAKING CARE OF THIS PATIENT: 45 minutes.   Jannifer Franklin, Claxton Levitz [place] 12/19/2018, 12:46 AM  [facility]  Office  [phone]  CC: Primary care physician; Baxter Hire, MD  Note:  This  document was prepared using Dragon voice recognition software and may include unintentional dictation errors.

## 2018-12-19 NOTE — Progress Notes (Signed)
[name] NAME: Wesley Newton    MR#:  [id number]  DATE OF BIRTH:  1927/08/29  SUBJECTIVE:   Patient admitted to the hospital secondary shortness of breath, chills/fever and noted to have flu.  Patient was positive for influenza B.  Having some upper airway rattling with some exertional dyspnea.  REVIEW OF SYSTEMS:    Review of Systems  Constitutional: Negative for chills and fever.  HENT: Negative for congestion and tinnitus.   Eyes: Negative for blurred vision and double vision.  Respiratory: Positive for cough, sputum production and shortness of breath. Negative for wheezing.   Cardiovascular: Negative for chest pain, orthopnea and PND.  Gastrointestinal: Negative for abdominal pain, diarrhea, nausea and vomiting.  Genitourinary: Negative for dysuria and hematuria.  Neurological: Negative for dizziness, sensory change and focal weakness.  All other systems reviewed and are negative.   Nutrition: Heart Healthy Tolerating Diet: Yes Tolerating PT: Await Eval.    DRUG ALLERGIES:   Allergies  Allergen Reactions  . Amoxicillin Other (See Comments)    DID THE REACTION INVOLVE: Swelling of the face/tongue/throat, SOB, or low BP? Unknown Sudden or severe rash/hives, skin peeling, or the inside of the mouth or nose? Unknown Did it require medical treatment? Unknown When did it last happen?Unknown If all above answers are "NO", may proceed with cephalosporin use.   . Codeine Nausea And Vomiting  . Rapaflo [Silodosin] Other (See Comments)    Reaction:  Unknown   . Tramadol Nausea And Vomiting and Other (See Comments)    Pt states that this medication makes him feel crazy.    . Trimethoprim Other (See Comments)    Reaction: unknown    VITALS:  Blood pressure (!) 103/54, pulse 97, temperature 98.2 F (36.8 C), temperature source Oral, resp. rate 20, height 5\' 6"  (1.676 m), weight 70.8 kg, SpO2 94 %.  PHYSICAL  EXAMINATION:   Physical Exam  GENERAL:  85 y.o.-year-old patient lying in bed in mild Resp. Distress. Mar[place] Kitchen  EYES: Pupils equal, round, reactive to light and accommodation. No scleral icterus. Extraocular muscles intact.  HEENT: Head atraumatic, normocephalic. Oropharynx and nasopharynx clear.  NECK:  Supple, no jugular venous distention. No thyroid enlargement, no tenderness.  LUNGS: Good a/e b/l.  Diffuse rhonchi, and expiratory wheezing bilaterally, no rales.  Negative use of accessory muscles. CARDIOVASCULAR: S1, S2 normal. No murmurs, rubs, or gallops.  ABDOMEN: Soft, nontender, nondistended. Bowel sounds present. No organomegaly or mass.  EXTREMITIES: No cyanosis, clubbing or edema b/l.    NEUROLOGIC: Cranial nerves II through XII are intact. No focal Motor or sensory deficits b/l.   PSYCHIATRIC: The patient is alert and oriented x 3.  SKIN: No obvious rash, lesion, or ulcer.    LABORATORY PANEL:   CBC Recent Labs  Lab 12/19/18 0354  WBC 12.9*  HGB 11.6*  HCT 36.5*  PLT 265   ------------------------------------------------------------------------------------------------------------------  Chemistries  Recent Labs  Lab 12/18/18 2315 12/19/18 0354  NA 139 141  K 3.9 4.0  CL 104 107  CO2 27 27  GLUCOSE 126* 109*  BUN 13 12  CREATININE 1.12 1.06  CALCIUM 8.9 8.8*  AST 27  --   ALT 18  --   ALKPHOS 72  --   BILITOT 0.7  --    ------------------------------------------------------------------------------------------------------------------  Cardiac Enzymes No results for input(s): TROPONINI in the last 168 hours. ------------------------------------------------------------------------------------------------------------------  RADIOLOGY:  Dg Chest 2 View  Result Date: 12/18/2018 CLINICAL  DATA:  Cough and dyspnea EXAM: CHEST - 2 VIEW COMPARISON:  08/12/2017 chest radiograph. FINDINGS: Stable cardiomediastinal silhouette with normal heart size. No pneumothorax. No  pleural effusion. Low lung volumes. Extensive patchy hazy and reticular opacities throughout both lungs, most prominent at the lung bases, which appear potentially worsened in the interval. IMPRESSION: Extensive patchy hazy and reticular opacities throughout both lungs, most prominent at the lung bases, which appear potentially worsened in the interval. Findings suggest basilar predominant fibrotic interstitial lung disease, potentially progressive, such as due to usual interstitial pneumonia (UIP). Electronically Signed   By: Ilona Sorrel M.D.   On: 12/18/2018 23:46     ASSESSMENT AND PLAN:   85 year old male with past medical history of interstitial lung disease, COPD, previous history of DVT, BPH, hyperlipidemia presented to the hospital due to shortness of breath, cough, chills.  1.  Acute on chronic respiratory failure with hypoxia-secondary to underlying pulmonary fibrosis complicated with flu. -Patient was positive for influenza B.  Continue Tamiflu, O2 supplementation, continue duo nebs, Pulmicort nebs.  2.  Flu-patient is positive for influenza B. -Continue airborne precautions, continue Tamiflu.    3.  Pulmonary fibrosis/interstitial lung disease-mild acute exacerbation due to flu/superimposed pneumonia. -Continue Tamiflu, IV antibiotics with ceftriaxone, Zithromax.  Continue Pulmicort nebs, duo nebs. -Patient is already on oxygen at home.    4.  GERD-continue Protonix.  5.  Hyperlipidemia-continue simvastatin.  6.  BPH-continue Flomax, finasteride  Await PT eval.   All the records are reviewed and case discussed with Care Management/Social Worker. Management plans discussed with the patient, family and they are in agreement.  CODE STATUS: Full code  DVT Prophylaxis: Lovenox  TOTAL TIME TAKING CARE OF THIS PATIENT: 30 minutes.   POSSIBLE D/C IN 1-2 DAYS, DEPENDING ON CLINICAL CONDITION.   Henreitta Leber M.D on 12/19/2018 at 3:14 PM  Between 7am to 6pm - Pager -  [phone]  After 6pm go to [email or web address] - Proofreader  [facility]  Office  [phone]  CC: Primary care physician; Baxter Hire, MD

## 2018-12-19 NOTE — Progress Notes (Signed)
   12/19/18 1000  Clinical Encounter Type  Visited With Patient;Family (Daughter)  Visit Type Initial;Spiritual support (OR for spiritual care)  Referral From Nurse  Recommendations Follow-up, as needed.  Spiritual Encounters  Spiritual Needs Prayer;Emotional  Stress Factors  Patient Stress Factors Health changes   Chaplain encountered patient and daughter and offered active and empathetic listening. Patient accepted prayer and Chaplain concluded visit with an offer to return as needed.

## 2018-12-19 NOTE — Clinical Social Work Note (Signed)
Clinical Social Work Assessment  Patient Details  Name: Wesley Newton MRN: [id number] Date of Birth: 02/02/1927  Date of referral:  12/19/18               Reason for consult:  Facility Placement                Permission sought to share information with:  Chartered certified accountant granted to share information::  Yes, Verbal Permission Granted  Name::      [facility]::     Relationship::     Contact Information:     Housing/Transportation Living arrangements for the past 2 months:  Charity fundraiser of Information:  Patient Patient Interpreter Needed:  None Criminal Activity/Legal Involvement Pertinent to Current Situation/Hospitalization:  No - Comment as needed Significant Relationships:  Adult Children Lives with:  Self Do you feel safe going back to the place where you live?  Yes Need for family participation in patient care:  Yes (Comment)  Care giving concerns:  Patient lives alone in an apartment at [facility] independent living.    Social Worker assessment / plan:  Holiday representative ([facility]) reviewed chart and noted that patient is from [facility] independent living. CSW left [facility] a voicemail. Per patient he lives alone at [facility] independent living and believes that he will need to go to the SNF section. Per patient he can't take care of himself right now. CSW explained that [place] will have to approve SNF. Patient verbalized his understanding. FL2 complete and sent to [facility]. CSW will continue to follow and assist as needed.    Employment status:  Disabled (Comment on whether or not currently receiving Disability), Retired Nurse, adult PT Recommendations:  Not assessed at this time Information / Referral to community resources:  [place]  Patient/Family's Response to care:  Patient requested to go to [facility] section.    Patient/Family's Understanding of and Emotional Response to Diagnosis, Current Treatment, and Prognosis:  Patient was very pleasant and thanked CSW for assistance.   Emotional Assessment Appearance:  Appears stated age Attitude/Demeanor/Rapport:    Affect (typically observed):  Accepting, Adaptable, Pleasant Orientation:  Oriented to Self, Oriented to Place, Oriented to  Time, Oriented to Situation Alcohol / Substance use:  Not Applicable Psych involvement (Current and /or in the community):  No (Comment)  Discharge Needs  Concerns to be addressed:  Discharge Planning Concerns Readmission within the last 30 days:  No Current discharge risk:  Dependent with Mobility Barriers to Discharge:  Continued Medical Work up   [employer], Veronia Beets, LCSW 12/19/2018, 2:01 PM

## 2018-12-19 NOTE — Clinical Social Work Placement (Signed)
   CLINICAL SOCIAL WORK PLACEMENT  NOTE  Date:  12/19/2018  Patient Details  Name: Wesley Newton MRN: [id number] Date of Birth: 1927-10-12  Clinical Social Work is seeking post-discharge placement for this patient at the [place] level of care (*CSW will initial, date and re-position this form in  chart as items are completed):  Yes   Patient/family provided with [facility] Work Department's list of facilities offering this level of care within the geographic area requested by the patient (or if unable, by the patient's family).  Yes   Patient/family informed of their freedom to choose among providers that offer the needed level of care, that participate in Medicare, Medicaid or managed care program needed by the patient, have an available bed and are willing to accept the patient.  Yes   Patient/family informed of [facility]'s ownership interest in [facility] and [facility], as well as of the fact that they are under no obligation to receive care at these facilities.  PASRR submitted to EDS on       PASRR number received on       Existing PASRR number confirmed on 12/19/18     FL2 transmitted to all facilities in geographic area requested by pt/family on 12/19/18     FL2 transmitted to all facilities within larger geographic area on       Patient informed that his/her managed care company has contracts with or will negotiate with certain facilities, including the following:            Patient/family informed of bed offers received.  Patient chooses bed at       Physician recommends and patient chooses bed at      Patient to be transferred to   on  .  Patient to be transferred to facility by       Patient family notified on   of transfer.  Name of family member notified:        PHYSICIAN       Additional Comment:    _______________________________________________ Mcdonald Reiling, Veronia Beets, LCSW 12/19/2018, 2:00 PM

## 2018-12-20 LAB — CBC
HEMATOCRIT: 35.5 % — AB (ref 39.0–52.0)
Hemoglobin: 11.4 g/dL — ABNORMAL LOW (ref 13.0–17.0)
MCH: 27.2 pg (ref 26.0–34.0)
MCHC: 32.1 g/dL (ref 30.0–36.0)
MCV: 84.7 fL (ref 80.0–100.0)
Platelets: 266 10*3/uL (ref 150–400)
RBC: 4.19 MIL/uL — ABNORMAL LOW (ref 4.22–5.81)
RDW: 14.9 % (ref 11.5–15.5)
WBC: 13.2 10*3/uL — ABNORMAL HIGH (ref 4.0–10.5)
nRBC: 0 % (ref 0.0–0.2)

## 2018-12-20 LAB — HEPATIC FUNCTION PANEL
ALT: 14 U/L (ref 0–44)
AST: 22 U/L (ref 15–41)
Albumin: 2.9 g/dL — ABNORMAL LOW (ref 3.5–5.0)
Alkaline Phosphatase: 69 U/L (ref 38–126)
Bilirubin, Direct: <0.1 mg/dL (ref 0.0–0.2)
Total Bilirubin: 0.6 mg/dL (ref 0.3–1.2)
Total Protein: 6.2 g/dL — ABNORMAL LOW (ref 6.5–8.1)

## 2018-12-20 LAB — LIPASE, BLOOD: Lipase: 29 U/L (ref 11–51)

## 2018-12-20 MED ORDER — LOPERAMIDE HCL 2 MG PO CAPS: 2.0000 mg | ORAL_CAPSULE | Freq: Four times a day (QID) | ORAL | Status: DC | PRN

## 2018-12-20 MED ORDER — ENSURE ENLIVE PO LIQD: 237.0000 mL | Freq: Two times a day (BID) | ORAL | Status: DC

## 2018-12-20 MED ORDER — SALINE SPRAY 0.65 % NA SOLN
1.0000 | NASAL | Status: DC | PRN
  Administered 2018-12-20: 1 via NASAL
  Filled 2018-12-20: qty 44

## 2018-12-20 MED ORDER — DOXYCYCLINE HYCLATE 100 MG PO TABS
100.0000 mg | ORAL_TABLET | Freq: Two times a day (BID) | ORAL | Status: DC
  Administered 2018-12-21: 12:00:00 100 mg via ORAL
  Filled 2018-12-20 (×2): qty 1

## 2018-12-20 NOTE — Progress Notes (Signed)
Initial Nutrition Assessment  DOCUMENTATION CODES:   Non-severe (moderate) malnutrition in context of chronic illness  INTERVENTION:  Recommend liberalizing diet to regular.  Provide Ensure Enlive po BID, each supplement provides 350 kcal and 20 grams of protein. Patient prefers strawberry.  Encouraged adequate intake of calories and protein at meals. Discussed catabolic nature of COPD and increased needs for calories/protein.  NUTRITION DIAGNOSIS:   Moderate Malnutrition related to chronic illness(COPD) as evidenced by moderate fat depletion, mild muscle depletion, moderate muscle depletion.  GOAL:   Patient will meet greater than or equal to 90% of their needs  MONITOR:   PO intake, Supplement acceptance, Labs, Weight trends, I & O's  REASON FOR ASSESSMENT:   Malnutrition Screening Tool    ASSESSMENT:   85 year old male with PMHx of emphysema of lung, pulmonary fibrosis, COPD, BPH, HLD, asthma who is now admitted with acute on chronic respiratory failure with hypoxia secondary to flu.   Met with patient at bedside. He reports he has had a decreased appetite the past 2-3 days with the flu. He reports he knows it is important to eat so he is still trying to eat at least 50% of his meals. He reports a general decline in his appetite over the past 1+ year, but he is trying to still eat 2-3 meals per day at home. He has not added in ONS yet but is amenable to this after discussing increased calorie/protein needs with COPD.   UBW 180 lbs (81.8 kg). Per chart patient was 80.3 kg on 12/01/2016. He is currently 70.8 kg (156 lbs). He has lost 9.5 kg (11.8% body weight) over 2 years, which is not significant for time frame. It is still not ideal to lose weight unintentionally or lose lean body mass.  Medications reviewed and include: doxycycline, Tamiflu, pantoprazole, Flomax.  Labs reviewed.  NUTRITION - FOCUSED PHYSICAL EXAM:    Most Recent Value  Orbital Region  Moderate  depletion  Upper Arm Region  Moderate depletion  Thoracic and Lumbar Region  Mild depletion  Buccal Region  Moderate depletion  Temple Region  Moderate depletion  Clavicle Bone Region  Mild depletion  Clavicle and Acromion Bone Region  Mild depletion  Scapular Bone Region  Mild depletion  Dorsal Hand  Moderate depletion  Patellar Region  Moderate depletion  Anterior Thigh Region  Moderate depletion  Posterior Calf Region  Moderate depletion  Edema (RD Assessment)  Mild  Hair  Reviewed  Eyes  Reviewed  Mouth  Reviewed  Skin  Reviewed  Nails  Reviewed     Diet Order:   Diet Order            Diet Heart Room service appropriate? Yes; Fluid consistency: Thin  Diet effective now             EDUCATION NEEDS:   Education needs have been addressed  Skin:  Skin Assessment: Reviewed RN Assessment  Last BM:  12/20/2017 - medium type 7  Height:   Ht Readings from Last 1 Encounters:  12/18/18 _0  (1.676 m)   Weight:   Wt Readings from Last 1 Encounters:  12/18/18 70.8 kg   Ideal Body Weight:     BMI:  Body mass index is 25.18 kg/m.  Estimated Nutritional Needs:   Kcal:  1600-1850 (MSJ x 1.2-1.4)  Protein:  85-100 grams (1.2-1.4 grams/kg)  Fluid:  1.6-1.8 L/day  Willey Blade, MS, RD, LDN Office: [phone] Pager: [phone] After Hours/Weekend Pager: [phone]

## 2018-12-20 NOTE — Evaluation (Signed)
Physical Therapy Evaluation Patient Details Name: Wesley Newton MRN: [id number] DOB: 1927-11-04 Today's Date: 12/20/2018   History of Present Illness  85 y/o male here with the flu, feels generally weak but confident with mobility/standing tasks in the room.   Clinical Impression  As PT entered room pt was exiting the bathroom with good confident and safety.  He was able to ambulate in the room with issues and don underwear at EOB.  He showed good strength (apart from chronic L shoulder limitations) and general mobility.  He was able to circumambulate the nurses' station with safe, but slower than baseline ambulation.  O2 sats remained relatively stable t/o the session but were down to 87% post ambulation on room air.  Pt has some family that will be able to assist with errands initially, overall did well and should be able to return safely to his apartment once he is medically ready to d/c.     Follow Up Recommendations Home health PT    Equipment Recommendations  None recommended by PT    Recommendations for Other Services       Precautions / Restrictions Precautions Precautions: None Restrictions Weight Bearing Restrictions: No      Mobility  Bed Mobility Overal bed mobility: Independent             General bed mobility comments: able to get in/out of bed w/o assist, able to don underwear w/o assist at EOB  Transfers Overall transfer level: Independent Equipment used: None             General transfer comment: Pt able to rise and sit with good confidence, safety, etc  Ambulation/Gait Ambulation/Gait assistance: Independent Gait Distance (Feet): 200 Feet Assistive device: None       General Gait Details: Pt with consistent cadence, speed and overall confident.  His O2 (on room air, mask donned in hallway) slowly dropped from 93 to 88% but remained relatively stable and did not have excessive fatigue  Stairs            Wheelchair Mobility     Modified Rankin (Stroke Patients Only)       Balance Overall balance assessment: Independent                                           Pertinent Vitals/Pain Pain Assessment: (generally feeling poorly, chest pain from coughing)    Home Living Family/patient expects to be discharged to:: Other (Comment) Living Arrangements: Alone               Additional Comments: lives at [facility] independent apartments    Prior Function Level of Independence: Independent         Comments: Pt drives, is regularly out of the home, will do stairs (not elevator) for exercises when feeling good.     Hand Dominance        Extremity/Trunk Assessment   Upper Extremity Assessment Upper Extremity Assessment: Overall WFL for tasks assessed(chronic L shoulder limitations, otherwise functional t/o )    Lower Extremity Assessment Lower Extremity Assessment: Overall WFL for tasks assessed       Communication   Communication: No difficulties  Cognition Arousal/Alertness: Awake/alert Behavior During Therapy: WFL for tasks assessed/performed Overall Cognitive Status: Within Functional Limits for tasks assessed  General Comments      Exercises     Assessment/Plan    PT Assessment Patient needs continued PT services  PT Problem List Decreased activity tolerance;Cardiopulmonary status limiting activity       PT Treatment Interventions Gait training;Functional mobility training;Stair training;Therapeutic activities;Therapeutic exercise;Balance training;Patient/family education    PT Goals (Current goals can be found in the Care Plan section)  Acute Rehab PT Goals Patient Stated Goal: get breathing/feeling better and go home PT Goal Formulation: With patient Time For Goal Achievement: 01/10/19 Potential to Achieve Goals: Good    Frequency Min 2X/week   Barriers to discharge        Co-evaluation                AM-PAC PT "6 Clicks" Mobility  Outcome Measure Help needed turning from your back to your side while in a flat bed without using bedrails?: None Help needed moving from lying on your back to sitting on the side of a flat bed without using bedrails?: None Help needed moving to and from a bed to a chair (including a wheelchair)?: None Help needed standing up from a chair using your arms (e.g., wheelchair or bedside chair)?: None Help needed to walk in hospital room?: None Help needed climbing 3-5 steps with a railing? : None 6 Click Score: 24    End of Session Equipment Utilized During Treatment: Gait belt Activity Tolerance: Patient limited by fatigue;Patient tolerated treatment well Patient left: in chair;with call bell/phone within reach Nurse Communication: Mobility status PT Visit Diagnosis: Muscle weakness (generalized) (M62.81);Difficulty in walking, not elsewhere classified (R26.2)    Time: 3845-3646 PT Time Calculation (min) (ACUTE ONLY): 20 min   Charges:   PT Evaluation $PT Eval Low Complexity: 1 Low         Kreg Shropshire, DPT 12/20/2018, 3:20 PM

## 2018-12-20 NOTE — Progress Notes (Signed)
PT Cancellation Note  Patient Details Name: Wesley Newton MRN: [id number] DOB: 09/16/1927   Cancelled Treatment:    Reason Eval/Treat Not Completed: Patient at procedure or test/unavailable Attempted to see pt X 4 this AM, first he was eating, second he was using the bathroom, third he was getting a breathing treatment and fourth he was feeling nauseous and was to get Zofran.  Will try back this afternoon as time allows and pt is able.  Kreg Shropshire, DPT 12/20/2018, 10:15 AM

## 2018-12-20 NOTE — Progress Notes (Signed)
[place] [facility] [place] NAME: Wesley Newton    MR#:  [id number]  DATE OF BIRTH:  [birth year]  SUBJECTIVE:   Patient admitted to the hospital secondary shortness of breath, chills/fever and noted to have flu.  Patient was positive for influenza B.  Cough has improved.  Having some Nausea,diarrhea this a.m.   REVIEW OF SYSTEMS:    Review of Systems  Constitutional: Negative for chills and fever.  HENT: Negative for congestion and tinnitus.   Eyes: Negative for blurred vision and double vision.  Respiratory: Positive for cough and shortness of breath. Negative for sputum production and wheezing.   Cardiovascular: Negative for chest pain, orthopnea and PND.  Gastrointestinal: Positive for nausea. Negative for abdominal pain, diarrhea and vomiting.  Genitourinary: Negative for dysuria and hematuria.  Neurological: Negative for dizziness, sensory change and focal weakness.  All other systems reviewed and are negative.   Nutrition: Heart Healthy Tolerating Diet: Yes Tolerating PT: Await Eval.    DRUG ALLERGIES:   Allergies  Allergen Reactions  . Amoxicillin Other (See Comments)    DID THE REACTION INVOLVE: Swelling of the face/tongue/throat, SOB, or low BP? Unknown Sudden or severe rash/hives, skin peeling, or the inside of the mouth or nose? Unknown Did it require medical treatment? Unknown When did it last happen?Unknown If all above answers are "NO", may proceed with cephalosporin use.   . Codeine Nausea And Vomiting  . Rapaflo [Silodosin] Other (See Comments)    Reaction:  Unknown   . Tramadol Nausea And Vomiting and Other (See Comments)    Pt states that this medication makes him feel crazy.    . Trimethoprim Other (See Comments)    Reaction: unknown    VITALS:  Blood pressure 115/67, pulse 83, temperature 98.5 F (36.9 C), temperature source Oral, resp. rate 18, height 5\' 6"  (3.154 m), weight 70.8 kg, SpO2 96  %.  PHYSICAL EXAMINATION:   Physical Exam  GENERAL:  85 y.o.-year-old patient lying in bed in mild Resp. Distress.  EYES: Pupils equal, round, reactive to light and accommodation. No scleral icterus. Extraocular muscles intact.  HEENT: Head atraumatic, normocephalic. Oropharynx and nasopharynx clear.  NECK:  Supple, no jugular venous distention. No thyroid enlargement, no tenderness.  LUNGS: Good a/e b/l.  Diffuse dry crackles bilaterally, no rhonchi minimal wheezing, negative use of accessory muscles  CARDIOVASCULAR: S1, S2 normal. No murmurs, rubs, or gallops.  ABDOMEN: Soft, nontender, nondistended. Bowel sounds present. No organomegaly or mass.  EXTREMITIES: No cyanosis, clubbing or edema b/l.    NEUROLOGIC: Cranial nerves II through XII are intact. No focal Motor or sensory deficits b/l.   PSYCHIATRIC: The patient is alert and oriented x 3.  SKIN: No obvious rash, lesion, or ulcer.    LABORATORY PANEL:   CBC Recent Labs  Lab 12/20/18 0425  WBC 13.2*  HGB 11.4*  HCT 35.5*  PLT 266   ------------------------------------------------------------------------------------------------------------------  Chemistries  Recent Labs  Lab 12/19/18 0354 12/20/18 0425  NA 141  --   K 4.0  --   CL 107  --   CO2 27  --   GLUCOSE 109*  --   BUN 12  --   CREATININE 1.06  --   CALCIUM 8.8*  --   AST  --  22  ALT  --  14  ALKPHOS  --  69  BILITOT  --  0.6   ------------------------------------------------------------------------------------------------------------------  Cardiac Enzymes No results for input(s): TROPONINI  in the last 168 hours. ------------------------------------------------------------------------------------------------------------------  RADIOLOGY:  Dg Chest 2 View  Result Date: 12/18/2018 CLINICAL DATA:  Cough and dyspnea EXAM: CHEST - 2 VIEW COMPARISON:  08/12/2017 chest radiograph. FINDINGS: Stable cardiomediastinal silhouette with normal heart size.  No pneumothorax. No pleural effusion. Low lung volumes. Extensive patchy hazy and reticular opacities throughout both lungs, most prominent at the lung bases, which appear potentially worsened in the interval. IMPRESSION: Extensive patchy hazy and reticular opacities throughout both lungs, most prominent at the lung bases, which appear potentially worsened in the interval. Findings suggest basilar predominant fibrotic interstitial lung disease, potentially progressive, such as due to usual interstitial pneumonia (UIP). Electronically Signed   By: Ilona Sorrel M.D.   On: 12/18/2018 23:46     ASSESSMENT AND PLAN:   85 year old male with past medical history of interstitial lung disease, COPD, previous history of DVT, BPH, hyperlipidemia presented to the hospital due to shortness of breath, cough, chills.  1.  Acute on chronic respiratory failure with hypoxia-secondary to underlying pulmonary fibrosis complicated with flu. -Patient was positive for influenza B.  Continue Tamiflu, O2 supplementation, continue duo nebs, Pulmicort nebs. - pt. Is improving.  2.  Flu-patient is positive for influenza B. -Continue airborne precautions, continue Tamiflu.    3.  Pulmonary fibrosis/interstitial lung disease-mild acute exacerbation due to flu/superimposed pneumonia. -Continue Tamiflu, IV antibiotics with ceftriaxone, Zithromax.  Continue Pulmicort nebs, duo nebs. - already on O2 at home.  4.  GERD-continue Protonix.  5.  Hyperlipidemia-continue simvastatin.  6.  BPH-continue Flomax, finasteride  7. Nausea - due to post-nasal drip due to flu.  - cont. Zofran PRN.   Await PT eval and pt. Wants to go to the skilled side of [facility] and social work aware.   All the records are reviewed and case discussed with Care Management/Social Worker. Management plans discussed with the patient, family and they are in agreement.  CODE STATUS: Full code  DVT Prophylaxis: Lovenox  TOTAL TIME TAKING CARE OF  THIS PATIENT: 30 minutes.   POSSIBLE D/C IN 1-2 DAYS, DEPENDING ON CLINICAL CONDITION.   Henreitta Leber M.D on 12/20/2018 at 1:52 PM  Between 7am to 6pm - Pager - [phone]  After 6pm go to [email or web address] - Proofreader  [facility]  Office  [phone]  CC: Primary care physician; Baxter Hire, MD

## 2018-12-21 DIAGNOSIS — E440 Moderate protein-calorie malnutrition: Secondary | ICD-10-CM

## 2018-12-21 LAB — CBC
HCT: 36.7 % — ABNORMAL LOW (ref 39.0–52.0)
Hemoglobin: 11.8 g/dL — ABNORMAL LOW (ref 13.0–17.0)
MCH: 27.6 pg (ref 26.0–34.0)
MCHC: 32.2 g/dL (ref 30.0–36.0)
MCV: 85.7 fL (ref 80.0–100.0)
Platelets: 286 10*3/uL (ref 150–400)
RBC: 4.28 MIL/uL (ref 4.22–5.81)
RDW: 14.6 % (ref 11.5–15.5)
WBC: 9.7 10*3/uL (ref 4.0–10.5)
nRBC: 0 % (ref 0.0–0.2)

## 2018-12-21 LAB — BASIC METABOLIC PANEL
Anion gap: 7 (ref 5–15)
BUN: 10 mg/dL (ref 8–23)
CO2: 24 mmol/L (ref 22–32)
CREATININE: 1.03 mg/dL (ref 0.61–1.24)
Calcium: 8.7 mg/dL — ABNORMAL LOW (ref 8.9–10.3)
Chloride: 107 mmol/L (ref 98–111)
GFR calc Af Amer: >60 mL/min (ref >60)
GFR calc non Af Amer: >60 mL/min (ref >60)
Glucose, Bld: 109 mg/dL — ABNORMAL HIGH (ref 70–99)
Potassium: 3.7 mmol/L (ref 3.5–5.1)
Sodium: 138 mmol/L (ref 135–145)

## 2018-12-21 MED ORDER — TAMSULOSIN HCL 0.4 MG PO CAPS: 0.4000 mg | ORAL_CAPSULE | Freq: Every day | ORAL | 12 refills | Status: DC

## 2018-12-21 MED ORDER — DOXYCYCLINE HYCLATE 100 MG PO TABS: 100.0000 mg | ORAL_TABLET | Freq: Two times a day (BID) | ORAL | 0 refills | Status: AC

## 2018-12-21 MED ORDER — BENZONATATE 100 MG PO CAPS: 100.0000 mg | ORAL_CAPSULE | Freq: Three times a day (TID) | ORAL | Status: DC | PRN

## 2018-12-21 MED ORDER — POLYVINYL ALCOHOL 1.4 % OP SOLN
1.0000 [drp] | OPHTHALMIC | Status: DC | PRN
  Filled 2018-12-21: qty 15

## 2018-12-21 MED ORDER — ENSURE ENLIVE PO LIQD: 237.0000 mL | Freq: Two times a day (BID) | ORAL | 12 refills | Status: DC

## 2018-12-21 MED ORDER — NAPHAZOLINE-GLYCERIN 0.012-0.2 % OP SOLN: 1.0000 [drp] | Freq: Four times a day (QID) | OPHTHALMIC | 0 refills | Status: AC | PRN

## 2018-12-21 MED ORDER — OSELTAMIVIR PHOSPHATE 30 MG PO CAPS: 30.0000 mg | ORAL_CAPSULE | Freq: Two times a day (BID) | ORAL | 0 refills | Status: AC

## 2018-12-21 MED ORDER — NAPHAZOLINE-GLYCERIN 0.012-0.2 % OP SOLN
1.0000 [drp] | Freq: Four times a day (QID) | OPHTHALMIC | Status: DC | PRN
  Filled 2018-12-21: qty 15

## 2018-12-21 NOTE — Plan of Care (Signed)
  Problem: Spiritual Needs Goal: Ability to function at adequate level Outcome: Progressing   Problem: Activity: Goal: Ability to tolerate increased activity will improve Outcome: Progressing   Problem: Clinical Measurements: Goal: Ability to maintain a body temperature in the normal range will improve Outcome: Progressing   Problem: Respiratory: Goal: Ability to maintain adequate ventilation will improve Outcome: Progressing Goal: Ability to maintain a clear airway will improve Outcome: Progressing   Problem: Education: Goal: Knowledge of General Education information will improve Description Including pain rating scale, medication(s)/side effects and non-pharmacologic comfort measures Outcome: Progressing   Problem: Health Behavior/Discharge Planning: Goal: Ability to manage health-related needs will improve Outcome: Progressing   Problem: Clinical Measurements: Goal: Ability to maintain clinical measurements within normal limits will improve Outcome: Progressing Goal: Will remain free from infection Outcome: Progressing Goal: Diagnostic test results will improve Outcome: Progressing Goal: Respiratory complications will improve Outcome: Progressing Goal: Cardiovascular complication will be avoided Outcome: Progressing   Problem: Activity: Goal: Risk for activity intolerance will decrease Outcome: Progressing   Problem: Nutrition: Goal: Adequate nutrition will be maintained Outcome: Progressing   Problem: Coping: Goal: Level of anxiety will decrease Outcome: Progressing   Problem: Elimination: Goal: Will not experience complications related to bowel motility Outcome: Progressing Goal: Will not experience complications related to urinary retention Outcome: Progressing   Problem: Pain Managment: Goal: General experience of comfort will improve Outcome: Progressing   Problem: Safety: Goal: Ability to remain free from injury will improve Outcome:  Progressing   Problem: Skin Integrity: Goal: Risk for impaired skin integrity will decrease Outcome: Progressing

## 2018-12-21 NOTE — Care Management Important Message (Signed)
Important Message  Patient Details  Name: Wesley Newton MRN: [id number] Date of Birth: May 14, 1927   Medicare Important Message Given:  Yes    Shelbie Ammons, RN 12/21/2018, 1:17 PM

## 2018-12-21 NOTE — Clinical Social Work Note (Signed)
Patient is medically ready for discharge today. Patient is requesting to go to SNF at [facility]. Seth Bake at [facility] is aware and can accept patient today. CSW notified patient that he can go to SNF today. Patient will be transported by [facility]. RN to call report.   [place], [place]

## 2018-12-21 NOTE — Progress Notes (Signed)
Pt left at this time  For [facility]  Via w/c w/o c/o.  Sl d/cd.  Report called to wanda at [facility] .

## 2018-12-21 NOTE — Discharge Summary (Signed)
[facility] [place] NAME: Wesley Newton    MR#:  [id number]  DATE OF BIRTH:  [birth year]  DATE OF ADMISSION:  12/18/2018 ADMITTING PHYSICIAN: Lance Coon, MD  DATE OF DISCHARGE: No discharge date for patient encounter.  PRIMARY CARE PHYSICIAN: Baxter Hire, MD    ADMISSION DIAGNOSIS:  Breathing Problems  DISCHARGE DIAGNOSIS:  Principal Problem:   Influenza B Active Problems:   BPH (benign prostatic hyperplasia)   GERD (gastroesophageal reflux disease)   Hypercholesterolemia   HTN (hypertension)   PAD (peripheral artery disease) (HCC)   CAP (community acquired pneumonia)   Malnutrition of moderate degree   SECONDARY DIAGNOSIS:   Past Medical History:  Diagnosis Date  . Asthma   . BPH (benign prostatic hyperplasia)   . Bronchitis   . Cancer ([place])   . COPD (chronic obstructive pulmonary disease) ([place])   . DVT of leg (deep venous thrombosis) ([place])   . Elevated PSA   . Emphysema lung ([place])   . Emphysema of lung ([place])   . Hyperlipidemia   . Nocturia   . Pulmonary fibrosis ([place])   . Pulmonary fibrosis ([place])   . Skin cancer     HOSPITAL COURSE:   85 year old male with past medical history of interstitial lung disease, COPD, previous history of DVT, BPH, hyperlipidemia presented to the hospital due to shortness of breath, cough, chills.  1.  Acute on chronic respiratory failure with hypoxia- this was secondary to underlying pulmonary fibrosis complicated with flu. -Patient was positive for influenza B.   -Patient was treated with O2 supplementation, duo nebs, Pulmicort nebs and also given Tamiflu.  He has clinically improved.  He will finish his Tamiflu and continue his nebulizers as stated below.  2.  Flu-patient is positive for influenza B. -Continue supportive care, finish Tamiflu course.  He has 3 days left.  3.  Pulmonary fibrosis/interstitial lung disease-mild acute exacerbation due to flu/superimposed  pneumonia. -Patient was treated with scheduled duo nebs, Pulmicort nebs, Tamiflu, empiric IV antibiotics with ceftriaxone, Zithromax. -He has improved.  He is already on oxygen at home which she will continue.  He will finish his Tamiflu course and he is being discharged on 3 more days of oral doxycycline.  He will continue his albuterol nebulizers and Pulmicort nebs as stated below.  4.  GERD- he will continue his Omeprazole.   5.  Hyperlipidemia- he will continue simvastatin.  6.  BPH- he will continue Flomax, finasteride  7. Nausea - due to post-nasal drip due to flu.  - improved and resolved w/ PRN Zofran.   Patient is being discharged to the skilled nursing side of [facility].  He is in agreement with this plan.  DISCHARGE CONDITIONS:   Stable  CONSULTS OBTAINED:    DRUG ALLERGIES:   Allergies  Allergen Reactions  . Amoxicillin Other (See Comments)    DID THE REACTION INVOLVE: Swelling of the face/tongue/throat, SOB, or low BP? Unknown Sudden or severe rash/hives, skin peeling, or the inside of the mouth or nose? Unknown Did it require medical treatment? Unknown When did it last happen?Unknown If all above answers are "NO", may proceed with cephalosporin use.   . Codeine Nausea And Vomiting  . Rapaflo [Silodosin] Other (See Comments)    Reaction:  Unknown   . Tramadol Nausea And Vomiting and Other (See Comments)    Pt states that this medication makes him feel crazy.    . Trimethoprim Other (See Comments)  Reaction: unknown    DISCHARGE MEDICATIONS:   Allergies as of 12/21/2018      Reactions   Amoxicillin Other (See Comments)   DID THE REACTION INVOLVE: Swelling of the face/tongue/throat, SOB, or low BP? Unknown Sudden or severe rash/hives, skin peeling, or the inside of the mouth or nose? Unknown Did it require medical treatment? Unknown When did it last happen?Unknown If all above answers are "NO", may proceed with cephalosporin use.   Codeine Nausea  And Vomiting   Rapaflo [silodosin] Other (See Comments)   Reaction:  Unknown    Tramadol Nausea And Vomiting, Other (See Comments)   Pt states that this medication makes him feel crazy.     Trimethoprim Other (See Comments)   Reaction: unknown      Medication List    TAKE these medications   albuterol 108 (90 Base) MCG/ACT inhaler Commonly known as:  PROVENTIL HFA;VENTOLIN HFA Inhale 2 puffs into the lungs every 6 (six) hours as needed for wheezing or shortness of breath.   albuterol (2.5 MG/3ML) 0.083% nebulizer solution Commonly known as:  PROVENTIL Take 2.5 mg by nebulization every 6 (six) hours as needed for wheezing or shortness of breath.   aspirin EC 81 MG tablet Take 81 mg by mouth daily.   benzonatate 100 MG capsule Commonly known as:  TESSALON Take 1 capsule (100 mg total) by mouth 3 (three) times daily as needed for cough.   budesonide 0.5 MG/2ML nebulizer solution Commonly known as:  PULMICORT Take 0.5 mg by nebulization daily.   doxycycline 100 MG tablet Commonly known as:  VIBRA-TABS Take 1 tablet (100 mg total) by mouth every 12 (twelve) hours for 3 days.   feeding supplement (ENSURE ENLIVE) Liqd Take 237 mLs by mouth 2 (two) times daily between meals.   finasteride 5 MG tablet Commonly known as:  PROSCAR Take 1 tablet (5 mg total) by mouth daily.   gabapentin 100 MG capsule Commonly known as:  NEURONTIN Take 200 mg by mouth 2 (two) times daily.   isosorbide mononitrate 30 MG 24 hr tablet Commonly known as:  IMDUR Take 30 mg by mouth daily.   LORazepam 0.5 MG tablet Commonly known as:  ATIVAN Take 0.5 mg by mouth every 8 (eight) hours as needed.   MAGNESIUM PO Take 1 tablet by mouth daily.   multivitamin with minerals Tabs tablet Take 1 tablet by mouth daily.   naphazoline-glycerin 0.012-0.2 % Soln Commonly known as:  CLEAR EYES REDNESS Place 1-2 drops into both eyes 4 (four) times daily as needed for eye irritation.   naproxen 375 MG  tablet Commonly known as:  NAPROSYN Take 375 mg by mouth 3 (three) times daily with meals.   omeprazole 40 MG capsule Commonly known as:  PRILOSEC Take 40 mg by mouth daily.   oseltamivir 30 MG capsule Commonly known as:  TAMIFLU Take 1 capsule (30 mg total) by mouth 2 (two) times daily for 3 days.   PRESERVISION AREDS 2 PO Take 1 capsule by mouth 2 (two) times daily.   simvastatin 40 MG tablet Commonly known as:  ZOCOR Take 40 mg by mouth at bedtime.   tamsulosin 0.4 MG Caps capsule Commonly known as:  FLOMAX Take 1 capsule (0.4 mg total) by mouth daily.         DISCHARGE INSTRUCTIONS:   DIET:  Cardiac diet  DISCHARGE CONDITION:  Stable  ACTIVITY:  Activity as tolerated  OXYGEN:  Home Oxygen: Yes.     Oxygen Delivery: 2  liters/min via Patient connected to nasal cannula oxygen  DISCHARGE LOCATION:  nursing home   If you experience worsening of your admission symptoms, develop shortness of breath, life threatening emergency, suicidal or homicidal thoughts you must seek medical attention immediately by calling 911 or calling your MD immediately  if symptoms less severe.  You Must read complete instructions/literature along with all the possible adverse reactions/side effects for all the Medicines you take and that have been prescribed to you. Take any new Medicines after you have completely understood and accpet all the possible adverse reactions/side effects.   Please note  You were cared for by a hospitalist during your hospital stay. If you have any questions about your discharge medications or the care you received while you were in the hospital after you are discharged, you can call the unit and asked to speak with the hospitalist on call if the hospitalist that took care of you is not available. Once you are discharged, your primary care physician will handle any further medical issues. Please note that NO REFILLS for any discharge medications will be  authorized once you are discharged, as it is imperative that you return to your primary care physician (or establish a relationship with a primary care physician if you do not have one) for your aftercare needs so that they can reassess your need for medications and monitor your lab values.     Today   No acute events overnight, still has a cough but is improved.  Shortness of breath, wheezing, rattling has improved since admission.    VITAL SIGNS:  Blood pressure (!) 156/74, pulse 88, temperature 97.8 F (36.6 C), temperature source Oral, resp. rate 18, height 5\' 6"  (1.676 m), weight 70.8 kg, SpO2 93 %.  I/O:    Intake/Output Summary (Last 24 hours) at 12/21/2018 1354 Last data filed at 12/20/2018 1439 Gross per 24 hour  Intake 250 ml  Output -  Net 250 ml    PHYSICAL EXAMINATION:   GENERAL:  85 y.o.-year-old patient lying in bed in mild Resp. Distress.  EYES: Pupils equal, round, reactive to light and accommodation. No scleral icterus. Extraocular muscles intact.  HEENT: Head atraumatic, normocephalic. Oropharynx and nasopharynx clear.  NECK:  Supple, no jugular venous distention. No thyroid enlargement, no tenderness.  LUNGS: Good a/e b/l.  Diffuse dry crackles bilaterally, minimal rhonchi, no wheezing b/l, negative use of accessory muscles  CARDIOVASCULAR: S1, S2 normal. No murmurs, rubs, or gallops.  ABDOMEN: Soft, nontender, nondistended. Bowel sounds present. No organomegaly or mass.  EXTREMITIES: No cyanosis, clubbing or edema b/l.    NEUROLOGIC: Cranial nerves II through XII are intact. No focal Motor or sensory deficits b/l.   PSYCHIATRIC: The patient is alert and oriented x 3.  SKIN: No obvious rash, lesion, or ulcer.   DATA REVIEW:   CBC Recent Labs  Lab 12/21/18 0718  WBC 9.7  HGB 11.8*  HCT 36.7*  PLT 286    Chemistries  Recent Labs  Lab 12/20/18 0425 12/21/18 0718  NA  --  138  K  --  3.7  CL  --  107  CO2  --  24  GLUCOSE  --  109*  BUN  --  10   CREATININE  --  1.03  CALCIUM  --  8.7*  AST 22  --   ALT 14  --   ALKPHOS 69  --   BILITOT 0.6  --     Cardiac Enzymes No results for input(s): TROPONINI  in the last 168 hours.  Microbiology Results  No results found for this or any previous visit.  RADIOLOGY:  No results found.    Management plans discussed with the patient, family and they are in agreement.  CODE STATUS:     Code Status Orders  (From admission, onward)         Start     Ordered   12/19/18 0235  Full code  Continuous     12/19/18 0234        Code Status History    This patient has a current code status but no historical code status.      TOTAL TIME TAKING CARE OF THIS PATIENT: 45 minutes.    Henreitta Leber M.D on 12/21/2018 at 1:54 PM  Between 7am to 6pm - Pager - [phone]  After 6pm go to [email or web address] - Proofreader  [facility]  Office  [phone]  CC: Primary care physician; Baxter Hire, MD

## 2019-01-30 ENCOUNTER — Other Ambulatory Visit: Payer: Self-pay | Admitting: Specialist

## 2019-01-30 DIAGNOSIS — J849 Interstitial pulmonary disease, unspecified: Secondary | ICD-10-CM

## 2019-02-05 ENCOUNTER — Ambulatory Visit
Admission: RE | Admit: 2019-02-05 | Discharge: 2019-02-05 | Disposition: A | Discharge status: Home / Self Care | Payer: Medicare Other | Source: Ambulatory Visit | Attending: Specialist | Admitting: Specialist

## 2019-02-05 DIAGNOSIS — J849 Interstitial pulmonary disease, unspecified: Secondary | ICD-10-CM | POA: Insufficient documentation

## 2019-02-06 ENCOUNTER — Ambulatory Visit: Payer: Medicare Other | Admitting: Urology

## 2019-03-02 ENCOUNTER — Emergency Department: Payer: Medicare Other

## 2019-03-02 ENCOUNTER — Other Ambulatory Visit: Payer: Self-pay

## 2019-03-02 ENCOUNTER — Emergency Department
Admission: EM | Admit: 2019-03-02 | Discharge: 2019-03-02 | Disposition: A | Discharge status: Home / Self Care | Payer: Medicare Other | Attending: Emergency Medicine | Admitting: Emergency Medicine

## 2019-03-02 DIAGNOSIS — I10 Essential (primary) hypertension: Secondary | ICD-10-CM | POA: Diagnosis not present

## 2019-03-02 DIAGNOSIS — Z7982 Long term (current) use of aspirin: Secondary | ICD-10-CM | POA: Diagnosis not present

## 2019-03-02 DIAGNOSIS — J439 Emphysema, unspecified: Secondary | ICD-10-CM | POA: Diagnosis not present

## 2019-03-02 DIAGNOSIS — J069 Acute upper respiratory infection, unspecified: Secondary | ICD-10-CM | POA: Insufficient documentation

## 2019-03-02 DIAGNOSIS — J449 Chronic obstructive pulmonary disease, unspecified: Secondary | ICD-10-CM | POA: Insufficient documentation

## 2019-03-02 DIAGNOSIS — Z859 Personal history of malignant neoplasm, unspecified: Secondary | ICD-10-CM | POA: Diagnosis not present

## 2019-03-02 DIAGNOSIS — R059 Cough, unspecified: Secondary | ICD-10-CM

## 2019-03-02 DIAGNOSIS — Z87891 Personal history of nicotine dependence: Secondary | ICD-10-CM | POA: Diagnosis not present

## 2019-03-02 DIAGNOSIS — Z79899 Other long term (current) drug therapy: Secondary | ICD-10-CM | POA: Diagnosis not present

## 2019-03-02 DIAGNOSIS — R05 Cough: Secondary | ICD-10-CM

## 2019-03-02 LAB — CBC
HCT: 37.6 % — ABNORMAL LOW (ref 39.0–52.0)
HEMOGLOBIN: 12.1 g/dL — AB (ref 13.0–17.0)
MCH: 27.6 pg (ref 26.0–34.0)
MCHC: 32.2 g/dL (ref 30.0–36.0)
MCV: 85.8 fL (ref 80.0–100.0)
Platelets: 290 10*3/uL (ref 150–400)
RBC: 4.38 MIL/uL (ref 4.22–5.81)
RDW: 15.3 % (ref 11.5–15.5)
WBC: 9.3 10*3/uL (ref 4.0–10.5)
nRBC: 0 % (ref 0.0–0.2)

## 2019-03-02 LAB — COMPREHENSIVE METABOLIC PANEL
ALBUMIN: 3.4 g/dL — AB (ref 3.5–5.0)
ALT: 16 U/L (ref 0–44)
ANION GAP: 9 (ref 5–15)
AST: 25 U/L (ref 15–41)
Alkaline Phosphatase: 69 U/L (ref 38–126)
BUN: 18 mg/dL (ref 8–23)
CO2: 25 mmol/L (ref 22–32)
Calcium: 9.1 mg/dL (ref 8.9–10.3)
Chloride: 104 mmol/L (ref 98–111)
Creatinine, Ser: 1.15 mg/dL (ref 0.61–1.24)
GFR calc Af Amer: >60 mL/min (ref >60)
GFR calc non Af Amer: 56 mL/min — ABNORMAL LOW (ref >60)
Glucose, Bld: 102 mg/dL — ABNORMAL HIGH (ref 70–99)
Potassium: 3.9 mmol/L (ref 3.5–5.1)
SODIUM: 138 mmol/L (ref 135–145)
Total Bilirubin: 0.8 mg/dL (ref 0.3–1.2)
Total Protein: 7.2 g/dL (ref 6.5–8.1)

## 2019-03-02 LAB — TROPONIN I: Troponin I: <0.03 ng/mL (ref <0.03)

## 2019-03-02 MED ORDER — PREDNISONE 20 MG PO TABS: 40.0000 mg | ORAL_TABLET | Freq: Every day | ORAL | 0 refills | Status: DC

## 2019-03-02 MED ORDER — METHYLPREDNISOLONE SODIUM SUCC 125 MG IJ SOLR
62.5000 mg | Freq: Once | INTRAMUSCULAR | Status: AC
  Administered 2019-03-02: 62.5 mg via INTRAVENOUS
  Filled 2019-03-02: qty 2

## 2019-03-02 MED ORDER — IPRATROPIUM-ALBUTEROL 0.5-2.5 (3) MG/3ML IN SOLN
3.0000 mL | Freq: Once | RESPIRATORY_TRACT | Status: AC
  Administered 2019-03-02: 3 mL via RESPIRATORY_TRACT
  Filled 2019-03-02: qty 3

## 2019-03-02 MED ORDER — AZITHROMYCIN 250 MG PO TABS: ORAL_TABLET | ORAL | 0 refills | Status: AC

## 2019-03-02 NOTE — ED Notes (Signed)
Pt with sob, cough worsening this morning. Pt has hx of pulmonary fibrosis and states he has been coughing "for five years." C/O ribcage pain yesterday, resolved at this time. Pt alert & oriented, states he feels somewhat better now. NAD noted; daughter at bedside.

## 2019-03-02 NOTE — ED Notes (Signed)
Pt ambulated to restroom; O2 sat dropped to 92%, then went back up to 95% upon getting back in bed. NAD noted.

## 2019-03-02 NOTE — ED Triage Notes (Signed)
Pt arrives via ems from independent living at [facility], pt reports a coughing spell that started at 0230, states that he had sob after that, pt has a hx of emphysema and pulmonary fibrosis, pt also states that he was diagnosed with the flu in January, states that his cough has been productive and states rt sided lower rib pain yesterday. No distress noted at this time. Ems states that staff at [facility] had ambulated him and noted that his sats were in the 70's.

## 2019-03-02 NOTE — ED Provider Notes (Signed)
[facility] Emergency Department Provider Note  Time seen: 8:24 AM  I have reviewed the triage vital signs and the nursing notes.   HISTORY  Chief Complaint Cough and Shortness of Breath   HPI Wesley Newton is a 85 y.o. male with a past medical history of BPH, COPD, hyperlipidemia, pulmonary fibrosis, presents to the emergency department for cough and shortness of breath.  According to the patient he has a history of pulmonary fibrosis, emphysema, states he coughs all the time but over the past 3 to 4 days his cough has been worse now with sputum production.  Patient awoke around 3:00 this morning feeling very short of breath and coughing.  Patient lives at [facility], hit his medical alert button and was evaluated by their staff.  Patient ultimately felt better and went back to bed.  Again this morning he woke feeling short of breath once again called staff who checked him with an initial pulse ox in the 70s after ambulation with no home O2 requirement.  EMS was called to bring the patient to the emergency department.  Currently the patient appears well, continues to have an occasional cough during exam.  Denies any significant shortness of breath while lying in bed resting.  Denies any chest pain or abdominal pain at any point.  Largely negative review of systems otherwise including negative for fever although the patient states he did awake with chills last night.   Past Medical History:  Diagnosis Date  . Asthma   . BPH (benign prostatic hyperplasia)   . Bronchitis   . Cancer ([place])   . COPD (chronic obstructive pulmonary disease) ([place])   . DVT of leg (deep venous thrombosis) ([place])   . Elevated PSA   . Emphysema lung ([place])   . Emphysema of lung ([place])   . Hyperlipidemia   . Nocturia   . Pulmonary fibrosis ([place])   . Pulmonary fibrosis ([place])   . Skin cancer     Patient Active Problem List   Diagnosis Date Noted  . Malnutrition of moderate degree 12/21/2018   . Influenza B 12/19/2018  . CAP (community acquired pneumonia) 12/19/2018  . PAD (peripheral artery disease) ([place]) 04/25/2017  . BPH (benign prostatic hyperplasia) 01/10/2016  . Nocturia 01/10/2016  . Benign prostatic hyperplasia with urinary obstruction 01/10/2016  . AB (asthmatic bronchitis) 12/17/2014  . Carotid artery disease ([place]) 12/04/2014  . GERD (gastroesophageal reflux disease) 12/04/2014  . H/O respiratory system disease 12/04/2014  . H/O peptic ulcer 12/04/2014  . Hypercholesterolemia 12/04/2014  . Unstable angina pectoris ([place]) 12/04/2014  . Amnesia 12/04/2014  . Chronic obstructive pulmonary disease ([place]) 06/11/2014  . Cephalalgia 06/11/2014  . HTN (hypertension) 06/11/2014    Past Surgical History:  Procedure Laterality Date  . CATARACT EXTRACTION    . HERNIA REPAIR    . TONSILLECTOMY      Prior to Admission medications   Medication Sig Start Date End Date Taking? Authorizing Provider  albuterol (PROVENTIL HFA;VENTOLIN HFA) 108 (90 Base) MCG/ACT inhaler Inhale 2 puffs into the lungs every 6 (six) hours as needed for wheezing or shortness of breath.    [provider]  albuterol (PROVENTIL) (2.5 MG/3ML) 0.083% nebulizer solution Take 2.5 mg by nebulization every 6 (six) hours as needed for wheezing or shortness of breath.    [provider]  aspirin EC 81 MG tablet Take 81 mg by mouth daily.     [provider]  benzonatate (TESSALON) 100 MG capsule Take  1 capsule (100 mg total) by mouth 3 (three) times daily as needed for cough. 12/21/18   Henreitta Leber, MD  budesonide (PULMICORT) 0.5 MG/2ML nebulizer solution Take 0.5 mg by nebulization daily.    [provider]  feeding supplement, ENSURE ENLIVE, (ENSURE ENLIVE) LIQD Take 237 mLs by mouth 2 (two) times daily between meals. 12/21/18   Henreitta Leber, MD  finasteride (PROSCAR) 5 MG tablet Take 1 tablet (5 mg total) by mouth daily. 02/07/18   Zara Council A, PA-C  gabapentin  (NEURONTIN) 100 MG capsule Take 200 mg by mouth 2 (two) times daily. 03/27/17   [provider]  isosorbide mononitrate (IMDUR) 30 MG 24 hr tablet Take 30 mg by mouth daily.    [provider]  LORazepam (ATIVAN) 0.5 MG tablet Take 0.5 mg by mouth every 8 (eight) hours as needed.  03/28/17   [provider]  MAGNESIUM PO Take 1 tablet by mouth daily.     [provider]  Multiple Vitamin (MULTIVITAMIN WITH MINERALS) TABS tablet Take 1 tablet by mouth daily.    [provider]  Multiple Vitamins-Minerals (PRESERVISION AREDS 2 PO) Take 1 capsule by mouth 2 (two) times daily.    [provider]  naphazoline-glycerin (CLEAR EYES REDNESS) 0.012-0.2 % SOLN Place 1-2 drops into both eyes 4 (four) times daily as needed for eye irritation. 12/21/18   Henreitta Leber, MD  naproxen (NAPROSYN) 375 MG tablet Take 375 mg by mouth 3 (three) times daily with meals.     [provider]  omeprazole (PRILOSEC) 40 MG capsule Take 40 mg by mouth daily.     [provider]  simvastatin (ZOCOR) 40 MG tablet Take 40 mg by mouth at bedtime.     [provider]  tamsulosin (FLOMAX) 0.4 MG CAPS capsule Take 1 capsule (0.4 mg total) by mouth daily. 12/21/18   Henreitta Leber, MD    Allergies  Allergen Reactions  . Amoxicillin Other (See Comments)    DID THE REACTION INVOLVE: Swelling of the face/tongue/throat, SOB, or low BP? Unknown Sudden or severe rash/hives, skin peeling, or the inside of the mouth or nose? Unknown Did it require medical treatment? Unknown When did it last happen?Unknown If all above answers are "NO", may proceed with cephalosporin use.   . Codeine Nausea And Vomiting  . Rapaflo [Silodosin] Other (See Comments)    Reaction:  Unknown   . Tramadol Nausea And Vomiting and Other (See Comments)    Pt states that this medication makes him feel crazy.    . Trimethoprim Other (See Comments)    Reaction: unknown    Family  History  Problem Relation Age of Onset  . Heart Problems Brother   . Kidney disease Neg Hx   . Prostate cancer Neg Hx     Social History Social History   Tobacco Use  . Smoking status: Former Research scientist (life sciences)  . Smokeless tobacco: Never Used  . Tobacco comment: quit 40 years  Substance Use Topics  . Alcohol use: No    Alcohol/week: 0.0 standard drinks  . Drug use: No    Review of Systems Constitutional: Negative for fever.  Positive for chills. Cardiovascular: Negative for chest pain. Respiratory: Negative for shortness of breath.  Positive for cough. Gastrointestinal: Negative for abdominal pain, vomiting Musculoskeletal: Negative for musculoskeletal complaints Skin: Negative for skin complaints  Neurological: Negative for headache All other ROS negative  ____________________________________________   PHYSICAL EXAM:  VITAL SIGNS: ED Triage  Vitals  Enc Vitals Group     BP 03/02/19 0755 122/79     Pulse Rate 03/02/19 0755 86     Resp --      Temp 03/02/19 0755 98.1 F (36.7 C)     Temp Source 03/02/19 0755 Oral     SpO2 03/02/19 0755 95 %     Weight 03/02/19 0756 152 lb (68.9 kg)     Height 03/02/19 0756 5\' 6"  (1.676 m)     Head Circumference --      Peak Flow --      Pain Score 03/02/19 0756 0     Pain Loc --      Pain Edu? --      Excl. in [place]? --     Constitutional: Alert and oriented. Well appearing and in no distress. Eyes: Normal exam ENT   Head: Normocephalic and atraumatic.   Nose: No congestion/rhinnorhea.   Mouth/Throat: Mucous membranes are moist. Cardiovascular: Normal rate, regular rhythm. No murmurs, rubs, or gallops. Respiratory: Normal respiratory effort without tachypnea nor retractions.  Very minimal expiratory wheeze.  Occasional cough during exam. Gastrointestinal: Soft and nontender. No distention.  Musculoskeletal: Nontender with normal range of motion in all extremities. No lower extremity tenderness or edema. Neurologic:  Normal  speech and language. No gross focal neurologic deficits are appreciated. Skin:  Skin is warm, dry and intact.  Psychiatric: Mood and affect are normal. Speech and behavior are normal.   ____________________________________________    EKG  EKG viewed and interpreted by myself shows a sinus rhythm 87 bpm with a narrow QRS, normal axis, normal intervals, no concerning ST changes.  ____________________________________________    RADIOLOGY  Chest x-ray shows chronic disease but no acute abnormality  ____________________________________________   INITIAL IMPRESSION / ASSESSMENT AND PLAN / ED COURSE  Pertinent labs & imaging results that were available during my care of the patient were reviewed by me and considered in my medical decision making (see chart for details).  Patient presents to the emergency department with cough, congestion and shortness of breath found to have a low room air saturation during ambulation and was sent to the ER for evaluation.  Differential at this time would include COPD exacerbation, pulmonary fibrosis exacerbation, upper respiratory infection, pneumonia or influenza.  We will check labs, chest x-ray, treat with Solu-Medrol and duo nebs and continue to closely monitor.  Patient agreeable to plan of care.  Patient is feeling much better after breathing treatments.  Labs are largely within normal limits including a negative troponin.  Chest x-ray shows no acute finding.  Patient satting in the upper 90s on room air, has ambulated maintains low to mid 90s sats ambulating.  Given his history of emphysema and pulmonary fibrosis I do believe it would be prudent to cover the patient with antibiotics and steroids as a precaution and have the patient follow-up with his physician.  I also discussed return precautions for fever, return of shortness of breath or chest pain.  Patient agreeable to plan of care.  ____________________________________________   FINAL CLINICAL  IMPRESSION(S) / ED DIAGNOSES  Emphysema Dyspnea   Harvest Dark, MD 03/02/19 1051

## 2019-03-02 NOTE — ED Notes (Signed)
Pt given ice water; ok per Dr. Kerman Passey.

## 2019-03-03 ENCOUNTER — Emergency Department: Payer: Medicare Other

## 2019-03-03 ENCOUNTER — Encounter: Payer: Self-pay | Admitting: Emergency Medicine

## 2019-03-03 ENCOUNTER — Observation Stay
Admission: EM | Admit: 2019-03-03 | Discharge: 2019-03-05 | Disposition: A | Discharge status: Home / Self Care | Payer: Medicare Other | Attending: Internal Medicine | Admitting: Internal Medicine

## 2019-03-03 ENCOUNTER — Other Ambulatory Visit: Payer: Self-pay

## 2019-03-03 DIAGNOSIS — E785 Hyperlipidemia, unspecified: Secondary | ICD-10-CM | POA: Insufficient documentation

## 2019-03-03 DIAGNOSIS — Z7982 Long term (current) use of aspirin: Secondary | ICD-10-CM | POA: Diagnosis not present

## 2019-03-03 DIAGNOSIS — E869 Volume depletion, unspecified: Secondary | ICD-10-CM | POA: Insufficient documentation

## 2019-03-03 DIAGNOSIS — T8182XA Emphysema (subcutaneous) resulting from a procedure, initial encounter: Secondary | ICD-10-CM | POA: Diagnosis present

## 2019-03-03 DIAGNOSIS — Y839 Surgical procedure, unspecified as the cause of abnormal reaction of the patient, or of later complication, without mention of misadventure at the time of the procedure: Secondary | ICD-10-CM | POA: Diagnosis not present

## 2019-03-03 DIAGNOSIS — M545 Low back pain: Secondary | ICD-10-CM | POA: Insufficient documentation

## 2019-03-03 DIAGNOSIS — Z66 Do not resuscitate: Secondary | ICD-10-CM | POA: Insufficient documentation

## 2019-03-03 DIAGNOSIS — Z79899 Other long term (current) drug therapy: Secondary | ICD-10-CM | POA: Diagnosis not present

## 2019-03-03 DIAGNOSIS — E878 Other disorders of electrolyte and fluid balance, not elsewhere classified: Secondary | ICD-10-CM | POA: Diagnosis not present

## 2019-03-03 DIAGNOSIS — Z885 Allergy status to narcotic agent status: Secondary | ICD-10-CM | POA: Insufficient documentation

## 2019-03-03 DIAGNOSIS — J449 Chronic obstructive pulmonary disease, unspecified: Secondary | ICD-10-CM | POA: Diagnosis present

## 2019-03-03 DIAGNOSIS — Z8249 Family history of ischemic heart disease and other diseases of the circulatory system: Secondary | ICD-10-CM | POA: Diagnosis not present

## 2019-03-03 DIAGNOSIS — J84112 Idiopathic pulmonary fibrosis: Secondary | ICD-10-CM

## 2019-03-03 DIAGNOSIS — I5032 Chronic diastolic (congestive) heart failure: Secondary | ICD-10-CM | POA: Insufficient documentation

## 2019-03-03 DIAGNOSIS — Z87891 Personal history of nicotine dependence: Secondary | ICD-10-CM | POA: Insufficient documentation

## 2019-03-03 DIAGNOSIS — G8929 Other chronic pain: Secondary | ICD-10-CM | POA: Insufficient documentation

## 2019-03-03 DIAGNOSIS — Z86718 Personal history of other venous thrombosis and embolism: Secondary | ICD-10-CM | POA: Insufficient documentation

## 2019-03-03 DIAGNOSIS — Z7951 Long term (current) use of inhaled steroids: Secondary | ICD-10-CM | POA: Diagnosis not present

## 2019-03-03 DIAGNOSIS — D649 Anemia, unspecified: Secondary | ICD-10-CM | POA: Insufficient documentation

## 2019-03-03 DIAGNOSIS — R740 Nonspecific elevation of levels of transaminase and lactic acid dehydrogenase [LDH]: Secondary | ICD-10-CM | POA: Insufficient documentation

## 2019-03-03 DIAGNOSIS — Z7952 Long term (current) use of systemic steroids: Secondary | ICD-10-CM | POA: Diagnosis not present

## 2019-03-03 DIAGNOSIS — Z791 Long term (current) use of non-steroidal anti-inflammatories (NSAID): Secondary | ICD-10-CM | POA: Diagnosis not present

## 2019-03-03 DIAGNOSIS — Z881 Allergy status to other antibiotic agents status: Secondary | ICD-10-CM | POA: Insufficient documentation

## 2019-03-03 DIAGNOSIS — J441 Chronic obstructive pulmonary disease with (acute) exacerbation: Principal | ICD-10-CM | POA: Diagnosis present

## 2019-03-03 DIAGNOSIS — Z85828 Personal history of other malignant neoplasm of skin: Secondary | ICD-10-CM | POA: Diagnosis not present

## 2019-03-03 DIAGNOSIS — G4733 Obstructive sleep apnea (adult) (pediatric): Secondary | ICD-10-CM | POA: Diagnosis not present

## 2019-03-03 DIAGNOSIS — Z888 Allergy status to other drugs, medicaments and biological substances status: Secondary | ICD-10-CM | POA: Insufficient documentation

## 2019-03-03 DIAGNOSIS — Z880 Allergy status to penicillin: Secondary | ICD-10-CM | POA: Insufficient documentation

## 2019-03-03 LAB — COMPREHENSIVE METABOLIC PANEL
ALT: 23 U/L (ref 0–44)
AST: 46 U/L — ABNORMAL HIGH (ref 15–41)
Albumin: 4.1 g/dL (ref 3.5–5.0)
Alkaline Phosphatase: 72 U/L (ref 38–126)
Anion gap: 8 (ref 5–15)
BUN: 21 mg/dL (ref 8–23)
CO2: 26 mmol/L (ref 22–32)
Calcium: 9.1 mg/dL (ref 8.9–10.3)
Chloride: 101 mmol/L (ref 98–111)
Creatinine, Ser: 1.23 mg/dL (ref 0.61–1.24)
GFR, EST AFRICAN AMERICAN: 60 mL/min — AB (ref >60)
GFR, EST NON AFRICAN AMERICAN: 51 mL/min — AB (ref >60)
Glucose, Bld: 131 mg/dL — ABNORMAL HIGH (ref 70–99)
Potassium: 4.2 mmol/L (ref 3.5–5.1)
Sodium: 135 mmol/L (ref 135–145)
Total Bilirubin: 0.4 mg/dL (ref 0.3–1.2)
Total Protein: 7.6 g/dL (ref 6.5–8.1)

## 2019-03-03 LAB — CBC WITH DIFFERENTIAL/PLATELET
Abs Immature Granulocytes: 0.06 10*3/uL (ref 0.00–0.07)
Basophils Absolute: 0 10*3/uL (ref 0.0–0.1)
Basophils Relative: 0 %
EOS ABS: 0 10*3/uL (ref 0.0–0.5)
Eosinophils Relative: 0 %
HCT: 39.3 % (ref 39.0–52.0)
Hemoglobin: 12.8 g/dL — ABNORMAL LOW (ref 13.0–17.0)
Immature Granulocytes: 1 %
Lymphocytes Relative: 16 %
Lymphs Abs: 1.9 10*3/uL (ref 0.7–4.0)
MCH: 27.4 pg (ref 26.0–34.0)
MCHC: 32.6 g/dL (ref 30.0–36.0)
MCV: 84.2 fL (ref 80.0–100.0)
Monocytes Absolute: 0.3 10*3/uL (ref 0.1–1.0)
Monocytes Relative: 2 %
Neutro Abs: 9.7 10*3/uL — ABNORMAL HIGH (ref 1.7–7.7)
Neutrophils Relative %: 81 %
Platelets: 338 10*3/uL (ref 150–400)
RBC: 4.67 MIL/uL (ref 4.22–5.81)
RDW: 15.2 % (ref 11.5–15.5)
WBC: 12 10*3/uL — ABNORMAL HIGH (ref 4.0–10.5)
nRBC: 0 % (ref 0.0–0.2)

## 2019-03-03 LAB — MAGNESIUM: Magnesium: 2.5 mg/dL — ABNORMAL HIGH (ref 1.7–2.4)

## 2019-03-03 LAB — INFLUENZA PANEL BY PCR (TYPE A & B)
Influenza A By PCR: NEGATIVE
Influenza B By PCR: NEGATIVE

## 2019-03-03 LAB — PHOSPHORUS: Phosphorus: 2.2 mg/dL — ABNORMAL LOW (ref 2.5–4.6)

## 2019-03-03 MED ORDER — BENZONATATE 100 MG PO CAPS: 100.0000 mg | ORAL_CAPSULE | Freq: Three times a day (TID) | ORAL | Status: DC | PRN

## 2019-03-03 MED ORDER — ONDANSETRON HCL 4 MG PO TABS: 4.0000 mg | ORAL_TABLET | Freq: Four times a day (QID) | ORAL | Status: DC | PRN

## 2019-03-03 MED ORDER — ENSURE ENLIVE PO LIQD
237.0000 mL | Freq: Two times a day (BID) | ORAL | Status: DC
  Administered 2019-03-04 – 2019-03-05 (×3): 237 mL via ORAL

## 2019-03-03 MED ORDER — SENNOSIDES-DOCUSATE SODIUM 8.6-50 MG PO TABS: 1.0000 | ORAL_TABLET | Freq: Every evening | ORAL | Status: DC | PRN

## 2019-03-03 MED ORDER — IPRATROPIUM-ALBUTEROL 0.5-2.5 (3) MG/3ML IN SOLN
3.0000 mL | Freq: Once | RESPIRATORY_TRACT | Status: AC
  Administered 2019-03-03: 3 mL via RESPIRATORY_TRACT
  Filled 2019-03-03: qty 3

## 2019-03-03 MED ORDER — METHYLPREDNISOLONE SODIUM SUCC 125 MG IJ SOLR
80.0000 mg | Freq: Once | INTRAMUSCULAR | Status: AC
  Administered 2019-03-03: 80 mg via INTRAVENOUS
  Filled 2019-03-03: qty 2

## 2019-03-03 MED ORDER — SODIUM CHLORIDE 0.9% FLUSH
10.0000 mL | Freq: Two times a day (BID) | INTRAVENOUS | Status: DC
  Administered 2019-03-03 – 2019-03-05 (×4): 10 mL via INTRAVENOUS

## 2019-03-03 MED ORDER — POTASSIUM PHOSPHATES 15 MMOLE/5ML IV SOLN
10.0000 mmol | Freq: Once | INTRAVENOUS | Status: AC
  Administered 2019-03-04: 10 mmol via INTRAVENOUS
  Filled 2019-03-03: qty 3.33

## 2019-03-03 MED ORDER — LORAZEPAM 0.5 MG PO TABS
0.5000 mg | ORAL_TABLET | Freq: Three times a day (TID) | ORAL | Status: DC | PRN
  Administered 2019-03-03 – 2019-03-04 (×2): 0.5 mg via ORAL
  Filled 2019-03-03 (×2): qty 1

## 2019-03-03 MED ORDER — ISOSORBIDE MONONITRATE ER 30 MG PO TB24
30.0000 mg | ORAL_TABLET | Freq: Every day | ORAL | Status: DC
  Administered 2019-03-04: 30 mg via ORAL
  Filled 2019-03-03: qty 1

## 2019-03-03 MED ORDER — ONDANSETRON HCL 4 MG/2ML IJ SOLN: 4.0000 mg | Freq: Four times a day (QID) | INTRAMUSCULAR | Status: DC | PRN

## 2019-03-03 MED ORDER — LACTATED RINGERS IV SOLN
INTRAVENOUS | Status: AC
  Administered 2019-03-04: via INTRAVENOUS

## 2019-03-03 MED ORDER — PREDNISONE 20 MG PO TABS
40.0000 mg | ORAL_TABLET | Freq: Every day | ORAL | Status: DC
  Administered 2019-03-05: 40 mg via ORAL
  Filled 2019-03-03: qty 2

## 2019-03-03 MED ORDER — PANTOPRAZOLE SODIUM 40 MG PO TBEC
40.0000 mg | DELAYED_RELEASE_TABLET | Freq: Every day | ORAL | Status: DC
  Administered 2019-03-04 – 2019-03-05 (×2): 40 mg via ORAL
  Filled 2019-03-03 (×2): qty 1

## 2019-03-03 MED ORDER — ACETAMINOPHEN 325 MG PO TABS: 650.0000 mg | ORAL_TABLET | Freq: Four times a day (QID) | ORAL | Status: DC | PRN

## 2019-03-03 MED ORDER — SODIUM CHLORIDE 0.9 % IV BOLUS
250.0000 mL | Freq: Once | INTRAVENOUS | Status: AC
  Administered 2019-03-03: 250 mL via INTRAVENOUS

## 2019-03-03 MED ORDER — IPRATROPIUM-ALBUTEROL 0.5-2.5 (3) MG/3ML IN SOLN
3.0000 mL | RESPIRATORY_TRACT | Status: DC
  Administered 2019-03-03 – 2019-03-04 (×3): 3 mL via RESPIRATORY_TRACT
  Filled 2019-03-03 (×3): qty 3

## 2019-03-03 MED ORDER — LIDOCAINE 5 % EX PTCH
1.0000 | MEDICATED_PATCH | CUTANEOUS | Status: DC
  Administered 2019-03-04 (×2): 1 via TRANSDERMAL
  Filled 2019-03-03 (×2): qty 1

## 2019-03-03 MED ORDER — METHYLPREDNISOLONE SODIUM SUCC 125 MG IJ SOLR
60.0000 mg | Freq: Two times a day (BID) | INTRAMUSCULAR | Status: AC
  Administered 2019-03-04 (×2): 60 mg via INTRAVENOUS
  Filled 2019-03-03 (×2): qty 2

## 2019-03-03 MED ORDER — SIMVASTATIN 20 MG PO TABS
40.0000 mg | ORAL_TABLET | Freq: Every day | ORAL | Status: DC
  Administered 2019-03-03 – 2019-03-04 (×2): 40 mg via ORAL
  Filled 2019-03-03 (×2): qty 2

## 2019-03-03 MED ORDER — NAPHAZOLINE-GLYCERIN 0.012-0.2 % OP SOLN
1.0000 [drp] | Freq: Four times a day (QID) | OPHTHALMIC | Status: DC | PRN
  Filled 2019-03-03: qty 15

## 2019-03-03 MED ORDER — ACETAMINOPHEN 650 MG RE SUPP: 650.0000 mg | Freq: Four times a day (QID) | RECTAL | Status: DC | PRN

## 2019-03-03 MED ORDER — ADULT MULTIVITAMIN W/MINERALS CH
1.0000 | ORAL_TABLET | Freq: Every day | ORAL | Status: DC
  Administered 2019-03-04 – 2019-03-05 (×2): 1 via ORAL
  Filled 2019-03-03 (×2): qty 1

## 2019-03-03 MED ORDER — BUDESONIDE 0.5 MG/2ML IN SUSP
0.5000 mg | Freq: Every day | RESPIRATORY_TRACT | Status: DC
  Administered 2019-03-04 – 2019-03-05 (×2): 0.5 mg via RESPIRATORY_TRACT
  Filled 2019-03-03 (×2): qty 2

## 2019-03-03 MED ORDER — ASPIRIN EC 81 MG PO TBEC
81.0000 mg | DELAYED_RELEASE_TABLET | Freq: Every day | ORAL | Status: DC
  Administered 2019-03-04 – 2019-03-05 (×2): 81 mg via ORAL
  Filled 2019-03-03 (×2): qty 1

## 2019-03-03 MED ORDER — GABAPENTIN 100 MG PO CAPS
200.0000 mg | ORAL_CAPSULE | Freq: Two times a day (BID) | ORAL | Status: DC
  Administered 2019-03-03 – 2019-03-05 (×4): 200 mg via ORAL
  Filled 2019-03-03 (×4): qty 2

## 2019-03-03 MED ORDER — ENOXAPARIN SODIUM 40 MG/0.4ML ~~LOC~~ SOLN
40.0000 mg | SUBCUTANEOUS | Status: DC
  Administered 2019-03-03 – 2019-03-04 (×2): 40 mg via SUBCUTANEOUS
  Filled 2019-03-03 (×2): qty 0.4

## 2019-03-03 MED ORDER — TAMSULOSIN HCL 0.4 MG PO CAPS
0.4000 mg | ORAL_CAPSULE | Freq: Every day | ORAL | Status: DC
  Administered 2019-03-04 – 2019-03-05 (×2): 0.4 mg via ORAL
  Filled 2019-03-03 (×2): qty 1

## 2019-03-03 MED ORDER — FINASTERIDE 5 MG PO TABS
5.0000 mg | ORAL_TABLET | Freq: Every day | ORAL | Status: DC
  Administered 2019-03-04 – 2019-03-05 (×2): 5 mg via ORAL
  Filled 2019-03-03 (×2): qty 1

## 2019-03-03 MED ORDER — BISACODYL 5 MG PO TBEC: 5.0000 mg | DELAYED_RELEASE_TABLET | Freq: Every day | ORAL | Status: DC | PRN

## 2019-03-03 MED ORDER — SODIUM PHOSPHATES 45 MMOLE/15ML IV SOLN: 20.0000 mmol | Freq: Once | INTRAVENOUS | Status: DC

## 2019-03-03 NOTE — H&P (Signed)
[facility] [place] NAME: Wesley Newton    MR#:  [id number]  DATE OF BIRTH:  1927/06/16  DATE OF ADMISSION:  03/03/2019  PRIMARY CARE PHYSICIAN: Baxter Hire, MD   REQUESTING/REFERRING PHYSICIAN: Merlyn Lot, MD  CHIEF COMPLAINT:   Chief Complaint  Patient presents with   Shortness of Breath    HISTORY OF PRESENT ILLNESS:  Wesley Newton  is a 85 y.o. 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 Pt is AAOx3. He resides at [facility]. He is sharp, but is hard of hearing. He tells me he sees Dr. Raul Del of Pulmonology (last visit 02/18/2019) and Dr. Clayborn Bigness of Cardiology (last visit 01/28/2019). He states he was in the [facility] ED yesterday (Sat 03/02/2019) 2/2 SOB. He states he received nebulizer treatments, felt better and went home. He states he felt reasonably well after arriving home, though he does note having fitful sleep for ~1-2hrs. He states he woke up @~0400AM (Sunday 03/03/2019) to use the restroom, and noticed he was feeling short of breath. He endorses wheezing. He denies chest pain. He endorses cough occasionally productive of clear/greyish sputum. He endorses acute worsening of chronic low back pain. He states he told an Therapist, sports at [facility]. His daughter was contacted, and she brought him back to the ED. Pt exhibits poor air movement w/o active wheezing at the time of my assessment. He is tachypneic but non-labored. He has been non-hypoxic on room air since coming to the ED. Flu (-), CXR (-) infiltrate.  PAST MEDICAL HISTORY:   Past Medical History:  Diagnosis Date   Asthma    BPH (benign prostatic hyperplasia)    Bronchitis    Cancer (HCC)    COPD (chronic obstructive pulmonary disease) (HCC)    DVT of leg (deep venous thrombosis) (HCC)    Elevated PSA    Emphysema lung (HCC)     Emphysema of lung (HCC)    Hyperlipidemia    Nocturia    Pulmonary fibrosis (HCC)    Pulmonary fibrosis (HCC)    Skin cancer     PAST SURGICAL HISTORY:   Past Surgical History:  Procedure Laterality Date   CATARACT EXTRACTION     HERNIA REPAIR     TONSILLECTOMY      SOCIAL HISTORY:   Social History   Tobacco Use   Smoking status: Former Smoker   Smokeless tobacco: Never Used   Tobacco comment: quit 40 years  Substance Use Topics   Alcohol use: No    Alcohol/week: 0.0 standard drinks    FAMILY HISTORY:   Family History  Problem Relation Age of Onset   Heart Problems Brother    Kidney disease Neg Hx    Prostate cancer Neg Hx     DRUG ALLERGIES:   Allergies  Allergen Reactions   Amoxicillin Other (See Comments)    DID THE REACTION INVOLVE: Swelling of the face/tongue/throat, SOB, or low BP? Unknown Sudden or severe rash/hives, skin peeling, or the inside of the mouth or nose? Unknown Did it require medical treatment? Unknown When did it last happen?Unknown If all above answers are NO, may proceed with cephalosporin use.    Codeine Nausea And Vomiting   Rapaflo [Silodosin] Other (See Comments)    Reaction:  Unknown    Tramadol Nausea And Vomiting and Other (See Comments)  Pt states that this medication makes him feel crazy.     Trimethoprim Other (See Comments)    Reaction: unknown    REVIEW OF SYSTEMS:   Review of Systems  Constitutional: Negative for chills, diaphoresis, fever, malaise/fatigue and weight loss.  HENT: Positive for hearing loss. Negative for congestion, ear pain, nosebleeds, sinus pain, sore throat and tinnitus.   Eyes: Negative for blurred vision, double vision and photophobia.  Respiratory: Positive for cough, sputum production, shortness of breath and wheezing. Negative for hemoptysis.   Cardiovascular: Negative for chest pain, palpitations, orthopnea, claudication, leg swelling and PND.  Gastrointestinal:  Negative for abdominal pain, blood in stool, constipation, diarrhea, heartburn, melena, nausea and vomiting.  Genitourinary: Negative for dysuria, frequency, hematuria and urgency.  Musculoskeletal: Positive for back pain. Negative for falls, joint pain, myalgias and neck pain.  Skin: Negative for itching and rash.  Neurological: Negative for dizziness, tingling, tremors, sensory change, speech change, focal weakness, seizures, loss of consciousness, weakness and headaches.  Psychiatric/Behavioral: Negative for depression and memory loss. The patient has insomnia. The patient is not nervous/anxious.    MEDICATIONS AT HOME:   Prior to Admission medications   Medication Sig Start Date End Date Taking? Authorizing Provider  albuterol (PROVENTIL HFA;VENTOLIN HFA) 108 (90 Base) MCG/ACT inhaler Inhale 2 puffs into the lungs every 6 (six) hours as needed for wheezing or shortness of breath.    [provider]  albuterol (PROVENTIL) (2.5 MG/3ML) 0.083% nebulizer solution Take 2.5 mg by nebulization every 6 (six) hours as needed for wheezing or shortness of breath.    [provider]  aspirin EC 81 MG tablet Take 81 mg by mouth daily.     [provider]  azithromycin (ZITHROMAX Z-PAK) 250 MG tablet Take 2 tablets (500 mg) on  Day 1,  followed by 1 tablet (250 mg) once daily on Days 2 through 5. 03/02/19 03/07/19  Harvest Dark, MD  benzonatate (TESSALON) 100 MG capsule Take 1 capsule (100 mg total) by mouth 3 (three) times daily as needed for cough. 12/21/18   Henreitta Leber, MD  budesonide (PULMICORT) 0.5 MG/2ML nebulizer solution Take 0.5 mg by nebulization daily.    [provider]  feeding supplement, ENSURE ENLIVE, (ENSURE ENLIVE) LIQD Take 237 mLs by mouth 2 (two) times daily between meals. 12/21/18   Henreitta Leber, MD  finasteride (PROSCAR) 5 MG tablet Take 1 tablet (5 mg total) by mouth daily. 02/07/18   Zara Council A, PA-C  gabapentin (NEURONTIN) 100 MG  capsule Take 200 mg by mouth 2 (two) times daily. 03/27/17   [provider]  isosorbide mononitrate (IMDUR) 30 MG 24 hr tablet Take 30 mg by mouth daily.    [provider]  LORazepam (ATIVAN) 0.5 MG tablet Take 0.5 mg by mouth every 8 (eight) hours as needed.  03/28/17   [provider]  MAGNESIUM PO Take 1 tablet by mouth daily.     [provider]  Multiple Vitamin (MULTIVITAMIN WITH MINERALS) TABS tablet Take 1 tablet by mouth daily.    [provider]  Multiple Vitamins-Minerals (PRESERVISION AREDS 2 PO) Take 1 capsule by mouth 2 (two) times daily.    [provider]  naphazoline-glycerin (CLEAR EYES REDNESS) 0.012-0.2 % SOLN Place 1-2 drops into both eyes 4 (four) times daily as needed for eye irritation. 12/21/18   Henreitta Leber, MD  naproxen (NAPROSYN) 375 MG tablet Take 375 mg by mouth 3 (three) times daily with meals.  [provider]  omeprazole (PRILOSEC) 40 MG capsule Take 40 mg by mouth daily.     [provider]  predniSONE (DELTASONE) 20 MG tablet Take 2 tablets (40 mg total) by mouth daily. 03/02/19   Harvest Dark, MD  simvastatin (ZOCOR) 40 MG tablet Take 40 mg by mouth at bedtime.     [provider]  tamsulosin (FLOMAX) 0.4 MG CAPS capsule Take 1 capsule (0.4 mg total) by mouth daily. 12/21/18   Henreitta Leber, MD      VITAL SIGNS:  Blood pressure (!) 156/64, pulse (!) 115, temperature 98.3 F (36.8 C), temperature source Oral, resp. rate 17, height 5\' 6"  (1.676 m), weight 66.1 kg, SpO2 99 %.  PHYSICAL EXAMINATION:  Physical Exam Constitutional:      General: He is not in acute distress.    Appearance: He is ill-appearing. He is not toxic-appearing or diaphoretic.     Interventions: He is not intubated. HENT:     Head: Atraumatic.     Mouth/Throat:     Pharynx: Oropharynx is clear.  Eyes:     General: No scleral icterus.    Extraocular Movements: Extraocular movements intact.      Conjunctiva/sclera: Conjunctivae normal.  Neck:     Musculoskeletal: Neck supple.  Cardiovascular:     Rate and Rhythm: Regular rhythm. Tachycardia present.  No extrasystoles are present.    Heart sounds: Normal heart sounds, S1 normal and S2 normal. No murmur. No friction rub. No gallop. No S3 or S4 sounds.   Pulmonary:     Effort: Tachypnea and prolonged expiration present. No bradypnea, accessory muscle usage, respiratory distress or retractions. He is not intubated.     Breath sounds: Decreased air movement present. No stridor or transmitted upper airway sounds. Examination of the right-upper field reveals decreased breath sounds. Examination of the left-upper field reveals decreased breath sounds. Examination of the right-middle field reveals decreased breath sounds. Examination of the left-middle field reveals decreased breath sounds. Examination of the right-lower field reveals decreased breath sounds. Examination of the left-lower field reveals decreased breath sounds. Decreased breath sounds present. No wheezing, rhonchi or rales.  Abdominal:     General: Bowel sounds are decreased. There is no distension.     Palpations: Abdomen is soft.     Tenderness: There is no abdominal tenderness. There is no guarding or rebound.  Musculoskeletal: Normal range of motion.        General: No swelling or tenderness.     Right lower leg: No edema.     Left lower leg: No edema.  Lymphadenopathy:     Cervical: No cervical adenopathy.  Skin:    General: Skin is warm and dry.     Findings: No erythema or rash.  Neurological:     Mental Status: He is alert and oriented to person, place, and time. Mental status is at baseline.  Psychiatric:        Mood and Affect: Mood normal.        Behavior: Behavior normal.        Thought Content: Thought content normal.        Judgment: Judgment normal.    LABORATORY PANEL:   CBC Recent Labs  Lab 03/03/19 1800  WBC 12.0*  HGB 12.8*  HCT 39.3  PLT  338   ------------------------------------------------------------------------------------------------------------------  Chemistries  Recent Labs  Lab 03/03/19 1800  NA 135  K 4.2  CL 101  CO2 26  GLUCOSE 131*  BUN 21  CREATININE 1.23  CALCIUM 9.1  MG 2.5*  AST 46*  ALT 23  ALKPHOS 72  BILITOT 0.4   ------------------------------------------------------------------------------------------------------------------  Cardiac Enzymes Recent Labs  Lab 03/02/19 0803  TROPONINI <0.03   ------------------------------------------------------------------------------------------------------------------  RADIOLOGY:  Dg Chest 2 View  Result Date: 03/03/2019 CLINICAL DATA:  Shortness of breath. EXAM: CHEST - 2 VIEW COMPARISON:  March 02, 2019 FINDINGS: Peripheral reticular changes, particularly in the bases are stable consistent with known fibrosis. No acute infiltrate. The cardiomediastinal silhouette is normal. IMPRESSION: Known fibrosis.  No acute infiltrate. Electronically Signed   By: Dorise Bullion III M.D   On: 03/03/2019 18:17   Dg Chest 2 View  Result Date: 03/02/2019 CLINICAL DATA:  Acute cough. History of COPD and pulmonary fibrosis. EXAM: CHEST - 2 VIEW COMPARISON:  12/18/2018 chest radiograph, 02/05/2019 chest CT and prior studies FINDINGS: The cardiomediastinal silhouette is unremarkable. Bilateral interstitial lung again noted. No definite new pulmonary opacities are airspace opacities noted. No pleural effusion or pneumothorax. No acute bony abnormalities are identified. IMPRESSION: Chronic interstitial lung disease.  No definite acute abnormality. Electronically Signed   By: Margarette Canada M.D.   On: 03/02/2019 08:22   IMPRESSION AND PLAN:   A/P: 58M w/ PMHx COPD/emphysema (no home O2), UIP, OSA (CPAP intolerant), chronic diastolic CHF (EF 17% w/ grade I diastolic dysfxn as of 49/44/9675 Echo) p/w SOB, acute COPD exacerbation (w/o hypoxemic respiratory failure).  Hyperglycemia, hypophosphatemia, hypermagnesemia, mild transaminasemia, leukocytosis, mild normocytic anemia. -SOB, acute COPD exacerbation: Pt p/w 2d Hx SOB, improved in ED w/ treatment yesterday (Sat 03/02/2019). Now returns to ED w/ recurrent symptoms. Endorses improvement w/ nebs since returning. Stable room air SpO2 at the time of my assessment. (-) active wheezing, (+) poor air movement. Not clinically volume overloaded. WBC 12.0, tachycardic, tachypneic, SIRS (+). Flu (-). CXR (-) infiltrate. Afebrile, no obvious infxn. Nebs (Duo + Budesonide), steroids, incentive spirometry, pulmonary toileting, O2, pulse ox. Prior documentation w/ Hx UIP/pulmonary fibrosis, Echo to evaluate PA pressure. CPAP intolerant, may be worsening symptoms. -Hypophosphatemia: Phos 2.2. Replete and monitor. -Hypermagnesemia, transaminasmeia: Mag 2.5. AST 46. ALT WNL. Volume depleted. Gentle IVF. -Normocytic anemia: Mild, stable. Low suspicion for active/acute bleeding at present. -c/w other home meds/formulary subs as tolerated. -FEN/GI: Cardiac diet. -DVT PPx: Lovenox. -Code status: DNR/DNI. -Disposition: Admission, > 2 midnights.   All the records are reviewed and case discussed with ED provider. Management plans discussed with the patient, family and they are in agreement.  CODE STATUS: DNR/DNI.  TOTAL TIME TAKING CARE OF THIS PATIENT: 75 minutes.    Arta Silence M.D on 03/03/2019 at 11:21 PM  Between 7am to 6pm - Pager - [phone]  After 6pm go to [email or web address] - Proofreader  [employer] [facility]  Office  [phone]  CC: Primary care physician; Baxter Hire, MD   Note: This dictation was prepared with Dragon dictation along with smaller phrase technology. Any transcriptional errors that result from this process are unintentional.

## 2019-03-03 NOTE — ED Triage Notes (Signed)
Mask applied.  Pt discharged yesterday.  Back today for [facility].  Labored in triage.  Pt has been taking his abx and steroids.  Nurse at [facility] took his temp and reports it was over 100 and told him to go back and get checked in ED again.  Denies pain at this time.  This RN discharged pt yesterday, was not labored like this yesterday.

## 2019-03-03 NOTE — ED Provider Notes (Signed)
[facility] Emergency Department Provider Note    First MD Initiated Contact with Patient 03/03/19 1747     (approximate)  I have reviewed the triage vital signs and the nursing notes.   HISTORY  Chief Complaint Shortness of Breath    HPI Wesley Newton is a 85 y.o. male below listed past medical history COPD not on home oxygen presents the ER for second time in 24 hours for worsening shortness of breath.  States he was feeling some improvement after nebulizer treatments but today had an episode where he felt much more short of breath and was actually found to be with mild temperature to 100.3.  Was directed to the ER for further evaluation.  He is a DNR/DNI.  Denies any pain at this time but does feel winded.    Past Medical History:  Diagnosis Date  . Asthma   . BPH (benign prostatic hyperplasia)   . Bronchitis   . Cancer ([place])   . COPD (chronic obstructive pulmonary disease) ([place])   . DVT of leg (deep venous thrombosis) ([place])   . Elevated PSA   . Emphysema lung ([place])   . Emphysema of lung ([place])   . Hyperlipidemia   . Nocturia   . Pulmonary fibrosis ([place])   . Pulmonary fibrosis ([place])   . Skin cancer    Family History  Problem Relation Age of Onset  . Heart Problems Brother   . Kidney disease Neg Hx   . Prostate cancer Neg Hx    Past Surgical History:  Procedure Laterality Date  . CATARACT EXTRACTION    . HERNIA REPAIR    . TONSILLECTOMY     Patient Active Problem List   Diagnosis Date Noted  . Malnutrition of moderate degree 12/21/2018  . Influenza B 12/19/2018  . CAP (community acquired pneumonia) 12/19/2018  . PAD (peripheral artery disease) ([place]) 04/25/2017  . BPH (benign prostatic hyperplasia) 01/10/2016  . Nocturia 01/10/2016  . Benign prostatic hyperplasia with urinary obstruction 01/10/2016  . AB (asthmatic bronchitis) 12/17/2014  . Carotid artery disease ([place]) 12/04/2014  . GERD (gastroesophageal reflux disease)  12/04/2014  . H/O respiratory system disease 12/04/2014  . H/O peptic ulcer 12/04/2014  . Hypercholesterolemia 12/04/2014  . Unstable angina pectoris ([place]) 12/04/2014  . Amnesia 12/04/2014  . Chronic obstructive pulmonary disease ([place]) 06/11/2014  . Cephalalgia 06/11/2014  . HTN (hypertension) 06/11/2014      Prior to Admission medications   Medication Sig Start Date End Date Taking? Authorizing Provider  albuterol (PROVENTIL HFA;VENTOLIN HFA) 108 (90 Base) MCG/ACT inhaler Inhale 2 puffs into the lungs every 6 (six) hours as needed for wheezing or shortness of breath.    [provider]  albuterol (PROVENTIL) (2.5 MG/3ML) 0.083% nebulizer solution Take 2.5 mg by nebulization every 6 (six) hours as needed for wheezing or shortness of breath.    [provider]  aspirin EC 81 MG tablet Take 81 mg by mouth daily.     [provider]  azithromycin (ZITHROMAX Z-PAK) 250 MG tablet Take 2 tablets (500 mg) on  Day 1,  followed by 1 tablet (250 mg) once daily on Days 2 through 5. 03/02/19 03/07/19  Harvest Dark, MD  benzonatate (TESSALON) 100 MG capsule Take 1 capsule (100 mg total) by mouth 3 (three) times daily as needed for cough. 12/21/18   Henreitta Leber, MD  budesonide (PULMICORT) 0.5 MG/2ML nebulizer solution Take 0.5 mg by nebulization daily.    [provider]  feeding supplement, ENSURE ENLIVE, (ENSURE ENLIVE) LIQD Take 237 mLs by mouth 2 (two) times daily between meals. 12/21/18   Henreitta Leber, MD  finasteride (PROSCAR) 5 MG tablet Take 1 tablet (5 mg total) by mouth daily. 02/07/18   Zara Council A, PA-C  gabapentin (NEURONTIN) 100 MG capsule Take 200 mg by mouth 2 (two) times daily. 03/27/17   [provider]  isosorbide mononitrate (IMDUR) 30 MG 24 hr tablet Take 30 mg by mouth daily.    [provider]  LORazepam (ATIVAN) 0.5 MG tablet Take 0.5 mg by mouth every 8 (eight) hours as needed.  03/28/17   [provider]   MAGNESIUM PO Take 1 tablet by mouth daily.     [provider]  Multiple Vitamin (MULTIVITAMIN WITH MINERALS) TABS tablet Take 1 tablet by mouth daily.    [provider]  Multiple Vitamins-Minerals (PRESERVISION AREDS 2 PO) Take 1 capsule by mouth 2 (two) times daily.    [provider]  naphazoline-glycerin (CLEAR EYES REDNESS) 0.012-0.2 % SOLN Place 1-2 drops into both eyes 4 (four) times daily as needed for eye irritation. 12/21/18   Henreitta Leber, MD  naproxen (NAPROSYN) 375 MG tablet Take 375 mg by mouth 3 (three) times daily with meals.     [provider]  omeprazole (PRILOSEC) 40 MG capsule Take 40 mg by mouth daily.     [provider]  predniSONE (DELTASONE) 20 MG tablet Take 2 tablets (40 mg total) by mouth daily. 03/02/19   Harvest Dark, MD  simvastatin (ZOCOR) 40 MG tablet Take 40 mg by mouth at bedtime.     [provider]  tamsulosin (FLOMAX) 0.4 MG CAPS capsule Take 1 capsule (0.4 mg total) by mouth daily. 12/21/18   Henreitta Leber, MD    Allergies Amoxicillin; Codeine; Rapaflo [silodosin]; Tramadol; and Trimethoprim    Social History Social History   Tobacco Use  . Smoking status: Former Research scientist (life sciences)  . Smokeless tobacco: Never Used  . Tobacco comment: quit 40 years  Substance Use Topics  . Alcohol use: No    Alcohol/week: 0.0 standard drinks  . Drug use: No    Review of Systems Patient denies headaches, rhinorrhea, blurry vision, numbness, shortness of breath, chest pain, edema, cough, abdominal pain, nausea, vomiting, diarrhea, dysuria, fevers, rashes or hallucinations unless otherwise stated above in HPI. ____________________________________________   PHYSICAL EXAM:  VITAL SIGNS: Vitals:   03/03/19 1800 03/03/19 1830  BP: (!) 173/73 (!) 153/65  Pulse: 92 94  Resp: (!) 22 (!) 29  Temp:    SpO2: 98% 93%    Constitutional: Alert and oriented.  Eyes: Conjunctivae are normal.  Head: Atraumatic.  Nose: No congestion/rhinnorhea. Mouth/Throat: Mucous membranes are moist.   Neck: No stridor. Painless ROM.  Cardiovascular: Normal rate, regular rhythm. Grossly normal heart sounds.  Good peripheral circulation. Respiratory: Normal respiratory effort.  No retractions.  With coarse expiratory wheeze throughout gastrointestinal: Soft and nontender. No distention. No abdominal bruits. No CVA tenderness. Genitourinary:  Musculoskeletal: No lower extremity tenderness nor edema.  No joint effusions. Neurologic:  Normal speech and language. No gross focal neurologic deficits are appreciated. No facial droop Skin:  Skin is warm, dry and intact. No rash noted. Psychiatric: Mood and affect are normal. Speech and behavior are normal.  ____________________________________________   LABS (all labs ordered are listed, but only abnormal results are displayed)  Results for orders placed or performed during the hospital encounter of 03/03/19 (from the past 24  hour(s))  CBC with Differential/Platelet     Status: Abnormal   Collection Time: 03/03/19  6:00 PM  Result Value Ref Range   WBC 12.0 (H) 4.0 - 10.5 K/uL   RBC 4.67 4.22 - 5.81 MIL/uL   Hemoglobin 12.8 (L) 13.0 - 17.0 g/dL   HCT 39.3 39.0 - 52.0 %   MCV 84.2 80.0 - 100.0 fL   MCH 27.4 26.0 - 34.0 pg   MCHC 32.6 30.0 - 36.0 g/dL   RDW 15.2 11.5 - 15.5 %   Platelets 338 150 - 400 K/uL   nRBC 0.0 0.0 - 0.2 %   Neutrophils Relative % 81 %   Neutro Abs 9.7 (H) 1.7 - 7.7 K/uL   Lymphocytes Relative 16 %   Lymphs Abs 1.9 0.7 - 4.0 K/uL   Monocytes Relative 2 %   Monocytes Absolute 0.3 0.1 - 1.0 K/uL   Eosinophils Relative 0 %   Eosinophils Absolute 0.0 0.0 - 0.5 K/uL   Basophils Relative 0 %   Basophils Absolute 0.0 0.0 - 0.1 K/uL   Immature Granulocytes 1 %   Abs Immature Granulocytes 0.06 0.00 - 0.07 K/uL  Comprehensive metabolic panel     Status: Abnormal   Collection Time: 03/03/19  6:00 PM  Result Value Ref Range   Sodium 135 135 -  145 mmol/L   Potassium 4.2 3.5 - 5.1 mmol/L   Chloride 101 98 - 111 mmol/L   CO2 26 22 - 32 mmol/L   Glucose, Bld 131 (H) 70 - 99 mg/dL   BUN 21 8 - 23 mg/dL   Creatinine, Ser 1.23 0.61 - 1.24 mg/dL   Calcium 9.1 8.9 - 10.3 mg/dL   Total Protein 7.6 6.5 - 8.1 g/dL   Albumin 4.1 3.5 - 5.0 g/dL   AST 46 (H) 15 - 41 U/L   ALT 23 0 - 44 U/L   Alkaline Phosphatase 72 38 - 126 U/L   Total Bilirubin 0.4 0.3 - 1.2 mg/dL   GFR calc non Af Amer 51 (L) >60 mL/min   GFR calc Af Amer 60 (L) >60 mL/min   Anion gap 8 5 - 15  Influenza panel by PCR (type A & B)     Status: None   Collection Time: 03/03/19  6:00 PM  Result Value Ref Range   Influenza A By PCR NEGATIVE NEGATIVE   Influenza B By PCR NEGATIVE NEGATIVE   ____________________________________________  EKG My review and personal interpretation at Time:   17:43 Indication: sob  Rate: 95  Rhythm: sinus Axis: normal Other: normal intervals, no stemi ____________________________________________  RADIOLOGY  I personally reviewed all radiographic images ordered to evaluate for the above acute complaints and reviewed radiology reports and findings.  These findings were personally discussed with the patient.  Please see medical record for radiology report.  ____________________________________________   PROCEDURES  Procedure(s) performed:  Procedures    Critical Care performed: no ____________________________________________   INITIAL IMPRESSION / ASSESSMENT AND PLAN / ED COURSE  Pertinent labs & imaging results that were available during my care of the patient were reviewed by me and considered in my medical decision making (see chart for details).   DDX: Asthma, copd, CHF, pna, ptx, malignancy, Pe, anemia   Wesley Newton is a 85 y.o. who presents to the ED with symptoms concerning for COPD exacerbation.  Will give nebulizers as well as another dose of steroids.  Blood work and x-rays will be sent for the by differential.  Clinical Course as of Mar 02 1954  Sun Mar 03, 2019  9774 Patient with persistent tachypnea.  He is not having any hypoxia after nebulizers but based on his work of breathing do believe he would benefit from observation in the hospital.   [PR]    Clinical Course User Index [PR] Merlyn Lot, MD     As part of my medical decision making, I reviewed the following data within the [place] [facility] notes reviewed and incorporated, Labs reviewed, notes from prior ED visits and [place] Controlled Substance Database   ____________________________________________   FINAL CLINICAL IMPRESSION(S) / ED DIAGNOSES  Final diagnoses:  Subcutaneous emphysema resulting from a procedure, initial encounter  COPD exacerbation ([place])      NEW MEDICATIONS STARTED DURING THIS VISIT:  New Prescriptions   No medications on file     Note:  This document was prepared using Dragon voice recognition software and may include unintentional dictation errors.    Merlyn Lot, MD 03/03/19 Karl [place]

## 2019-03-03 NOTE — ED Notes (Signed)
ED TO INPATIENT HANDOFF REPORT Nurse Anderson Malta [phone]  S Name/Age/Gender Wesley Newton 85 y.o. male Room/Bed: ED17A/ED17A  Code Status   Code Status: Prior  Home/SNF/Other Home / [place] independent living apartment Patient oriented to: self, place, time and situation Is this baseline? Yes   Triage Complete: Triage complete  Chief Complaint sob, cough  Triage Note Mask applied.  Pt discharged yesterday.  Back today for [facility].  Labored in triage.  Pt has been taking his abx and steroids.  Nurse at [facility] took his temp and reports it was over 100 and told him to go back and get checked in ED again.  Denies pain at this time.  This RN discharged pt yesterday, was not labored like this yesterday.     Allergies Allergies  Allergen Reactions  . Amoxicillin Other (See Comments)    DID THE REACTION INVOLVE: Swelling of the face/tongue/throat, SOB, or low BP? Unknown Sudden or severe rash/hives, skin peeling, or the inside of the mouth or nose? Unknown Did it require medical treatment? Unknown When did it last happen?Unknown If all above answers are "NO", may proceed with cephalosporin use.   . Codeine Nausea And Vomiting  . Rapaflo [Silodosin] Other (See Comments)    Reaction:  Unknown   . Tramadol Nausea And Vomiting and Other (See Comments)    Pt states that this medication makes him feel crazy.    . Trimethoprim Other (See Comments)    Reaction: unknown    Level of Care/Admitting Diagnosis ED Disposition    ED Disposition Condition Comment   Admit  Hospital Area: [facility] [[id number]]  Level of Care: Telemetry [5]  Diagnosis: Acute exacerbation of chronic obstructive pulmonary disease (COPD) ([place]) [130865]  Admitting Physician: Arta Silence [[id number]]  Attending Physician: Arta Silence [[id number]]  PT Class (Do Not Modify): Observation [104]  PT Acc Code (Do Not Modify): Observation [10022]       B Medical/Surgery  History Past Medical History:  Diagnosis Date  . Asthma   . BPH (benign prostatic hyperplasia)   . Bronchitis   . Cancer ([place])   . COPD (chronic obstructive pulmonary disease) ([place])   . DVT of leg (deep venous thrombosis) ([place])   . Elevated PSA   . Emphysema lung ([place])   . Emphysema of lung ([place])   . Hyperlipidemia   . Nocturia   . Pulmonary fibrosis ([place])   . Pulmonary fibrosis ([place])   . Skin cancer    Past Surgical History:  Procedure Laterality Date  . CATARACT EXTRACTION    . HERNIA REPAIR    . TONSILLECTOMY       A IV Location/Drains/Wounds Patient Lines/Drains/Airways Status   Active Line/Drains/Airways    Name:   Placement date:   Placement time:   Site:   Days:   Peripheral IV 03/03/19 Left Antecubital   03/03/19    1800    Antecubital   less than 1          Intake/Output Last 24 hours No intake or output data in the 24 hours ending 03/03/19 2035  Labs/Imaging Results for orders placed or performed during the hospital encounter of 03/03/19 (from the past 48 hour(s))  CBC with Differential/Platelet     Status: Abnormal   Collection Time: 03/03/19  6:00 PM  Result Value Ref Range   WBC 12.0 (H) 4.0 - 10.5 K/uL   RBC 4.67 4.22 - 5.81 MIL/uL   Hemoglobin 12.8 (L) 13.0 - 17.0 g/dL  HCT 39.3 39.0 - 52.0 %   MCV 84.2 80.0 - 100.0 fL   MCH 27.4 26.0 - 34.0 pg   MCHC 32.6 30.0 - 36.0 g/dL   RDW 15.2 11.5 - 15.5 %   Platelets 338 150 - 400 K/uL   nRBC 0.0 0.0 - 0.2 %   Neutrophils Relative % 81 %   Neutro Abs 9.7 (H) 1.7 - 7.7 K/uL   Lymphocytes Relative 16 %   Lymphs Abs 1.9 0.7 - 4.0 K/uL   Monocytes Relative 2 %   Monocytes Absolute 0.3 0.1 - 1.0 K/uL   Eosinophils Relative 0 %   Eosinophils Absolute 0.0 0.0 - 0.5 K/uL   Basophils Relative 0 %   Basophils Absolute 0.0 0.0 - 0.1 K/uL   Immature Granulocytes 1 %   Abs Immature Granulocytes 0.06 0.00 - 0.07 K/uL    Comment: Performed at [facility], [address] [place], [place] [address]   Comprehensive metabolic panel     Status: Abnormal   Collection Time: 03/03/19  6:00 PM  Result Value Ref Range   Sodium 135 135 - 145 mmol/L   Potassium 4.2 3.5 - 5.1 mmol/L   Chloride 101 98 - 111 mmol/L   CO2 26 22 - 32 mmol/L   Glucose, Bld 131 (H) 70 - 99 mg/dL   BUN 21 8 - 23 mg/dL   Creatinine, Ser 1.23 0.61 - 1.24 mg/dL   Calcium 9.1 8.9 - 10.3 mg/dL   Total Protein 7.6 6.5 - 8.1 g/dL   Albumin 4.1 3.5 - 5.0 g/dL   AST 46 (H) 15 - 41 U/L   ALT 23 0 - 44 U/L   Alkaline Phosphatase 72 38 - 126 U/L   Total Bilirubin 0.4 0.3 - 1.2 mg/dL   GFR calc non Af Amer 51 (L) >60 mL/min   GFR calc Af Amer 60 (L) >60 mL/min   Anion gap 8 5 - 15    Comment: Performed at [facility], [address] [place], [place] [address]  Influenza panel by PCR (type A & B)     Status: None   Collection Time: 03/03/19  6:00 PM  Result Value Ref Range   Influenza A By PCR NEGATIVE NEGATIVE   Influenza B By PCR NEGATIVE NEGATIVE    Comment: (NOTE) The Xpert Xpress Flu assay is intended as an aid in the diagnosis of  influenza and should not be used as a sole basis for treatment.  This  assay is FDA approved for nasopharyngeal swab specimens only. Nasal  washings and aspirates are unacceptable for Xpert Xpress Flu testing. Performed at [facility], [address] [place], [place] [address]    Dg Chest 2 View  Result Date: 03/03/2019 CLINICAL DATA:  Shortness of breath. EXAM: CHEST - 2 VIEW COMPARISON:  March 02, 2019 FINDINGS: Peripheral reticular changes, particularly in the bases are stable consistent with known fibrosis. No acute infiltrate. The cardiomediastinal silhouette is normal. IMPRESSION: Known fibrosis.  No acute infiltrate. Electronically Signed   By: Dorise Bullion III M.D   On: 03/03/2019 18:17   Dg Chest 2 View  Result Date: 03/02/2019 CLINICAL DATA:  Acute cough. History of COPD and pulmonary fibrosis. EXAM: CHEST - 2 VIEW COMPARISON:  12/18/2018 chest  radiograph, 02/05/2019 chest CT and prior studies FINDINGS: The cardiomediastinal silhouette is unremarkable. Bilateral interstitial lung again noted. No definite new pulmonary opacities are airspace opacities noted. No pleural effusion or pneumothorax. No acute bony  abnormalities are identified. IMPRESSION: Chronic interstitial lung disease.  No definite acute abnormality. Electronically Signed   By: Margarette Canada M.D.   On: 03/02/2019 08:22    Pending Labs Unresulted Labs (From admission, onward)    Start     Ordered   03/03/19 1750  Blood culture (routine x 2)  BLOOD CULTURE X 2,   STAT     03/03/19 1751          Vitals/Pain Today's Vitals   03/03/19 1739 03/03/19 1800 03/03/19 1830  BP: (!) 139/98 (!) 173/73 (!) 153/65  Pulse: (!) 101 92 94  Resp: (!) 26 (!) 22 (!) 29  Temp: 98.5 F (36.9 C)    TempSrc: Oral    SpO2: 91% 98% 93%  PainSc: 0-No pain      Isolation Precautions Droplet precaution  Medications Medications  sodium chloride 0.9 % bolus 250 mL (has no administration in time range)  ipratropium-albuterol (DUONEB) 0.5-2.5 (3) MG/3ML nebulizer solution 3 mL (has no administration in time range)  methylPREDNISolone sodium succinate (SOLU-MEDROL) 125 mg/2 mL injection 60 mg (has no administration in time range)    Followed by  predniSONE (DELTASONE) tablet 40 mg (has no administration in time range)  ipratropium-albuterol (DUONEB) 0.5-2.5 (3) MG/3ML nebulizer solution 3 mL (3 mLs Nebulization Given 03/03/19 1836)  ipratropium-albuterol (DUONEB) 0.5-2.5 (3) MG/3ML nebulizer solution 3 mL (3 mLs Nebulization Given 03/03/19 1836)  methylPREDNISolone sodium succinate (SOLU-MEDROL) 125 mg/2 mL injection 80 mg (80 mg Intravenous Given 03/03/19 1833)    Mobility walks Low fall risk   Focused Assessments Pulmonary Assessment Handoff:  Lung sounds: Bilateral Breath Sounds: Diminished O2 Device: Room Air        R Recommendations: See Admitting Provider Note  Report  given to:   Additional Notes:

## 2019-03-04 ENCOUNTER — Encounter: Payer: Self-pay | Admitting: Pulmonary Disease

## 2019-03-04 ENCOUNTER — Observation Stay
Admit: 2019-03-04 | Discharge: 2019-03-04 | Disposition: A | Discharge status: Home / Self Care | Payer: Medicare Other | Attending: Internal Medicine | Admitting: Internal Medicine

## 2019-03-04 LAB — BASIC METABOLIC PANEL
Anion gap: 6 (ref 5–15)
BUN: 18 mg/dL (ref 8–23)
CO2: 26 mmol/L (ref 22–32)
CREATININE: 1 mg/dL (ref 0.61–1.24)
Calcium: 9 mg/dL (ref 8.9–10.3)
Chloride: 107 mmol/L (ref 98–111)
GFR calc Af Amer: >60 mL/min (ref >60)
GFR calc non Af Amer: >60 mL/min (ref >60)
GLUCOSE: 132 mg/dL — AB (ref 70–99)
Potassium: 4.7 mmol/L (ref 3.5–5.1)
Sodium: 139 mmol/L (ref 135–145)

## 2019-03-04 LAB — RESPIRATORY PANEL BY PCR
Adenovirus: NOT DETECTED
Bordetella pertussis: NOT DETECTED
Chlamydophila pneumoniae: NOT DETECTED
Coronavirus 229E: NOT DETECTED
Coronavirus HKU1: NOT DETECTED
Coronavirus NL63: NOT DETECTED
Coronavirus OC43: NOT DETECTED
Influenza A: NOT DETECTED
Influenza B: NOT DETECTED
METAPNEUMOVIRUS-RVPPCR: NOT DETECTED
Mycoplasma pneumoniae: NOT DETECTED
PARAINFLUENZA VIRUS 1-RVPPCR: NOT DETECTED
PARAINFLUENZA VIRUS 2-RVPPCR: NOT DETECTED
PARAINFLUENZA VIRUS 3-RVPPCR: NOT DETECTED
Parainfluenza Virus 4: NOT DETECTED
RHINOVIRUS / ENTEROVIRUS - RVPPCR: NOT DETECTED
Respiratory Syncytial Virus: NOT DETECTED

## 2019-03-04 LAB — CBC
HCT: 38.9 % — ABNORMAL LOW (ref 39.0–52.0)
Hemoglobin: 12.7 g/dL — ABNORMAL LOW (ref 13.0–17.0)
MCH: 27.4 pg (ref 26.0–34.0)
MCHC: 32.6 g/dL (ref 30.0–36.0)
MCV: 83.8 fL (ref 80.0–100.0)
Platelets: 337 10*3/uL (ref 150–400)
RBC: 4.64 MIL/uL (ref 4.22–5.81)
RDW: 15.5 % (ref 11.5–15.5)
WBC: 11 10*3/uL — ABNORMAL HIGH (ref 4.0–10.5)
nRBC: 0 % (ref 0.0–0.2)

## 2019-03-04 LAB — GLUCOSE, CAPILLARY
Glucose-Capillary: 116 mg/dL — ABNORMAL HIGH (ref 70–99)
Glucose-Capillary: 118 mg/dL — ABNORMAL HIGH (ref 70–99)
Glucose-Capillary: 189 mg/dL — ABNORMAL HIGH (ref 70–99)

## 2019-03-04 LAB — ECHOCARDIOGRAM COMPLETE
Height: 66 in
Weight: 2331.2 oz

## 2019-03-04 LAB — PROCALCITONIN: Procalcitonin: <0.1 ng/mL

## 2019-03-04 LAB — LACTIC ACID, PLASMA: Lactic Acid, Venous: 1.1 mmol/L (ref 0.5–1.9)

## 2019-03-04 LAB — MRSA PCR SCREENING: MRSA by PCR: POSITIVE — AB

## 2019-03-04 MED ORDER — IPRATROPIUM-ALBUTEROL 0.5-2.5 (3) MG/3ML IN SOLN
3.0000 mL | Freq: Four times a day (QID) | RESPIRATORY_TRACT | Status: DC
  Administered 2019-03-04 – 2019-03-05 (×5): 3 mL via RESPIRATORY_TRACT
  Filled 2019-03-04 (×5): qty 3

## 2019-03-04 MED ORDER — MUPIROCIN 2 % EX OINT
1.0000 "application " | TOPICAL_OINTMENT | Freq: Two times a day (BID) | CUTANEOUS | Status: DC
  Administered 2019-03-04 – 2019-03-05 (×3): 1 via NASAL
  Filled 2019-03-04: qty 22

## 2019-03-04 MED ORDER — AZITHROMYCIN 250 MG PO TABS
250.0000 mg | ORAL_TABLET | Freq: Every day | ORAL | Status: DC
  Administered 2019-03-04 – 2019-03-05 (×2): 250 mg via ORAL
  Filled 2019-03-04 (×2): qty 1

## 2019-03-04 MED ORDER — PHENOL 1.4 % MT LIQD
1.0000 | OROMUCOSAL | Status: DC | PRN
  Filled 2019-03-04: qty 177

## 2019-03-04 MED ORDER — CHLORHEXIDINE GLUCONATE CLOTH 2 % EX PADS
6.0000 | MEDICATED_PAD | Freq: Every day | CUTANEOUS | Status: DC
  Administered 2019-03-04 – 2019-03-05 (×2): 6 via TOPICAL

## 2019-03-04 NOTE — Progress Notes (Signed)
[facility] [place] NAME: Wesley Newton    MR#:  [id number]  DATE OF BIRTH:  04-22-1927  SUBJECTIVE:  CHIEF COMPLAINT:   Chief Complaint  Patient presents with  . Shortness of Breath   Feeling slightly better today. Breathing on RA. No subjective chills or fevers. Concerned about long-term "answers" for managing his lung conditions. Being seen by pulmonology after I saw him.  REVIEW OF SYSTEMS:  Review of Systems  Constitutional: Negative for chills, diaphoresis, fever, malaise/fatigue and weight loss.  HENT: Positive for hearing loss. Negative for congestion, ear pain, nosebleeds, sinus pain, sore throat and tinnitus.   Eyes: Negative for blurred vision, double vision and photophobia.  Respiratory: Positive for cough, sputum production, shortness of breath and wheezing. Negative for hemoptysis.   Cardiovascular: Negative for chest pain, palpitations, orthopnea, claudication, leg swelling and PND.  Gastrointestinal: Negative for abdominal pain, blood in stool, constipation, diarrhea, heartburn, melena, nausea and vomiting.  Genitourinary: Negative for dysuria, frequency, hematuria and urgency.  Musculoskeletal: Positive for back pain. Negative for falls, joint pain, myalgias and neck pain.  Skin: Negative for itching and rash.  Neurological: Negative for dizziness, tingling, tremors, sensory change, speech change, focal weakness, seizures, loss of consciousness, weakness and headaches.  Psychiatric/Behavioral: Negative for depression and memory loss. The patient has insomnia. The patient is not nervous/anxious.   DRUG ALLERGIES:   Allergies  Allergen Reactions  . Amoxicillin Other (See Comments)    DID THE REACTION INVOLVE: Swelling of the face/tongue/throat, SOB, or low BP? Unknown Sudden or severe rash/hives, skin peeling, or the inside of the mouth or nose? Unknown Did it require medical treatment? Unknown When did it last  happen?Unknown If all above answers are "NO", may proceed with cephalosporin use.   . Codeine Nausea And Vomiting  . Rapaflo [Silodosin] Other (See Comments)    Reaction:  Unknown   . Tramadol Nausea And Vomiting and Other (See Comments)    Pt states that this medication makes him feel crazy.    . Trimethoprim Other (See Comments)    Reaction: unknown   VITALS:  Blood pressure 104/84, pulse 82, temperature 98.1 F (36.7 C), temperature source Oral, resp. rate 18, height 5\' 6"  (1.676 m), weight 66.1 kg, SpO2 100 %. PHYSICAL EXAMINATION:  GENERAL: Chronically ill-appearing elderly male with no signs of acute distress. HEAD: Normocephalic, atraumatic. Marland Kitchen EYES: Pupils equal, round, reactive to light. Extraocular muscles intact. No scleral icterus.  MOUTH: Moist mucosal membranes.  EAR, NOSE, THROAT: Oropharynx clear.  NECK: Supple. No thyromegaly. No nodules. No JVD.  PULMONARY: Bibasilar coarse breath sounds. No wheezes. CARDIOVASCULAR: Normal S1 and S2. Regular rate and rhythm. No murmurs, rubs, or gallops. No pedal edema. Peripheral pulses 2+ bilaterally.  GASTROINTESTINAL: Soft, nontender, non-distended. Bowel sounds positive all four quadrants. No masses. MUSCULOSKELETAL: No swelling, clubbing, or edema. Range of motion full in all extremities. No edema.  NEUROLOGIC: Cranial nerves II through XII are intact. No gross focal neurological deficits. Sensation intact. Reflexes not checked. Gait not checked.  SKIN: No ulceration, lesions, rashes, or cyanosis. Skin warm and dry. PSYCHIATRIC: Mood, affect normal. The patient is awake, alert and oriented x 3. LABORATORY PANEL:  Male CBC Recent Labs  Lab 03/04/19 0733  WBC 11.0*  HGB 12.7*  HCT 38.9*  PLT 337   ------------------------------------------------------------------------------------------------------------------ Chemistries  Recent Labs  Lab 03/03/19 1800 03/04/19 0733  NA 135 139  K 4.2 4.7  CL 101 107  CO2 26  26   GLUCOSE 131* 132*  BUN 21 18  CREATININE 1.23 1.00  CALCIUM 9.1 9.0  MG 2.5*  --   AST 46*  --   ALT 23  --   ALKPHOS 72  --   BILITOT 0.4  --    RADIOLOGY:  Dg Chest 2 View  Result Date: 03/03/2019 CLINICAL DATA:  Shortness of breath. EXAM: CHEST - 2 VIEW COMPARISON:  March 02, 2019 FINDINGS: Peripheral reticular changes, particularly in the bases are stable consistent with known fibrosis. No acute infiltrate. The cardiomediastinal silhouette is normal. IMPRESSION: Known fibrosis.  No acute infiltrate. Electronically Signed   By: Dorise Bullion III M.D   On: 03/03/2019 18:17   ASSESSMENT AND PLAN:   A/P: 60M w/ PMHx COPD/emphysema (no home O2), UIP, OSA (CPAP intolerant), chronic diastolic CHF (EF 69% w/ grade I diastolic dysfxn as of 62/95/2841 Echo) p/w SOB, acute COPD exacerbation (w/o hypoxemic respiratory failure). Hyperglycemia, hypophosphatemia, hypermagnesemia, mild transaminasemia, leukocytosis, mild normocytic anemia.  -cough and dyspnea, acute COPD exacerbation:  Pt p/w 2d Hx SOB, improved in ED w/ treatment yesterday (Sat 03/02/2019).  Now returns to ED w/ recurrent symptoms.  Endorses improvement w/ nebs since returning.  Stable room air SpO2 at the time of my assessment. (-) active wheezing, (+) poor air movement. Not clinically volume overloaded. WBC 12.0, tachycardic, tachypneic, SIRS (+). Flu (-). CXR (-) infiltrate.  Afebrile, no obvious infxn. N ebs (Duo + Budesonide), steroids, incentive spirometry, pulmonary toileting, O2, pulse ox. Prior documentation w/ Hx UIP/pulmonary fibrosis, Echo to evaluate PA pressure. CPAP intolerant, may be worsening symptoms. -Seen by pulmonology today Dr. Lanney Gins who added meta-neb therapy for atelectasis - close follow-up with [facility] pulmonary on discharge - flu (-) and respiratory panel checked and in process with droplet precautions - lactate and procalcitonin are negative  -Hypophosphatemia: Phos 2.2. Replete and monitor.   -Hypermagnesemia, transaminasemia: Mag 2.5. AST 46. ALT WNL. Volume depleted. Gentle IVF.  -Normocytic anemia: Mild, stable. Low suspicion for active/acute bleeding at present.  -c/w other home meds/formulary subs as tolerated. -FEN/GI: Cardiac diet. -DVT PPx: Lovenox.  All the records are reviewed and case is discussed with Care Management/Social Worker. Management plans discussed with the patient and/or family and they are in agreement.  CODE STATUS: DNR  TOTAL TIME TAKING CARE OF THIS PATIENT: 30 minutes.   More than 50% of the time was spent in counseling/coordination of care: YES  POSSIBLE D/C IN 1-2 DAYS, DEPENDING ON CLINICAL CONDITION.   Latisa Belay PA-C on 03/04/2019 at 3:55 PM  Between 7am to 6pm - Pager - [phone]  After 6 pm go to [email or web address] - Proofreader  [facility]  Office  [phone]  CC: Primary care physician; Baxter Hire, MD  Note: This dictation was prepared with Dragon dictation along with smaller phrase technology. Any transcriptional errors that result from this process are unintentional.

## 2019-03-04 NOTE — Care Management Obs Status (Signed)
[place] NOTIFICATION   Patient Details  Name: Wesley Newton MRN: [id number] Date of Birth: [birth year]   Medicare Observation Status Notification Given:  Yes    Elza Rafter, RN 03/04/2019, 4:21 PM

## 2019-03-04 NOTE — Progress Notes (Signed)
*  PRELIMINARY RESULTS* Echocardiogram 2D Echocardiogram has been performed.  Wesley Newton Andrae Claunch 03/04/2019, 2:01 PM

## 2019-03-04 NOTE — TOC Initial Note (Signed)
Transition of Care [facility]) - Initial/Assessment Note    Patient Details  Name: Wesley Newton MRN: [id number] Date of Birth: May 25, 1927  [facility] of [facility]) CM/SW Contact:    Wesley Rafter, RN Phone Number: 03/04/2019, 10:59 AM  Clinical Narrative:       Patient is from home alone.  Lives at [place].  He does not use any assistive devices.  No oxygen requirements.  He has an extensive pulmonary hx. Including COPD, emphysemia, pulmonary fibrosis.  He was admitted with COPD exacerbation.  Currently on room air.  RNCM consult placed for Medication needs.  Patient states he can afford his medications but he uses 3 different pharmacies [facility], [facility] [address] and [facility].  He states one of his nebs [facility] only be obtained at [facility].  I called [facility] as he feels a special connection with them and would like to primarily use them.  He states it's getting hard to drive that far.  With permission from patient, I called [facility] asking if they could deliver medications as he would like to keep them as primary pharmacy.  They said they can deliver his medications from now on.  They are unsure of the issue with the nebulizer medication.  They feel they can provide all medications.  Offered home health services.  He would like to use [facility] for RN, PT, aide.  Referral made to [facility] with [facility] and accepted.  Will notify Wesley Newton when patient discharges.             Expected Discharge Plan: [place]     Patient Goals and CMS Choice        Expected Discharge Plan and Services Expected Discharge Plan: [place] Arranged: RN, PT, Nurse's Aide [facility] Agency: [place] ([facility])  Prior Living Arrangements/Services     Patient language and need for interpreter reviewed:: Yes Do you feel safe going back to the place where you live?: Yes        Care giver support system in  place?: No (comment)(Lives alone)   Criminal Activity/Legal Involvement Pertinent to Current Situation/Hospitalization: No - Comment as needed  Activities of Daily Living Home Assistive Devices/Equipment: Eyeglasses, Grab bars in shower ADL Screening (condition at time of admission) Patient's cognitive ability adequate to safely complete daily activities?: Yes Is the patient deaf or have difficulty hearing?: Yes Does the patient have difficulty seeing, even when wearing glasses/contacts?: Yes Does the patient have difficulty concentrating, remembering, or making decisions?: No Patient able to express need for assistance with ADLs?: Yes Does the patient have difficulty dressing or bathing?: No Independently performs ADLs?: Yes (appropriate for developmental age) Does the patient have difficulty walking or climbing stairs?: Yes Weakness of Legs: Both Weakness of Arms/Hands: Both  Permission Sought/Granted Permission sought to share information with : Facility Art therapist granted to share information with : Yes, Verbal Permission Granted  Share Information with NAME: Wesley Newton with [facility]           Emotional Assessment Appearance:: Appears stated age Attitude/Demeanor/Rapport: Gracious Affect (typically observed): Accepting Orientation: : Oriented to Self, Oriented to Place, Oriented to  Time, Oriented to Situation Alcohol / Substance Use: Not Applicable    Admission diagnosis:  COPD exacerbation (HCC) [J44.1] Subcutaneous  emphysema resulting from a procedure, initial encounter [T81.82XA] Patient Active Problem List   Diagnosis Date Noted  . Acute exacerbation of chronic obstructive pulmonary disease (COPD) ([place]) 03/03/2019  . Malnutrition of moderate degree 12/21/2018  . Influenza B 12/19/2018  . CAP (community acquired pneumonia) 12/19/2018  . PAD (peripheral artery disease) ([place]) 04/25/2017  . BPH (benign prostatic hyperplasia) 01/10/2016  .  Nocturia 01/10/2016  . Benign prostatic hyperplasia with urinary obstruction 01/10/2016  . AB (asthmatic bronchitis) 12/17/2014  . Carotid artery disease ([place]) 12/04/2014  . GERD (gastroesophageal reflux disease) 12/04/2014  . H/O respiratory system disease 12/04/2014  . H/O peptic ulcer 12/04/2014  . Hypercholesterolemia 12/04/2014  . Unstable angina pectoris ([place]) 12/04/2014  . Amnesia 12/04/2014  . Chronic obstructive pulmonary disease ([place]) 06/11/2014  . Cephalalgia 06/11/2014  . HTN (hypertension) 06/11/2014   PCP:  Wesley Hire, MD Pharmacy:   [facility], [place]. [place] [address] Phone: [phone] Fax: [phone]  [id number] - [place], [place] AT [place] [address] [place] [address] Phone: [phone] Fax: [phone]     Social Determinants of Health ([place]) Interventions    Readmission Risk Interventions  No flowsheet data found.

## 2019-03-04 NOTE — Consult Note (Signed)
Pulmonary Medicine          Date: 03/04/2019,   MRN# [id number] Wesley Newton 05-16-1927     AdmissionWeight: 66.1 kg                 CurrentWeight: 66.1 kg      CHIEF COMPLAINT:   Worsening cough with dyspnea refractory to inhalers.   HISTORY OF PRESENT ILLNESS   This is a pleasant 85 year old male with a history of COPD/asthma overlap and pulmonary fibrosis well-known to our service seen by Dr. Raul Del.  Also has a background history of skin cancer as well as BPH and DVT.  Was asked to see the patient for cough, dyspnea refractory to inhalers 5 times daily as well as low-grade fevers over the past week.  Patient states that he had a cough that is chronic over the last 5 years however recently has become worse, he currently lives in [facility] assisted living and has asked nurse to measure temperature which was 100.3 T-max.  He denies sick contacts or travel.  He denies constitutional symptoms, denies chest pain, nausea vomiting diarrhea.  He had x-ray done on admission which was essentially unremarkable from previous with chronic interstitial lung disease.   PAST MEDICAL HISTORY   Past Medical History:  Diagnosis Date   Asthma    BPH (benign prostatic hyperplasia)    Bronchitis    Cancer (HCC)    COPD (chronic obstructive pulmonary disease) (HCC)    DVT of leg (deep venous thrombosis) (HCC)    Elevated PSA    Emphysema lung (HCC)    Emphysema of lung (HCC)    Hyperlipidemia    Nocturia    Pulmonary fibrosis (HCC)    Pulmonary fibrosis (HCC)    Skin cancer      SURGICAL HISTORY   Past Surgical History:  Procedure Laterality Date   CATARACT EXTRACTION     HERNIA REPAIR     TONSILLECTOMY       FAMILY HISTORY   Family History  Problem Relation Age of Onset   Heart Problems Brother    Kidney disease Neg Hx    Prostate cancer Neg Hx      SOCIAL HISTORY   Social History   Tobacco Use   Smoking status: Former Smoker     Smokeless tobacco: Never Used   Tobacco comment: quit 40 years  Substance Use Topics   Alcohol use: No    Alcohol/week: 0.0 standard drinks   Drug use: No     MEDICATIONS    Home Medication:    Current Medication:  Current Facility-Administered Medications:    acetaminophen (TYLENOL) tablet 650 mg, 650 mg, Oral, Q6H PRN **OR** acetaminophen (TYLENOL) suppository 650 mg, 650 mg, Rectal, Q6H PRN, Jodell Cipro, Prasanna, MD   aspirin EC tablet 81 mg, 81 mg, Oral, Daily, Sridharan, Prasanna, MD, 81 mg at 03/04/19 1121   azithromycin (ZITHROMAX) tablet 250 mg, 250 mg, Oral, Daily, Manuella Ghazi, Vipul, MD, 250 mg at 03/04/19 1321   benzonatate (TESSALON) capsule 100 mg, 100 mg, Oral, TID PRN, Arta Silence, MD   bisacodyl (DULCOLAX) EC tablet 5 mg, 5 mg, Oral, Daily PRN, Jodell Cipro, Prasanna, MD   budesonide (PULMICORT) nebulizer solution 0.5 mg, 0.5 mg, Nebulization, Daily, Jodell Cipro, Prasanna, MD, 0.5 mg at 03/04/19 0824   Chlorhexidine Gluconate Cloth 2 % PADS 6 each, 6 each, Topical, Q0600, Max Sane, MD, 6 each at 03/04/19 0642   enoxaparin (LOVENOX) injection 40 mg, 40 mg, Subcutaneous, Q24H,  Arta Silence, MD, 40 mg at 03/03/19 2341   feeding supplement (ENSURE ENLIVE) (ENSURE ENLIVE) liquid 237 mL, 237 mL, Oral, BID BM, Arta Silence, MD   finasteride (PROSCAR) tablet 5 mg, 5 mg, Oral, Daily, Jodell Cipro, Prasanna, MD, 5 mg at 03/04/19 1121   gabapentin (NEURONTIN) capsule 200 mg, 200 mg, Oral, BID, Jodell Cipro, Prasanna, MD, 200 mg at 03/04/19 1122   ipratropium-albuterol (DUONEB) 0.5-2.5 (3) MG/3ML nebulizer solution 3 mL, 3 mL, Nebulization, Q6H, Manuella Ghazi, Vipul, MD   lidocaine (LIDODERM) 5 % 1 patch, 1 patch, Transdermal, Q24H, Jodell Cipro, Prasanna, MD, 1 patch at 03/04/19 0014   LORazepam (ATIVAN) tablet 0.5 mg, 0.5 mg, Oral, Q8H PRN, Arta Silence, MD, 0.5 mg at 03/03/19 2343   methylPREDNISolone sodium succinate (SOLU-MEDROL) 125 mg/2 mL injection 60  mg, 60 mg, Intravenous, Q12H, 60 mg at 03/04/19 1122 **FOLLOWED BY** [START ON 03/05/2019] predniSONE (DELTASONE) tablet 40 mg, 40 mg, Oral, Q breakfast, Jodell Cipro, Prasanna, MD   multivitamin with minerals tablet 1 tablet, 1 tablet, Oral, Daily, Arta Silence, MD, 1 tablet at 03/04/19 1121   mupirocin ointment (BACTROBAN) 2 % 1 application, 1 application, Nasal, BID, Max Sane, MD, 1 application at 41/74/08 1120   naphazoline-glycerin (CLEAR EYES REDNESS) ophth solution 1-2 drop, 1-2 drop, Both Eyes, QID PRN, Jodell Cipro, Prasanna, MD   ondansetron (ZOFRAN) tablet 4 mg, 4 mg, Oral, Q6H PRN **OR** ondansetron (ZOFRAN) injection 4 mg, 4 mg, Intravenous, Q6H PRN, Jodell Cipro, Prasanna, MD   pantoprazole (PROTONIX) EC tablet 40 mg, 40 mg, Oral, Daily, Sridharan, Prasanna, MD, 40 mg at 03/04/19 1121   phenol (CHLORASEPTIC) mouth spray 1 spray, 1 spray, Mouth/Throat, PRN, Jodell Cipro, Prasanna, MD   senna-docusate (Senokot-S) tablet 1 tablet, 1 tablet, Oral, QHS PRN, Jodell Cipro, Prasanna, MD   simvastatin (ZOCOR) tablet 40 mg, 40 mg, Oral, QHS, Sridharan, Prasanna, MD, 40 mg at 03/03/19 2342   sodium chloride flush (NS) 0.9 % injection 10 mL, 10 mL, Intravenous, Q12H, Manuella Ghazi, Vipul, MD, 10 mL at 03/04/19 1123   tamsulosin (FLOMAX) capsule 0.4 mg, 0.4 mg, Oral, Daily, Sridharan, Prasanna, MD, 0.4 mg at 03/04/19 1121    ALLERGIES   Amoxicillin; Codeine; Rapaflo [silodosin]; Tramadol; and Trimethoprim     REVIEW OF SYSTEMS    Review of Systems:  Gen:  Denies  fever, sweats, chills weigh loss  HEENT: Denies blurred vision, double vision, ear pain, eye pain, hearing loss, nose bleeds, sore throat Cardiac:  No dizziness, chest pain or heaviness, chest tightness,edema Resp:   Denies cough or sputum porduction, shortness of breath,wheezing, hemoptysis,  Gi: Denies swallowing difficulty, stomach pain, nausea or vomiting, diarrhea, constipation, bowel incontinence Gu:  Denies bladder  incontinence, burning urine Ext:   Denies Joint pain, stiffness or swelling Skin: Denies  skin rash, easy bruising or bleeding or hives Endoc:  Denies polyuria, polydipsia , polyphagia or weight change Psych:   Denies depression, insomnia or hallucinations   Other:  All other systems negative   VS: BP 104/84 (BP Location: Right Arm)    Pulse 82    Temp 36.7 C (Oral)    Resp 18    Ht 5\' 6"  (1.676 m)    Wt 66.1 kg    SpO2 100%    BMI 23.52 kg/m      PHYSICAL EXAM    GENERAL:NAD, no fevers, chills, no weakness no fatigue HEAD: Normocephalic, atraumatic.  EYES: Pupils equal, round, reactive to light. Extraocular muscles intact. No scleral icterus.  MOUTH: Moist mucosal membrane. Dentition intact. No abscess noted.  EAR, NOSE, THROAT: Clear without exudates. No external lesions.  NECK: Supple. No thyromegaly. No nodules. No JVD.  PULMONARY: mild velcro crepitations at bases CARDIOVASCULAR: S1 and S2. Regular rate and rhythm. No murmurs, rubs, or gallops. No edema. Pedal pulses 2+ bilaterally.  GASTROINTESTINAL: Soft, nontender, nondistended. No masses. Positive bowel sounds. No hepatosplenomegaly.  MUSCULOSKELETAL: No swelling, clubbing, or edema. Range of motion full in all extremities.  NEUROLOGIC: Cranial nerves II through XII are intact. No gross focal neurological deficits. Sensation intact. Reflexes intact.  SKIN: No ulceration, lesions, rashes, or cyanosis. Skin warm and dry. Turgor intact.  PSYCHIATRIC: Mood, affect within normal limits. The patient is awake, alert and oriented x 3. Insight, judgment intact.       IMAGING    Dg Chest 2 View  Result Date: 03/03/2019 CLINICAL DATA:  Shortness of breath. EXAM: CHEST - 2 VIEW COMPARISON:  March 02, 2019 FINDINGS: Peripheral reticular changes, particularly in the bases are stable consistent with known fibrosis. No acute infiltrate. The cardiomediastinal silhouette is normal. IMPRESSION: Known fibrosis.  No acute infiltrate.  Electronically Signed   By: Dorise Bullion III M.D   On: 03/03/2019 18:17   Dg Chest 2 View  Result Date: 03/02/2019 CLINICAL DATA:  Acute cough. History of COPD and pulmonary fibrosis. EXAM: CHEST - 2 VIEW COMPARISON:  12/18/2018 chest radiograph, 02/05/2019 chest CT and prior studies FINDINGS: The cardiomediastinal silhouette is unremarkable. Bilateral interstitial lung again noted. No definite new pulmonary opacities are airspace opacities noted. No pleural effusion or pneumothorax. No acute bony abnormalities are identified. IMPRESSION: Chronic interstitial lung disease.  No definite acute abnormality. Electronically Signed   By: Margarette Canada M.D.   On: 03/02/2019 08:22   Ct Chest High Resolution  Result Date: 02/05/2019 CLINICAL DATA:  85 year old male with history of increasing shortness of breath. History of emphysema. Asthma. Cough for the past 5 years. EXAM: CT CHEST WITHOUT CONTRAST TECHNIQUE: Multidetector CT imaging of the chest was performed following the standard protocol without intravenous contrast. High resolution imaging of the lungs, as well as inspiratory and expiratory imaging, was performed. COMPARISON:  High-resolution chest CT 11/23/2017. FINDINGS: Cardiovascular: Heart size is normal. There is no significant pericardial fluid, thickening or pericardial calcification. There is aortic atherosclerosis, as well as atherosclerosis of the great vessels of the mediastinum and the coronary arteries, including calcified atherosclerotic plaque in the left main, left anterior descending, left circumflex and right coronary arteries. Calcifications of the aortic valve. Mediastinum/Nodes: No pathologically enlarged mediastinal or hilar lymph nodes. Please note that accurate exclusion of hilar adenopathy is limited on noncontrast CT scans. Esophagus is unremarkable in appearance. No axillary lymphadenopathy. Lungs/Pleura: High-resolution images again demonstrate widespread areas of septal  thickening, subpleural reticulation, parenchymal banding, traction bronchiectasis and frank honeycombing. These findings appear mildly progressive compared to prior study from 11/23/2017, and there is a definitive craniocaudal gradient. Inspiratory and expiratory imaging is unremarkable. No acute consolidative airspace disease. No pleural effusions. No definite suspicious appearing pulmonary nodules or masses are noted. Upper Abdomen: Aortic atherosclerosis. Musculoskeletal: There are no aggressive appearing lytic or blastic lesions noted in the visualized portions of the skeleton. IMPRESSION: 1. Progressively worsening interstitial lung disease with imaging findings considered compatible with usual interstitial pneumonia (UIP) per current ATS guidelines. 2. Aortic atherosclerosis, in addition to left main and 3 vessel coronary artery disease. 3. There are calcifications of the aortic valve. Echocardiographic correlation for evaluation of potential valvular dysfunction may be warranted if clinically indicated. Aortic Atherosclerosis (ICD10-I70.0). Electronically Signed  By: Vinnie Langton M.D.   On: 02/05/2019 15:55      ASSESSMENT/PLAN    Cough and dyspnea refractory to home inhalers   -Likely due to acute COPD/IPF exacerbation   -Agree with current COPD care path including duo nebs ,Pulmicort  -Continue IV Solu-Medrol, Zithromax   -will add Meta-neb therapy for atelectatasis  -will need close follow-up on outpatient-have notified [facility] pulmonary nursing staff to make priority appointment for clinic     Idiopathic pulmonary fibrosis     - will discuss additional options in clinic      Thank you for allowing me to participate in the care of this patient.  This document was prepared using Dragon voice recognition software and may include unintentional dictation errors.     Ottie Glazier, M.D.  Division of [facility] [place]

## 2019-03-05 DIAGNOSIS — J84112 Idiopathic pulmonary fibrosis: Secondary | ICD-10-CM

## 2019-03-05 LAB — BASIC METABOLIC PANEL
Anion gap: 8 (ref 5–15)
BUN: 24 mg/dL — ABNORMAL HIGH (ref 8–23)
CHLORIDE: 106 mmol/L (ref 98–111)
CO2: 24 mmol/L (ref 22–32)
Calcium: 9.1 mg/dL (ref 8.9–10.3)
Creatinine, Ser: 1 mg/dL (ref 0.61–1.24)
GFR calc Af Amer: >60 mL/min (ref >60)
GFR calc non Af Amer: >60 mL/min (ref >60)
Glucose, Bld: 153 mg/dL — ABNORMAL HIGH (ref 70–99)
Potassium: 4.3 mmol/L (ref 3.5–5.1)
Sodium: 138 mmol/L (ref 135–145)

## 2019-03-05 LAB — CBC
HEMATOCRIT: 37.1 % — AB (ref 39.0–52.0)
Hemoglobin: 12.1 g/dL — ABNORMAL LOW (ref 13.0–17.0)
MCH: 27.3 pg (ref 26.0–34.0)
MCHC: 32.6 g/dL (ref 30.0–36.0)
MCV: 83.7 fL (ref 80.0–100.0)
Platelets: 321 10*3/uL (ref 150–400)
RBC: 4.43 MIL/uL (ref 4.22–5.81)
RDW: 15.3 % (ref 11.5–15.5)
WBC: 11.4 10*3/uL — ABNORMAL HIGH (ref 4.0–10.5)
nRBC: 0 % (ref 0.0–0.2)

## 2019-03-05 LAB — PHOSPHORUS: Phosphorus: 3.3 mg/dL (ref 2.5–4.6)

## 2019-03-05 MED ORDER — PREDNISONE 10 MG (21) PO TBPK: ORAL_TABLET | ORAL | 0 refills | Status: DC

## 2019-03-05 NOTE — Discharge Instructions (Signed)
Take prednisone in a taper in this manner to help manage inflammation from your chronic lung disease: Take 4 tabs (40 mg) day 1, 3 tabs (30 mg) day 2, 2 tabs (20 mg) day 3, 1 tab (10 mg) day 4. Then stop.   Then please follow up with your primary doctor and lung specialist at Dr. Joanell Rising office as scheduled.  We will make these appointments for you.

## 2019-03-05 NOTE — TOC Transition Note (Signed)
Transition of Care [facility]) - CM/SW Discharge Note   Patient Details  Name: DERRIEN ANSCHUTZ MRN: [id number] Date of Birth: [birth year]  Transition of Care [facility]) CM/SW Contact:  Beverly Sessions, RN Phone Number: 03/05/2019, 2:17 PM   Clinical Narrative:    Patient to discharge home today.  [name] with [facility] notified of discharge   Final next level of care: [place] Barriers to Discharge: Barriers Resolved   Patient Goals and CMS Choice        Discharge Placement                       Discharge Plan and Services                  HH Arranged: RN, PT, OT, Nurse's Aide, Social Work [employer] Agency: [facility]   Social Determinants of Health (SDOH) Interventions     Readmission Risk Interventions No flowsheet data found.

## 2019-03-05 NOTE — Plan of Care (Signed)
  Problem: Education: Goal: Knowledge of General Education information will improve Description Including pain rating scale, medication(s)/side effects and non-pharmacologic comfort measures Outcome: Progressing   Problem: Health Behavior/Discharge Planning: Goal: Ability to manage health-related needs will improve Outcome: Progressing   Problem: Clinical Measurements: Goal: Ability to maintain clinical measurements within normal limits will improve Outcome: Progressing Goal: Will remain free from infection Outcome: Progressing Goal: Diagnostic test results will improve Outcome: Progressing Goal: Respiratory complications will improve Outcome: Progressing Goal: Cardiovascular complication will be avoided Outcome: Progressing   Problem: Activity: Goal: Risk for activity intolerance will decrease Outcome: Progressing   Problem: Safety: Goal: Ability to remain free from injury will improve Outcome: Progressing   Problem: Skin Integrity: Goal: Risk for impaired skin integrity will decrease Outcome: Progressing   

## 2019-03-05 NOTE — Discharge Summary (Signed)
[facility] NAME: Wesley Newton    MR#:  [id number]  DATE OF BIRTH:  August 15, 1927  DATE OF ADMISSION:  03/03/2019   ADMITTING PHYSICIAN: Arta Silence, MD  DATE OF DISCHARGE: 03/05/2019  2:29 PM  PRIMARY CARE PHYSICIAN: Baxter Hire, MD   ADMISSION DIAGNOSIS:  COPD exacerbation ([place]) [J44.1] Subcutaneous emphysema resulting from a procedure, initial encounter [T81.82XA] DISCHARGE DIAGNOSIS:  Active Problems:   Chronic obstructive pulmonary disease ([place])   Idiopathic pulmonary fibrosis ([place])  SECONDARY DIAGNOSIS:   Past Medical History:  Diagnosis Date  . Asthma   . BPH (benign prostatic hyperplasia)   . Bronchitis   . Cancer ([place])   . COPD (chronic obstructive pulmonary disease) ([place])   . DVT of leg (deep venous thrombosis) ([place])   . Elevated PSA   . Emphysema lung ([place])   . Emphysema of lung ([place])   . Hyperlipidemia   . Nocturia   . Pulmonary fibrosis ([place])   . Pulmonary fibrosis ([place])   . Skin cancer    HOSPITAL COURSE:   85 year old male with a history of COPD/asthma overlap not on home oxygen, pulmonary fibrosis seen by Dr. Raul Del outpatient pulmonology (last visit 02/18/2019), BPH, OSA (CPAP intolerant) DVT, CHF (EF 60-65% 04/13/8340 grade 1 diastolic dysfunction, LVH) follows with Dr. Clayborn Bigness) and skin cancer.   He states he was in the [facility] ED Sat 03/02/2019 due to SOB. He states he received nebulizer treatments, felt better and went home. He states he felt reasonably well after arriving home, Sunday 03/03/2019 noticed he was feeling short of breath again with wheezing, no chest pain. Patient presented to the ED again with cough and dyspnea refractory to inhalers 5 times daily as well as low-grade fevers (Tmax 100.3) over the past week. He has had a cough that is chronic over the last 5 years however recently has become worse. He is a resident of [facility] assisted [facility]. He denies sick contacts or  travel. He had x-ray done on admission which was essentially unchanged from previous with chronic interstitial lung disease.  Acute COPD/Idiopathic pulmonary fibrosis exacerbation With cough and dyspnea refractory to inhalers. Respiratory panel negative. Influenza negative. Procalcitonin <0.10 low suspicion for bacterial infection. Lactate WNL.  COPD care path including duonebs, inhalers, IV Solu-Medrol, Zithromax. Meta-neb while admitted for atelectasis. On discharge continue Prednisone 40 mg taper. Close follow-up with [facility] pulmonology scheduled. Patient stable on the day of discharge but still residual dyspnea and cough. He was afebrile with stable WBC (was on steroids), breathing on RA. Given risk for viral infection in the hospital, discharged home to manage risk.  [place] services arranged 2 week protocol. COPD protocol requested with CM.  Outpatient palliative care requested with CM as overall prognosis with chronic lung conditions seems poor. [place] texted after hours to help with setting this up/confirm.  OSA/CPAP intolerant Echo completed 03/03/19 RA pressure 10 mm Hg. OSA contribution to dyspnea suspected by admitting MD on admission/PHTN due to patient's CPAP intolerance. Outpatient follow-up with Cardiology as above.  CHF  No signs of fluid overload/exacerbation this admission, echo completed. Follow-up as scheduled PCP and cardiology.  Normocytic Anemia Stable around 12.1. No active bleeding. PCP follow-up as above  Electrolyte Abnormalities  Potassium, phosphorus repleted. Magnesium slightly elevated to 2.5.   DISCHARGE CONDITIONS:  fair CONSULTS OBTAINED:  Treatment Team:  Arta Silence, MD Ottie Glazier, MD DRUG ALLERGIES:   Allergies  Allergen Reactions  . Amoxicillin  Other (See Comments)    DID THE REACTION INVOLVE: Swelling of the face/tongue/throat, SOB, or low BP? Unknown Sudden or severe rash/hives, skin peeling, or the inside of the mouth or nose?  Unknown Did it require medical treatment? Unknown When did it last happen?Unknown If all above answers are "NO", may proceed with cephalosporin use.   . Codeine Nausea And Vomiting  . Rapaflo [Silodosin] Other (See Comments)    Reaction:  Unknown   . Tramadol Nausea And Vomiting and Other (See Comments)    Pt states that this medication makes him feel crazy.    . Trimethoprim Other (See Comments)    Reaction: unknown   DISCHARGE MEDICATIONS:   Allergies as of 03/05/2019      Reactions   Amoxicillin Other (See Comments)   DID THE REACTION INVOLVE: Swelling of the face/tongue/throat, SOB, or low BP? Unknown Sudden or severe rash/hives, skin peeling, or the inside of the mouth or nose? Unknown Did it require medical treatment? Unknown When did it last happen?Unknown If all above answers are "NO", may proceed with cephalosporin use.   Codeine Nausea And Vomiting   Rapaflo [silodosin] Other (See Comments)   Reaction:  Unknown    Tramadol Nausea And Vomiting, Other (See Comments)   Pt states that this medication makes him feel crazy.     Trimethoprim Other (See Comments)   Reaction: unknown      Medication List    STOP taking these medications   isosorbide mononitrate 30 MG 24 hr tablet Commonly known as:  IMDUR   MAGNESIUM PO   methylPREDNISolone sodium succinate 125 mg/2 mL injection Commonly known as:  SOLU-MEDROL   naproxen 375 MG tablet Commonly known as:  NAPROSYN   omeprazole 40 MG capsule Commonly known as:  PRILOSEC   predniSONE 20 MG tablet Commonly known as:  Deltasone Replaced by:  predniSONE 10 MG (21) Tbpk tablet   PRESERVISION AREDS 2 PO     TAKE these medications   albuterol 108 (90 Base) MCG/ACT inhaler Commonly known as:  PROVENTIL HFA;VENTOLIN HFA Inhale 2 puffs into the lungs every 6 (six) hours as needed for wheezing or shortness of breath.   albuterol (2.5 MG/3ML) 0.083% nebulizer solution Commonly known as:  PROVENTIL Take 2.5 mg by  nebulization every 6 (six) hours as needed for wheezing or shortness of breath.   aspirin EC 81 MG tablet Take 81 mg by mouth daily.   azelastine 0.1 % nasal spray Commonly known as:  ASTELIN Place 1 spray into both nostrils daily.   azithromycin 250 MG tablet Commonly known as:  Zithromax Z-Pak Take 2 tablets (500 mg) on  Day 1,  followed by 1 tablet (250 mg) once daily on Days 2 through 5.   benzonatate 100 MG capsule Commonly known as:  TESSALON Take 1 capsule (100 mg total) by mouth 3 (three) times daily as needed for cough.   budesonide 0.5 MG/2ML nebulizer solution Commonly known as:  PULMICORT Take 0.5 mg by nebulization daily.   enoxaparin 40 MG/0.4ML injection Commonly known as:  LOVENOX Inject 40 mg into the skin daily.   feeding supplement (ENSURE ENLIVE) Liqd Take 237 mLs by mouth 2 (two) times daily between meals.   finasteride 5 MG tablet Commonly known as:  PROSCAR Take 1 tablet (5 mg total) by mouth daily.   gabapentin 100 MG capsule Commonly known as:  NEURONTIN Take 100-200 mg by mouth 2 (two) times daily.   ipratropium-albuterol 0.5-2.5 (3) MG/3ML Soln Commonly known  as:  DUONEB Inhale 3 mLs into the lungs 4 (four) times daily.   lidocaine 5 % Commonly known as:  LIDODERM Place 1 patch onto the skin daily. Remove & Discard patch within 12 hours or as directed by MD   LORazepam 0.5 MG tablet Commonly known as:  ATIVAN Take 0.5 mg by mouth every 8 (eight) hours as needed.   multivitamin with minerals Tabs tablet Take 1 tablet by mouth daily.   mupirocin ointment 2 % Commonly known as:  BACTROBAN Place 1 application into the nose 2 (two) times daily.   naphazoline-glycerin 0.012-0.2 % Soln Commonly known as:  CLEAR EYES REDNESS Place 1-2 drops into both eyes 4 (four) times daily as needed for eye irritation.   pantoprazole 40 MG tablet Commonly known as:  PROTONIX Take 40 mg by mouth daily.   predniSONE 10 MG (21) Tbpk tablet Commonly  known as:  STERAPRED UNI-PAK 21 TAB Take 4 tabs (40 mg) day 1, 3 tabs (30 mg) day 2, 2 tabs (20 mg) day 3, 1 tab (10 mg) day 4. Replaces:  predniSONE 20 MG tablet   simvastatin 40 MG tablet Commonly known as:  ZOCOR Take 40 mg by mouth at bedtime.   tamsulosin 0.4 MG Caps capsule Commonly known as:  FLOMAX Take 1 capsule (0.4 mg total) by mouth daily.        DISCHARGE INSTRUCTIONS:   DIET:  Cardiac diet DISCHARGE CONDITION:  Fair, would benefit from outpatient palliative care ACTIVITY:  Activity as tolerated OXYGEN:  Home Oxygen: No.  Oxygen Delivery: room air DISCHARGE LOCATION:  Home with HH PT, OT, RN, aide, SW, COPD protocol, 2 week HH protocol, outpatient palliative care. Communicated with CM. Close pulmonology follow-up scheduled.  If you experience worsening of your admission symptoms, develop shortness of breath, life threatening emergency, suicidal or homicidal thoughts you must seek medical attention immediately by calling 911 or calling your MD immediately if your symptoms are severe.  You Must read complete instructions/literature along with all the possible adverse reactions/side effects for all the medicines you take and that have been prescribed to you. Take any new medicines only after you have completely understood and accept all the possible adverse reactions/side effects.   Please note  You were cared for by a hospitalist during your hospital stay. If you have any questions about your discharge medications or the care you received while you were in the hospital after you are discharged, you can call the unit and asked to speak with the hospitalist on call if the hospitalist that took care of you is not available. Once you are discharged, your primary care physician will handle any further medical issues. Please note that NO REFILLS for any discharge medications will be authorized once you are discharged, as it is imperative that you return to your primary care  physician (or establish a relationship with a primary care physician if you do not have one) for your aftercare needs so that they can reassess your need for medications and monitor your lab values.    On the day of Discharge:  VITAL SIGNS:  Blood pressure 133/72, pulse 79, temperature 98.5 F (36.9 C), temperature source Oral, resp. rate 20, height 5\' 6"  (1.676 m), weight 65.1 kg, SpO2 96 %. PHYSICAL EXAMINATION:  GENERAL: Chronically ill-appearing elderly male with no signs of acute distress. HEAD: Normocephalic, atraumatic EYES: Pupils equal, round, reactive to light. Extraocular muscles intact. No scleral icterus.  MOUTH: Moist mucosal membranes.  EAR, NOSE, THROAT:  Oropharynx clear.  NECK: Supple. No thyromegaly. No nodules. No JVD.  PULMONARY: Bibasilar coarse breath sounds. No wheezes. CARDIOVASCULAR: Normal S1 and S2. Regular rate and rhythm. No murmurs, rubs, or gallops. No pedal edema. Peripheral pulses 2+ bilaterally.  GASTROINTESTINAL: Soft, nontender, non-distended. Bowel sounds positive all four quadrants. No masses. MUSCULOSKELETAL: No swelling, clubbing, or edema. Range of motion full in all extremities. No edema.  NEUROLOGIC: Cranial nerves II through XII are intact. No gross focal neurological deficits. Sensation intact. Reflexes not checked. Gait not checked.  SKIN: No ulceration, lesions, rashes, or cyanosis. Skin warm and dry. PSYCHIATRIC: Mood, affect normal. The patient is awake, alert and oriented x 3. DATA REVIEW:   CBC Recent Labs  Lab 03/05/19 0430  WBC 11.4*  HGB 12.1*  HCT 37.1*  PLT 321    Chemistries  Recent Labs  Lab 03/03/19 1800  03/05/19 0430  NA 135   < > 138  K 4.2   < > 4.3  CL 101   < > 106  CO2 26   < > 24  GLUCOSE 131*   < > 153*  BUN 21   < > 24*  CREATININE 1.23   < > 1.00  CALCIUM 9.1   < > 9.1  MG 2.5*  --   --   AST 46*  --   --   ALT 23  --   --   ALKPHOS 72  --   --   BILITOT 0.4  --   --    < > = values in this  interval not displayed.    RADIOLOGY:  No results found.   Management plans discussed with the patient and/or family and they are in agreement.  CODE STATUS: Prior   TOTAL TIME TAKING CARE OF THIS PATIENT: 45 minutes.    Tamyka Bezio PA-C on 03/05/2019 at 7:30 PM  Between 7am to 6pm - Pager - [phone]  After 6pm go to [email or web address] - Proofreader  [facility]  Office  [phone]  CC: Primary care physician; Baxter Hire, MD

## 2019-03-08 LAB — CULTURE, BLOOD (ROUTINE X 2)
CULTURE: NO GROWTH
Culture: NO GROWTH

## 2019-03-13 ENCOUNTER — Other Ambulatory Visit: Payer: Self-pay

## 2019-03-13 ENCOUNTER — Other Ambulatory Visit: Payer: Medicare Other | Admitting: Student

## 2019-03-13 DIAGNOSIS — Z515 Encounter for palliative care: Secondary | ICD-10-CM

## 2019-03-13 NOTE — Progress Notes (Signed)
[facility] Consult Note Telephone: [phone]  Fax: [phone]  PATIENT NAME: Wesley Newton DOB: March 05, 1927 MRN: [id number]  PRIMARY CARE PROVIDER:   Baxter Hire, MD  REFERRING PROVIDER:  Baxter Hire, MD [place], [place] [address]  RESPONSIBLE PARTY: Granddaughter Audra-HCPOA    ASSESSMENT: Wesley Newton is resting on sofa with feet elevated upon arrival. He welcomes visit. No acute distress noted; at times during conversation, he does appear to be short of breath. He is alert and oriented x 3. Wesley Newton provided by medical and life history review. We discussed role of Palliative Medicine. We discussed goals of care; he would like to try therapy with the hopes of regaining strength and being able to walk on campus again. We discussed symptom management; we discussed recommendations for his dyspnea as well as he reports pain from mild to severe at times. We discussed energy conservation and resources that are available on [employer]. NP spoke with care coordinator Porscha at [facility]; she is made aware of patient needs; she will go out and do an assessment for resources in the home. Palliative Medicine will follow up in two weeks.        RECOMMENDATIONS and PLAN:  1. Code Status: DNR. 2. Medical Goals of Therapy: Continue [facility] OT/PT and SN services through [facility]. Palliative Medicine will monitor for changes and declines. 3. Symptom management: Dyspnea- Recommendations: Dyspnea-start Morphine 20mg /ml take 0.28ml (5mg ) PO every 4 hours prn shortness of breath, may also take for severe pain. Continue nebulizers, inhaler as directed. He is also encouraged to take his lorazepam every 8 hours prn for anxiety, which can also help with his breathing.  Pain-mild pain-continue acetaminophen 650mg  every 4 hours prn. Constipation-start senna-S one tablet PO BID. 4. Discharge Planning: he will continue to  res[facility] at [facility]. 5. Discussed with Wesley Newton and care coordinator Porscha; they are encouraged to call with questions.    Palliative Medicine will follow up in 2 weeks or sooner, if needed.   I spent 60 minutes providing this consultation,  from 1:00pm to 2:00pm. More than 50% of the time in this consultation was spent coordinating communication.   HISTORY OF PRESENT ILLNESS:  Wesley Newton is a 85 y.o. male with multiple medical problems including COPD, pulmonary emphysema, chronic cough, dyspnea on exertion, asthmatic bronchitis, diastolic heart failure, hypertension, hypercholesterolemia, gerd, rhinitis, anemia, BPH, lumbago. Palliative Care was asked to help address goals of care. Wesley Newton was recently hospitalized 3/15-3/17/2020 due to COPD exacerbation. He was seen by Pulmonologist, Dr. Raul Del on 03/12/2019. Wesley Newton presently lives at [facility] [place]. He states that he is able to perform adls, but this is taking longer for him and he fatigues easily. No assistive devices used for ambulation. He is on room air. He reports "a little" shortness of breath at rest; endorses shortness of breath with exertion. He reports back, shoulder and left hip pain; pain ranges from mild to severe. He is able to prepare small meals. Home Health has come out to evaluate patient; [facility]. His appetite is fair. He states he was 180 pounds one year ago; most recent weight is 147 pounds. He reports occasional bladder incontinence; continent of bowel. He reports occasional constipation; has senna-S in the home. He has lorazepam 0.5mg  every 8 hours prn ordered for anxiety; he has been taking at night. He is sleeping well at night.  He has a Living Will. Grand daughter Wesley Newton is HCPOA. Has three children. Daughter Wesley Newton lives nearby and is involved in care. He is open to having additional assistance in the home with adl's, cleaning and meal delivery through [facility]. He is a DNR.  CODE STATUS: DNR  PPS: 50% HOSPICE ELIGIBILITY/DIAGNOSIS: TBD  PAST MEDICAL HISTORY:  Past Medical History:  Diagnosis Date   Asthma    BPH (benign prostatic hyperplasia)    Bronchitis    Cancer ([facility])    COPD (chronic obstructive pulmonary disease) ([facility])    DVT of leg (deep venous thrombosis) ([facility])    Elevated PSA    Emphysema lung (H[facility])    Emphysema of lung (H[facility])    Hyperlipidemia    Nocturia    Pulmonary fibrosis (H[facility])    Pulmonary fibrosis (H[facility])    Skin cancer     SOCIAL HX:  Social History   Tobacco Use   Smoking status: Former Smoker   Smokeless tobacco: Never Used   Tobacco comment: quit 40 years  Substance Use Topics   Alcohol use: No    Alcohol/week: 0.0 standard drinks    ALLERGIES:  Allergies  Allergen Reactions   Amoxicillin Other (See Comments)    DID THE REACTION INVOLVE: Swelling of the face/tongue/throat, SOB, or low BP? Unknown Sudden or severe rash/hives, skin peeling, or the inside of the mouth or nose? Unknown Did it require medical treatment? Unknown When did it last happen?Unknown If all above answers are NO, may proceed with cephalosporin use.    Codeine Nausea And Vomiting   Rapaflo [Silodosin] Other (See Comments)    Reaction:  Unknown    Tramadol Nausea And Vomiting and Other (See Comments)    Pt states that this medication makes him feel crazy.     Trimethoprim Other (See Comments)    Reaction: unknown     PERTINENT MEDICATIONS:  Outpatient Encounter Medications as of 03/13/2019  Medication Sig   albuterol (PROVENTIL HFA;VENTOLIN HFA) 108 (90 Base) MCG/ACT inhaler Inhale 2 puffs into the lungs every 6 (six) hours as needed for wheezing or shortness of breath.   albuterol (PROVENTIL) (2.5 MG/3ML) 0.083% nebulizer solution Take 2.5 mg by nebulization every 6 (six) hours as needed for wheezing or shortness of breath.   aspirin EC 81 MG tablet Take 81 mg by mouth  daily.    azelastine (ASTELIN) 0.1 % nasal spray Place 1 spray into both nostrils daily.   benzonatate (TESSALON) 100 MG capsule Take 1 capsule (100 mg total) by mouth 3 (three) times daily as needed for cough.   budesonide (PULMICORT) 0.5 MG/2ML nebulizer solution Take 0.5 mg by nebulization daily.   dextromethorphan-guaiFENesin (MUCINEX DM) 30-600 MG 12hr tablet Take 1 tablet by mouth 2 (two) times daily.   finasteride (PROSCAR) 5 MG tablet Take 1 tablet (5 mg total) by mouth daily.   gabapentin (NEURONTIN) 100 MG capsule Take 100-200 mg by mouth 2 (two) times daily. Takes 100mg  each am, 200mg  each evening.   LORazepam (ATIVAN) 0.5 MG tablet Take 0.5 mg by mouth every 8 (eight) hours as needed.    Multiple Vitamin (MULTIVITAMIN WITH MINERALS) TABS tablet Take 1 tablet by mouth daily.   Multiple Vitamins-Minerals (CENTRUM SILVER ADULT 50+) TABS Take 1 tablet by mouth.   omeprazole (PRILOSEC) 20 MG capsule Take 20 mg by mouth daily.   simvastatin (ZOCOR) 40 MG tablet Take 40 mg by mouth at bedtime.    tamsulosin (FLOMAX) 0.4 MG  CAPS capsule Take 1 capsule (0.4 mg total) by mouth daily.   enoxaparin (LOVENOX) 40 MG/0.4ML injection Inject 40 mg into the skin daily.   feeding supplement, ENSURE ENLIVE, (ENSURE ENLIVE) LIQD Take 237 mLs by mouth 2 (two) times daily between meals.   ipratropium-albuterol (DUONEB) 0.5-2.5 (3) MG/3ML SOLN Inhale 3 mLs into the lungs 4 (four) times daily.   lidocaine (LIDODERM) 5 % Place 1 patch onto the skin daily. Remove & Discard patch within 12 hours or as directed by MD   mupirocin ointment (BACTROBAN) 2 % Place 1 application into the nose 2 (two) times daily.   naphazoline-glycerin (CLEAR EYES REDNESS) 0.012-0.2 % SOLN Place 1-2 drops into both eyes 4 (four) times daily as needed for eye irritation.   pantoprazole (PROTONIX) 40 MG tablet Take 40 mg by mouth daily.   predniSONE (STERAPRED UNI-PAK 21 TAB) 10 MG (21) TBPK tablet Take 4 tabs (40 mg)  day 1, 3 tabs (30 mg) day 2, 2 tabs (20 mg) day 3, 1 tab (10 mg) day 4.   No facility-administered encounter medications on file as of 03/13/2019.     PHYSICAL EXAM:   General: NAD, frail appearing, thin Cardiovascular: regular rate and rhythm Pulmonary: clear ant fields, crackles to right base Abdomen: soft, nontender, + bowel sounds GU: no suprapubic tenderness Extremities: no edema, no joint deformities Skin: no rashes Neurological: Weakness but otherwise nonfocal  Ezekiel Slocumb, NP

## 2019-03-17 ENCOUNTER — Other Ambulatory Visit: Payer: Self-pay | Admitting: Urology

## 2019-03-17 DIAGNOSIS — N138 Other obstructive and reflux uropathy: Secondary | ICD-10-CM

## 2019-03-17 DIAGNOSIS — N401 Enlarged prostate with lower urinary tract symptoms: Principal | ICD-10-CM

## 2019-03-18 ENCOUNTER — Telehealth: Payer: Self-pay | Admitting: Student

## 2019-03-18 ENCOUNTER — Other Ambulatory Visit: Payer: Self-pay | Admitting: Urology

## 2019-03-18 DIAGNOSIS — N138 Other obstructive and reflux uropathy: Secondary | ICD-10-CM

## 2019-03-18 DIAGNOSIS — N401 Enlarged prostate with lower urinary tract symptoms: Principal | ICD-10-CM

## 2019-03-18 NOTE — Telephone Encounter (Signed)
NP called patient to make him aware that Dr. Edwina Barth did agree to new order for morphine for dyspnea/severe pain. He is going to check with pharmacy about new prescription. He did state he has started taking his lorazepam in the morning, which has helped some with his shortness of breath. He is encouraged to call with questions.

## 2019-03-25 ENCOUNTER — Telehealth: Payer: Self-pay | Admitting: Urology

## 2019-03-25 NOTE — Telephone Encounter (Signed)
Pt needs a follow up w/ Larene Beach and would like a call back. He just got a refill for Finasteride 5mg .

## 2019-03-25 NOTE — Telephone Encounter (Signed)
Left message for patient to return call.

## 2019-03-26 NOTE — Telephone Encounter (Signed)
Yes.  Please set up a virtual visit with Wesley Newton.

## 2019-03-28 ENCOUNTER — Other Ambulatory Visit: Payer: Self-pay

## 2019-03-28 ENCOUNTER — Telehealth: Payer: Self-pay | Admitting: Urology

## 2019-03-28 ENCOUNTER — Telehealth (INDEPENDENT_AMBULATORY_CARE_PROVIDER_SITE_OTHER): Payer: Medicare Other | Admitting: Urology

## 2019-03-28 ENCOUNTER — Telehealth: Payer: Medicare Other | Admitting: Urology

## 2019-03-28 DIAGNOSIS — N401 Enlarged prostate with lower urinary tract symptoms: Secondary | ICD-10-CM | POA: Diagnosis not present

## 2019-03-28 DIAGNOSIS — R351 Nocturia: Secondary | ICD-10-CM

## 2019-03-28 DIAGNOSIS — N138 Other obstructive and reflux uropathy: Secondary | ICD-10-CM | POA: Diagnosis not present

## 2019-03-28 NOTE — Progress Notes (Signed)
Virtual Visit via Telephone Note  I connected with Wesley Newton on 03/28/2019 at 1227 by telephone and verified that I am speaking with the correct person using two identifiers.  They are located at [facility].   I am located at my home.    This visit type was conducted due to national recommendations for restrictions regarding the COVID-19 Pandemic (e.g. social distancing).  This format is felt to be most appropriate for this patient at this time.  All issues noted in this document were discussed and addressed.  No physical exam was performed.   I discussed the limitations, risks, security and privacy concerns of performing an evaluation and management service by telephone and the availability of in person appointments. I also discussed with the patient that there may be a patient responsible charge related to this service. The patient expressed understanding and agreed to proceed.   History of Present Illness: Wesley Newton is a 85 year old Caucasian male with a history of BPH with LUTS and nocturia who was contact today for yearly visit.  He states about 2 months ago he developed leakage of urine.  He states he has some urge incontinence and postvoid dribbling.  He states the leakage is enough to dampen his underwear.  He is having intermittent dysuria, a weaker urinary stream and intermittency.  He states that sometimes he feels he is not emptying all the way.  He is not straining to urinate and denies urinary hesitancy.  Patient denies any gross hematuria or suprapubic/flank pain.  Patient denies any fevers, chills, nausea or vomiting.  He is currently taking tamsulosin 0.4 mg daily and finasteride 5 mg daily.    He states he gets up 3-4 times nightly.       Observations/Objective:   Assessment and Plan:  1. BPH with LU TS We will increase patient's tamsulosin to 0.8 mg daily, so he will take 2 of the 0.4 mg capsules He will continue finasteride 5 mg daily We will have another  telephone visit as it is unlikely the pandemic restrictions will be lifted in 1 month  2.  Nocturia Continue to manage conservatively  Follow Up Instructions:  Wesley Newton will follow-up in 1 month with a telephone visit to see if the increase of tamsulosin 0.4 mg daily help with his recent onset of incontinence.  As he is of advanced age and has pulmonary fibrosis, we certainly do not want to expose him to the SARS-cov 2  at this time.  I discussed the assessment and treatment plan with the patient. The patient was provided an opportunity to ask questions and all were answered. The patient agreed with the plan and demonstrated an understanding of the instructions.   The patient was advised to call back or seek an in-person evaluation if the symptoms worsen or if the condition fails to improve as anticipated.  I provided 14  minutes of non-face-to-face time during this encounter.   Karmen Altamirano, PA-C

## 2019-03-28 NOTE — Telephone Encounter (Signed)
Would you call Wesley Newton and make a one month telephone visit follow up with me?

## 2019-04-02 ENCOUNTER — Telehealth: Payer: Self-pay | Admitting: Urology

## 2019-04-02 DIAGNOSIS — R351 Nocturia: Secondary | ICD-10-CM

## 2019-04-02 NOTE — Telephone Encounter (Signed)
Patient's daughter called the office today for clarification of patient's prescriptions and instructions.  Based on [facility] virtual visit on 03/28/2019, patient is to increase his tamsulosin and continue his finasteride.  Patient's daughter is requesting a new prescription with the new instructions to be sent to his pharmacy.  Please call her with questions or clarification.

## 2019-04-02 NOTE — Telephone Encounter (Signed)
Called patient to confirm that I could talk to [facility] as she is not on [place]. Patient gave consent. Also reexplained Shannons medication change to Tamsulosin to patient. He verbalized understanding. Returned call to patients daughter.  She explained that her father is confused and she just wanted to confirm and clear up any confusion on the medication changes as he is in [facility] and she is having a difficult time managing his care from "a distance" with the [place] pandemic, she relayed the message back from [place] regarding doubling the Tamsulosin to 0.8mg  daily and keeping Finasteride at 5mg  daily. I confirmed changes. She verbalized understanding.

## 2019-04-10 ENCOUNTER — Other Ambulatory Visit: Payer: Self-pay | Admitting: Urology

## 2019-04-10 DIAGNOSIS — N401 Enlarged prostate with lower urinary tract symptoms: Principal | ICD-10-CM

## 2019-04-10 DIAGNOSIS — N138 Other obstructive and reflux uropathy: Secondary | ICD-10-CM

## 2019-04-29 ENCOUNTER — Telehealth: Payer: Self-pay | Admitting: Urology

## 2019-04-29 NOTE — Telephone Encounter (Signed)
[facility] [phone]) called the office and left a message on the voice mail.  Patient told them that the doctor's office called and told him to double up on his finasteride.  Pharmacy states that they need a new prescription with new instructions.

## 2019-04-30 NOTE — Telephone Encounter (Signed)
I had to change his follow up app that was on 04-30-19 because the patient lives @ [facility] and was scheduled for a virtual app and due to the [place] his wife is unable to be with him and he can not communicate what needs to be told and she wanted to push the app out until he was able to have a OV visit. He does need the medication corrected below if someone could please advise on this.   Thanks, Sharyn Lull

## 2019-04-30 NOTE — Telephone Encounter (Signed)
I believe the patient may be confused with his medications. Wesley Newton's instructions were to double on the Tamsulosin not the Finasteride.  Patient went to the pharmacy and said that he was out of the Finasteride because he was told to double the dose.  There is a message on 4/21 from our clinical staff trying to clarify this for the patient.

## 2019-04-30 NOTE — Telephone Encounter (Signed)
He is to increase the tamsulosin to twice daily, not the finasteride.  Does he need new prescriptions?

## 2019-05-01 ENCOUNTER — Other Ambulatory Visit: Payer: Self-pay | Admitting: Urology

## 2019-05-01 ENCOUNTER — Telehealth: Payer: Medicare Other | Admitting: Urology

## 2019-05-01 DIAGNOSIS — N138 Other obstructive and reflux uropathy: Secondary | ICD-10-CM

## 2019-05-01 DIAGNOSIS — N401 Enlarged prostate with lower urinary tract symptoms: Secondary | ICD-10-CM

## 2019-05-01 MED ORDER — FINASTERIDE 5 MG PO TABS: 5.0000 mg | ORAL_TABLET | Freq: Every day | ORAL | 3 refills | Status: DC

## 2019-05-01 MED ORDER — TAMSULOSIN HCL 0.4 MG PO CAPS: 0.8000 mg | ORAL_CAPSULE | Freq: Every day | ORAL | 3 refills | Status: DC

## 2019-05-01 NOTE — Telephone Encounter (Signed)
Yes, per [facility], pt needs new RX for both please.

## 2019-05-04 ENCOUNTER — Other Ambulatory Visit: Payer: Self-pay

## 2019-05-04 ENCOUNTER — Emergency Department: Payer: Medicare Other

## 2019-05-04 ENCOUNTER — Encounter: Payer: Self-pay | Admitting: Emergency Medicine

## 2019-05-04 ENCOUNTER — Inpatient Hospital Stay
Admission: EM | Admit: 2019-05-04 | Discharge: 2019-05-07 | DRG: 308 | Disposition: A | Discharge status: Skilled Nursing Facility | Payer: Medicare Other | Attending: Internal Medicine | Admitting: Internal Medicine

## 2019-05-04 DIAGNOSIS — I951 Orthostatic hypotension: Secondary | ICD-10-CM | POA: Diagnosis present

## 2019-05-04 DIAGNOSIS — Z66 Do not resuscitate: Secondary | ICD-10-CM | POA: Diagnosis present

## 2019-05-04 DIAGNOSIS — E7800 Pure hypercholesterolemia, unspecified: Secondary | ICD-10-CM | POA: Diagnosis present

## 2019-05-04 DIAGNOSIS — R351 Nocturia: Secondary | ICD-10-CM | POA: Diagnosis present

## 2019-05-04 DIAGNOSIS — K219 Gastro-esophageal reflux disease without esophagitis: Secondary | ICD-10-CM | POA: Diagnosis present

## 2019-05-04 DIAGNOSIS — I4891 Unspecified atrial fibrillation: Principal | ICD-10-CM | POA: Diagnosis present

## 2019-05-04 DIAGNOSIS — N401 Enlarged prostate with lower urinary tract symptoms: Secondary | ICD-10-CM | POA: Diagnosis present

## 2019-05-04 DIAGNOSIS — Z8249 Family history of ischemic heart disease and other diseases of the circulatory system: Secondary | ICD-10-CM

## 2019-05-04 DIAGNOSIS — K224 Dyskinesia of esophagus: Secondary | ICD-10-CM | POA: Diagnosis present

## 2019-05-04 DIAGNOSIS — Z6822 Body mass index (BMI) 22.0-22.9, adult: Secondary | ICD-10-CM

## 2019-05-04 DIAGNOSIS — N400 Enlarged prostate without lower urinary tract symptoms: Secondary | ICD-10-CM | POA: Diagnosis present

## 2019-05-04 DIAGNOSIS — E43 Unspecified severe protein-calorie malnutrition: Secondary | ICD-10-CM | POA: Diagnosis present

## 2019-05-04 DIAGNOSIS — Z87891 Personal history of nicotine dependence: Secondary | ICD-10-CM

## 2019-05-04 DIAGNOSIS — Z86718 Personal history of other venous thrombosis and embolism: Secondary | ICD-10-CM

## 2019-05-04 DIAGNOSIS — R1310 Dysphagia, unspecified: Secondary | ICD-10-CM

## 2019-05-04 DIAGNOSIS — E785 Hyperlipidemia, unspecified: Secondary | ICD-10-CM | POA: Diagnosis present

## 2019-05-04 DIAGNOSIS — Z85828 Personal history of other malignant neoplasm of skin: Secondary | ICD-10-CM

## 2019-05-04 DIAGNOSIS — Z79899 Other long term (current) drug therapy: Secondary | ICD-10-CM

## 2019-05-04 DIAGNOSIS — J9610 Chronic respiratory failure, unspecified whether with hypoxia or hypercapnia: Secondary | ICD-10-CM | POA: Diagnosis present

## 2019-05-04 DIAGNOSIS — I10 Essential (primary) hypertension: Secondary | ICD-10-CM | POA: Diagnosis present

## 2019-05-04 DIAGNOSIS — Z7951 Long term (current) use of inhaled steroids: Secondary | ICD-10-CM

## 2019-05-04 DIAGNOSIS — Z7982 Long term (current) use of aspirin: Secondary | ICD-10-CM

## 2019-05-04 DIAGNOSIS — J84112 Idiopathic pulmonary fibrosis: Secondary | ICD-10-CM | POA: Diagnosis present

## 2019-05-04 DIAGNOSIS — Z20828 Contact with and (suspected) exposure to other viral communicable diseases: Secondary | ICD-10-CM | POA: Diagnosis present

## 2019-05-04 LAB — CBC WITH DIFFERENTIAL/PLATELET
Abs Immature Granulocytes: 0.02 10*3/uL (ref 0.00–0.07)
Basophils Absolute: 0 10*3/uL (ref 0.0–0.1)
Basophils Relative: 0 %
Eosinophils Absolute: 0.1 10*3/uL (ref 0.0–0.5)
Eosinophils Relative: 0 %
HCT: 41.4 % (ref 39.0–52.0)
Hemoglobin: 13.7 g/dL (ref 13.0–17.0)
Immature Granulocytes: 0 %
Lymphocytes Relative: 39 %
Lymphs Abs: 4.8 10*3/uL — ABNORMAL HIGH (ref 0.7–4.0)
MCH: 27.5 pg (ref 26.0–34.0)
MCHC: 33.1 g/dL (ref 30.0–36.0)
MCV: 83 fL (ref 80.0–100.0)
Monocytes Absolute: 1.3 10*3/uL — ABNORMAL HIGH (ref 0.1–1.0)
Monocytes Relative: 11 %
Neutro Abs: 6.2 10*3/uL (ref 1.7–7.7)
Neutrophils Relative %: 50 %
Platelets: 328 10*3/uL (ref 150–400)
RBC: 4.99 MIL/uL (ref 4.22–5.81)
RDW: 15.7 % — ABNORMAL HIGH (ref 11.5–15.5)
WBC: 12.4 10*3/uL — ABNORMAL HIGH (ref 4.0–10.5)
nRBC: 0 % (ref 0.0–0.2)

## 2019-05-04 LAB — COMPREHENSIVE METABOLIC PANEL
ALT: 22 U/L (ref 0–44)
AST: 26 U/L (ref 15–41)
Albumin: 3.7 g/dL (ref 3.5–5.0)
Alkaline Phosphatase: 93 U/L (ref 38–126)
Anion gap: 8 (ref 5–15)
BUN: 23 mg/dL (ref 8–23)
CO2: 29 mmol/L (ref 22–32)
Calcium: 9.4 mg/dL (ref 8.9–10.3)
Chloride: 102 mmol/L (ref 98–111)
Creatinine, Ser: 1.06 mg/dL (ref 0.61–1.24)
GFR calc Af Amer: >60 mL/min (ref >60)
GFR calc non Af Amer: >60 mL/min (ref >60)
Glucose, Bld: 113 mg/dL — ABNORMAL HIGH (ref 70–99)
Potassium: 4.2 mmol/L (ref 3.5–5.1)
Sodium: 139 mmol/L (ref 135–145)
Total Bilirubin: 0.5 mg/dL (ref 0.3–1.2)
Total Protein: 7.3 g/dL (ref 6.5–8.1)

## 2019-05-04 LAB — SARS CORONAVIRUS 2 BY RT PCR (HOSPITAL ORDER, PERFORMED IN ~~LOC~~ HOSPITAL LAB): SARS Coronavirus 2: NEGATIVE

## 2019-05-04 LAB — TROPONIN I: Troponin I: <0.03 ng/mL (ref <0.03)

## 2019-05-04 MED ORDER — DILTIAZEM HCL ER COATED BEADS 180 MG PO CP24
180.0000 mg | ORAL_CAPSULE | Freq: Once | ORAL | Status: AC
  Administered 2019-05-04: 180 mg via ORAL
  Filled 2019-05-04: qty 1

## 2019-05-04 MED ORDER — SODIUM CHLORIDE 0.9 % IV BOLUS
500.0000 mL | Freq: Once | INTRAVENOUS | Status: AC
  Administered 2019-05-04: 500 mL via INTRAVENOUS

## 2019-05-04 MED ORDER — DILTIAZEM HCL 25 MG/5ML IV SOLN
15.0000 mg | Freq: Once | INTRAVENOUS | Status: AC
  Administered 2019-05-04: 15 mg via INTRAVENOUS
  Filled 2019-05-04: qty 5

## 2019-05-04 MED ORDER — MAGNESIUM SULFATE 2 GM/50ML IV SOLN
2.0000 g | Freq: Once | INTRAVENOUS | Status: AC
  Administered 2019-05-04: 2 g via INTRAVENOUS
  Filled 2019-05-04: qty 50

## 2019-05-04 MED ORDER — DILTIAZEM HCL 100 MG IV SOLR
5.0000 mg/h | INTRAVENOUS | Status: DC
  Administered 2019-05-04: 5 mg/h via INTRAVENOUS
  Filled 2019-05-04: qty 100

## 2019-05-04 NOTE — ED Notes (Signed)
Pharm called

## 2019-05-04 NOTE — ED Triage Notes (Signed)
Patient presents to Emergency Department via [facility] from [place] with complaints of palpitations and SOB.  Pt with hx of COPD, asthma, and pulmonary sarcoidosis and pt does neb treatment x 5 daily  EMS reports afib with RVR upon initial contact - no hx of afib

## 2019-05-04 NOTE — ED Provider Notes (Signed)
[facility] Emergency Department Provider Note  ____________________________________________  Time seen: Approximately 10:41 PM  I have reviewed the triage vital signs and the nursing notes.   HISTORY  Chief Complaint Palpitations and Shortness of Breath   HPI Wesley Newton is a 85 y.o. male with history of COPD, asthma, pulmonary fibrosis who presents for evaluation of palpitations.  Patient reports over the last few days he has had intermittent episodes where he feels his heart pounding in his chest.  These episodes are associated with shortness of breath.  When the episodes resolve patient denies any shortness of breath.  No prior history of atrial fibrillation.  No changes on his chronic cough, no fever.  Patient has had no vomiting or diarrhea.  He denies any pain in his chest.  Past Medical History:  Diagnosis Date  . Asthma   . BPH (benign prostatic hyperplasia)   . Bronchitis   . Cancer ([place])   . COPD (chronic obstructive pulmonary disease) ([place])   . DVT of leg (deep venous thrombosis) ([place])   . Elevated PSA   . Emphysema lung ([place])   . Emphysema of lung ([place])   . Hyperlipidemia   . Nocturia   . Pulmonary fibrosis ([place])   . Pulmonary fibrosis ([place])   . Skin cancer     Patient Active Problem List   Diagnosis Date Noted  . Atrial fibrillation with RVR ([place]) 05/04/2019  . Idiopathic pulmonary fibrosis ([place]) 03/05/2019  . Malnutrition of moderate degree 12/21/2018  . Influenza B 12/19/2018  . CAP (community acquired pneumonia) 12/19/2018  . PAD (peripheral artery disease) ([place]) 04/25/2017  . BPH (benign prostatic hyperplasia) 01/10/2016  . Nocturia 01/10/2016  . Benign prostatic hyperplasia with urinary obstruction 01/10/2016  . AB (asthmatic bronchitis) 12/17/2014  . Carotid artery disease ([place]) 12/04/2014  . GERD (gastroesophageal reflux disease) 12/04/2014  . H/O respiratory system disease 12/04/2014  . H/O peptic ulcer 12/04/2014   . Hypercholesterolemia 12/04/2014  . Unstable angina pectoris ([place]) 12/04/2014  . Amnesia 12/04/2014  . Chronic obstructive pulmonary disease ([place]) 06/11/2014  . Cephalalgia 06/11/2014  . HTN (hypertension) 06/11/2014    Past Surgical History:  Procedure Laterality Date  . CATARACT EXTRACTION    . HERNIA REPAIR    . TONSILLECTOMY      Prior to Admission medications   Medication Sig Start Date End Date Taking? Authorizing Provider  albuterol (PROVENTIL HFA;VENTOLIN HFA) 108 (90 Base) MCG/ACT inhaler Inhale 2 puffs into the lungs every 6 (six) hours as needed for wheezing or shortness of breath.    [provider]  albuterol (PROVENTIL) (2.5 MG/3ML) 0.083% nebulizer solution Take 2.5 mg by nebulization every 6 (six) hours as needed for wheezing or shortness of breath.    [provider]  aspirin EC 81 MG tablet Take 81 mg by mouth daily.     [provider]  azelastine (ASTELIN) 0.1 % nasal spray Place 1 spray into both nostrils daily. 01/10/19   [provider]  benzonatate (TESSALON) 100 MG capsule Take 1 capsule (100 mg total) by mouth 3 (three) times daily as needed for cough. 12/21/18   Henreitta Leber, MD  budesonide (PULMICORT) 0.5 MG/2ML nebulizer solution Take 0.5 mg by nebulization daily.    [provider]  dextromethorphan-guaiFENesin (MUCINEX DM) 30-600 MG 12hr tablet Take 1 tablet by mouth 2 (two) times daily.    [provider]  enoxaparin (LOVENOX) 40 MG/0.4ML injection Inject 40 mg into the skin daily.  [provider]  feeding supplement, ENSURE ENLIVE, (ENSURE ENLIVE) LIQD Take 237 mLs by mouth 2 (two) times daily between meals. 12/21/18   Henreitta Leber, MD  finasteride (PROSCAR) 5 MG tablet Take 1 tablet (5 mg total) by mouth daily. 05/01/19   Zara Council A, PA-C  gabapentin (NEURONTIN) 100 MG capsule Take 100-200 mg by mouth 2 (two) times daily. Takes 100mg  each am, 200mg  each evening. 02/04/19    [provider]  ipratropium-albuterol (DUONEB) 0.5-2.5 (3) MG/3ML SOLN Inhale 3 mLs into the lungs 4 (four) times daily. 01/14/19   [provider]  lidocaine (LIDODERM) 5 % Place 1 patch onto the skin daily. Remove & Discard patch within 12 hours or as directed by MD    [provider]  LORazepam (ATIVAN) 0.5 MG tablet Take 0.5 mg by mouth every 8 (eight) hours as needed.  03/28/17   [provider]  Multiple Vitamin (MULTIVITAMIN WITH MINERALS) TABS tablet Take 1 tablet by mouth daily.    [provider]  Multiple Vitamins-Minerals (CENTRUM SILVER ADULT 50+) TABS Take 1 tablet by mouth.    [provider]  mupirocin ointment (BACTROBAN) 2 % Place 1 application into the nose 2 (two) times daily.    [provider]  naphazoline-glycerin (CLEAR EYES REDNESS) 0.012-0.2 % SOLN Place 1-2 drops into both eyes 4 (four) times daily as needed for eye irritation. 12/21/18   Henreitta Leber, MD  omeprazole (PRILOSEC) 20 MG capsule Take 20 mg by mouth daily.    [provider]  pantoprazole (PROTONIX) 40 MG tablet Take 40 mg by mouth daily.    [provider]  predniSONE (STERAPRED UNI-PAK 21 TAB) 10 MG (21) TBPK tablet Take 4 tabs (40 mg) day 1, 3 tabs (30 mg) day 2, 2 tabs (20 mg) day 3, 1 tab (10 mg) day 4. 03/05/19   Lule, Joana, PA  simvastatin (ZOCOR) 40 MG tablet Take 40 mg by mouth at bedtime.     [provider]  tamsulosin (FLOMAX) 0.4 MG CAPS capsule Take 2 capsules (0.8 mg total) by mouth daily. 05/01/19   Zara Council A, PA-C    Allergies Amoxicillin; Codeine; Rapaflo [silodosin]; Tramadol; and Trimethoprim  Family History  Problem Relation Age of Onset  . Heart Problems Brother   . Kidney disease Neg Hx   . Prostate cancer Neg Hx     Social History Social History   Tobacco Use  . Smoking status: Former Research scientist (life sciences)  . Smokeless tobacco: Never Used  . Tobacco comment: quit 40 years  Substance Use  Topics  . Alcohol use: No    Alcohol/week: 0.0 standard drinks  . Drug use: No    Review of Systems  Constitutional: Negative for fever. Eyes: Negative for visual changes. ENT: Negative for sore throat. Neck: No neck pain  Cardiovascular: Negative for chest pain. + palpitations Respiratory: + shortness of breath. Gastrointestinal: Negative for abdominal pain, vomiting or diarrhea. Genitourinary: Negative for dysuria. Musculoskeletal: Negative for back pain. Skin: Negative for rash. Neurological: Negative for headaches, weakness or numbness. Psych: No SI or HI  ____________________________________________   PHYSICAL EXAM:  VITAL SIGNS: ED Triage Vitals  Enc Vitals Group     BP 05/04/19 2207 137/77     Pulse Rate 05/04/19 2207 (!) 132     Resp 05/04/19 2207 (!) 22     Temp 05/04/19 2207 98.2 F (36.8 C)     Temp Source 05/04/19 2207 Oral     SpO2  05/04/19 2207 97 %     Weight 05/04/19 2208 150 lb (68 kg)     Height 05/04/19 2208 5\' 6"  (1.676 m)     Head Circumference --      Peak Flow --      Pain Score 05/04/19 2208 0     Pain Loc --      Pain Edu? --      Excl. in [place]? --     Constitutional: Alert and oriented. Well appearing and in no apparent distress. HEENT:      Head: Normocephalic and atraumatic.         Eyes: Conjunctivae are normal. Sclera is non-icteric.       Mouth/Throat: Mucous membranes are moist.       Neck: Supple with no signs of meningismus. Cardiovascular: Irregularly irregular rhythm with tachycardic. No murmurs, gallops, or rubs. 2+ symmetrical distal pulses are present in all extremities. No JVD. Respiratory: Normal respiratory effort. Lungs are clear to auscultation bilaterally with diffuse faint crackles.  Gastrointestinal: Soft, non tender, and non distended with positive bowel sounds. No rebound or guarding. Musculoskeletal: Nontender with normal range of motion in all extremities. No edema, cyanosis, or erythema of extremities.  Neurologic: Normal speech and language. Face is symmetric. Moving all extremities. No gross focal neurologic deficits are appreciated. Skin: Skin is warm, dry and intact. No rash noted. Psychiatric: Mood and affect are normal. Speech and behavior are normal.  ____________________________________________   LABS (all labs ordered are listed, but only abnormal results are displayed)  Labs Reviewed  CBC WITH DIFFERENTIAL/PLATELET - Abnormal; Notable for the following components:      Result Value   WBC 12.4 (*)    RDW 15.7 (*)    Lymphs Abs 4.8 (*)    Monocytes Absolute 1.3 (*)    All other components within normal limits  COMPREHENSIVE METABOLIC PANEL - Abnormal; Notable for the following components:   Glucose, Bld 113 (*)    All other components within normal limits  SARS CORONAVIRUS 2 (HOSPITAL ORDER, [facility])  TROPONIN I   ____________________________________________  EKG  ED ECG REPORT I, Rudene Re, the attending physician, personally viewed and interpreted this ECG.  Atrial fibrillation, rate of 133, normal QTC, normal axis, no ST elevations or depressions.  New when compared to prior. ____________________________________________  RADIOLOGY  I have personally reviewed the images performed during this visit and I agree with the Radiologist's read.   Interpretation by Radiologist:  Dg Chest Portable 1 View  Result Date: 05/04/2019 CLINICAL DATA:  Shortness of breath and palpitations EXAM: PORTABLE CHEST 1 VIEW COMPARISON:  03/03/2019 FINDINGS: Extensive interstitial opacity throughout both lungs is unchanged and consistent known interstitial lung disease. There is no area of confluent consolidation. No pleural effusion or pneumothorax. Normal cardiomediastinal contours. IMPRESSION: Chronic interstitial lung disease without acute consolidation. Electronically Signed   By: Ulyses Jarred M.D.   On: 05/04/2019 22:34      ____________________________________________   PROCEDURES  Procedure(s) performed: None Procedures Critical Care performed: yes  CRITICAL CARE Performed by: Rudene Re  ?  Total critical care time: 35 min  Critical care time was exclusive of separately billable procedures and treating other patients.  Critical care was necessary to treat or prevent imminent or life-threatening deterioration.  Critical care was time spent personally by me on the following activities: development of treatment plan with patient and/or surrogate as well as nursing, discussions with consultants, evaluation of patient's response to  treatment, examination of patient, obtaining history from patient or surrogate, ordering and performing treatments and interventions, ordering and review of laboratory studies, ordering and review of radiographic studies, pulse oximetry and re-evaluation of patient's condition.  ____________________________________________   INITIAL IMPRESSION / ASSESSMENT AND PLAN / ED COURSE   85 y.o. male with history of COPD, asthma, pulmonary fibrosis who presents for evaluation of intermittent palpitations and shortness of breath over the last few days.  No changes in his chronic cough or fever.  Patient arrives in A. fib with RVR.  Received 2 g of IV magnesium and 50 mg of IV Cardizem with improvement of his rate from the 140s to the 110s however slowly creeping back up.  Will start patient on a drip and give p.o. Cardizem in the meantime. CXR negative for PNA. COVID swab pending. Labs showed normal electrolytes, normal kidney function, negative troponin.  Discussed with Dr. Jannifer Franklin for admission      As part of my medical decision making, I reviewed the following data within the [place] notes reviewed and incorporated, Labs reviewed , EKG interpreted , Old EKG reviewed, Old chart reviewed, Radiograph reviewed , Discussed with admitting physician , Notes  from prior ED visits and [place] Controlled Substance Database    Pertinent labs & imaging results that were available during my care of the patient were reviewed by me and considered in my medical decision making (see chart for details).    ____________________________________________   FINAL CLINICAL IMPRESSION(S) / ED DIAGNOSES  Final diagnoses:  Atrial fibrillation with RVR ([place])      NEW MEDICATIONS STARTED DURING THIS VISIT:  ED Discharge Orders    None       Note:  This document was prepared using Dragon voice recognition software and may include unintentional dictation errors.    Alfred Levins, [place], MD 05/04/19 [phone]

## 2019-05-04 NOTE — ED Notes (Signed)
HR slowed to 110 avg att down from 140 avg

## 2019-05-04 NOTE — ED Notes (Signed)
ED TO INPATIENT HANDOFF REPORT  ED Nurse Name and Phone #: Ena Dawley [phone]  S Name/Age/Gender Wesley Newton 85 y.o. male Room/Bed: ED03A/ED03A  Code Status   Code Status: Prior  Home/SNF/Other Home Patient oriented to: self, place, time and situation Is this baseline? Yes   Triage Complete: Triage complete  Chief Complaint Rapid Heart Rate  Triage Note Patient presents to Emergency Department via [facility] from [place] with complaints of palpitations and SOB.  Pt with hx of COPD, asthma, and pulmonary sarcoidosis and pt does neb treatment x 5 daily  EMS reports afib with RVR upon initial contact - no hx of afib    Allergies Allergies  Allergen Reactions  . Amoxicillin Other (See Comments)    DID THE REACTION INVOLVE: Swelling of the face/tongue/throat, SOB, or low BP? Unknown Sudden or severe rash/hives, skin peeling, or the inside of the mouth or nose? Unknown Did it require medical treatment? Unknown When did it last happen?Unknown If all above answers are "NO", may proceed with cephalosporin use.   . Codeine Nausea And Vomiting  . Rapaflo [Silodosin] Other (See Comments)    Reaction:  Unknown   . Tramadol Nausea And Vomiting and Other (See Comments)    Pt states that this medication makes him feel crazy.    . Trimethoprim Other (See Comments)    Reaction: unknown    Level of Care/Admitting Diagnosis ED Disposition    ED Disposition Condition [facility] Area: [place] [[id number]]  Level of Care: Telemetry [5]  Covid Evaluation: Screening Protocol (No Symptoms)  Diagnosis: Atrial fibrillation with RVR [facility]) [[id number]]  Admitting Physician: Lance Coon [[id number]]  Attending Physician: Lance Coon [[id number]]  Bed request comments: 2a  PT Class (Do Not Modify): Observation [104]  PT Acc Code (Do Not Modify): Observation [10022]       B Medical/Surgery History Past Medical History:  Diagnosis Date   . Asthma   . BPH (benign prostatic hyperplasia)   . Bronchitis   . Cancer ([place])   . COPD (chronic obstructive pulmonary disease) ([place])   . DVT of leg (deep venous thrombosis) ([place])   . Elevated PSA   . Emphysema lung ([place])   . Emphysema of lung ([place])   . Hyperlipidemia   . Nocturia   . Pulmonary fibrosis ([place])   . Pulmonary fibrosis ([place])   . Skin cancer    Past Surgical History:  Procedure Laterality Date  . CATARACT EXTRACTION    . HERNIA REPAIR    . TONSILLECTOMY       A IV Location/Drains/Wounds Patient Lines/Drains/Airways Status   Active Line/Drains/Airways    Name:   Placement date:   Placement time:   Site:   Days:   Peripheral IV 05/04/19 Right Antecubital   05/04/19    2203    Antecubital   less than 1          Intake/Output Last 24 hours  Intake/Output Summary (Last 24 hours) at 05/04/2019 2323 Last data filed at 05/04/2019 2246 Gross per 24 hour  Intake 550 ml  Output 375 ml  Net 175 ml    Labs/Imaging Results for orders placed or performed during the hospital encounter of 05/04/19 (from the past 48 hour(s))  CBC with Differential/Platelet     Status: Abnormal   Collection Time: 05/04/19 10:13 PM  Result Value Ref Range   WBC 12.4 (H) 4.0 - 10.5 K/uL   RBC 4.99 4.22 - 5.81  MIL/uL   Hemoglobin 13.7 13.0 - 17.0 g/dL   HCT 41.4 39.0 - 52.0 %   MCV 83.0 80.0 - 100.0 fL   MCH 27.5 26.0 - 34.0 pg   MCHC 33.1 30.0 - 36.0 g/dL   RDW 15.7 (H) 11.5 - 15.5 %   Platelets 328 150 - 400 K/uL   nRBC 0.0 0.0 - 0.2 %   Neutrophils Relative % 50 %   Neutro Abs 6.2 1.7 - 7.7 K/uL   Lymphocytes Relative 39 %   Lymphs Abs 4.8 (H) 0.7 - 4.0 K/uL   Monocytes Relative 11 %   Monocytes Absolute 1.3 (H) 0.1 - 1.0 K/uL   Eosinophils Relative 0 %   Eosinophils Absolute 0.1 0.0 - 0.5 K/uL   Basophils Relative 0 %   Basophils Absolute 0.0 0.0 - 0.1 K/uL   Immature Granulocytes 0 %   Abs Immature Granulocytes 0.02 0.00 - 0.07 K/uL    Comment: Performed at [facility], [address] [place], [place] [address]  Comprehensive metabolic panel     Status: Abnormal   Collection Time: 05/04/19 10:13 PM  Result Value Ref Range   Sodium 139 135 - 145 mmol/L   Potassium 4.2 3.5 - 5.1 mmol/L   Chloride 102 98 - 111 mmol/L   CO2 29 22 - 32 mmol/L   Glucose, Bld 113 (H) 70 - 99 mg/dL   BUN 23 8 - 23 mg/dL   Creatinine, Ser 1.06 0.61 - 1.24 mg/dL   Calcium 9.4 8.9 - 10.3 mg/dL   Total Protein 7.3 6.5 - 8.1 g/dL   Albumin 3.7 3.5 - 5.0 g/dL   AST 26 15 - 41 U/L   ALT 22 0 - 44 U/L   Alkaline Phosphatase 93 38 - 126 U/L   Total Bilirubin 0.5 0.3 - 1.2 mg/dL   GFR calc non Af Amer >60 >60 mL/min   GFR calc Af Amer >60 >60 mL/min   Anion gap 8 5 - 15    Comment: Performed at [facility], [address] [place], [place] [address]  Troponin I - ONCE - STAT     Status: None   Collection Time: 05/04/19 10:13 PM  Result Value Ref Range   Troponin I <0.03 <0.03 ng/mL    Comment: Performed at [facility], [address] [place], [place] [address]   Dg Chest Portable 1 View  Result Date: 05/04/2019 CLINICAL DATA:  Shortness of breath and palpitations EXAM: PORTABLE CHEST 1 VIEW COMPARISON:  03/03/2019 FINDINGS: Extensive interstitial opacity throughout both lungs is unchanged and consistent known interstitial lung disease. There is no area of confluent consolidation. No pleural effusion or pneumothorax. Normal cardiomediastinal contours. IMPRESSION: Chronic interstitial lung disease without acute consolidation. Electronically Signed   By: Ulyses Jarred M.D.   On: 05/04/2019 22:34    Pending Labs Unresulted Labs (From admission, onward)    Start     Ordered   05/04/19 2202  SARS Coronavirus 2 (CEPHEID - Performed in [facility] hospital lab), Hosp Order  (Asymptomatic Patients Labs)  ONCE - STAT,   STAT    Question:  Rule Out  Answer:  Yes   05/04/19 2201   Signed and Held  CBC  (enoxaparin (LOVENOX)    CrCl >/= 30 ml/min)   Once,   R    Comments:  Baseline for enoxaparin therapy IF NOT ALREADY DRAWN.  Notify MD if PLT < 100 K.    Signed and Held  Signed and Held  Creatinine, serum  (enoxaparin (LOVENOX)    CrCl >/= 30 ml/min)  Once,   R    Comments:  Baseline for enoxaparin therapy IF NOT ALREADY DRAWN.    Signed and Held   Signed and Held  Creatinine, serum  (enoxaparin (LOVENOX)    CrCl >/= 30 ml/min)  Weekly,   R    Comments:  while on enoxaparin therapy    Signed and Held          Vitals/Pain Today's Vitals   05/04/19 2207 05/04/19 2208  BP: 137/77   Pulse: (!) 132   Resp: (!) 22   Temp: 98.2 F (36.8 C)   TempSrc: Oral   SpO2: 97%   Weight:  68 kg  Height:  5\' 6"  (1.676 m)  PainSc:  0-No pain    Isolation Precautions No active isolations  Medications Medications  diltiazem (CARDIZEM) 100 mg in dextrose 5 % 100 mL (1 mg/mL) infusion (5 mg/hr Intravenous New Bag/Given 05/04/19 2309)  diltiazem (CARDIZEM CD) 24 hr capsule 180 mg (has no administration in time range)  diltiazem (CARDIZEM) injection 15 mg (15 mg Intravenous Given 05/04/19 2223)  sodium chloride 0.9 % bolus 500 mL (0 mLs Intravenous Stopped 05/04/19 2246)  magnesium sulfate IVPB 2 g 50 mL (0 g Intravenous Stopped 05/04/19 2246)    Mobility walks High fall risk   Focused Assessments Cardiac Assessment Handoff:  Cardiac Rhythm: Atrial fibrillation Lab Results  Component Value Date   TROPONINI <0.03 05/04/2019   No results found for: DDIMER Does the Patient currently have chest pain? No     R Recommendations: See Admitting Provider Note  Report given to:   Additional Notes:  Pt granddaughter needs to be contacted, presbycusis

## 2019-05-04 NOTE — H&P (Signed)
[facility] NAME: Wesley Newton    MR#:  [id number]  DATE OF BIRTH:  04-Oct-1927  DATE OF ADMISSION:  05/04/2019  PRIMARY CARE PHYSICIAN: Baxter Hire, MD   REQUESTING/REFERRING PHYSICIAN: Alfred Levins, MD  CHIEF COMPLAINT:   Chief Complaint  Patient presents with  . Palpitations  . Shortness of Breath    HISTORY OF PRESENT ILLNESS:  Wesley Newton  is a 85 y.o. male who presents with chief complaint as above.  Patient presents the ED with a complaint of chest palpitations and some mild associated shortness of breath.  Here he is found to be in A. fib with RVR.  He has no prior history of this per his recollection or that can be found on chart review.  He was given a couple of doses of Cardizem in the ED and then placed on a Cardizem drip.  He had moderate response with some reduction in heart rate.  Hospitalist called for admission  PAST MEDICAL HISTORY:   Past Medical History:  Diagnosis Date  . Asthma   . BPH (benign prostatic hyperplasia)   . Bronchitis   . Cancer ([place])   . COPD (chronic obstructive pulmonary disease) ([place])   . DVT of leg (deep venous thrombosis) ([place])   . Elevated PSA   . Emphysema lung ([place])   . Emphysema of lung ([place])   . Hyperlipidemia   . Nocturia   . Pulmonary fibrosis ([place])   . Pulmonary fibrosis ([place])   . Skin cancer      PAST SURGICAL HISTORY:   Past Surgical History:  Procedure Laterality Date  . CATARACT EXTRACTION    . HERNIA REPAIR    . TONSILLECTOMY       SOCIAL HISTORY:   Social History   Tobacco Use  . Smoking status: Former Research scientist (life sciences)  . Smokeless tobacco: Never Used  . Tobacco comment: quit 40 years  Substance Use Topics  . Alcohol use: No    Alcohol/week: 0.0 standard drinks     FAMILY HISTORY:   Family History  Problem Relation Age of Onset  . Heart Problems Brother   . Kidney disease Neg Hx   . Prostate cancer Neg Hx      DRUG ALLERGIES:    Allergies  Allergen Reactions  . Amoxicillin Other (See Comments)    DID THE REACTION INVOLVE: Swelling of the face/tongue/throat, SOB, or low BP? Unknown Sudden or severe rash/hives, skin peeling, or the inside of the mouth or nose? Unknown Did it require medical treatment? Unknown When did it last happen?Unknown If all above answers are "NO", may proceed with cephalosporin use.   . Codeine Nausea And Vomiting  . Rapaflo [Silodosin] Other (See Comments)    Reaction:  Unknown   . Tramadol Nausea And Vomiting and Other (See Comments)    Pt states that this medication makes him feel crazy.    . Trimethoprim Other (See Comments)    Reaction: unknown    MEDICATIONS AT HOME:   Prior to Admission medications   Medication Sig Start Date End Date Taking? Authorizing Provider  albuterol (PROVENTIL HFA;VENTOLIN HFA) 108 (90 Base) MCG/ACT inhaler Inhale 2 puffs into the lungs every 6 (six) hours as needed for wheezing or shortness of breath.    [provider]  albuterol (PROVENTIL) (2.5 MG/3ML) 0.083% nebulizer solution Take 2.5 mg by nebulization every 6 (six) hours as needed for wheezing or shortness of breath.    [provider]  aspirin EC 81 MG tablet Take 81 mg by mouth daily.     [provider]  azelastine (ASTELIN) 0.1 % nasal spray Place 1 spray into both nostrils daily. 01/10/19   [provider]  benzonatate (TESSALON) 100 MG capsule Take 1 capsule (100 mg total) by mouth 3 (three) times daily as needed for cough. 12/21/18   Henreitta Leber, MD  budesonide (PULMICORT) 0.5 MG/2ML nebulizer solution Take 0.5 mg by nebulization daily.    [provider]  dextromethorphan-guaiFENesin (MUCINEX DM) 30-600 MG 12hr tablet Take 1 tablet by mouth 2 (two) times daily.    [provider]  enoxaparin (LOVENOX) 40 MG/0.4ML injection Inject 40 mg into the skin daily.    [provider]  feeding supplement, ENSURE ENLIVE, (ENSURE  ENLIVE) LIQD Take 237 mLs by mouth 2 (two) times daily between meals. 12/21/18   Henreitta Leber, MD  finasteride (PROSCAR) 5 MG tablet Take 1 tablet (5 mg total) by mouth daily. 05/01/19   Zara Council A, PA-C  gabapentin (NEURONTIN) 100 MG capsule Take 100-200 mg by mouth 2 (two) times daily. Takes 100mg  each am, 200mg  each evening. 02/04/19   [provider]  ipratropium-albuterol (DUONEB) 0.5-2.5 (3) MG/3ML SOLN Inhale 3 mLs into the lungs 4 (four) times daily. 01/14/19   [provider]  lidocaine (LIDODERM) 5 % Place 1 patch onto the skin daily. Remove & Discard patch within 12 hours or as directed by MD    [provider]  LORazepam (ATIVAN) 0.5 MG tablet Take 0.5 mg by mouth every 8 (eight) hours as needed.  03/28/17   [provider]  Multiple Vitamin (MULTIVITAMIN WITH MINERALS) TABS tablet Take 1 tablet by mouth daily.    [provider]  Multiple Vitamins-Minerals (CENTRUM SILVER ADULT 50+) TABS Take 1 tablet by mouth.    [provider]  mupirocin ointment (BACTROBAN) 2 % Place 1 application into the nose 2 (two) times daily.    [provider]  naphazoline-glycerin (CLEAR EYES REDNESS) 0.012-0.2 % SOLN Place 1-2 drops into both eyes 4 (four) times daily as needed for eye irritation. 12/21/18   Henreitta Leber, MD  omeprazole (PRILOSEC) 20 MG capsule Take 20 mg by mouth daily.    [provider]  pantoprazole (PROTONIX) 40 MG tablet Take 40 mg by mouth daily.    [provider]  predniSONE (STERAPRED UNI-PAK 21 TAB) 10 MG (21) TBPK tablet Take 4 tabs (40 mg) day 1, 3 tabs (30 mg) day 2, 2 tabs (20 mg) day 3, 1 tab (10 mg) day 4. 03/05/19   Lule, Joana, PA  simvastatin (ZOCOR) 40 MG tablet Take 40 mg by mouth at bedtime.     [provider]  tamsulosin (FLOMAX) 0.4 MG CAPS capsule Take 2 capsules (0.8 mg total) by mouth daily. 05/01/19   Zara Council A, PA-C    REVIEW OF SYSTEMS:  Review of Systems   Constitutional: Negative for chills, fever, malaise/fatigue and weight loss.  HENT: Negative for ear pain, hearing loss and tinnitus.   Eyes: Negative for blurred vision, double vision, pain and redness.  Respiratory: Positive for shortness of breath. Negative for cough and hemoptysis.   Cardiovascular: Positive for palpitations. Negative for chest pain, orthopnea and leg swelling.  Gastrointestinal: Negative for abdominal pain, constipation, diarrhea, nausea and vomiting.  Genitourinary: Negative for dysuria, frequency and hematuria.  Musculoskeletal: Negative for back pain, joint pain and neck pain.  Skin:  No acne, rash, or lesions  Neurological: Negative for dizziness, tremors, focal weakness and weakness.  Endo/Heme/Allergies: Negative for polydipsia. Does not bruise/bleed easily.  Psychiatric/Behavioral: Negative for depression. The patient is not nervous/anxious and does not have insomnia.      VITAL SIGNS:   Vitals:   05/04/19 2207 05/04/19 2208  BP: 137/77   Pulse: (!) 132   Resp: (!) 22   Temp: 98.2 F (36.8 C)   TempSrc: Oral   SpO2: 97%   Weight:  68 kg  Height:  5\' 6"  (1.676 m)   Wt Readings from Last 3 Encounters:  05/04/19 68 kg  03/13/19 66.7 kg  03/05/19 65.1 kg    PHYSICAL EXAMINATION:  Physical Exam  Vitals reviewed. Constitutional: He is oriented to person, place, and time. He appears well-developed and well-nourished. No distress.  HENT:  Head: Normocephalic and atraumatic.  Mouth/Throat: Oropharynx is clear and moist.  Eyes: Pupils are equal, round, and reactive to light. Conjunctivae and EOM are normal. No scleral icterus.  Neck: Normal range of motion. Neck supple. No JVD present. No thyromegaly present.  Cardiovascular: Intact distal pulses. Exam reveals no gallop and no friction rub.  No murmur heard. Tachycardic, irregular rhythm  Respiratory: Effort normal and breath sounds normal. No respiratory distress. He has no wheezes. He has no  rales.  GI: Soft. Bowel sounds are normal. He exhibits no distension. There is no abdominal tenderness.  Musculoskeletal: Normal range of motion.        General: No edema.     Comments: No arthritis, no gout  Lymphadenopathy:    He has no cervical adenopathy.  Neurological: He is alert and oriented to person, place, and time. No cranial nerve deficit.  No dysarthria, no aphasia  Skin: Skin is warm and dry. No rash noted. No erythema.  Psychiatric: He has a normal mood and affect. His behavior is normal. Judgment and thought content normal.    LABORATORY PANEL:   CBC Recent Labs  Lab 05/04/19 2213  WBC 12.4*  HGB 13.7  HCT 41.4  PLT 328   ------------------------------------------------------------------------------------------------------------------  Chemistries  Recent Labs  Lab 05/04/19 2213  NA 139  K 4.2  CL 102  CO2 29  GLUCOSE 113*  BUN 23  CREATININE 1.06  CALCIUM 9.4  AST 26  ALT 22  ALKPHOS 93  BILITOT 0.5   ------------------------------------------------------------------------------------------------------------------  Cardiac Enzymes Recent Labs  Lab 05/04/19 2213  TROPONINI <0.03   ------------------------------------------------------------------------------------------------------------------  RADIOLOGY:  Dg Chest Portable 1 View  Result Date: 05/04/2019 CLINICAL DATA:  Shortness of breath and palpitations EXAM: PORTABLE CHEST 1 VIEW COMPARISON:  03/03/2019 FINDINGS: Extensive interstitial opacity throughout both lungs is unchanged and consistent known interstitial lung disease. There is no area of confluent consolidation. No pleural effusion or pneumothorax. Normal cardiomediastinal contours. IMPRESSION: Chronic interstitial lung disease without acute consolidation. Electronically Signed   By: Ulyses Jarred M.D.   On: 05/04/2019 22:34    EKG:   Orders placed or performed during the hospital encounter of 05/04/19  . EKG 12-Lead  . EKG  12-Lead    IMPRESSION AND PLAN:  Principal Problem:   Atrial fibrillation with RVR (HCC) -patient was given Cardizem with modest response in his heart rate, and then placed on a Cardizem drip.  We will admit him to the cardiac floor with continue Cardizem drip and get a cardiology consult.  Will defer to cardiology's recommendation as to whether or not they want repeat echo as the patient just had  1 2 months ago. Active Problems:   HTN (hypertension) -continue home meds   Idiopathic pulmonary fibrosis (HCC) -home dose inhalers   BPH (benign prostatic hyperplasia) -continue home meds   GERD (gastroesophageal reflux disease) -home dose PPI   Hypercholesterolemia -home dose antilipid  Chart review performed and case discussed with ED provider. Labs, imaging and/or ECG reviewed by provider and discussed with patient/family. Management plans discussed with the patient and/or family.  COVID-19 status: Test result pending   DVT PROPHYLAXIS: SubQ lovenox   GI PROPHYLAXIS:  PPI   ADMISSION STATUS: Observation  CODE STATUS: DNR Code Status History    Date Active Date Inactive Code Status Order ID Comments User Context   03/03/2019 2208 03/05/2019 1734 DNR [id number]  Arta Silence, MD Inpatient   12/19/2018 0234 12/21/2018 1734 Full Code [id number]  Lance Coon, MD Inpatient    Questions for Most Recent Historical Code Status (Order [id number])    Question Answer Comment   In the event of cardiac or respiratory ARREST Do not call a "code blue"    In the event of cardiac or respiratory ARREST Do not perform Intubation, CPR, defibrillation or ACLS    In the event of cardiac or respiratory ARREST Use medication by any route, position, wound care, and other measures to relive pain and suffering. May use oxygen, suction and manual treatment of airway obstruction as needed for comfort.         Advance Directive Documentation     Most Recent Value  Type of Advance Directive  Healthcare Power  of [place], Out of facility DNR (pink MOST or yellow form)  Pre-existing out of facility DNR order (yellow form or pink MOST form)  Yellow form placed in chart (order not valid for inpatient use)  "MOST" Form in Place?  -      TOTAL TIME TAKING CARE OF THIS PATIENT: 40 minutes.   This patient was evaluated in the context of the global COVID-19 pandemic, which necessitated consideration that the patient might be at risk for infection with the SARS-CoV-2 virus that causes COVID-19. Institutional protocols and algorithms that pertain to the evaluation of patients at risk for COVID-19 are in a state of rapid change based on information released by regulatory bodies including the CDC and federal and state organizations. These policies and algorithms were followed to the best of this provider's knowledge to date during the patient's care at this facility.  Ethlyn Daniels 05/04/2019, 11:06 PM  [facility]  Office  [phone]  CC: Primary care physician; Baxter Hire, MD  Note:  This document was prepared using Dragon voice recognition software and may include unintentional dictation errors.

## 2019-05-04 NOTE — ED Notes (Signed)
rTE CHANGED PER VERBAL ORDER

## 2019-05-04 NOTE — ED Notes (Signed)
Brittney, RN to call with questions within 5-10 min

## 2019-05-04 NOTE — ED Notes (Signed)
First call to floor, Kirsten to transfer to [facility]

## 2019-05-05 MED ORDER — DILTIAZEM HCL-DEXTROSE 100-5 MG/100ML-% IV SOLN (PREMIX)
5.0000 mg/h | INTRAVENOUS | Status: DC
  Filled 2019-05-05: qty 100

## 2019-05-05 MED ORDER — AZELASTINE HCL 0.1 % NA SOLN
1.0000 | Freq: Every day | NASAL | Status: DC
  Administered 2019-05-06 – 2019-05-07 (×2): 1 via NASAL
  Filled 2019-05-05: qty 30

## 2019-05-05 MED ORDER — APIXABAN 2.5 MG PO TABS
2.5000 mg | ORAL_TABLET | Freq: Two times a day (BID) | ORAL | Status: DC
  Administered 2019-05-05 – 2019-05-07 (×5): 2.5 mg via ORAL
  Filled 2019-05-05 (×5): qty 1

## 2019-05-05 MED ORDER — ATORVASTATIN CALCIUM 20 MG PO TABS
20.0000 mg | ORAL_TABLET | Freq: Every day | ORAL | Status: DC
  Administered 2019-05-05 – 2019-05-06 (×2): 20 mg via ORAL
  Filled 2019-05-05 (×2): qty 1

## 2019-05-05 MED ORDER — DOCUSATE SODIUM 100 MG PO CAPS
100.0000 mg | ORAL_CAPSULE | Freq: Every day | ORAL | Status: DC
  Administered 2019-05-05 – 2019-05-06 (×2): 100 mg via ORAL
  Filled 2019-05-05 (×2): qty 1

## 2019-05-05 MED ORDER — ONDANSETRON HCL 4 MG PO TABS: 4.0000 mg | ORAL_TABLET | Freq: Four times a day (QID) | ORAL | Status: DC | PRN

## 2019-05-05 MED ORDER — PANTOPRAZOLE SODIUM 40 MG PO TBEC
40.0000 mg | DELAYED_RELEASE_TABLET | Freq: Every day | ORAL | Status: DC
  Administered 2019-05-05 – 2019-05-07 (×3): 40 mg via ORAL
  Filled 2019-05-05 (×3): qty 1

## 2019-05-05 MED ORDER — ACETAMINOPHEN 650 MG RE SUPP: 650.0000 mg | Freq: Four times a day (QID) | RECTAL | Status: DC | PRN

## 2019-05-05 MED ORDER — BUDESONIDE 0.5 MG/2ML IN SUSP
0.5000 mg | Freq: Every day | RESPIRATORY_TRACT | Status: DC
  Administered 2019-05-05 – 2019-05-06 (×2): 0.5 mg via RESPIRATORY_TRACT
  Filled 2019-05-05 (×3): qty 2

## 2019-05-05 MED ORDER — ENOXAPARIN SODIUM 40 MG/0.4ML ~~LOC~~ SOLN: 40.0000 mg | SUBCUTANEOUS | Status: DC

## 2019-05-05 MED ORDER — ACETAMINOPHEN 325 MG PO TABS
650.0000 mg | ORAL_TABLET | Freq: Four times a day (QID) | ORAL | Status: DC | PRN
  Administered 2019-05-06: 650 mg via ORAL
  Filled 2019-05-05: qty 2

## 2019-05-05 MED ORDER — ONDANSETRON HCL 4 MG/2ML IJ SOLN
4.0000 mg | Freq: Four times a day (QID) | INTRAMUSCULAR | Status: DC | PRN
  Administered 2019-05-06: 4 mg via INTRAVENOUS
  Filled 2019-05-05: qty 2

## 2019-05-05 MED ORDER — TAMSULOSIN HCL 0.4 MG PO CAPS
0.8000 mg | ORAL_CAPSULE | Freq: Every day | ORAL | Status: DC
  Administered 2019-05-05 – 2019-05-07 (×3): 0.8 mg via ORAL
  Filled 2019-05-05 (×3): qty 2

## 2019-05-05 MED ORDER — FINASTERIDE 5 MG PO TABS
5.0000 mg | ORAL_TABLET | Freq: Every day | ORAL | Status: DC
  Administered 2019-05-05 – 2019-05-07 (×3): 5 mg via ORAL
  Filled 2019-05-05 (×3): qty 1

## 2019-05-05 MED ORDER — ASPIRIN EC 81 MG PO TBEC
81.0000 mg | DELAYED_RELEASE_TABLET | Freq: Every day | ORAL | Status: DC
  Administered 2019-05-05: 81 mg via ORAL
  Filled 2019-05-05: qty 1

## 2019-05-05 MED ORDER — SODIUM CHLORIDE 0.9 % IV SOLN
INTRAVENOUS | Status: DC | PRN
  Administered 2019-05-05: 1000 mL via INTRAVENOUS

## 2019-05-05 MED ORDER — SIMVASTATIN 20 MG PO TABS: 40.0000 mg | ORAL_TABLET | Freq: Every day | ORAL | Status: DC

## 2019-05-05 MED ORDER — MIDODRINE HCL 5 MG PO TABS
5.0000 mg | ORAL_TABLET | Freq: Three times a day (TID) | ORAL | Status: DC
  Administered 2019-05-05 – 2019-05-06 (×4): 5 mg via ORAL
  Filled 2019-05-05 (×4): qty 1

## 2019-05-05 MED ORDER — SODIUM CHLORIDE 0.9 % IV BOLUS
500.0000 mL | Freq: Once | INTRAVENOUS | Status: AC
  Administered 2019-05-05: 500 mL via INTRAVENOUS

## 2019-05-05 MED ORDER — AMIODARONE HCL 200 MG PO TABS
200.0000 mg | ORAL_TABLET | Freq: Two times a day (BID) | ORAL | Status: DC
  Administered 2019-05-05 – 2019-05-07 (×5): 200 mg via ORAL
  Filled 2019-05-05 (×5): qty 1

## 2019-05-05 MED ORDER — LEVALBUTEROL HCL 1.25 MG/0.5ML IN NEBU
1.2500 mg | INHALATION_SOLUTION | Freq: Four times a day (QID) | RESPIRATORY_TRACT | Status: DC
  Administered 2019-05-05 – 2019-05-07 (×6): 1.25 mg via RESPIRATORY_TRACT
  Filled 2019-05-05 (×11): qty 0.5

## 2019-05-05 MED ORDER — SODIUM CHLORIDE 0.9 % IV SOLN
INTRAVENOUS | Status: AC
  Administered 2019-05-05: 03:00:00 via INTRAVENOUS

## 2019-05-05 MED ORDER — LORAZEPAM 0.5 MG PO TABS
0.5000 mg | ORAL_TABLET | Freq: Three times a day (TID) | ORAL | Status: DC | PRN
  Administered 2019-05-06: 0.5 mg via ORAL
  Filled 2019-05-05: qty 1

## 2019-05-05 MED ORDER — GUAIFENESIN ER 600 MG PO TB12
600.0000 mg | ORAL_TABLET | Freq: Two times a day (BID) | ORAL | Status: DC
  Administered 2019-05-05 – 2019-05-07 (×5): 600 mg via ORAL
  Filled 2019-05-05 (×5): qty 1

## 2019-05-05 NOTE — Progress Notes (Signed)
  Amiodarone Drug - Drug Interaction Consult Note  Recommendations: Changed simvastatin 40mg  to Atorvastatin 20mg  per protocol  Amiodarone is metabolized by the cytochrome P450 system and therefore has the potential to cause many drug interactions. Amiodarone has an average plasma half-life of 50 days (range 20 to 100 days).   There is potential for drug interactions to occur several weeks or months after stopping treatment and the onset of drug interactions may be slow after initiating amiodarone.   [x]  Statins: Increased risk of myopathy.               Simvastatin- restrict dose to 20mg  daily     Thank You,   Lu Duffel, PharmD, BCPS Clinical Pharmacist 05/05/2019 8:26 PM

## 2019-05-05 NOTE — ED Notes (Signed)
Questions from [place], RN, pt to floor att

## 2019-05-05 NOTE — Care Management Obs Status (Signed)
MEDICARE OBSERVATION STATUS NOTIFICATION   Patient Details  Name: DEMITRIOUS MCCANNON MRN: [id number] Date of Birth: 1927-10-13   Medicare Observation Status Notification Given:  Yes    Diasha Castleman A Danyah Guastella, RN 05/05/2019, 11:34 AM

## 2019-05-05 NOTE — Progress Notes (Signed)
[place] [facility]                                                                                                                                                                                  Patient Demographics   Wesley Newton, is a 85 y.o. male, DOB - [birth year], IOE:[id number]  Admit date - 05/04/2019   Admitting Physician Lance Coon, MD  Outpatient Primary MD for the patient is Baxter Hire, MD   LOS - 0  Subjective: Patient feeling better shortness of breath improved.  Has chronic cough.    Review of Systems:   CONSTITUTIONAL: No documented fever. No fatigue, weakness. No weight gain, no weight loss.  EYES: No blurry or double vision.  ENT: No tinnitus. No postnasal drip. No redness of the oropharynx.  RESPIRATORY: Positive cough, no wheeze, no hemoptysis.  Positive dyspnea.  CARDIOVASCULAR: No chest pain. No orthopnea. No palpitations. No syncope.  GASTROINTESTINAL: No nausea, no vomiting or diarrhea. No abdominal pain. No melena or hematochezia.  GENITOURINARY: No dysuria or hematuria.  ENDOCRINE: No polyuria or nocturia. No heat or cold intolerance.  HEMATOLOGY: No anemia. No bruising. No bleeding.  INTEGUMENTARY: No rashes. No lesions.  MUSCULOSKELETAL: No arthritis. No swelling. No gout.  NEUROLOGIC: No numbness, tingling, or ataxia. No seizure-type activity.  PSYCHIATRIC: No anxiety. No insomnia. No ADD.    Vitals:   Vitals:   05/05/19 0630 05/05/19 0700 05/05/19 0730 05/05/19 0932  BP: (!) 85/59 (!) 96/49 (!) 92/56 98/65  Pulse: 78 70 79 80  Resp:    16  Temp:    98.5 F (36.9 C)  TempSrc:    Oral  SpO2:    95%  Weight:      Height:        Wt Readings from Last 3 Encounters:  05/04/19 68 kg  03/13/19 66.7 kg  03/05/19 65.1 kg     Intake/Output Summary (Last 24 hours) at 05/05/2019 1414 Last data filed at 05/05/2019 0840 Gross per 24 hour  Intake 1439.16 ml  Output 925 ml  Net 514.16 ml    Physical  Exam:   GENERAL: Pleasant-appearing in no apparent distress.  HEAD, EYES, EARS, NOSE AND THROAT: Atraumatic, normocephalic. Extraocular muscles are intact. Pupils equal and reactive to light. Sclerae anicteric. No conjunctival injection. No oro-pharyngeal erythema.  NECK: Supple. There is no jugular venous distention. No bruits, no lymphadenopathy, no thyromegaly.  HEART: Irregularly irregular,. No murmurs, no rubs, no clicks.  LUNGS: Clear to auscultation bilaterally. No rales or rhonchi. No wheezes.  ABDOMEN: Soft, flat, nontender, nondistended. Has good bowel sounds. No hepatosplenomegaly appreciated.  EXTREMITIES: No evidence of any cyanosis,  clubbing, or peripheral edema.  +2 pedal and radial pulses bilaterally.  NEUROLOGIC: The patient is alert, awake, and oriented x3 with no focal motor or sensory deficits appreciated bilaterally.  SKIN: Moist and warm with no rashes appreciated.  Psych: Not anxious, depressed LN: No inguinal LN enlargement    Antibiotics   Anti-infectives (From admission, onward)   None      Medications   Scheduled Meds: . aspirin EC  81 mg Oral Daily  . budesonide  0.5 mg Nebulization Daily  . enoxaparin (LOVENOX) injection  40 mg Subcutaneous Q24H  . finasteride  5 mg Oral Daily  . guaiFENesin  600 mg Oral BID  . levalbuterol  1.25 mg Nebulization Q6H  . midodrine  5 mg Oral TID WC  . pantoprazole  40 mg Oral Daily  . simvastatin  40 mg Oral QHS  . tamsulosin  0.8 mg Oral Daily   Continuous Infusions: . sodium chloride 10 mL/hr at 05/05/19 0044  . sodium chloride 100 mL/hr at 05/05/19 0644  . diltiazem (CARDIZEM) infusion Stopped (05/05/19 0510)   PRN Meds:.sodium chloride, acetaminophen **OR** acetaminophen, LORazepam, ondansetron **OR** ondansetron (ZOFRAN) IV   Data Review:   Micro Results Recent Results (from the past 240 hour(s))  SARS Coronavirus 2 (CEPHEID - Performed in [place] hospital lab), Hosp Order     Status: None    Collection Time: 05/04/19 10:13 PM  Result Value Ref Range Status   SARS Coronavirus 2 NEGATIVE NEGATIVE Final    Comment: (NOTE) If result is NEGATIVE SARS-CoV-2 target nucleic acids are NOT DETECTED. The SARS-CoV-2 RNA is generally detectable in upper and lower  respiratory specimens during the acute phase of infection. The lowest  concentration of SARS-CoV-2 viral copies this assay can detect is 250  copies / mL. A negative result does not preclude SARS-CoV-2 infection  and should not be used as the sole basis for treatment or other  patient management decisions.  A negative result may occur with  improper specimen collection / handling, submission of specimen other  than nasopharyngeal swab, presence of viral mutation(s) within the  areas targeted by this assay, and inadequate number of viral copies  (<250 copies / mL). A negative result must be combined with clinical  observations, patient history, and epidemiological information. If result is POSITIVE SARS-CoV-2 target nucleic acids are DETECTED. The SARS-CoV-2 RNA is generally detectable in upper and lower  respiratory specimens dur ing the acute phase of infection.  Positive  results are indicative of active infection with SARS-CoV-2.  Clinical  correlation with patient history and other diagnostic information is  necessary to determine patient infection status.  Positive results do  not rule out bacterial infection or co-infection with other viruses. If result is PRESUMPTIVE POSTIVE SARS-CoV-2 nucleic acids MAY BE PRESENT.   A presumptive positive result was obtained on the submitted specimen  and confirmed on repeat testing.  While 2019 novel coronavirus  (SARS-CoV-2) nucleic acids may be present in the submitted sample  additional confirmatory testing may be necessary for epidemiological  and / or clinical management purposes  to differentiate between  SARS-CoV-2 and other Sarbecovirus currently known to infect humans.   If clinically indicated additional testing with an alternate test  methodology [phone]) is advised. The SARS-CoV-2 RNA is generally  detectable in upper and lower respiratory sp ecimens during the acute  phase of infection. The expected result is Negative. Fact Sheet for Patients:  StrictlyIdeas.no Fact Sheet for Healthcare Providers: BankingDealers.co.za This test is  not yet approved or cleared by the [place] and has been authorized for detection and/or diagnosis of SARS-CoV-2 by FDA under an Emergency Use Authorization (EUA).  This EUA will remain in effect (meaning this test can be used) for the duration of the COVID-19 declaration under Section 564(b)(1) of the Act, 21 U.S.C. section 360bbb-3(b)(1), unless the authorization is terminated or revoked sooner. Performed at [facility], [address], [place], [place] [address]     Radiology Reports Dg Chest Portable 1 View  Result Date: 05/04/2019 CLINICAL DATA:  Shortness of breath and palpitations EXAM: PORTABLE CHEST 1 VIEW COMPARISON:  03/03/2019 FINDINGS: Extensive interstitial opacity throughout both lungs is unchanged and consistent known interstitial lung disease. There is no area of confluent consolidation. No pleural effusion or pneumothorax. Normal cardiomediastinal contours. IMPRESSION: Chronic interstitial lung disease without acute consolidation. Electronically Signed   By: Ulyses Jarred M.D.   On: 05/04/2019 22:34     CBC Recent Labs  Lab 05/04/19 2213  WBC 12.4*  HGB 13.7  HCT 41.4  PLT 328  MCV 83.0  MCH 27.5  MCHC 33.1  RDW 15.7*  LYMPHSABS 4.8*  MONOABS 1.3*  EOSABS 0.1  BASOSABS 0.0    Chemistries  Recent Labs  Lab 05/04/19 2213  NA 139  K 4.2  CL 102  CO2 29  GLUCOSE 113*  BUN 23  CREATININE 1.06  CALCIUM 9.4  AST 26  ALT 22  ALKPHOS 93  BILITOT 0.5    ------------------------------------------------------------------------------------------------------------------ estimated creatinine clearance is 41.8 mL/min (by C-G formula based on SCr of 1.06 mg/dL). ------------------------------------------------------------------------------------------------------------------ No results for input(s): HGBA1C in the last 72 hours. ------------------------------------------------------------------------------------------------------------------ No results for input(s): CHOL, HDL, LDLCALC, TRIG, CHOLHDL, LDLDIRECT in the last 72 hours. ------------------------------------------------------------------------------------------------------------------ No results for input(s): TSH, T4TOTAL, T3FREE, THYROIDAB in the last 72 hours.  Invalid input(s): FREET3 ------------------------------------------------------------------------------------------------------------------ No results for input(s): VITAMINB12, FOLATE, FERRITIN, TIBC, IRON, RETICCTPCT in the last 72 hours.  Coagulation profile No results for input(s): INR, PROTIME in the last 168 hours.  No results for input(s): DDIMER in the last 72 hours.  Cardiac Enzymes Recent Labs  Lab 05/04/19 2213  TROPONINI <0.03   ------------------------------------------------------------------------------------------------------------------ Invalid input(s): POCBNP    Assessment & Plan  Patient is 85 year old with pulmonary fibrosis presenting with shortness of breath palpitations  Atrial fibrillation with RVR ([place]) -patient was on Cardizem drip now taken off due to blood pressure being low.  Awaiting cardiology input, continue aspirin  Chronic shortness of breath due to pulmonary fibrosis continue nebulizer therapy patient states that he uses nebs 5 times a day I will place him on Xopenex nebs due to elevated heart rate  HTN (hypertension) -blood pressure is low BPH (benign prostatic hyperplasia)  -continue home meds  GERD (gastroesophageal reflux disease) -home dose PPI  Hypercholesterolemia -continue home dose antilipid      Code Status Orders  (From admission, onward)         Start     Ordered   05/05/19 0034  Do not attempt resuscitation (DNR)  Continuous    Question Answer Comment  In the event of cardiac or respiratory ARREST Do not call a "code blue"   In the event of cardiac or respiratory ARREST Do not perform Intubation, CPR, defibrillation or ACLS   In the event of cardiac or respiratory ARREST Use medication by any route, position, wound care, and other measures to relive pain and suffering. May use oxygen, suction and manual treatment of airway obstruction as  needed for comfort.      05/05/19 0033        Code Status History    Date Active Date Inactive Code Status Order ID Comments User Context   03/03/2019 2208 03/05/2019 1734 DNR [id number]  Arta Silence, MD Inpatient   12/19/2018 0234 12/21/2018 1734 Full Code [id number]  Lance Coon, MD Inpatient    Advance Directive Documentation     Most Recent Value  Type of Advance Directive  Healthcare Power of [place], Out of facility DNR (pink MOST or yellow form)  Pre-existing out of facility DNR order (yellow form or pink MOST form)  Yellow form placed in chart (order not valid for inpatient use)  "MOST" Form in Place?  -           Consults   cardiology DVT Prophylaxis  Lovenox   Lab Results  Component Value Date   PLT 328 05/04/2019     Time Spent in minutes   19min Greater than 50% of time spent in care coordination and counseling patient regarding the condition and plan of care.   Dustin Flock M.D on 05/05/2019 at 2:14 PM  Between 7am to 6pm - Pager - [phone]  After 6pm go to [email or web address] - Proofreader  [employer]   Office  [phone]

## 2019-05-05 NOTE — Consult Note (Signed)
Reason for Consult: Atrial fibrillation shortness of breath palpitations Referring Physician: Dr. Meade Maw primary Dr. Lance Coon hospitalist Cardiologist Clayborn Bigness  Wesley Newton is an 85 y.o. male.  HPI: Patient is a 85 year old male lives at [facility] has a history of multiple medical problems including asthma BPH bronchitis COPD previous DVT emphysema hyperlipidemia idiopathic pulmonary fibrosis states of been doing reasonably well at home then started getting dyspneic short of breath weakness fatigue.  Patient denies any chest pain shortness of breath got progressively worse she was seen in emergency room was found to be in rapid atrial fibrillation which is new for him patient was placed on Cardizem to help with rate control and IV antibiotic anticoagulation and was admitted for further assessment.  The patient eventually had since converted to sinus rhythm he feels much better after some diuresis and rate control  Past Medical History:  Diagnosis Date  . Asthma   . BPH (benign prostatic hyperplasia)   . Bronchitis   . Cancer ([place])   . COPD (chronic obstructive pulmonary disease) ([place])   . DVT of leg (deep venous thrombosis) ([place])   . Elevated PSA   . Emphysema lung ([place])   . Emphysema of lung ([place])   . Hyperlipidemia   . Nocturia   . Pulmonary fibrosis ([place])   . Pulmonary fibrosis ([place])   . Skin cancer     Past Surgical History:  Procedure Laterality Date  . CATARACT EXTRACTION    . HERNIA REPAIR    . TONSILLECTOMY      Family History  Problem Relation Age of Onset  . Heart Problems Brother   . Kidney disease Neg Hx   . Prostate cancer Neg Hx     Social History:  reports that he has quit smoking. He has never used smokeless tobacco. He reports that he does not drink alcohol or use drugs.  Allergies:  Allergies  Allergen Reactions  . Amoxicillin Other (See Comments)    DID THE REACTION INVOLVE: Swelling of the face/tongue/throat, SOB, or low BP?  Unknown Sudden or severe rash/hives, skin peeling, or the inside of the mouth or nose? Unknown Did it require medical treatment? Unknown When did it last happen?Unknown If all above answers are "NO", may proceed with cephalosporin use.   . Codeine Nausea And Vomiting  . Rapaflo [Silodosin] Other (See Comments)    Reaction:  Unknown   . Tramadol Nausea And Vomiting and Other (See Comments)    Pt states that this medication makes him feel crazy.    . Trimethoprim Other (See Comments)    Reaction: unknown    Medications: I have reviewed the patient's current medications.  Results for orders placed or performed during the hospital encounter of 05/04/19 (from the past 48 hour(s))  CBC with Differential/Platelet     Status: Abnormal   Collection Time: 05/04/19 10:13 PM  Result Value Ref Range   WBC 12.4 (H) 4.0 - 10.5 K/uL   RBC 4.99 4.22 - 5.81 MIL/uL   Hemoglobin 13.7 13.0 - 17.0 g/dL   HCT 41.4 39.0 - 52.0 %   MCV 83.0 80.0 - 100.0 fL   MCH 27.5 26.0 - 34.0 pg   MCHC 33.1 30.0 - 36.0 g/dL   RDW 15.7 (H) 11.5 - 15.5 %   Platelets 328 150 - 400 K/uL   nRBC 0.0 0.0 - 0.2 %   Neutrophils Relative % 50 %   Neutro Abs 6.2 1.7 - 7.7 K/uL   Lymphocytes Relative  39 %   Lymphs Abs 4.8 (H) 0.7 - 4.0 K/uL   Monocytes Relative 11 %   Monocytes Absolute 1.3 (H) 0.1 - 1.0 K/uL   Eosinophils Relative 0 %   Eosinophils Absolute 0.1 0.0 - 0.5 K/uL   Basophils Relative 0 %   Basophils Absolute 0.0 0.0 - 0.1 K/uL   Immature Granulocytes 0 %   Abs Immature Granulocytes 0.02 0.00 - 0.07 K/uL    Comment: Performed at [facility], [address] [place], [place] [address]  Comprehensive metabolic panel     Status: Abnormal   Collection Time: 05/04/19 10:13 PM  Result Value Ref Range   Sodium 139 135 - 145 mmol/L   Potassium 4.2 3.5 - 5.1 mmol/L   Chloride 102 98 - 111 mmol/L   CO2 29 22 - 32 mmol/L   Glucose, Bld 113 (H) 70 - 99 mg/dL   BUN 23 8 - 23 mg/dL   Creatinine, Ser  1.06 0.61 - 1.24 mg/dL   Calcium 9.4 8.9 - 10.3 mg/dL   Total Protein 7.3 6.5 - 8.1 g/dL   Albumin 3.7 3.5 - 5.0 g/dL   AST 26 15 - 41 U/L   ALT 22 0 - 44 U/L   Alkaline Phosphatase 93 38 - 126 U/L   Total Bilirubin 0.5 0.3 - 1.2 mg/dL   GFR calc non Af Amer >60 >60 mL/min   GFR calc Af Amer >60 >60 mL/min   Anion gap 8 5 - 15    Comment: Performed at [facility], [address] [place], [place] [address]  Troponin I - ONCE - STAT     Status: None   Collection Time: 05/04/19 10:13 PM  Result Value Ref Range   Troponin I <0.03 <0.03 ng/mL    Comment: Performed at [facility], [address], [place], [place] [address]  SARS Coronavirus 2 (CEPHEID - Performed in [place] hospital lab), Hosp Order     Status: None   Collection Time: 05/04/19 10:13 PM  Result Value Ref Range   SARS Coronavirus 2 NEGATIVE NEGATIVE    Comment: (NOTE) If result is NEGATIVE SARS-CoV-2 target nucleic acids are NOT DETECTED. The SARS-CoV-2 RNA is generally detectable in upper and lower  respiratory specimens during the acute phase of infection. The lowest  concentration of SARS-CoV-2 viral copies this assay can detect is 250  copies / mL. A negative result does not preclude SARS-CoV-2 infection  and should not be used as the sole basis for treatment or other  patient management decisions.  A negative result may occur with  improper specimen collection / handling, submission of specimen other  than nasopharyngeal swab, presence of viral mutation(s) within the  areas targeted by this assay, and inadequate number of viral copies  (<250 copies / mL). A negative result must be combined with clinical  observations, patient history, and epidemiological information. If result is POSITIVE SARS-CoV-2 target nucleic acids are DETECTED. The SARS-CoV-2 RNA is generally detectable in upper and lower  respiratory specimens dur ing the acute phase of infection.  Positive  results are  indicative of active infection with SARS-CoV-2.  Clinical  correlation with patient history and other diagnostic information is  necessary to determine patient infection status.  Positive results do  not rule out bacterial infection or co-infection with other viruses. If result is PRESUMPTIVE POSTIVE SARS-CoV-2 nucleic acids MAY BE PRESENT.   A presumptive positive result was obtained on the submitted specimen  and  confirmed on repeat testing.  While 2019 novel coronavirus  (SARS-CoV-2) nucleic acids may be present in the submitted sample  additional confirmatory testing may be necessary for epidemiological  and / or clinical management purposes  to differentiate between  SARS-CoV-2 and other Sarbecovirus currently known to infect humans.  If clinically indicated additional testing with an alternate test  methodology [phone]) is advised. The SARS-CoV-2 RNA is generally  detectable in upper and lower respiratory sp ecimens during the acute  phase of infection. The expected result is Negative. Fact Sheet for Patients:  StrictlyIdeas.no Fact Sheet for Healthcare Providers: BankingDealers.co.za This test is not yet approved or cleared by the [place] FDA and has been authorized for detection and/or diagnosis of SARS-CoV-2 by FDA under an Emergency Use Authorization (EUA).  This EUA will remain in effect (meaning this test can be used) for the duration of the COVID-19 declaration under Section 564(b)(1) of the Act, 21 U.S.C. section 360bbb-3(b)(1), unless the authorization is terminated or revoked sooner. Performed at [facility], [address] [place], [place] [address]     Dg Chest Portable 1 View  Result Date: 05/04/2019 CLINICAL DATA:  Shortness of breath and palpitations EXAM: PORTABLE CHEST 1 VIEW COMPARISON:  03/03/2019 FINDINGS: Extensive interstitial opacity throughout both lungs is unchanged and consistent known  interstitial lung disease. There is no area of confluent consolidation. No pleural effusion or pneumothorax. Normal cardiomediastinal contours. IMPRESSION: Chronic interstitial lung disease without acute consolidation. Electronically Signed   By: Ulyses Jarred M.D.   On: 05/04/2019 22:34    Review of Systems  Constitutional: Positive for diaphoresis and malaise/fatigue.  HENT: Positive for congestion.   Eyes: Negative.   Respiratory: Positive for cough and shortness of breath.   Cardiovascular: Positive for palpitations, orthopnea and PND.  Gastrointestinal: Negative.   Genitourinary: Negative.   Musculoskeletal: Negative.   Neurological: Positive for weakness.  Endo/Heme/Allergies: Negative.   Psychiatric/Behavioral: Negative.    Blood pressure (!) 96/51, pulse 71, temperature 98.4 F (36.9 C), temperature source Oral, resp. rate 16, height 5\' 6"  (1.676 m), weight 68 kg, SpO2 97 %. Physical Exam  Nursing note and vitals reviewed. Constitutional: He is oriented to person, place, and time. He appears well-developed and well-nourished.  HENT:  Head: Normocephalic and atraumatic.  Eyes: Pupils are equal, round, and reactive to light. Conjunctivae and EOM are normal.  Neck: Normal range of motion. Neck supple.  Cardiovascular: Normal rate, normal heart sounds and normal pulses. An irregularly irregular rhythm present.  Respiratory: Effort normal. He has decreased breath sounds. He has rhonchi.  GI: Soft. Bowel sounds are normal.  Musculoskeletal: Normal range of motion.  Neurological: He is alert and oriented to person, place, and time. He has normal reflexes.  Skin: Skin is warm and dry.  Psychiatric: He has a normal mood and affect.    Assessment/Plan: Atrial fibrillation Shortness of breath Asthma Hyperlipidemia Bronchitis Palpitations Shortness of breath Hyperlipidemia GERD Idiopathic pulmonary fibrosis . Plan Agree with admit to telemetry Rule out myocardial  infarction Supplemental oxygen as necessary Agree with rate control for A. Fib Recommend antiarrhythmic with amiodarone p.o. Anticoagulation with Eliquis will start low at 2.5 twice a day discontinue aspirin Continue Cardizem for rate control Continue inhalers for COPD asthma Agree with simvastatin therapy for lipid management Continue conservative therapy  Raniah Karan D Zakirah Weingart 05/05/2019, 9:16 PM

## 2019-05-05 NOTE — Progress Notes (Signed)
Advanced care plan.  Purpose of the Encounter: CODE STATUS, living will and healthcare power of attorney  Parties in Attendance: Patient himself  Patient's Decision Capacity: Intact  Subjective/Patient's story: Wesley Newton  is a 85 y.o. male  history of pulmonary fibrosis, essential hypertension, COPD who presents with chief chest palpitations and shortness of breath.  Patient noticed to be in A. fib with RVR.     Objective/Medical story I discussed with the patient regarding his desires for cardiac and pulmonary resuscitation.  Patient has a DNR in place.  Also discussed regarding healthcare power of attorney and living will.  Patient states that his granddaughter Wesley Newton has healthcare power of attorney whom I contacted and updated patient's condition.   Goals of care determination: DNR   CODE STATUS: DNR   Time spent discussing advanced care planning: 16 minutes

## 2019-05-05 NOTE — Progress Notes (Signed)
Patient in NSR, MD/cardio notified.

## 2019-05-05 NOTE — Progress Notes (Signed)
Pt feels stuffy.  Requesting nasal spray.  Looked at home med list; he takes Astelin.  Paged on-call MD for an order.

## 2019-05-05 NOTE — ED Notes (Signed)
Conducted home medication history interview via phone with patient's daughter Ivin Booty) who reports being the primary caregiver (although not POA). During the phone call, she reported that patient's urologist had recently increased FLOMAX to 0.8mg  daily and left FINASTERIDE at 5mg  daily. She inquired why patient would need to be on both medications since they address the same conditions. I informed her that I could not speak to the exact rationale, but that many people are prescribed both medications so it was not uncommon. I offered to make her inquiry known to the treatment team so that the medications could be reviewed and that a better and more thorough explanation could be offered.   Additionally, the daughter reports concern that the patient will mismanage his medications upon discharge and returning to [facility]. According to the daughter, he lives by himself in an independent living apartment, but visitation restrictions imposed due to SARS-CoV-2/COVID-19 have prevented her from coming onto the campus to organize and manage his medications. As a result, the patient is reported as having erroneously taken #2 finasteride tablets instead of #2 tamsulosin for a period of time before being corrected. The daughter also reports patient expressing anxiety at being isolated and unable to have visitors or family.   Patient's daughter requested that hospitalist or member of treatment team provide some form of document that would allow her to come onto the campus to assist with patient's medication management. I informed her that I would make her request known, but that I could offer no guarantees that such a document would be possible or accepted by [facility]. She expressed sincere gratitude for the effort and requested she be kept informed of patient's progress.  ** The above is intended solely for informational and/or communicative purposes. It should in no way be considered an endorsement of any specific  treatment, therapy or action. **

## 2019-05-05 NOTE — Progress Notes (Signed)
Patient's last two blood pressures have been soft, but the MAP is stable.  This RN contacted on-call MD for maintenance fluids.  On-call MD placed orders for NS @ 100 cc/hr.  Since patient has no history of CHF, a 500 cc bolus was also ordered.  This RN implemented the orders and started a second IV.  Will continue to monitor.

## 2019-05-05 NOTE — Plan of Care (Signed)
Patient converted to NSR, BP improved with midodrine, ambulating to bathroom with standby assistance, tolerated well.   Problem: Education: Goal: Knowledge of General Education information will improve Description Including pain rating scale, medication(s)/side effects and non-pharmacologic comfort measures Outcome: Progressing   Problem: Clinical Measurements: Goal: Ability to maintain clinical measurements within normal limits will improve Outcome: Progressing   Problem: Clinical Measurements: Goal: Cardiovascular complication will be avoided Outcome: Progressing   Problem: Activity: Goal: Risk for activity intolerance will decrease Outcome: Progressing   Problem: Nutrition: Goal: Adequate nutrition will be maintained Outcome: Progressing   Problem: Education: Goal: Knowledge of disease or condition will improve Outcome: Progressing

## 2019-05-06 DIAGNOSIS — R1319 Other dysphagia: Secondary | ICD-10-CM

## 2019-05-06 DIAGNOSIS — Z6822 Body mass index (BMI) 22.0-22.9, adult: Secondary | ICD-10-CM | POA: Diagnosis not present

## 2019-05-06 DIAGNOSIS — Z85828 Personal history of other malignant neoplasm of skin: Secondary | ICD-10-CM | POA: Diagnosis not present

## 2019-05-06 DIAGNOSIS — Z7951 Long term (current) use of inhaled steroids: Secondary | ICD-10-CM | POA: Diagnosis not present

## 2019-05-06 DIAGNOSIS — Z79899 Other long term (current) drug therapy: Secondary | ICD-10-CM | POA: Diagnosis not present

## 2019-05-06 DIAGNOSIS — Z87891 Personal history of nicotine dependence: Secondary | ICD-10-CM | POA: Diagnosis not present

## 2019-05-06 DIAGNOSIS — J84112 Idiopathic pulmonary fibrosis: Secondary | ICD-10-CM | POA: Diagnosis present

## 2019-05-06 DIAGNOSIS — Z7982 Long term (current) use of aspirin: Secondary | ICD-10-CM | POA: Diagnosis not present

## 2019-05-06 DIAGNOSIS — I10 Essential (primary) hypertension: Secondary | ICD-10-CM | POA: Diagnosis present

## 2019-05-06 DIAGNOSIS — N401 Enlarged prostate with lower urinary tract symptoms: Secondary | ICD-10-CM | POA: Diagnosis present

## 2019-05-06 DIAGNOSIS — E7800 Pure hypercholesterolemia, unspecified: Secondary | ICD-10-CM | POA: Diagnosis present

## 2019-05-06 DIAGNOSIS — J9610 Chronic respiratory failure, unspecified whether with hypoxia or hypercapnia: Secondary | ICD-10-CM | POA: Diagnosis present

## 2019-05-06 DIAGNOSIS — K224 Dyskinesia of esophagus: Secondary | ICD-10-CM | POA: Diagnosis present

## 2019-05-06 DIAGNOSIS — Z8249 Family history of ischemic heart disease and other diseases of the circulatory system: Secondary | ICD-10-CM | POA: Diagnosis not present

## 2019-05-06 DIAGNOSIS — I951 Orthostatic hypotension: Secondary | ICD-10-CM | POA: Diagnosis present

## 2019-05-06 DIAGNOSIS — Z86718 Personal history of other venous thrombosis and embolism: Secondary | ICD-10-CM | POA: Diagnosis not present

## 2019-05-06 DIAGNOSIS — K219 Gastro-esophageal reflux disease without esophagitis: Secondary | ICD-10-CM | POA: Diagnosis present

## 2019-05-06 DIAGNOSIS — E43 Unspecified severe protein-calorie malnutrition: Secondary | ICD-10-CM | POA: Diagnosis present

## 2019-05-06 DIAGNOSIS — Z20828 Contact with and (suspected) exposure to other viral communicable diseases: Secondary | ICD-10-CM | POA: Diagnosis present

## 2019-05-06 DIAGNOSIS — E785 Hyperlipidemia, unspecified: Secondary | ICD-10-CM | POA: Diagnosis present

## 2019-05-06 DIAGNOSIS — I4891 Unspecified atrial fibrillation: Secondary | ICD-10-CM | POA: Diagnosis present

## 2019-05-06 DIAGNOSIS — Z66 Do not resuscitate: Secondary | ICD-10-CM | POA: Diagnosis present

## 2019-05-06 DIAGNOSIS — R351 Nocturia: Secondary | ICD-10-CM | POA: Diagnosis present

## 2019-05-06 LAB — MRSA PCR SCREENING: MRSA by PCR: NEGATIVE

## 2019-05-06 MED ORDER — ENSURE ENLIVE PO LIQD
237.0000 mL | Freq: Two times a day (BID) | ORAL | Status: DC
  Administered 2019-05-06 – 2019-05-07 (×2): 237 mL via ORAL

## 2019-05-06 MED ORDER — ADULT MULTIVITAMIN W/MINERALS CH
1.0000 | ORAL_TABLET | Freq: Every day | ORAL | Status: DC
  Administered 2019-05-07: 1 via ORAL
  Filled 2019-05-06: qty 1

## 2019-05-06 MED ORDER — SODIUM CHLORIDE 0.9% FLUSH
10.0000 mL | Freq: Two times a day (BID) | INTRAVENOUS | Status: DC
  Administered 2019-05-06 – 2019-05-07 (×2): 10 mL via INTRAVENOUS

## 2019-05-06 MED ORDER — GLUCAGON HCL RDNA (DIAGNOSTIC) 1 MG IJ SOLR: 1.0000 mg | Freq: Once | INTRAMUSCULAR | Status: DC

## 2019-05-06 MED ORDER — MIDODRINE HCL 5 MG PO TABS
10.0000 mg | ORAL_TABLET | Freq: Three times a day (TID) | ORAL | Status: DC
  Administered 2019-05-06 – 2019-05-07 (×3): 10 mg via ORAL
  Filled 2019-05-06 (×3): qty 2

## 2019-05-06 MED ORDER — GLUCAGON HCL RDNA (DIAGNOSTIC) 1 MG IJ SOLR
0.5000 mg | Freq: Once | INTRAMUSCULAR | Status: AC
  Administered 2019-05-06: 0.5 mg via INTRAVENOUS
  Filled 2019-05-06: qty 1

## 2019-05-06 NOTE — Evaluation (Signed)
Clinical/Bedside Swallow Evaluation Patient Details  Name: STANLY SI MRN: [id number] Date of Birth: Sep 16, 1927  Today's Date: 05/06/2019 Time: SLP Start Time (ACUTE ONLY): 0930 SLP Stop Time (ACUTE ONLY): 1045 SLP Time Calculation (min) (ACUTE ONLY): 75 min  Past Medical History:  Past Medical History:  Diagnosis Date  . Asthma   . BPH (benign prostatic hyperplasia)   . Bronchitis   . Cancer ([place])   . COPD (chronic obstructive pulmonary disease) ([place])   . DVT of leg (deep venous thrombosis) ([place])   . Elevated PSA   . Emphysema lung ([place])   . Emphysema of lung ([place])   . Hyperlipidemia   . Nocturia   . Pulmonary fibrosis ([place])   . Pulmonary fibrosis ([place])   . Skin cancer    Past Surgical History:  Past Surgical History:  Procedure Laterality Date  . CATARACT EXTRACTION    . HERNIA REPAIR    . TONSILLECTOMY     HPI:  Per admitting H&P: Haniel Fix  is a 85 y.o. male who presents with chief complaint as above.  Patient presents the ED with a complaint of chest palpitations and some mild associated shortness of breath.  Here he is found to be in A. fib with RVR.  He has no prior history of this per his recollection or that can be found on chart review.  He was given a couple of doses of Cardizem in the ED and then placed on a Cardizem drip.  He had moderate response with some reduction in heart rate.  Hospitalist called for admission.   Assessment / Plan / Recommendation Clinical Impression  This very pleasant 85 y/o male presents for bedside evaluation due to c/o of globus, difficulty swallowing his pills and some difficulty swallowing when eating certain foods (mixed consistencies).  Pt reports he frequently belches and burps when he eats and feels full "early in a meal". Pt presents with a chronic low throat clear throughout evaluation due to pt report of chronic thick mucous he needs to clear. Upon evaluation, oral phase of swallow appears to be [facility] demonstrating efficient  and effective bolus management and A-P propulsion. Oral mech exam WFL. Pharyngeal phase appears WFL at bedside. Last MBSS on 08/08/2018 rec'd continue w/regular diet w/thin liquids. MBSS revealed transient laryngeal penetration w/thin liquids, a mild swallow delay and did not appear to be a risk for prandial aspiration. However "likely aspiration was observed with thin liquid when given a PILL ONLY," which suggests increased risk of aspiration w/mixed consistencies (solid & a liquid).  This is consistent w/pt recent report of coughing while attempting to eat vegetable soup. No overt s/s aspiration observed at bedside w/trials of thin liquid, puree and solid. However did observe consistent belching after every couple bites/sips of liquid and pt reported feelings of globus and food sticking pointing at his sternum, which is most consistent with s/s ESOPHAGEAL DYSPHAGIA.   Rec. GI CONSULT to further investigate and manage esophageal deficits. Rec. Dysphagia III diet w/thin liquids w/strict aspiration precautions and the following safe swallow rec's: NO MIXED CONSISTENCIES, ALTERNATE SMALL bite with SMALL sip thin liquid, eat slowly, eat small frequent meals throughout the day to facilitate esophageal clearance. Rec pt sit fully upright for all PO intake and remain upright for up to 90 minutes after a meal to decrease risk of aspiration from possible retrograde flow/reflux. SLP to f/u with continued education re: safe swallow rec's and will f/u w/toleration of diet. Pt and nsg educated on results  and recommendations mentioned above, both stated understanding and agreement.  SLP Visit Diagnosis: Dysphagia, unspecified (R13.10)    Aspiration Risk  Mild aspiration risk;Risk for inadequate nutrition/hydration    Diet Recommendation Dysphagia 3 (Mech soft);Thin liquid(NO MIXED CONSISTENCIES)   Liquid Administration via: Cup;No straw Medication Administration: Crushed with puree Supervision: Patient able to self  feed;Intermittent supervision to cue for compensatory strategies Compensations: Minimize environmental distractions;Slow rate;Small sips/bites;Follow solids with liquid Postural Changes: Seated upright at 90 degrees;Remain upright for at least 30 minutes after po intake    Other  Recommendations Recommended Consults: Consider GI evaluation;Consider esophageal assessment Oral Care Recommendations: Oral care before and after PO Other Recommendations: Clarify dietary restrictions   Follow up Recommendations None      Frequency and Duration min 1 x/week  1 week       Prognosis Prognosis for Safe Diet Advancement: Good      Swallow Study   General Date of Onset: 05/06/19 HPI: Per admitting H&P: Sheppard Luckenbach  is a 85 y.o. male who presents with chief complaint as above.  Patient presents the ED with a complaint of chest palpitations and some mild associated shortness of breath.  Here he is found to be in A. fib with RVR.  He has no prior history of this per his recollection or that can be found on chart review.  He was given a couple of doses of Cardizem in the ED and then placed on a Cardizem drip.  He had moderate response with some reduction in heart rate.  Hospitalist called for admission. Type of Study: Bedside Swallow Evaluation Previous Swallow Assessment: (MBSS 08/08/18 rec. cont w/reg diet) Diet Prior to this Study: NPO Temperature Spikes Noted: No Respiratory Status: Room air History of Recent Intubation: No Behavior/Cognition: Alert;Cooperative;Pleasant mood Oral Cavity Assessment: Within Functional Limits Oral Care Completed by SLP: No Oral Cavity - Dentition: Adequate natural dentition Vision: Functional for self-feeding Self-Feeding Abilities: Able to feed self Patient Positioning: Upright in bed Baseline Vocal Quality: Hoarse(chronic throat clear, pt reports due to thick mucous ) Volitional Cough: Strong Volitional Swallow: Able to elicit    Oral/Motor/Sensory  Function Overall Oral Motor/Sensory Function: Within functional limits   Ice Chips Ice chips: Within functional limits   Thin Liquid Thin Liquid: Within functional limits    Nectar Thick Nectar Thick Liquid: Not tested   Honey Thick Honey Thick Liquid: Not tested   Puree Puree: Within functional limits   Solid     Solid: Within functional limits      Ariann Khaimov, MA, CCC-SLP 05/06/2019,12:00 PM

## 2019-05-06 NOTE — Progress Notes (Signed)
Patient c/o the sensation that his previously administered pills are stuck in his throat.  Patient able to speak in full sentences and is able to cough, occasionally bringing up phlegm.  This RN gave the patient applesauce and honey-consistency thickened water to see if this would help.  The patient still has the same sensation.  Also states he occasionally has trouble swallowing and had a clean scope recently.  Will make patient NPO and put in a SLP consult.  This RN paged the on-call MD for guidance.

## 2019-05-06 NOTE — Evaluation (Signed)
Physical Therapy Evaluation Patient Details Name: Wesley Newton MRN: [id number] DOB: 02/22/1927 Today's Date: 05/06/2019   History of Present Illness  presented to ER secondary to chest palpitations, SOB; admitted with afib with RVR, requiring cardizem drip at admission (discontinued due to hypotension)  Clinical Impression  Upon evaluation, patient alert and oriented; follows commands and demonstrates good effort with all mobility tasks.  Bilat UE/LE strength and ROM grossly symmetrical and WFL; generally weak and deconditioned due to acute hospitalization.  Currently requiring cga/min assist for sit/stand and standing balance with RW; increased sway in A/P plan requiring hands-on assist to maintain balance.  Significant symptomatic orthostasis noted with transition to upright; additional gait/mobility deferred as result.  Will continue to assess/progress in subsequent sessions as appropriate. Would benefit from skilled PT to address above deficits and promote optimal return to PLOF; recommend transition to STR upon discharge from acute hospitalization.  Patient lacks balance, functional endurance required to facilitate safe discharge home alone at this time.       Follow Up Recommendations SNF    Equipment Recommendations  Rolling walker with 5" wheels    Recommendations for Other Services       Precautions / Restrictions Precautions Precautions: Fall Restrictions Weight Bearing Restrictions: No      Mobility  Bed Mobility               General bed mobility comments: seated in recliner beginning/end of treatment session  Transfers Overall transfer level: Needs assistance Equipment used: Rolling walker (2 wheeled) Transfers: Sit to/from Stand Sit to Stand: Min guard;Min assist         General transfer comment: slow and guarded, requiring bilat UEs to assist with movement transition  Ambulation/Gait             General Gait Details: unsafe/unable to  attempt due to symptomatic orthostasis  Stairs            Wheelchair Mobility    Modified Rankin (Stroke Patients Only)       Balance Overall balance assessment: Needs assistance Sitting-balance support: No upper extremity supported;Feet supported Sitting balance-Leahy Scale: Good     Standing balance support: No upper extremity supported Standing balance-Leahy Scale: Poor Standing balance comment: increased sway in A/P plane, min assist to maintain balance throughout                             Pertinent Vitals/Pain Pain Assessment: No/denies pain    Home Living Family/patient expects to be discharged to:: Assisted living               Home Equipment: None(borrowed SPC from neighbor) Additional Comments: lives at [facility] independent apartments    Prior Function Level of Independence: Independent         Comments: Pt drives, is regularly out of the home, will do stairs (not elevator) for exercises when feeling good.  Denies fall history; no home O2.     Hand Dominance        Extremity/Trunk Assessment   Upper Extremity Assessment Upper Extremity Assessment: Overall WFL for tasks assessed    Lower Extremity Assessment Lower Extremity Assessment: Generalized weakness(grossly 4-/5 throughout)       Communication   Communication: No difficulties  Cognition Arousal/Alertness: Awake/alert Behavior During Therapy: WFL for tasks assessed/performed Overall Cognitive Status: Within Functional Limits for tasks assessed  General Comments      Exercises Other Exercises Other Exercises: Orthostatic assessment as noted above; significant symptomatic orthostasis with transition to upright (see vitals flowsheet for details) Other Exercises: Standing UE/LE therex, 1x10, with RW, cga/min assist: LE marching, bilat UE horiz abduct/adduct. Increased sway with dynamic activitis, min assist  to maintain balance.   Assessment/Plan    PT Assessment Patient needs continued PT services  PT Problem List Decreased strength;Decreased activity tolerance;Decreased balance;Decreased mobility;Cardiopulmonary status limiting activity       PT Treatment Interventions DME instruction;Gait training;Functional mobility training;Therapeutic activities;Therapeutic exercise;Balance training;Patient/family education    PT Goals (Current goals can be found in the Care Plan section)  Acute Rehab PT Goals Patient Stated Goal: to go to healthcare for a while before I go back by myself PT Goal Formulation: With patient Time For Goal Achievement: 05/20/19 Potential to Achieve Goals: Good    Frequency Min 2X/week   Barriers to discharge        Co-evaluation               AM-PAC PT "6 Clicks" Mobility  Outcome Measure Help needed turning from your back to your side while in a flat bed without using bedrails?: A Little Help needed moving from lying on your back to sitting on the side of a flat bed without using bedrails?: A Little Help needed moving to and from a bed to a chair (including a wheelchair)?: A Little Help needed standing up from a chair using your arms (e.g., wheelchair or bedside chair)?: A Little Help needed to walk in hospital room?: A Little Help needed climbing 3-5 steps with a railing? : A Little 6 Click Score: 18    End of Session Equipment Utilized During Treatment: Gait belt Activity Tolerance: Patient tolerated treatment well Patient left: in chair;with call bell/phone within reach;with chair alarm set Nurse Communication: Mobility status PT Visit Diagnosis: Muscle weakness (generalized) (M62.81);Difficulty in walking, not elsewhere classified (R26.2)    Time: 7972-8206 PT Time Calculation (min) (ACUTE ONLY): 35 min   Charges:   PT Evaluation $PT Eval Moderate Complexity: 1 Mod PT Treatments $Therapeutic Activity: 8-22 mins      Tawanda Schall H. Owens Shark, PT,  DPT, NCS 05/06/19, 2:39 PM [phone]

## 2019-05-06 NOTE — Progress Notes (Signed)
Patient given 0.5mg  of glucagon and zofran to help with esophageal relaxation and subsequent nausea.  At this current time, patient states that he feels a little better and relaxed enough to sleep.

## 2019-05-06 NOTE — Progress Notes (Signed)
[place] [facility]                                                                                                                                                                                  Patient Demographics   Wesley Newton, is a 85 y.o. male, DOB - 1927-05-12, TZG:[id number]  Admit date - 05/04/2019   Admitting Physician Lance Coon, MD  Outpatient Primary MD for the patient is Baxter Hire, MD   LOS - 0  Subjective: Patient states that now he is having difficulty swallowing. And feeling weak   Review of Systems:   CONSTITUTIONAL: No documented fever. No fatigue, weakness. No weight gain, no weight loss.  EYES: No blurry or double vision.  ENT: No tinnitus. No postnasal drip. No redness of the oropharynx.  RESPIRATORY: Positive cough, no wheeze, no hemoptysis.  Positive dyspnea.  CARDIOVASCULAR: No chest pain. No orthopnea. No palpitations. No syncope.  GASTROINTESTINAL: No nausea, no vomiting or diarrhea. No abdominal pain. No melena or hematochezia.  Positive dysphasia GENITOURINARY: No dysuria or hematuria.  ENDOCRINE: No polyuria or nocturia. No heat or cold intolerance.  HEMATOLOGY: No anemia. No bruising. No bleeding.  INTEGUMENTARY: No rashes. No lesions.  MUSCULOSKELETAL: No arthritis. No swelling. No gout.  NEUROLOGIC: No numbness, tingling, or ataxia. No seizure-type activity.  PSYCHIATRIC: No anxiety. No insomnia. No ADD.    Vitals:   Vitals:   05/06/19 0350 05/06/19 0810 05/06/19 0817 05/06/19 1023  BP: 123/61 128/71 115/72 128/61  Pulse: 75 86 82 81  Resp:      Temp: 98.3 F (36.8 C) 97.6 F (36.4 C) 97.9 F (36.6 C)   TempSrc: Oral Oral Oral   SpO2: 96% 95% 94%   Weight: 64.5 kg     Height:        Wt Readings from Last 3 Encounters:  05/06/19 64.5 kg  03/13/19 66.7 kg  03/05/19 65.1 kg     Intake/Output Summary (Last 24 hours) at 05/06/2019 1244 Last data filed at 05/06/2019 7591 Gross per 24 hour   Intake -  Output 2225 ml  Net -2225 ml    Physical Exam:   GENERAL: Pleasant-appearing in no apparent distress.  HEAD, EYES, EARS, NOSE AND THROAT: Atraumatic, normocephalic. Extraocular muscles are intact. Pupils equal and reactive to light. Sclerae anicteric. No conjunctival injection. No oro-pharyngeal erythema.  NECK: Supple. There is no jugular venous distention. No bruits, no lymphadenopathy, no thyromegaly.  HEART: Irregularly irregular,. No murmurs, no rubs, no clicks.  LUNGS: Clear to auscultation bilaterally. No rales or rhonchi. No wheezes.  ABDOMEN: Soft, flat, nontender, nondistended. Has good bowel sounds. No hepatosplenomegaly appreciated.  EXTREMITIES:  No evidence of any cyanosis, clubbing, or peripheral edema.  +2 pedal and radial pulses bilaterally.  NEUROLOGIC: The patient is alert, awake, and oriented x3 with no focal motor or sensory deficits appreciated bilaterally.  SKIN: Moist and warm with no rashes appreciated.  Psych: Not anxious, depressed LN: No inguinal LN enlargement    Antibiotics   Anti-infectives (From admission, onward)   None      Medications   Scheduled Meds: . amiodarone  200 mg Oral BID  . apixaban  2.5 mg Oral BID  . atorvastatin  20 mg Oral q1800  . azelastine  1 spray Each Nare Daily  . budesonide  0.5 mg Nebulization Daily  . docusate sodium  100 mg Oral Daily  . feeding supplement (ENSURE ENLIVE)  237 mL Oral BID BM  . finasteride  5 mg Oral Daily  . guaiFENesin  600 mg Oral BID  . levalbuterol  1.25 mg Nebulization Q6H  . midodrine  5 mg Oral TID WC  . [START ON 05/07/2019] multivitamin with minerals  1 tablet Oral Daily  . pantoprazole  40 mg Oral Daily  . tamsulosin  0.8 mg Oral Daily   Continuous Infusions: . sodium chloride 10 mL/hr at 05/05/19 0044  . diltiazem (CARDIZEM) infusion Stopped (05/05/19 0510)   PRN Meds:.sodium chloride, acetaminophen **OR** acetaminophen, LORazepam, ondansetron **OR** ondansetron (ZOFRAN)  IV   Data Review:   Micro Results Recent Results (from the past 240 hour(s))  SARS Coronavirus 2 (CEPHEID - Performed in [facility] hospital lab), Hosp Order     Status: None   Collection Time: 05/04/19 10:13 PM  Result Value Ref Range Status   SARS Coronavirus 2 NEGATIVE NEGATIVE Final    Comment: (NOTE) If result is NEGATIVE SARS-CoV-2 target nucleic acids are NOT DETECTED. The SARS-CoV-2 RNA is generally detectable in upper and lower  respiratory specimens during the acute phase of infection. The lowest  concentration of SARS-CoV-2 viral copies this assay can detect is 250  copies / mL. A negative result does not preclude SARS-CoV-2 infection  and should not be used as the sole basis for treatment or other  patient management decisions.  A negative result may occur with  improper specimen collection / handling, submission of specimen other  than nasopharyngeal swab, presence of viral mutation(s) within the  areas targeted by this assay, and inadequate number of viral copies  (<250 copies / mL). A negative result must be combined with clinical  observations, patient history, and epidemiological information. If result is POSITIVE SARS-CoV-2 target nucleic acids are DETECTED. The SARS-CoV-2 RNA is generally detectable in upper and lower  respiratory specimens dur ing the acute phase of infection.  Positive  results are indicative of active infection with SARS-CoV-2.  Clinical  correlation with patient history and other diagnostic information is  necessary to determine patient infection status.  Positive results do  not rule out bacterial infection or co-infection with other viruses. If result is PRESUMPTIVE POSTIVE SARS-CoV-2 nucleic acids MAY BE PRESENT.   A presumptive positive result was obtained on the submitted specimen  and confirmed on repeat testing.  While 2019 novel coronavirus  (SARS-CoV-2) nucleic acids may be present in the submitted sample  additional  confirmatory testing may be necessary for epidemiological  and / or clinical management purposes  to differentiate between  SARS-CoV-2 and other Sarbecovirus currently known to infect humans.  If clinically indicated additional testing with an alternate test  methodology [phone]) is advised. The SARS-CoV-2 RNA is generally  detectable in upper and lower respiratory sp ecimens during the acute  phase of infection. The expected result is Negative. Fact Sheet for Patients:  StrictlyIdeas.no Fact Sheet for Healthcare Providers: BankingDealers.co.za This test is not yet approved or cleared by the [place] FDA and has been authorized for detection and/or diagnosis of SARS-CoV-2 by FDA under an Emergency Use Authorization (EUA).  This EUA will remain in effect (meaning this test can be used) for the duration of the COVID-19 declaration under Section 564(b)(1) of the Act, 21 U.S.C. section 360bbb-3(b)(1), unless the authorization is terminated or revoked sooner. Performed at [facility], [address] [place], [place] [address]     Radiology Reports Dg Chest Portable 1 View  Result Date: 05/04/2019 CLINICAL DATA:  Shortness of breath and palpitations EXAM: PORTABLE CHEST 1 VIEW COMPARISON:  03/03/2019 FINDINGS: Extensive interstitial opacity throughout both lungs is unchanged and consistent known interstitial lung disease. There is no area of confluent consolidation. No pleural effusion or pneumothorax. Normal cardiomediastinal contours. IMPRESSION: Chronic interstitial lung disease without acute consolidation. Electronically Signed   By: Ulyses Jarred M.D.   On: 05/04/2019 22:34     CBC Recent Labs  Lab 05/04/19 2213  WBC 12.4*  HGB 13.7  HCT 41.4  PLT 328  MCV 83.0  MCH 27.5  MCHC 33.1  RDW 15.7*  LYMPHSABS 4.8*  MONOABS 1.3*  EOSABS 0.1  BASOSABS 0.0    Chemistries  Recent Labs  Lab 05/04/19 2213  NA 139   K 4.2  CL 102  CO2 29  GLUCOSE 113*  BUN 23  CREATININE 1.06  CALCIUM 9.4  AST 26  ALT 22  ALKPHOS 93  BILITOT 0.5   ------------------------------------------------------------------------------------------------------------------ estimated creatinine clearance is 41.8 mL/min (by C-G formula based on SCr of 1.06 mg/dL). ------------------------------------------------------------------------------------------------------------------ No results for input(s): HGBA1C in the last 72 hours. ------------------------------------------------------------------------------------------------------------------ No results for input(s): CHOL, HDL, LDLCALC, TRIG, CHOLHDL, LDLDIRECT in the last 72 hours. ------------------------------------------------------------------------------------------------------------------ No results for input(s): TSH, T4TOTAL, T3FREE, THYROIDAB in the last 72 hours.  Invalid input(s): FREET3 ------------------------------------------------------------------------------------------------------------------ No results for input(s): VITAMINB12, FOLATE, FERRITIN, TIBC, IRON, RETICCTPCT in the last 72 hours.  Coagulation profile No results for input(s): INR, PROTIME in the last 168 hours.  No results for input(s): DDIMER in the last 72 hours.  Cardiac Enzymes Recent Labs  Lab 05/04/19 2213  TROPONINI <0.03   ------------------------------------------------------------------------------------------------------------------ Invalid input(s): POCBNP    Assessment & Plan  Patient is 85 year old with pulmonary fibrosis presenting with shortness of breath palpitations  Dysphasia patient seen in consultation by speech that have modified his diet.  GI has been consulted  Atrial fibrillation with RVR ([place]) -patient started on amiodarone per cardiology, as well as Eliquis  Chronic shortness of breath due to pulmonary fibrosis continue nebulizer therapy patient states  that he uses nebs 5 times a day I will place him on Xopenex nebs due to elevated heart rate  HTN (hypertension) -blood pressure was low now better  BPH (benign prostatic hyperplasia) -continue home meds  GERD (gastroesophageal reflux disease) -home dose PPI  Hypercholesterolemia -continue home dose antilipid      Code Status Orders  (From admission, onward)         Start     Ordered   05/05/19 0034  Do not attempt resuscitation (DNR)  Continuous    Question Answer Comment  In the event of cardiac or respiratory ARREST Do not call a "code blue"   In the event of cardiac or respiratory  ARREST Do not perform Intubation, CPR, defibrillation or ACLS   In the event of cardiac or respiratory ARREST Use medication by any route, position, wound care, and other measures to relive pain and suffering. May use oxygen, suction and manual treatment of airway obstruction as needed for comfort.      05/05/19 0033        Code Status History    Date Active Date Inactive Code Status Order ID Comments User Context   03/03/2019 2208 03/05/2019 1734 DNR [id number]  Arta Silence, MD Inpatient   12/19/2018 0234 12/21/2018 1734 Full Code [id number]  Lance Coon, MD Inpatient    Advance Directive Documentation     Most Recent Value  Type of Advance Directive  Healthcare Power of [place], Out of facility DNR (pink MOST or yellow form)  Pre-existing out of facility DNR order (yellow form or pink MOST form)  Yellow form placed in chart (order not valid for inpatient use)  "MOST" Form in Place?  -           Consults   cardiology DVT Prophylaxis  Lovenox   Lab Results  Component Value Date   PLT 328 05/04/2019     Time Spent in minutes   47min Greater than 50% of time spent in care coordination and counseling patient regarding the condition and plan of care.   Dustin Flock M.D on 05/06/2019 at 12:44 PM  Between 7am to 6pm - Pager - [phone]  After 6pm go to [email or web address] -  Proofreader  [employer]   Office  [phone]

## 2019-05-06 NOTE — Progress Notes (Addendum)
Initial Nutrition Assessment  DOCUMENTATION CODES:   Not applicable  INTERVENTION:   Ensure Enlive po BID, each supplement provides 350 kcal and 20 grams of protein  MVI daily   NUTRITION DIAGNOSIS:   Unintentional weight loss related to chronic illness(pulmonary fibrosis ) as evidenced by 9 percent weight loss in < 6 months.  GOAL:   Patient will meet greater than or equal to 90% of their needs  MONITOR:   PO intake, Supplement acceptance, Labs, Weight trends, Skin, I & O's  REASON FOR ASSESSMENT:   Malnutrition Screening Tool    ASSESSMENT:   85 y.o.male history of pulmonary fibrosis, essential hypertension, COPD who presents with chief chest palpitations and shortness of breath.  Patient noticed to be in A. fib with RVR.  RD working remotely.  Pt familiar to this RD from recent previous admit; pt last seen by this RD in 12/2018. Pt currently NPO pending SLP evaluation after having difficulty swallowing pills. Per chart review, pt with 14lb(9%) weight loss in < 6 months which is significant. RD will order supplements pending SLP recommendations. Pt with increased estimated needs secondary to pulmonary sarcoidosis/COPD.   Pt previously diagnosed with moderate malnutrition during last admit. Pt at high risk for developing severe malnutrition but unable to diagnose at this time as NFPE can not be performed.   Medications reviewed and include: colace, protonix  Labs reviewed: wbc- 12.4(H)  Unable to complete Nutrition-Focused physical exam at this time.   Diet Order:   Diet Order            Diet NPO time specified  Diet effective now             EDUCATION NEEDS:   No education needs have been identified at this time  Skin:  Skin Assessment: Reviewed RN Assessment  Last BM:  5/15  Height:   Ht Readings from Last 1 Encounters:  05/04/19 5\' 6"  (1.676 m)    Weight:   Wt Readings from Last 1 Encounters:  05/06/19 64.5 kg    Ideal Body Weight:  64.5  kg  BMI:  Body mass index is 22.94 kg/m.  Estimated Nutritional Needs:   Kcal:  1700-1900kcal/day   Protein:  85-95g/day   Fluid:  >1.6L/day   Koleen Distance MS, RD, LDN Pager #- [phone] Office#- [phone] After Hours Pager: [phone]

## 2019-05-06 NOTE — Consult Note (Signed)
[name], MD [address] [place], [place], [place], [address] [address], [place], [place], [place], [address] Phone: [phone]  Fax: [phone]  Consultation  Referring Provider:     Dr. Posey Pronto Primary Care Physician:  Baxter Hire, MD Reason for Consultation:    Dysphagia  Date of Admission:  05/04/2019 Date of Consultation:  05/06/2019         HPI:   Wesley Newton is a 85 y.o. male with history of pulmonary fibrosis presented with shortness of breath, and admitted with A. fib with RVR.  GI being consulted for dysphagia after speech pathology recommendations.  Patient is currently undergoing cardiology work-up, and was started on Eliquis yesterday for A. fib.  Also started on amiodarone.  Speech pathology note on this admission, has cleared the patient for dysphagia 3 diet with thin liquids with strict aspiration precautions.  Last modified barium swallow was done in August 2019 and impression was "Flash laryngeal penetration of the barium was seen in multiple phases of the study resulting in a cough. No residual barium was Observed."  Patient reports history of dysphagia to pills and some solids over the last year.  Feels they get stuck in his mid sternum.  No issues with soft foods such as mashed potato consistency of pudding consistency foods.  No weight loss.  No known prior upper endoscopy.     Past Medical History:  Diagnosis Date  . Asthma   . BPH (benign prostatic hyperplasia)   . Bronchitis   . Cancer ([place])   . COPD (chronic obstructive pulmonary disease) ([place])   . DVT of leg (deep venous thrombosis) ([place])   . Elevated PSA   . Emphysema lung ([place])   . Emphysema of lung ([place])   . Hyperlipidemia   . Nocturia   . Pulmonary fibrosis ([place])   . Pulmonary fibrosis ([place])   . Skin cancer     Past Surgical History:  Procedure Laterality Date  . CATARACT EXTRACTION    . HERNIA REPAIR    . TONSILLECTOMY      Prior to Admission  medications   Medication Sig Start Date End Date Taking? Authorizing Provider  albuterol (PROVENTIL HFA;VENTOLIN HFA) 108 (90 Base) MCG/ACT inhaler Inhale 2 puffs into the lungs every 6 (six) hours as needed for wheezing or shortness of breath.   Yes [provider]  aspirin EC 81 MG tablet Take 81 mg by mouth daily.    Yes [provider]  azelastine (ASTELIN) 0.1 % nasal spray Place 1 spray into both nostrils daily. 01/10/19  Yes [provider]  budesonide (PULMICORT) 0.5 MG/2ML nebulizer solution Take 0.5 mg by nebulization daily.   Yes [provider]  dextromethorphan-guaiFENesin (MUCINEX DM) 30-600 MG 12hr tablet Take 1 tablet by mouth 2 (two) times daily.   Yes [provider]  feeding supplement, ENSURE ENLIVE, (ENSURE ENLIVE) LIQD Take 237 mLs by mouth 2 (two) times daily between meals. 12/21/18  Yes Henreitta Leber, MD  finasteride (PROSCAR) 5 MG tablet Take 1 tablet (5 mg total) by mouth daily. 05/01/19  Yes McGowan, Larene Beach A, PA-C  gabapentin (NEURONTIN) 100 MG capsule Take 100-200 mg by mouth 2 (two) times daily. Take 2 capsules (200mg ) by mouth every morning and 1 capsule (100mg ) by mouth every night 02/04/19  Yes [provider]  ipratropium-albuterol (DUONEB) 0.5-2.5 (3) MG/3ML SOLN Inhale 3 mLs into the lungs 4 (four) times daily. 01/14/19  Yes [provider]  LORazepam (ATIVAN) 0.5 MG tablet Take 0.5 mg by mouth daily as needed for anxiety.    Yes [provider]  Multiple Vitamin (MULTIVITAMIN WITH MINERALS) TABS tablet Take 1 tablet by mouth daily.   Yes [provider]  naphazoline-glycerin (CLEAR EYES REDNESS) 0.012-0.2 % SOLN Place 1-2 drops into both eyes 4 (four) times daily as needed for eye irritation. 12/21/18  Yes Henreitta Leber, MD  omeprazole (PRILOSEC) 20 MG capsule Take 20 mg by mouth daily.   Yes [provider]  simvastatin (ZOCOR) 40 MG tablet Take 40 mg by mouth at bedtime.     Yes [provider]  tamsulosin (FLOMAX) 0.4 MG CAPS capsule Take 2 capsules (0.8 mg total) by mouth daily. 05/01/19  Yes McGowan, Larene Beach A, PA-C  benzonatate (TESSALON) 100 MG capsule Take 1 capsule (100 mg total) by mouth 3 (three) times daily as needed for cough. Patient not taking: Reported on 05/05/2019 12/21/18   Henreitta Leber, MD  predniSONE (STERAPRED UNI-PAK 21 TAB) 10 MG (21) TBPK tablet Take 4 tabs (40 mg) day 1, 3 tabs (30 mg) day 2, 2 tabs (20 mg) day 3, 1 tab (10 mg) day 4. Patient not taking: Reported on 05/05/2019 03/05/19   Ripley Fraise, PA    Family History  Problem Relation Age of Onset  . Heart Problems Brother   . Kidney disease Neg Hx   . Prostate cancer Neg Hx      Social History   Tobacco Use  . Smoking status: Former Research scientist (life sciences)  . Smokeless tobacco: Never Used  . Tobacco comment: quit 40 years  Substance Use Topics  . Alcohol use: No    Alcohol/week: 0.0 standard drinks  . Drug use: No    Allergies as of 05/04/2019 - Review Complete 05/04/2019  Allergen Reaction Noted  . Amoxicillin Other (See Comments) 08/12/2017  . Codeine Nausea And Vomiting 12/25/2015  . Rapaflo [silodosin] Other (See Comments) 12/25/2015  . Tramadol Nausea And Vomiting and Other (See Comments) 09/03/2015  . Trimethoprim Other (See Comments) 08/12/2017    Review of Systems:    All systems reviewed and negative except where noted in HPI.   Physical Exam:  Vital signs in last 24 hours: Vitals:   05/06/19 0350 05/06/19 0810 05/06/19 0817 05/06/19 1023  BP: 123/61 128/71 115/72 128/61  Pulse: 75 86 82 81  Resp:      Temp: 98.3 F (36.8 C) 97.6 F (36.4 C) 97.9 F (36.6 C)   TempSrc: Oral Oral Oral   SpO2: 96% 95% 94%   Weight: 64.5 kg     Height:       Last BM Date: 05/04/19(per patient) General:   Pleasant, cooperative in NAD Head:  Normocephalic and atraumatic. Eyes:   No icterus.   Conjunctiva pink. PERRLA. Ears:  Normal auditory acuity. Neck:  Supple; no  masses or thyroidomegaly Lungs: Respirations even and unlabored. Lungs clear to auscultation bilaterally.   No wheezes, crackles, or rhonchi.  Abdomen:  Soft, nondistended, nontender. Normal bowel sounds. No appreciable masses or hepatomegaly.  No rebound or guarding.  Neurologic:  Alert and oriented x3;  grossly normal neurologically. Skin:  Intact without significant lesions or rashes. Cervical Nodes:  No significant cervical adenopathy. Psych:  Alert and cooperative. Normal affect.  LAB RESULTS: Recent Labs    05/04/19 2213  WBC 12.4*  HGB 13.7  HCT 41.4  PLT 328   BMET Recent Labs    05/04/19 2213  NA 139  K  4.2  CL 102  CO2 29  GLUCOSE 113*  BUN 23  CREATININE 1.06  CALCIUM 9.4   LFT Recent Labs    05/04/19 2213  PROT 7.3  ALBUMIN 3.7  AST 26  ALT 22  ALKPHOS 93  BILITOT 0.5   PT/INR No results for input(s): LABPROT, INR in the last 72 hours.  STUDIES: Dg Chest Portable 1 View  Result Date: 05/04/2019 CLINICAL DATA:  Shortness of breath and palpitations EXAM: PORTABLE CHEST 1 VIEW COMPARISON:  03/03/2019 FINDINGS: Extensive interstitial opacity throughout both lungs is unchanged and consistent known interstitial lung disease. There is no area of confluent consolidation. No pleural effusion or pneumothorax. Normal cardiomediastinal contours. IMPRESSION: Chronic interstitial lung disease without acute consolidation. Electronically Signed   By: Ulyses Jarred M.D.   On: 05/04/2019 22:34      Impression / Plan:   Wesley Newton is a 85 y.o. y/o male with admission for shortness of breath, A. fib with RVR, but with history of pulmonary fibrosis as well, with GI consulted for dysphagia  Patient last modified barium swallow study in September 2019 reports flash laryngeal penetration in multiple phases of the study.  Given results of modified barium swallow study, oropharyngeal dysphagia is likely contributing to his problems.  Upper endoscopy in the setting of  recent A. fib with RVR, underlying pulmonary fibrosis, and given his age will be a high risk procedure for this patient.  In addition, he is now on Eliquis which was started due to the A. fib on this admission, and holding the medication may put the patient at further risks associated with A. fib itself.  Therefore, risks of the procedure would outweigh benefits at this time.  Alternative would be to proceed with upper GI study which does not require any sedation, and thus would be lower risk, and will help evaluate for any underlying strictures in the esophagus that could make an EGD necessary.  Would recommend proceeding with upper GI study in this patient for further evaluation of the esophagus.  If upper GI study is normal, no indication for EGD.  If upper GI study is abnormal, EGD can be considered after cardiology clearance.  When I asked the patient if he would like an upper endoscopy if one is needed, patient does not have an answer for me at this time and would like to think about it.  Above discussed with primary team who has ordered upper GI study and we will await the results.  Thank you for involving me in the care of this patient.      LOS: 0 days   Virgel Manifold, MD  05/06/2019, 1:49 PM

## 2019-05-07 ENCOUNTER — Inpatient Hospital Stay: Payer: Medicare Other

## 2019-05-07 MED ORDER — MIDODRINE HCL 10 MG PO TABS: 10.0000 mg | ORAL_TABLET | Freq: Three times a day (TID) | ORAL | Status: AC

## 2019-05-07 MED ORDER — LORAZEPAM 0.5 MG PO TABS: 0.5000 mg | ORAL_TABLET | Freq: Every day | ORAL | 1 refills | Status: DC | PRN

## 2019-05-07 MED ORDER — ATORVASTATIN CALCIUM 20 MG PO TABS: 20.0000 mg | ORAL_TABLET | Freq: Every day | ORAL | Status: AC

## 2019-05-07 MED ORDER — APIXABAN 2.5 MG PO TABS: 2.5000 mg | ORAL_TABLET | Freq: Two times a day (BID) | ORAL | Status: DC

## 2019-05-07 MED ORDER — PANTOPRAZOLE SODIUM 40 MG PO TBEC: 40.0000 mg | DELAYED_RELEASE_TABLET | Freq: Two times a day (BID) | ORAL | Status: DC

## 2019-05-07 MED ORDER — AMIODARONE HCL 200 MG PO TABS: 200.0000 mg | ORAL_TABLET | Freq: Two times a day (BID) | ORAL | Status: AC

## 2019-05-07 MED ORDER — GUAIFENESIN ER 600 MG PO TB12: 600.0000 mg | ORAL_TABLET | Freq: Two times a day (BID) | ORAL | Status: DC

## 2019-05-07 MED ORDER — PANTOPRAZOLE SODIUM 40 MG PO TBEC: 40.0000 mg | DELAYED_RELEASE_TABLET | Freq: Two times a day (BID) | ORAL | Status: AC

## 2019-05-07 NOTE — Progress Notes (Signed)
Wesley Newton to be D/C'd Skilled nursing facility at [facility] per MD order. Patient given discharge teaching and paperwork regarding medications, diet, follow-up appointments and activity. Patient understanding verbalized. No questions or complaints at this time. Skin condition as charted. IV and telemetry removed prior to leaving.  No further needs by Care Management/Social Work.  An After Visit Summary was printed and given to the patient. Additional copy to be given to daughter at patient's request.  Patient to be escorted by EMS when available.  Terrilyn Saver

## 2019-05-07 NOTE — TOC Transition Note (Signed)
Transition of Care [facility]) - CM/SW Discharge Note   Patient Details  Name: Wesley Newton MRN: [id number] Date of Birth: 03/20/1927  Transition of Care [facility]) CM/SW Contact:  Katrina Stack, RN Phone Number: 05/07/2019, 3:11 PM   Clinical Narrative:   Patient for discharge today. He chooses to go to [facility] as he lives on that campus.  Attending recommendation is to travel by ems due to symptoms related to his orthostasis. Obtained authorization from [facility] for skilled nursing: [id number]- 3 days approved initially. Information sent to and received by [facility] admissions. Discussed disposition with patient's granddaughter [name] who is his HCOA.   Final next level of care: Skilled Nursing Facility Barriers to Discharge: No Barriers Identified   Patient Goals and CMS Choice   CMS Medicare.gov Compare Post Acute Care list provided to:: Patient Choice offered to / list presented to : Patient  Discharge Placement              Patient chooses bed at: [facility]) Patient to be transferred to facility by: [facility]) Name of family member notified: Salley Hews, granddaughter Patient and family notified of of transfer: 05/07/19  Discharge Plan and Services                                     Social Determinants of Health (SDOH) Interventions     Readmission Risk Interventions No flowsheet data found.

## 2019-05-07 NOTE — Progress Notes (Addendum)
Speech Language Pathology Treatment: Dysphagia  Patient Details Name: Wesley Newton MRN: [id number] DOB: 10/06/1927 Today's Date: 05/07/2019 Time: 1145-1235 SLP Time Calculation (min) (ACUTE ONLY): 50 min  Assessment / Plan / Recommendation Clinical Impression  Pt seen today for ongoing assessment of toleration of current regular diet; general education on Reflux. Per UGI today, pt has "Tertiary contractions throughout the esophagus as can be seen with esophageal spasm". Pt also endorsed difficulty w/ Phlegm on a DAILY basis baseline(during the night, mornings) and w/ swallowing certain Pills(baseline). ANY Esophageal dysmotility can increase risk for regurgitation and aspiration of such regurgitation/Reflux. Noted recent CXRs this admission.  Pt consumed po trials of thin liquids, purees and soft solids w/ no overt s/s of aspiration noted to the swallow; pt exhibited a quite mild throat clear b/t trials not immediately following trials. This did not increase in presentation and suspect it could be related to Esophageal dysmotility as it was soon followed by belching hiccups. Pt endorsed other s/s of REFLUX as well(pressure mid-sternum) and described his h/o Phlegm at night and in mornings upon awaking. Suspect this is a s/s of REFLUX as well. Pt fed himself; NO straws were used to avoid increased air swallowed. Pt stated he preferred to drink from the Cup for "better control".   Discussed and gave education on REFLUX and general aspiration precautions. Recommended f/u w/ GI for further management of the REFLUX as ANY esophageal dysmotility can increase risk for regurgitation and aspiration of such regurgitation/Reflux. Noted he has been prescribed a PPI today. Also recommended f/u w/ Dietician for dietary support - may do well w/ a drink form of supplement.  Recommend continue current diet as ordered w/ soft, moist foods already broken down; soups. Recommended continue pills in Puree for easier,  safer swallowing.  No further skilled ST services indicated at this time as pt appears at his baseline. Pt agreed. Handouts given. NSG updated.     Wesley Newton Wesley Newton: Pt is a 85 y.o. male who presents to the ED with a complaint of chest palpitations and some mild associated shortness of breath.  Here he is found to be in A. fib with RVR.  He has no prior history of this per his recollection or that can be found on chart review.  He was given a couple of doses of Cardizem in the ED and then placed on a Cardizem drip.  He had moderate response with some reduction in heart rate.  During this admission, an UGI was performed revealing: "Tertiary contractions throughout the esophagus as can be seen with esophageal spasm.". Pt endorses difficulty swallowing Pills prior to admission; as well as increased Phlegm daily moreso during the night and when awaking.  Per pt's H&P, he has no dx of Reflux/GERD.       SLP Plan  All goals met       Recommendations  Diet recommendations: Regular;Thin liquid(w/ soft, moist foods inlcuded) Liquids provided via: Cup;No straw(recommended d/t air) Medication Administration: Whole meds with puree(as needed for easier swallowing) Supervision: Patient able to self feed Compensations: Minimize environmental distractions;Slow rate;Small sips/bites;Multiple dry swallows after each bite/sip;Follow solids with liquid Postural Changes and/or Swallow Maneuvers: Seated upright 90 degrees;Upright 30-60 min after meal(Rest Breaks during meals)                General recommendations: (Dietician f/u) Oral Care Recommendations: Oral care BID;Patient independent with oral care Follow up Recommendations: None SLP Visit Diagnosis: Dysphagia, unspecified (R13.10)(Esophageal dysmotility) Plan: All goals met         [place], [place], CCC-SLP Anelle Parlow 05/07/2019, 1:38 PM

## 2019-05-07 NOTE — Plan of Care (Signed)
  Problem: Clinical Measurements: Goal: Will remain free from infection Outcome: Progressing Goal: Respiratory complications will improve Outcome: Progressing Goal: Cardiovascular complication will be avoided Outcome: Progressing   Problem: Activity: Goal: Risk for activity intolerance will decrease Outcome: Progressing   Problem: Pain Managment: Goal: General experience of comfort will improve Outcome: Completed/Met   Problem: Safety: Goal: Ability to remain free from injury will improve Outcome: Completed/Met

## 2019-05-07 NOTE — Discharge Summary (Addendum)
[facility] [place] at [facility], [place] y[place]o., DOB 08-24-1927, MRN [id number]. Admission date: 05/04/2019 Discharge Date 05/07/2019 Primary MD Baxter Hire, MD Admitting Physician Lance Coon, MD  Admission Diagnosis  Atrial fibrillation with RVR [facility]) [I48.91]  Discharge Diagnosis   Principal Problem: A. fib with RVR Dysphasia due to severe GERD and esophageal spasm Severe orthostatic hypotension Chronic respiratory failure due to pulmonary fibrosis Hypertension BPH GERD Hypercholesterolemia   Hospital Course  Wesley Newton  is a 85 y.o. male who presents with chief complaint as above.  Patient presents the ED with a complaint of chest palpitations and some mild associated shortness of breath.  Here he is found to be in A. fib with RVR.  Patient was admitted and started on a Cardizem drip.  His heart rates improved.  He was seen by cardiology and placed on amiodarone drip and Eliquis.  Patient's heart rate is been stable.  Patient started complaining of dysphasia.  Which he states that is been going on for a while.  He underwent work-up with upper GI series which shows severe GERD.  Patient also had some esophageal spasms.  The patient was treated with PPIs.  Was also seen in consultation by GI.  Patient was ambulated and was noted to have severe orthostatic hypotension.     Patient to wear TED hose at all time        Consults  GI, cardiology  Significant Tests:  See full reports for all details     Dg Chest Portable 1 View  Result Date: 05/04/2019 CLINICAL DATA:  Shortness of breath and palpitations EXAM: PORTABLE CHEST 1 VIEW COMPARISON:  03/03/2019 FINDINGS: Extensive interstitial opacity throughout both lungs is unchanged and consistent known interstitial lung disease. There is no area of confluent consolidation. No pleural effusion or pneumothorax. Normal cardiomediastinal contours. IMPRESSION: Chronic interstitial lung disease without  acute consolidation. Electronically Signed   By: Ulyses Jarred M.D.   On: 05/04/2019 22:34   Dg Duanne Limerick W Double Cm (hd Ba)  Addendum Date: 05/07/2019   ADDENDUM REPORT: 05/07/2019 10:45 ADDENDUM: Typographical error in the findings and impression. Corrected statement should read as follows: Tertiary contractions throughout the esophagus as can be seen with esophageal spasm. Electronically Signed   By: Kathreen Devoid   On: 05/07/2019 10:45   Addendum Date: 05/07/2019   ADDENDUM REPORT: 05/07/2019 09:41 ADDENDUM: At the end of the examination a 13 mm barium tablet was administered which transited through the esophagus and esophagogastric junction without delay. Electronically Signed   By: Kathreen Devoid   On: 05/07/2019 09:41   Result Date: 05/07/2019 CLINICAL DATA:  Dysphagia, problems swallowing for 1 year EXAM: UPPER GI SERIES WITHOUT KUB TECHNIQUE: Routine upper GI series was performed with thin/high density/water soluble barium. FLUOROSCOPY TIME:  Fluoroscopy Time:  1.3 minute Radiation Exposure Index (if provided by the fluoroscopic device): 11.2 mGy Number of Acquired Spot Images: 0 COMPARISON:  None. FINDINGS: There was normal pharyngeal anatomy and motility. Contrast flowed freely through the esophagus without evidence of stricture or mass. There was normal esophageal mucosa without evidence of irregularity or ulceration. Tertiary contractions throughout the esophagus as can be seen with soft tissue ulcer spasm. Severe gastroesophageal reflux extending to the cervical esophagus. No definite hiatal hernia was demonstrated. Examination of the stomach demonstrated normal rugal folds and areae gastricae. The gastric mucosa appeared unremarkable without evidence of ulceration, scarring, or mass lesion. Gastric motility and emptying was normal. Fluoroscopic examination of  the duodenum demonstrates normal motility and morphology without evidence of ulceration or mass lesion. IMPRESSION: 1. Tertiary contractions  throughout the esophagus as can be seen with soft tissue ulcer spasm. 2. Severe gastroesophageal reflux extending to the cervical esophagus. Electronically Signed: By: Kathreen Devoid On: 05/07/2019 09:31       Today   Subjective:   Wesley Newton patient doing better shortness of breath improved Objective:   Blood pressure 115/61, pulse 92, temperature 97.6 F (36.4 C), temperature source Oral, resp. rate 20, height 5\' 6"  (1.676 m), weight 62.6 kg, SpO2 97 %.  .  Intake/Output Summary (Last 24 hours) at 05/07/2019 1216 Last data filed at 05/07/2019 1020 Gross per 24 hour  Intake -  Output 1150 ml  Net -1150 ml    Exam VITAL SIGNS: Blood pressure 115/61, pulse 92, temperature 97.6 F (36.4 C), temperature source Oral, resp. rate 20, height 5\' 6"  (1.676 m), weight 62.6 kg, SpO2 97 %.  GENERAL:  85 y.o.-year-old patient lying in the bed with no acute distress.  EYES: Pupils equal, round, reactive to light and accommodation. No scleral icterus. Extraocular muscles intact.  HEENT: Head atraumatic, normocephalic. Oropharynx and nasopharynx clear.  NECK:  Supple, no jugular venous distention. No thyroid enlargement, no tenderness.  LUNGS: Normal breath sounds bilaterally, no wheezing, rales,rhonchi or crepitation. No use of accessory muscles of respiration.  CARDIOVASCULAR: S1, S2 normal. No murmurs, rubs, or gallops.  ABDOMEN: Soft, nontender, nondistended. Bowel sounds present. No organomegaly or mass.  EXTREMITIES: No pedal edema, cyanosis, or clubbing.  NEUROLOGIC: Cranial nerves II through XII are intact. Muscle strength 5/5 in all extremities. Sensation intact. Gait not checked.  PSYCHIATRIC: The patient is alert and oriented x 3.  SKIN: No obvious rash, lesion, or ulcer.   Data Review     CBC w Diff:  Lab Results  Component Value Date   WBC 12.4 (H) 05/04/2019   HGB 13.7 05/04/2019   HGB 13.3 12/12/2012   HCT 41.4 05/04/2019   HCT 41.8 12/12/2012   PLT 328 05/04/2019    PLT 245 12/12/2012   LYMPHOPCT 39 05/04/2019   LYMPHOPCT 25.4 12/12/2012   MONOPCT 11 05/04/2019   MONOPCT 9.8 12/12/2012   EOSPCT 0 05/04/2019   EOSPCT 2.0 12/12/2012   BASOPCT 0 05/04/2019   BASOPCT 1.3 12/12/2012   CMP:  Lab Results  Component Value Date   NA 139 05/04/2019   NA 138 12/12/2012   K 4.2 05/04/2019   K 4.0 12/12/2012   CL 102 05/04/2019   CL 105 12/12/2012   CO2 29 05/04/2019   CO2 26 12/12/2012   BUN 23 05/04/2019   BUN 17 12/12/2012   CREATININE 1.06 05/04/2019   CREATININE 1.26 12/12/2012   PROT 7.3 05/04/2019   PROT 7.8 12/12/2012   ALBUMIN 3.7 05/04/2019   ALBUMIN 3.8 12/12/2012   BILITOT 0.5 05/04/2019   BILITOT 0.5 12/12/2012   ALKPHOS 93 05/04/2019   ALKPHOS 94 12/12/2012   AST 26 05/04/2019   AST 20 12/12/2012   ALT 22 05/04/2019   ALT 25 12/12/2012  .  Micro Results Recent Results (from the past 240 hour(s))  SARS Coronavirus 2 (CEPHEID - Performed in [place] hospital lab), Hosp Order     Status: None   Collection Time: 05/04/19 10:13 PM  Result Value Ref Range Status   SARS Coronavirus 2 NEGATIVE NEGATIVE Final    Comment: (NOTE) If result is NEGATIVE SARS-CoV-2 target nucleic acids are NOT DETECTED. The SARS-CoV-2 RNA  is generally detectable in upper and lower  respiratory specimens during the acute phase of infection. The lowest  concentration of SARS-CoV-2 viral copies this assay can detect is 250  copies / mL. A negative result does not preclude SARS-CoV-2 infection  and should not be used as the sole basis for treatment or other  patient management decisions.  A negative result may occur with  improper specimen collection / handling, submission of specimen other  than nasopharyngeal swab, presence of viral mutation(s) within the  areas targeted by this assay, and inadequate number of viral copies  (<250 copies / mL). A negative result must be combined with clinical  observations, patient history, and epidemiological  information. If result is POSITIVE SARS-CoV-2 target nucleic acids are DETECTED. The SARS-CoV-2 RNA is generally detectable in upper and lower  respiratory specimens dur ing the acute phase of infection.  Positive  results are indicative of active infection with SARS-CoV-2.  Clinical  correlation with patient history and other diagnostic information is  necessary to determine patient infection status.  Positive results do  not rule out bacterial infection or co-infection with other viruses. If result is PRESUMPTIVE POSTIVE SARS-CoV-2 nucleic acids MAY BE PRESENT.   A presumptive positive result was obtained on the submitted specimen  and confirmed on repeat testing.  While 2019 novel coronavirus  (SARS-CoV-2) nucleic acids may be present in the submitted sample  additional confirmatory testing may be necessary for epidemiological  and / or clinical management purposes  to differentiate between  SARS-CoV-2 and other Sarbecovirus currently known to infect humans.  If clinically indicated additional testing with an alternate test  methodology [phone]) is advised. The SARS-CoV-2 RNA is generally  detectable in upper and lower respiratory sp ecimens during the acute  phase of infection. The expected result is Negative. Fact Sheet for Patients:  StrictlyIdeas.no Fact Sheet for Healthcare Providers: BankingDealers.co.za This test is not yet approved or cleared by the [place] FDA and has been authorized for detection and/or diagnosis of SARS-CoV-2 by FDA under an Emergency Use Authorization (EUA).  This EUA will remain in effect (meaning this test can be used) for the duration of the COVID-19 declaration under Section 564(b)(1) of the Act, 21 U.S.C. section 360bbb-3(b)(1), unless the authorization is terminated or revoked sooner. Performed at [facility], [address] [place], [place] [address]   MRSA PCR Screening      Status: None   Collection Time: 05/06/19 12:30 PM  Result Value Ref Range Status   MRSA by PCR NEGATIVE NEGATIVE Final    Comment:        The GeneXpert MRSA Assay (FDA approved for NASAL specimens only), is one component of a comprehensive MRSA colonization surveillance program. It is not intended to diagnose MRSA infection nor to guide or monitor treatment for MRSA infections. Performed at [facility], [address] [place], [place] [address]         Code Status Orders  (From admission, onward)         Start     Ordered   05/05/19 0034  Do not attempt resuscitation (DNR)  Continuous    Question Answer Comment  In the event of cardiac or respiratory ARREST Do not call a "code blue"   In the event of cardiac or respiratory ARREST Do not perform Intubation, CPR, defibrillation or ACLS   In the event of cardiac or respiratory ARREST Use medication by any route, position, wound care, and other measures to relive  pain and suffering. May use oxygen, suction and manual treatment of airway obstruction as needed for comfort.      05/05/19 0033        Code Status History    Date Active Date Inactive Code Status Order ID Comments User Context   03/03/2019 2208 03/05/2019 1734 DNR [id number]  Arta Silence, MD Inpatient   12/19/2018 0234 12/21/2018 1734 Full Code [id number]  Lance Coon, MD Inpatient    Advance Directive Documentation     Most Recent Value  Type of Advance Directive  Healthcare Power of [place], Out of facility DNR (pink MOST or yellow form)  Pre-existing out of facility DNR order (yellow form or pink MOST form)  Yellow form placed in chart (order not valid for inpatient use)  "MOST" Form in Place?  -          Follow-up Information    Baxter Hire, MD On 05/09/2019.   Specialty:  Internal Medicine Why:  APPOINTMENT AT 3PM Contact information: [place] [address] [phone]        Yolonda Kida, MD On  05/21/2019.   Specialties:  Cardiology, Internal Medicine Why:  hosp f/u........APPOINTMENT AT 10AM Contact information: [place]  [address] [phone]           Discharge Medications   Allergies as of 05/07/2019      Reactions   Amoxicillin Other (See Comments)   DID THE REACTION INVOLVE: Swelling of the face/tongue/throat, SOB, or low BP? Unknown Sudden or severe rash/hives, skin peeling, or the inside of the mouth or nose? Unknown Did it require medical treatment? Unknown When did it last happen?Unknown If all above answers are "NO", may proceed with cephalosporin use.   Codeine Nausea And Vomiting   Rapaflo [silodosin] Other (See Comments)   Reaction:  Unknown    Tramadol Nausea And Vomiting, Other (See Comments)   Pt states that this medication makes him feel crazy.     Trimethoprim Other (See Comments)   Reaction: unknown      Medication List    STOP taking these medications   benzonatate 100 MG capsule Commonly known as:  TESSALON   omeprazole 20 MG capsule Commonly known as:  PRILOSEC   predniSONE 10 MG (21) Tbpk tablet Commonly known as:  STERAPRED UNI-PAK 21 TAB   simvastatin 40 MG tablet Commonly known as:  ZOCOR     TAKE these medications   albuterol 108 (90 Base) MCG/ACT inhaler Commonly known as:  VENTOLIN HFA Inhale 2 puffs into the lungs every 6 (six) hours as needed for wheezing or shortness of breath.   amiodarone 200 MG tablet Commonly known as:  PACERONE Take 1 tablet (200 mg total) by mouth 2 (two) times daily.   apixaban 2.5 MG Tabs tablet Commonly known as:  ELIQUIS Take 1 tablet (2.5 mg total) by mouth 2 (two) times daily.   aspirin EC 81 MG tablet Take 81 mg by mouth daily.   atorvastatin 20 MG tablet Commonly known as:  LIPITOR Take 1 tablet (20 mg total) by mouth daily at 6 PM.   azelastine 0.1 % nasal spray Commonly known as:  ASTELIN Place 1 spray into both nostrils daily.   budesonide 0.5  MG/2ML nebulizer solution Commonly known as:  PULMICORT Take 0.5 mg by nebulization daily.   dextromethorphan-guaiFENesin 30-600 MG 12hr tablet Commonly known as:  MUCINEX DM Take 1 tablet by mouth 2 (two) times daily.   feeding supplement (ENSURE  ENLIVE) Liqd Take 237 mLs by mouth 2 (two) times daily between meals.   finasteride 5 MG tablet Commonly known as:  PROSCAR Take 1 tablet (5 mg total) by mouth daily.   gabapentin 100 MG capsule Commonly known as:  NEURONTIN Take 100-200 mg by mouth 2 (two) times daily. Take 2 capsules (200mg ) by mouth every morning and 1 capsule (100mg ) by mouth every night   guaiFENesin 600 MG 12 hr tablet Commonly known as:  MUCINEX Take 1 tablet (600 mg total) by mouth 2 (two) times daily.   ipratropium-albuterol 0.5-2.5 (3) MG/3ML Soln Commonly known as:  DUONEB Inhale 3 mLs into the lungs 4 (four) times daily.   LORazepam 0.5 MG tablet Commonly known as:  ATIVAN Take 1 tablet (0.5 mg total) by mouth daily as needed for anxiety.   midodrine 10 MG tablet Commonly known as:  PROAMATINE Take 1 tablet (10 mg total) by mouth 3 (three) times daily with meals.   multivitamin with minerals Tabs tablet Take 1 tablet by mouth daily.   naphazoline-glycerin 0.012-0.2 % Soln Commonly known as:  CLEAR EYES REDNESS Place 1-2 drops into both eyes 4 (four) times daily as needed for eye irritation.   pantoprazole 40 MG tablet Commonly known as:  PROTONIX Take 1 tablet (40 mg total) by mouth 2 (two) times daily.   tamsulosin 0.4 MG Caps capsule Commonly known as:  FLOMAX Take 2 capsules (0.8 mg total) by mouth daily.          Total Time in preparing paper work, data evaluation and todays exam - 52 minutes  Dustin Flock M.D on 05/07/2019 at [place]  [phone]

## 2019-05-07 NOTE — NC FL2 (Signed)
[place] LEVEL OF CARE SCREENING TOOL     IDENTIFICATION  Patient Name: PERCIVAL GLASHEEN Birthdate: [birth year] Sex: male Admission Date (Current Location): 05/04/2019  [place] and [place] Number:  Engineering geologist and Address:  [facility], [address], [place], [place] [address]      Provider Number: [id number]  Attending Physician Name and Address:  Dustin Flock, MD  Relative Name and Phone Number:       Current Level of Care: Hospital Recommended Level of Care: [place] Prior Approval Number:    Date Approved/Denied:   PASRR Number: [id number]  Discharge Plan: SNF    Current Diagnoses: Patient Active Problem List   Diagnosis Date Noted  . Atrial fibrillation with RVR ([place]) 05/04/2019  . Idiopathic pulmonary fibrosis ([place]) 03/05/2019  . Malnutrition of moderate degree 12/21/2018  . Influenza B 12/19/2018  . CAP (community acquired pneumonia) 12/19/2018  . PAD (peripheral artery disease) ([place]) 04/25/2017  . BPH (benign prostatic hyperplasia) 01/10/2016  . Nocturia 01/10/2016  . Benign prostatic hyperplasia with urinary obstruction 01/10/2016  . AB (asthmatic bronchitis) 12/17/2014  . Carotid artery disease ([place]) 12/04/2014  . GERD (gastroesophageal reflux disease) 12/04/2014  . H/O respiratory system disease 12/04/2014  . H/O peptic ulcer 12/04/2014  . Hypercholesterolemia 12/04/2014  . Unstable angina pectoris ([place]) 12/04/2014  . Amnesia 12/04/2014  . Chronic obstructive pulmonary disease ([place]) 06/11/2014  . Cephalalgia 06/11/2014  . HTN (hypertension) 06/11/2014    Orientation RESPIRATION BLADDER Height & Weight     Self, Time, Situation, Place  Normal Continent Weight: 62.6 kg Height:  5\' 6"  (167.6 cm)  BEHAVIORAL SYMPTOMS/MOOD NEUROLOGICAL BOWEL NUTRITION STATUS  Other (Comment)(NA) (NA) Continent Diet  AMBULATORY STATUS COMMUNICATION OF NEEDS Skin   Extensive Assist   Normal                        Personal Care Assistance Level of Assistance              Functional Limitations Info  Hearing   Hearing Info: Impaired      SPECIAL CARE FACTORS FREQUENCY  Blood pressure Blood Pressure Frequency: 3-4 times a day                  Contractures Contractures Info: Not present    Additional Factors Info  Code Status Code Status Info: DNR             Current Medications (05/07/2019):  This is the current hospital active medication list Current Facility-Administered Medications  Medication Dose Route Frequency Provider Last Rate Last Dose  . 0.9 %  sodium chloride infusion   Intravenous PRN Lance Coon, MD 10 mL/hr at 05/05/19 0044    . acetaminophen (TYLENOL) tablet 650 mg  650 mg Oral Q6H PRN Lance Coon, MD   650 mg at 05/06/19 2019   Or  . acetaminophen (TYLENOL) suppository 650 mg  650 mg Rectal Q6H PRN Lance Coon, MD      . amiodarone (PACERONE) tablet 200 mg  200 mg Oral BID Callwood, Dwayne D, MD   200 mg at 05/07/19 1013  . apixaban (ELIQUIS) tablet 2.5 mg  2.5 mg Oral BID Callwood, Dwayne D, MD   2.5 mg at 05/07/19 1013  . atorvastatin (LIPITOR) tablet 20 mg  20 mg Oral q1800 Lu Duffel, RPH   20 mg at 05/06/19 2147  . azelastine (ASTELIN) 0.1 % nasal spray 1 spray  1 spray Each Nare Daily Lance Coon, MD   1 spray at 05/07/19 1012  . budesonide (PULMICORT) nebulizer solution 0.5 mg  0.5 mg Nebulization Daily Lance Coon, MD   0.5 mg at 05/06/19 0756  . docusate sodium (COLACE) capsule 100 mg  100 mg Oral Daily Dustin Flock, MD   100 mg at 05/06/19 2147  . feeding supplement (ENSURE ENLIVE) (ENSURE ENLIVE) liquid 237 mL  237 mL Oral BID BM Dustin Flock, MD   237 mL at 05/07/19 1012  . finasteride (PROSCAR) tablet 5 mg  5 mg Oral Daily Lance Coon, MD   5 mg at 05/07/19 1013  . guaiFENesin (MUCINEX) 12 hr tablet 600 mg  600 mg Oral BID Dustin Flock, MD   600 mg at 05/07/19 1013  . levalbuterol (XOPENEX)  nebulizer solution 1.25 mg  1.25 mg Nebulization Q6H Dustin Flock, MD   1.25 mg at 05/07/19 0202  . LORazepam (ATIVAN) tablet 0.5 mg  0.5 mg Oral Q8H PRN Lance Coon, MD   0.5 mg at 05/06/19 2328  . midodrine (PROAMATINE) tablet 10 mg  10 mg Oral TID WC Dustin Flock, MD   10 mg at 05/07/19 1013  . multivitamin with minerals tablet 1 tablet  1 tablet Oral Daily Dustin Flock, MD   1 tablet at 05/07/19 1013  . ondansetron (ZOFRAN) tablet 4 mg  4 mg Oral Q6H PRN Lance Coon, MD       Or  . ondansetron [facility]) injection 4 mg  4 mg Intravenous Q6H PRN Lance Coon, MD   4 mg at 05/06/19 0150  . pantoprazole (PROTONIX) EC tablet 40 mg  40 mg Oral BID Dustin Flock, MD      . sodium chloride flush (NS) 0.9 % injection 10 mL  10 mL Intravenous Q12H Dustin Flock, MD   10 mL at 05/07/19 1014  . tamsulosin (FLOMAX) capsule 0.8 mg  0.8 mg Oral Daily Lance Coon, MD   0.8 mg at 05/07/19 1013     Discharge Medications: Please see discharge summary for a list of discharge medications.  Relevant Imaging Results:  Relevant Lab Results:   Additional Information    Katrina Stack, RN

## 2019-05-07 NOTE — Progress Notes (Signed)
Patient has symptomatic orthostatic hypotension and would not be safe for him to be transported by personal transport needs ambulance to take him to rehab

## 2019-05-08 DIAGNOSIS — N401 Enlarged prostate with lower urinary tract symptoms: Secondary | ICD-10-CM | POA: Diagnosis not present

## 2019-05-08 DIAGNOSIS — E785 Hyperlipidemia, unspecified: Secondary | ICD-10-CM | POA: Diagnosis not present

## 2019-05-08 DIAGNOSIS — J84112 Idiopathic pulmonary fibrosis: Secondary | ICD-10-CM

## 2019-05-08 DIAGNOSIS — J951 Acute pulmonary insufficiency following thoracic surgery: Secondary | ICD-10-CM | POA: Diagnosis not present

## 2019-05-08 DIAGNOSIS — K219 Gastro-esophageal reflux disease without esophagitis: Secondary | ICD-10-CM | POA: Diagnosis not present

## 2019-05-08 DIAGNOSIS — I480 Paroxysmal atrial fibrillation: Secondary | ICD-10-CM

## 2019-05-16 DIAGNOSIS — I951 Orthostatic hypotension: Secondary | ICD-10-CM

## 2019-05-16 DIAGNOSIS — I480 Paroxysmal atrial fibrillation: Secondary | ICD-10-CM

## 2019-05-16 DIAGNOSIS — J439 Emphysema, unspecified: Secondary | ICD-10-CM

## 2019-05-16 DIAGNOSIS — N400 Enlarged prostate without lower urinary tract symptoms: Secondary | ICD-10-CM

## 2019-05-16 DIAGNOSIS — F39 Unspecified mood [affective] disorder: Secondary | ICD-10-CM

## 2019-05-22 ENCOUNTER — Ambulatory Visit (INDEPENDENT_AMBULATORY_CARE_PROVIDER_SITE_OTHER): Payer: Medicare Other | Admitting: Gastroenterology

## 2019-05-22 DIAGNOSIS — R1312 Dysphagia, oropharyngeal phase: Secondary | ICD-10-CM

## 2019-05-22 NOTE — Progress Notes (Signed)
[name], MD [address]  [place]  [place], [place] [address]  Main: [phone]  Fax: [phone]   Primary Care Physician: Baxter Hire, MD  Virtual Visit via Telephone Note  I connected with patient on 05/22/19 at  9:15 AM EDT by telephone and verified that I am speaking with the correct person using two identifiers.   I discussed the limitations, risks, security and privacy concerns of performing an evaluation and management service by telephone and the availability of in person appointments. I also discussed with the patient that there may be a patient responsible charge related to this service. The patient expressed understanding and agreed to proceed.  Location of Patient: Home Location of Provider: Home Persons involved: Patient and provider only during the visit (nursing staff and front desk staff was involved in communicating with the patient prior to the appointment, reviewing medications and checking them in)   History of Present Illness: Chief complaint: Follow-up for dysphagia  HPI: Wesley Newton is a 85 y.o. male initially seen as a GI consult on May 18 for dysphagia.  Symptoms were considered to be oropharyngeal dysphagia.  An upper GI study was done that reported esophageal spasm and GERD but no narrowing or strictures or other abnormalities.  Patient now states his dysphagia has significantly improved.  He is swallowing things well.  At the time of his admission patient was also considered high risk for upper endoscopy with sedation due to A. fib with RVR on anticoagulation on that admission, underlying pulmonary fibrosis, advanced age, and already underlying diagnosis of oropharyngeal dysphagia, does making risks of procedure higher than benefits at the time.  Speech pathology note on last admission, has cleared the patient for dysphagia 3 diet with thin liquids with strict aspiration precautions.  Last modified barium swallow was done in  August 2019 and impression was "Flash laryngeal penetration of the barium was seen in multiple phases of the study resulting in a cough. No residual barium was Observed."  Current Outpatient Medications  Medication Sig Dispense Refill   albuterol (PROVENTIL HFA;VENTOLIN HFA) 108 (90 Base) MCG/ACT inhaler Inhale 2 puffs into the lungs every 6 (six) hours as needed for wheezing or shortness of breath.     amiodarone (PACERONE) 200 MG tablet Take 1 tablet (200 mg total) by mouth 2 (two) times daily.     apixaban (ELIQUIS) 2.5 MG TABS tablet Take 1 tablet (2.5 mg total) by mouth 2 (two) times daily. 60 tablet    aspirin EC 81 MG tablet Take 81 mg by mouth daily.      atorvastatin (LIPITOR) 20 MG tablet Take 1 tablet (20 mg total) by mouth daily at 6 PM.     azelastine (ASTELIN) 0.1 % nasal spray Place 1 spray into both nostrils daily.     budesonide (PULMICORT) 0.5 MG/2ML nebulizer solution Take 0.5 mg by nebulization daily.     dextromethorphan-guaiFENesin (MUCINEX DM) 30-600 MG 12hr tablet Take 1 tablet by mouth 2 (two) times daily.     feeding supplement, ENSURE ENLIVE, (ENSURE ENLIVE) LIQD Take 237 mLs by mouth 2 (two) times daily between meals. 237 mL 12   finasteride (PROSCAR) 5 MG tablet Take 1 tablet (5 mg total) by mouth daily. 90 tablet 3   gabapentin (NEURONTIN) 100 MG capsule Take 100-200 mg by mouth 2 (two) times daily. Take 2 capsules (200mg ) by mouth every morning and 1 capsule (100mg ) by mouth every night     guaiFENesin (MUCINEX) 600  MG 12 hr tablet Take 1 tablet (600 mg total) by mouth 2 (two) times daily.     ipratropium-albuterol (DUONEB) 0.5-2.5 (3) MG/3ML SOLN Inhale 3 mLs into the lungs 4 (four) times daily.     LORazepam (ATIVAN) 0.5 MG tablet Take 1 tablet (0.5 mg total) by mouth daily as needed for anxiety. 15 tablet 1   midodrine (PROAMATINE) 10 MG tablet Take 1 tablet (10 mg total) by mouth 3 (three) times daily with meals.     Multiple Vitamin  (MULTIVITAMIN WITH MINERALS) TABS tablet Take 1 tablet by mouth daily.     naphazoline-glycerin (CLEAR EYES REDNESS) 0.012-0.2 % SOLN Place 1-2 drops into both eyes 4 (four) times daily as needed for eye irritation.  0   pantoprazole (PROTONIX) 40 MG tablet Take 1 tablet (40 mg total) by mouth 2 (two) times daily.     tamsulosin (FLOMAX) 0.4 MG CAPS capsule Take 2 capsules (0.8 mg total) by mouth daily. 180 capsule 3   No current facility-administered medications for this visit.     Allergies as of 05/22/2019 - Review Complete 05/04/2019  Allergen Reaction Noted   Amoxicillin Other (See Comments) 08/12/2017   Codeine Nausea And Vomiting 12/25/2015   Rapaflo [silodosin] Other (See Comments) 12/25/2015   Tramadol Nausea And Vomiting and Other (See Comments) 09/03/2015   Trimethoprim Other (See Comments) 08/12/2017    Review of Systems:    All systems reviewed and negative except where noted in HPI.   Observations/Objective:  Labs: CMP     Component Value Date/Time   NA 139 05/04/2019 2213   NA 138 12/12/2012 0835   K 4.2 05/04/2019 2213   K 4.0 12/12/2012 0835   CL 102 05/04/2019 2213   CL 105 12/12/2012 0835   CO2 29 05/04/2019 2213   CO2 26 12/12/2012 0835   GLUCOSE 113 (H) 05/04/2019 2213   GLUCOSE 112 (H) 12/12/2012 0835   BUN 23 05/04/2019 2213   BUN 17 12/12/2012 0835   CREATININE 1.06 05/04/2019 2213   CREATININE 1.26 12/12/2012 0835   CALCIUM 9.4 05/04/2019 2213   CALCIUM 9.4 12/12/2012 0835   PROT 7.3 05/04/2019 2213   PROT 7.8 12/12/2012 0835   ALBUMIN 3.7 05/04/2019 2213   ALBUMIN 3.8 12/12/2012 0835   AST 26 05/04/2019 2213   AST 20 12/12/2012 0835   ALT 22 05/04/2019 2213   ALT 25 12/12/2012 0835   ALKPHOS 93 05/04/2019 2213   ALKPHOS 94 12/12/2012 0835   BILITOT 0.5 05/04/2019 2213   BILITOT 0.5 12/12/2012 0835   GFRNONAA >60 05/04/2019 2213   GFRNONAA 52 (L) 12/12/2012 0835   GFRAA >60 05/04/2019 2213   GFRAA >60 12/12/2012 0835   Lab  Results  Component Value Date   WBC 12.4 (H) 05/04/2019   HGB 13.7 05/04/2019   HCT 41.4 05/04/2019   MCV 83.0 05/04/2019   PLT 328 05/04/2019    Imaging Studies: Dg Chest Portable 1 View  Result Date: 05/04/2019 CLINICAL DATA:  Shortness of breath and palpitations EXAM: PORTABLE CHEST 1 VIEW COMPARISON:  03/03/2019 FINDINGS: Extensive interstitial opacity throughout both lungs is unchanged and consistent known interstitial lung disease. There is no area of confluent consolidation. No pleural effusion or pneumothorax. Normal cardiomediastinal contours. IMPRESSION: Chronic interstitial lung disease without acute consolidation. Electronically Signed   By: Ulyses Jarred M.D.   On: 05/04/2019 22:34   Dg Duanne Limerick W Double Cm (hd Ba)  Addendum Date: 05/07/2019   ADDENDUM REPORT: 05/07/2019 10:45 ADDENDUM: Typographical error  in the findings and impression. Corrected statement should read as follows: Tertiary contractions throughout the esophagus as can be seen with esophageal spasm. Electronically Signed   By: Kathreen Devoid   On: 05/07/2019 10:45   Addendum Date: 05/07/2019   ADDENDUM REPORT: 05/07/2019 09:41 ADDENDUM: At the end of the examination a 13 mm barium tablet was administered which transited through the esophagus and esophagogastric junction without delay. Electronically Signed   By: Kathreen Devoid   On: 05/07/2019 09:41   Result Date: 05/07/2019 CLINICAL DATA:  Dysphagia, problems swallowing for 1 year EXAM: UPPER GI SERIES WITHOUT KUB TECHNIQUE: Routine upper GI series was performed with thin/high density/water soluble barium. FLUOROSCOPY TIME:  Fluoroscopy Time:  1.3 minute Radiation Exposure Index (if provided by the fluoroscopic device): 11.2 mGy Number of Acquired Spot Images: 0 COMPARISON:  None. FINDINGS: There was normal pharyngeal anatomy and motility. Contrast flowed freely through the esophagus without evidence of stricture or mass. There was normal esophageal mucosa without evidence of  irregularity or ulceration. Tertiary contractions throughout the esophagus as can be seen with soft tissue ulcer spasm. Severe gastroesophageal reflux extending to the cervical esophagus. No definite hiatal hernia was demonstrated. Examination of the stomach demonstrated normal rugal folds and areae gastricae. The gastric mucosa appeared unremarkable without evidence of ulceration, scarring, or mass lesion. Gastric motility and emptying was normal. Fluoroscopic examination of the duodenum demonstrates normal motility and morphology without evidence of ulceration or mass lesion. IMPRESSION: 1. Tertiary contractions throughout the esophagus as can be seen with soft tissue ulcer spasm. 2. Severe gastroesophageal reflux extending to the cervical esophagus. Electronically Signed: By: Kathreen Devoid On: 05/07/2019 09:31    Assessment and Plan:   Wesley Newton is a 85 y.o. y/o male with dysphagia  Assessment and Plan: Patient dysphagia has been proven to be largely oropharyngeal based on modified barium swallow done last year showing flash laryngeal penetration in multiple swallows.  Patient states with the swallow techniques and diet changes that were recommended his swallowing is much better and he is satisfied with his improvement.  Now that he is further out from his hospitalization, we did discuss the possibility of upper endoscopy after discussion with anesthesia and clearance from cardiology.  However, given his improvement patient is not interested in upper endoscopy at this time.  Given the finding of GERD on his upper GI study, he was discharged on Protonix.  He denies any heartburn with this.  It may also be helping with his dysphagia.  Therefore, patient can continue on current dose and complete therapy with no further refills if patient remains asymptomatic. (Risks of PPI use were discussed with patient including bone loss, C. Diff diarrhea, pneumonia, infections, CKD, electrolyte abnormalities.   If clinically possible based on symptoms, goal would be to maintain patient on the lowest dose possible, or discontinue the medication with institution of acid reflux lifestyle modifications over time. Pt. Verbalizes understanding and chooses to continue the medication.)  The the report of GERD on an upper GI study is not entirely specific itself since a pH monitoring study is actually needed to confirm that.  However given that patient symptoms are improving we would not want to put the patient through more procedures with his comorbidities and age without it changing management.  He is not interested in procedures himself either given that his symptoms are improving but is interested in follow-up with [place] which we will schedule.   He would like to follow-up with [place] in  3 months and if things change reconsider the procedure at that time and is willing to call us if his symptoms change between then.  PCP can consider repeat modified barium swallow if his symptoms change given that it was abnormal last year.  continue following speech and swallow pathology recommendations for diet and swallowing techniques.  Follow-up with PCP closely   No episodes of food impaction  Follow Up Instructions: Follow-up in 3 months   I discussed the assessment and treatment plan with the patient. The patient was provided an opportunity to ask questions and all were answered. The patient agreed with the plan and demonstrated an understanding of the instructions.   The patient was advised to call back or seek an in-person evaluation if the symptoms worsen or if the condition fails to improve as anticipated.  I provided 15 minutes of non-face-to-face time during this encounter. Additional time was spent in reviewing patient's chart, placing orders etc.   Virgel Manifold, MD  Speech recognition software was used to dictate this note.

## 2019-05-27 ENCOUNTER — Telehealth: Payer: Self-pay | Admitting: Student

## 2019-05-27 NOTE — Telephone Encounter (Signed)
Palliative NP called to check on patient and schedule f/u appointment. He reports feeling better in the past several days. He is currently receiving therapy. Palliative appointment scheduled for 06/18/2019 at 1pm.

## 2019-06-04 ENCOUNTER — Other Ambulatory Visit: Payer: Self-pay

## 2019-06-04 ENCOUNTER — Telehealth (INDEPENDENT_AMBULATORY_CARE_PROVIDER_SITE_OTHER): Payer: Medicare Other | Admitting: Urology

## 2019-06-04 ENCOUNTER — Telehealth: Payer: Self-pay | Admitting: Urology

## 2019-06-04 DIAGNOSIS — N138 Other obstructive and reflux uropathy: Secondary | ICD-10-CM

## 2019-06-04 DIAGNOSIS — N401 Enlarged prostate with lower urinary tract symptoms: Secondary | ICD-10-CM | POA: Diagnosis not present

## 2019-06-04 DIAGNOSIS — R351 Nocturia: Secondary | ICD-10-CM

## 2019-06-04 NOTE — [place] Notes (Signed)
Virtual Visit via Telephone Note  I connected with Wesley Newton on 06/04/2019 at 0930 by telephone and verified that I am speaking with the correct person using two identifiers.  They are located at home.  I am located at my home.    This visit type was conducted due to national recommendations for restrictions regarding the COVID-19 Pandemic (e.g. social distancing).  This format is felt to be most appropriate for this patient at this time.  All issues noted in this document were discussed and addressed.  No physical exam was performed.   I discussed the limitations, risks, security and privacy concerns of performing an evaluation and management service by telephone and the availability of in person appointments. I also discussed with the patient that there may be a patient responsible charge related to this service. The patient expressed understanding and agreed to proceed.   History of Present Illness: Wesley Newton is a 85 year old male with BPH with LU TS and nocturia who is contacted via telephone to see if the increase in his tamsulosin to 0.8 mg daily has helped with his incontinence.    He states he has noticed an improvement in his urinary symptoms.  He has had no further incontinence.  Patient denies any gross hematuria, dysuria or suprapubic/flank pain.  Patient denies any fevers, chills, nausea or vomiting.    Observations/Objective: Wesley Newton is having a bad day with his pulmonary fibrosis, but he does not sound distressed.  He is somewhat hard of hearing, but he is answering questions appropriately.    Assessment and Plan:  1. BPH with LU TS Continue tamsulosin 0.4 mg x 2 daily Continue the finasteride 5 mg daily  2. Nocturia Continue to manage conservatively  Follow Up Instructions:  Wesley Newton will have a follow up appointment in 6 months for I PSS and PVR.   I discussed the assessment and treatment plan with the patient. The patient was provided an opportunity  to ask questions and all were answered. The patient agreed with the plan and demonstrated an understanding of the instructions.   The patient was advised to call back or seek an in-person evaluation if the symptoms worsen or if the condition fails to improve as anticipated.  I provided 5 minutes of non-face-to-face time during this encounter.   Eldoris Beiser, PA-C

## 2019-06-04 NOTE — Telephone Encounter (Signed)
Would you call Mr. Chick and schedule him for a follow up appointment in 6 months for an I PSS and PVR?

## 2019-06-18 ENCOUNTER — Other Ambulatory Visit: Payer: Medicare Other | Admitting: Student

## 2019-06-18 ENCOUNTER — Other Ambulatory Visit: Payer: Self-pay

## 2019-06-18 DIAGNOSIS — Z515 Encounter for palliative care: Secondary | ICD-10-CM

## 2019-06-18 NOTE — Progress Notes (Signed)
[facility] Consult Note Telephone: [phone]  Fax: [phone]  PATIENT NAME: Wesley Newton DOB: Jun 08, 1927 MRN: [id number]  PRIMARY CARE PROVIDER:   Baxter Hire, MD  REFERRING PROVIDER:  Baxter Hire, MD [place],  [place] [address]  RESPONSIBLE PARTY:  Granddaughter Wesley Newton-HCPOA   ASSESSMENT: NP explained role of Palliative Medicine. Wesley Newton is alert and oriented x 3. No acute distress noted; breath unlabored. We discussed ongoing goals of care and symptom management. Wesley Newton is currently receiving physical therapy. Palliative Medicine will continue to provide support and make recommendations as needed.      RECOMMENDATIONS and PLAN:  1. Code Status: DNR. 2. Medical Goals of Therapy: Continue PT through [facility]. Palliative Medicine will monitor for changes and declines. 3. Symptom management: Dyspnea- Continue nebulizers, inhaler as directed. He is also encouraged to take his lorazepam every 8 hours prn for anxiety, which can also help with his breathing.  Pain-mild pain-continue acetaminophen 650mg  every 4 hours prn.  4. Discharge Planning: he will continue to reside at [facility]. 5. Discussed with Wesley Newton he is encouraged to call with questions.    I spent 25 minutes providing this consultation,  from 1:05pm to 1:30pm. More than 50% of the time in this consultation was spent coordinating communication.   HISTORY OF PRESENT ILLNESS:  Wesley Newton is a 85 y.o. male with multiple medical problems including COPD, pulmonary emphysema, chronic cough, dyspnea on exertion, asthmatic bronchitis, diastolic heart failure, hypertension, hypercholesterolemia, gerd, rhinitis, anemia, BPH, lumbago. Palliative Care was asked to help address goals of care; he is seen for follow up visit today. He was hospitalized 5/16-5/19/2020 due to atrial fibrillation with RVR, dysphagia due  to severe GERD. Wesley Newton reports feeling well today. He states that his breathing is better since last palliative visit. He reports dyspnea with exertion. He is taking nebulizers, inhaler as ordered. He reports a productive cough in the morning, phlegm is usually clear, sometimes yellow. Feels he is drinking adequate water. He is receiving physical therapy with Wesley Newton. His daughter recently brought him some walking sticks. No recent falls. His appetite fair, eating 3 meals a day. His weight had gotten down to 136 pounds, he is back up to 148 pounds last week. He denies swallowing difficulty; currently on protonix BID. Sleeping well at night; does get up during night to void. He denies pain, chest pain, palpitation, nausea or constipation.      CODE STATUS: DNR.  PPS: 50% HOSPICE ELIGIBILITY/DIAGNOSIS: TBD  PAST MEDICAL HISTORY:  Past Medical History:  Diagnosis Date  . Asthma   . BPH (benign prostatic hyperplasia)   . Bronchitis   . Cancer ([place])   . COPD (chronic obstructive pulmonary disease) ([place])   . DVT of leg (deep venous thrombosis) ([place])   . Elevated PSA   . Emphysema lung ([place])   . Emphysema of lung ([place])   . Hyperlipidemia   . Nocturia   . Pulmonary fibrosis ([place])   . Pulmonary fibrosis ([place])   . Skin cancer     SOCIAL HX:  Social History   Tobacco Use  . Smoking status: Former Research scientist (life sciences)  . Smokeless tobacco: Never Used  . Tobacco comment: quit 40 years  Substance Use Topics  . Alcohol use: No    Alcohol/week: 0.0 standard drinks    ALLERGIES:  Allergies  Allergen Reactions  . Amoxicillin Other (See Comments)  DID THE REACTION INVOLVE: Swelling of the face/tongue/throat, SOB, or low BP? Unknown Sudden or severe rash/hives, skin peeling, or the inside of the mouth or nose? Unknown Did it require medical treatment? Unknown When did it last happen?Unknown If all above answers are "NO", may proceed with cephalosporin use.   . Codeine Nausea And  Vomiting  . Rapaflo [Silodosin] Other (See Comments)    Reaction:  Unknown   . Tramadol Nausea And Vomiting and Other (See Comments)    Pt states that this medication makes him feel crazy.    . Trimethoprim Other (See Comments)    Reaction: unknown     PERTINENT MEDICATIONS:  Outpatient Encounter Medications as of 06/18/2019  Medication Sig  . albuterol (PROVENTIL HFA;VENTOLIN HFA) 108 (90 Base) MCG/ACT inhaler Inhale 2 puffs into the lungs every 6 (six) hours as needed for wheezing or shortness of breath.  Marland Kitchen amiodarone (PACERONE) 200 MG tablet Take 1 tablet (200 mg total) by mouth 2 (two) times daily. (Patient taking differently: Take 200 mg by mouth daily. )  . apixaban (ELIQUIS) 2.5 MG TABS tablet Take 1 tablet (2.5 mg total) by mouth 2 (two) times daily.  Marland Kitchen aspirin EC 81 MG tablet Take 81 mg by mouth daily.   Marland Kitchen azelastine (ASTELIN) 0.1 % nasal spray Place 1 spray into both nostrils daily.  . budesonide (PULMICORT) 0.5 MG/2ML nebulizer solution Take 0.5 mg by nebulization daily.  . finasteride (PROSCAR) 5 MG tablet Take 1 tablet (5 mg total) by mouth daily.  Marland Kitchen gabapentin (NEURONTIN) 100 MG capsule Take 100-200 mg by mouth 2 (two) times daily. Take 2capsules (200mg ) by mouth every night and 1 capsule (100mg ) by mouth every morning.  Marland Kitchen guaiFENesin (MUCINEX) 600 MG 12 hr tablet Take 1 tablet (600 mg total) by mouth 2 (two) times daily.  Marland Kitchen ipratropium-albuterol (DUONEB) 0.5-2.5 (3) MG/3ML SOLN Inhale 3 mLs into the lungs 4 (four) times daily.  Marland Kitchen LORazepam (ATIVAN) 0.5 MG tablet Take 1 tablet (0.5 mg total) by mouth daily as needed for anxiety.  . midodrine (PROAMATINE) 10 MG tablet Take 1 tablet (10 mg total) by mouth 3 (three) times daily with meals.  . Multiple Vitamins-Minerals (OCUVITE ADULT 50+ PO) Take 1 capsule by mouth daily.  . naphazoline-glycerin (CLEAR EYES REDNESS) 0.012-0.2 % SOLN Place 1-2 drops into both eyes 4 (four) times daily as needed for eye irritation.  . pantoprazole  (PROTONIX) 40 MG tablet Take 1 tablet (40 mg total) by mouth 2 (two) times daily.  . tamsulosin (FLOMAX) 0.4 MG CAPS capsule Take 2 capsules (0.8 mg total) by mouth daily.  Marland Kitchen atorvastatin (LIPITOR) 20 MG tablet Take 1 tablet (20 mg total) by mouth daily at 6 PM.  . dextromethorphan-guaiFENesin (MUCINEX DM) 30-600 MG 12hr tablet Take 1 tablet by mouth 2 (two) times daily.  . feeding supplement, ENSURE ENLIVE, (ENSURE ENLIVE) LIQD Take 237 mLs by mouth 2 (two) times daily between meals.  . Multiple Vitamin (MULTIVITAMIN WITH MINERALS) TABS tablet Take 1 tablet by mouth daily.   No facility-administered encounter medications on file as of 06/18/2019.     PHYSICAL EXAM:   General: NAD, frail appearing Cardiovascular: regular rate and rhythm Pulmonary: faint inspiratory wheeze left upper lobe, otherwise clear lung sounds Abdomen: soft, nontender, + bowel sounds GU: no suprapubic tenderness Extremities: trace edema, no joint deformities Skin: no rashes Neurological: Weakness but otherwise nonfocal  Ezekiel Slocumb, NP

## 2019-07-26 ENCOUNTER — Ambulatory Visit (INDEPENDENT_AMBULATORY_CARE_PROVIDER_SITE_OTHER): Payer: Medicare Other | Admitting: Vascular Surgery

## 2019-07-26 ENCOUNTER — Encounter (INDEPENDENT_AMBULATORY_CARE_PROVIDER_SITE_OTHER): Payer: Medicare Other

## 2019-08-06 ENCOUNTER — Ambulatory Visit (INDEPENDENT_AMBULATORY_CARE_PROVIDER_SITE_OTHER): Payer: Medicare Other | Admitting: Vascular Surgery

## 2019-08-06 ENCOUNTER — Encounter (INDEPENDENT_AMBULATORY_CARE_PROVIDER_SITE_OTHER): Payer: Self-pay | Admitting: Vascular Surgery

## 2019-08-06 ENCOUNTER — Ambulatory Visit (INDEPENDENT_AMBULATORY_CARE_PROVIDER_SITE_OTHER): Payer: Medicare Other

## 2019-08-06 ENCOUNTER — Other Ambulatory Visit: Payer: Self-pay

## 2019-08-06 VITALS — BP 138/60 | HR 75 | Resp 16 | Ht 66.0 in | Wt 153.0 lb

## 2019-08-06 DIAGNOSIS — I6523 Occlusion and stenosis of bilateral carotid arteries: Secondary | ICD-10-CM

## 2019-08-06 DIAGNOSIS — E7800 Pure hypercholesterolemia, unspecified: Secondary | ICD-10-CM | POA: Diagnosis not present

## 2019-08-06 DIAGNOSIS — I10 Essential (primary) hypertension: Secondary | ICD-10-CM

## 2019-08-06 DIAGNOSIS — I739 Peripheral vascular disease, unspecified: Secondary | ICD-10-CM

## 2019-08-06 DIAGNOSIS — J449 Chronic obstructive pulmonary disease, unspecified: Secondary | ICD-10-CM | POA: Diagnosis not present

## 2019-08-06 NOTE — Assessment & Plan Note (Signed)
ABIs are stable to slightly improved at 1.02 on the right and 1.01 on the left.  No limb threatening or life-threatening symptoms.  Recheck in 1 year

## 2019-08-06 NOTE — Assessment & Plan Note (Signed)
Carotid duplex shows stable 40 to 59% stenosis on the right and stable 1 to 39% stenosis on the left although the common carotid velocities are slightly worse than this.  No role for intervention at this level.  No focal symptoms.  Recheck in 1 year

## 2019-08-06 NOTE — Progress Notes (Signed)
MRN : [id number]  Wesley Newton is a 85 y.o. (01-May-1927) male who presents with chief complaint of  Chief Complaint  Patient presents with  . Follow-up    ultrasound follow up  .  History of Present Illness: Patient returns in follow-up of multiple vascular issues.  He has recently been hospitalized with atrial fibrillation and is now on anticoagulation.  He has not any focal neurologic deficits.  No limb threatening ischemia, ischemic rest pain, ulceration, or infection. ABIs are stable to slightly improved at 1.02 on the right and 1.01 on the left.  Carotid duplex shows stable 40 to 59% stenosis on the right and stable 1 to 39% stenosis on the left although the common carotid velocities are slightly worse than this.    Current Outpatient Medications  Medication Sig Dispense Refill  . albuterol (PROVENTIL HFA;VENTOLIN HFA) 108 (90 Base) MCG/ACT inhaler Inhale 2 puffs into the lungs every 6 (six) hours as needed for wheezing or shortness of breath.    Marland Kitchen amiodarone (PACERONE) 200 MG tablet Take 1 tablet (200 mg total) by mouth 2 (two) times daily. (Patient taking differently: Take 200 mg by mouth daily. )    . apixaban (ELIQUIS) 2.5 MG TABS tablet Take 1 tablet (2.5 mg total) by mouth 2 (two) times daily. 60 tablet   . aspirin EC 81 MG tablet Take 81 mg by mouth daily.     Marland Kitchen atorvastatin (LIPITOR) 20 MG tablet Take 1 tablet (20 mg total) by mouth daily at 6 PM.    . azelastine (ASTELIN) 0.1 % nasal spray Place 1 spray into both nostrils daily.    . budesonide (PULMICORT) 0.5 MG/2ML nebulizer solution Take 0.5 mg by nebulization daily.    Marland Kitchen dextromethorphan-guaiFENesin (MUCINEX DM) 30-600 MG 12hr tablet Take 1 tablet by mouth 2 (two) times daily.    . feeding supplement, ENSURE ENLIVE, (ENSURE ENLIVE) LIQD Take 237 mLs by mouth 2 (two) times daily between meals. 237 mL 12  . finasteride (PROSCAR) 5 MG tablet Take 1 tablet (5 mg total) by mouth daily. 90 tablet 3  . gabapentin  (NEURONTIN) 100 MG capsule Take 100-200 mg by mouth 2 (two) times daily. Take 2capsules (200mg ) by mouth every night and 1 capsule (100mg ) by mouth every morning.    Marland Kitchen guaiFENesin (MUCINEX) 600 MG 12 hr tablet Take 1 tablet (600 mg total) by mouth 2 (two) times daily.    Marland Kitchen ipratropium-albuterol (DUONEB) 0.5-2.5 (3) MG/3ML SOLN Inhale 3 mLs into the lungs 4 (four) times daily.    Marland Kitchen LORazepam (ATIVAN) 0.5 MG tablet Take 1 tablet (0.5 mg total) by mouth daily as needed for anxiety. 15 tablet 1  . midodrine (PROAMATINE) 10 MG tablet Take 1 tablet (10 mg total) by mouth 3 (three) times daily with meals.    . Multiple Vitamin (MULTIVITAMIN WITH MINERALS) TABS tablet Take 1 tablet by mouth daily.    . Multiple Vitamins-Minerals (OCUVITE ADULT 50+ PO) Take 1 capsule by mouth daily.    . naphazoline-glycerin (CLEAR EYES REDNESS) 0.012-0.2 % SOLN Place 1-2 drops into both eyes 4 (four) times daily as needed for eye irritation.  0  . pantoprazole (PROTONIX) 40 MG tablet Take 1 tablet (40 mg total) by mouth 2 (two) times daily.    . tamsulosin (FLOMAX) 0.4 MG CAPS capsule Take 2 capsules (0.8 mg total) by mouth daily. 180 capsule 3   No current facility-administered medications for this visit.     Past Medical History:  Diagnosis Date  . Asthma   . BPH (benign prostatic hyperplasia)   . Bronchitis   . Cancer ([place])   . COPD (chronic obstructive pulmonary disease) ([place])   . DVT of leg (deep venous thrombosis) ([place])   . Elevated PSA   . Emphysema lung ([place])   . Emphysema of lung ([place])   . Hyperlipidemia   . Nocturia   . Pulmonary fibrosis ([place])   . Pulmonary fibrosis ([place])   . Skin cancer     Past Surgical History:  Procedure Laterality Date  . CATARACT EXTRACTION    . HERNIA REPAIR    . TONSILLECTOMY      Social History  Substance Use Topics  . Smoking status: Former Research scientist (life sciences)  . Smokeless tobacco: Never Used     Comment: quit 40 years  . Alcohol use No     Family History       Family History  Problem Relation Age of Onset  . Heart Problems Brother   . Kidney disease Neg Hx   . Prostate cancer Neg Hx           Allergies  Allergen Reactions  . Codeine Nausea And Vomiting  . Rapaflo [Silodosin] Other (See Comments)    Reaction: Unknown   . Tramadol Nausea And Vomiting and Other (See Comments)    Pt states that this medication makes him feel crazy.     REVIEW OF SYSTEMS(Negative unless checked)  Constitutional: [] ?Weight loss[] ?Fever[] ?Chills Cardiac:[] ?Chest pain[] ?Chest pressure[] ?Palpitations [] ?Shortness of breath when laying flat [] ?Shortness of breath at rest [] ?Shortness of breath with exertion. Vascular: [x] ?Pain in legs with walking[] ?Pain in legsat rest[] ?Pain in legs when laying flat [x] ?Claudication [] ?Pain in feet when walking [] ?Pain in feet at rest [] ?Pain in feet when laying flat [] ?History of DVT [] ?Phlebitis [] ?Swelling in legs [] ?Varicose veins [] ?Non-healing ulcers Pulmonary: [] ?Uses home oxygen [] ?Productive cough[] ?Hemoptysis [] ?Wheeze [] ?COPD [] ?Asthma Neurologic: [] ?Dizziness [] ?Blackouts [] ?Seizures [] ?History of stroke [] ?History of TIA[] ?Aphasia [] ?Temporary blindness[] ?Dysphagia [] ?Weaknessor numbness in arms [] ?Weakness or numbnessin legs Musculoskeletal: [x] ?Arthritis [] ?Joint swelling [] ?Joint pain [] ?Low back pain Hematologic:[] ?Easy bruising[] ?Easy bleeding [] ?Hypercoagulable state [] ?Anemic [] ?Hepatitis Gastrointestinal:[] ?Blood in stool[] ?Vomiting blood[] ?Gastroesophageal reflux/heartburn[] ?Difficulty swallowing. Genitourinary: [] ?Chronic kidney disease [] ?Difficulturination [] ?Frequenturination [] ?Burning with urination[] ?Blood in urine Skin: [] ?Rashes [] ?Ulcers [] ?Wounds Psychological: [] ?History of anxiety[] ?History of major depression.    Physical Examination  Vitals:    08/06/19 1432  BP: 138/60  Pulse: 75  Resp: 16  Weight: 153 lb (69.4 kg)  Height: 5\' 6"  (1.676 m)   Body mass index is 24.69 kg/m. Gen:  WD/WN, NAD.  Appears younger than stated age Head: [place]/AT, No temporalis wasting. Ear/Nose/Throat: Hearing grossly intact, nares w/o erythema or drainage, trachea midline Eyes: Conjunctiva clear. Sclera non-icteric Neck: Supple.  Soft left carotid bruit  Pulmonary:  Good air movement, equal and clear to auscultation bilaterally.  Cardiac: Irregular Vascular:  Vessel Right Left  Radial Palpable Palpable                          PT  1+ palpable  1+ palpable  DP  2+ palpable  1+ palpable    Musculoskeletal: M/S 5/5 throughout.  No deformity or atrophy.  No edema. Neurologic: CN 2-12 intact. Sensation grossly intact in extremities.  Symmetrical.  Speech is fluent. Motor exam as listed above. Psychiatric: Judgment intact, Mood & affect appropriate for pt's clinical situation. Dermatologic: No rashes or ulcers noted.  No cellulitis or open wounds.      CBC Lab Results  Component Value Date  WBC 12.4 (H) 05/04/2019   HGB 13.7 05/04/2019   HCT 41.4 05/04/2019   MCV 83.0 05/04/2019   PLT 328 05/04/2019    BMET    Component Value Date/Time   NA 139 05/04/2019 2213   NA 138 12/12/2012 0835   K 4.2 05/04/2019 2213   K 4.0 12/12/2012 0835   CL 102 05/04/2019 2213   CL 105 12/12/2012 0835   CO2 29 05/04/2019 2213   CO2 26 12/12/2012 0835   GLUCOSE 113 (H) 05/04/2019 2213   GLUCOSE 112 (H) 12/12/2012 0835   BUN 23 05/04/2019 2213   BUN 17 12/12/2012 0835   CREATININE 1.06 05/04/2019 2213   CREATININE 1.26 12/12/2012 0835   CALCIUM 9.4 05/04/2019 2213   CALCIUM 9.4 12/12/2012 0835   GFRNONAA >60 05/04/2019 2213   GFRNONAA 52 (L) 12/12/2012 0835   GFRAA >60 05/04/2019 2213   GFRAA >60 12/12/2012 0835   CrCl cannot be calculated (Patient's most recent lab result is older than the maximum 21 days allowed.).  COAG Lab Results   Component Value Date   INR 0.98 09/03/2015    Radiology No results found.    Assessment/Plan Hypercholesterolemia lipid control important in reducing the progression of atherosclerotic disease. Continue statin therapy   BP (high blood pressure) blood pressure control important in reducing the progression of atherosclerotic disease. On appropriate oral medications.  No problem-specific Assessment & Plan notes found for this encounter.    Leotis Pain, MD  08/06/2019 2:35 PM    This note was created with Dragon medical transcription system.  Any errors from dictation are purely unintentional

## 2019-08-06 NOTE — Assessment & Plan Note (Signed)
Continue pulmonary medications and aerosols as already ordered, these medications have been reviewed and there are no changes at this time.   

## 2019-08-22 ENCOUNTER — Telehealth: Payer: Self-pay | Admitting: Adult Health Nurse Practitioner

## 2019-08-22 ENCOUNTER — Ambulatory Visit: Payer: Medicare Other | Admitting: Gastroenterology

## 2019-08-22 ENCOUNTER — Encounter: Payer: Self-pay | Admitting: Gastroenterology

## 2019-08-22 ENCOUNTER — Other Ambulatory Visit: Payer: Self-pay

## 2019-08-22 VITALS — BP 130/80 | HR 77 | Temp 98.2°F | Wt 154.4 lb

## 2019-08-22 DIAGNOSIS — R1312 Dysphagia, oropharyngeal phase: Secondary | ICD-10-CM

## 2019-08-22 NOTE — Telephone Encounter (Signed)
Spoke with patient and I have rescheduled his 08/20/19 f/u visit to 09/02/19 @ 3 PM.

## 2019-08-25 NOTE — Progress Notes (Signed)
[name], MD [address]  [place]  [place], [place] [address]  Main: [phone]  Fax: [phone]   Primary Care Physician: Baxter Hire, MD   Chief Complaint  Patient presents with   Dysphagia    not having no symptoms     HPI: Wesley Newton is a 85 y.o. male here for follow-up of dysphagia.  Patient was previously seen and symptoms were considered to be oropharyngeal.  Patient describes symptoms of significantly improved to completely resolved with following speech and swallow therapy recommendations. The patient denies abdominal or flank pain, anorexia, nausea or vomiting, dysphagia, change in bowel habits or black or bloody stools or weight loss.  He was previously evaluated at the time of hospitalization.At the time of his admission patient was also considered high risk for upper endoscopy with sedation due to A. fib with RVR on anticoagulation on that admission, underlying pulmonary fibrosis, advanced age, and already underlying diagnosis of oropharyngeal dysphagia, does making risks of procedure higher than benefits at the time.  Speech pathology note on last admission, has cleared the patient for dysphagia 3 diet with thin liquids with strict aspiration precautions. Last modified barium swallow was done in August 2019 and impression was "Flash laryngeal penetration of the barium was seen in multiple phases of the study resulting in a cough. No residual barium was Observed."   An upper GI study was done that reported esophageal spasm and GERD but no narrowing or strictures or other abnormalities.   Current Outpatient Medications  Medication Sig Dispense Refill   albuterol (PROVENTIL HFA;VENTOLIN HFA) 108 (90 Base) MCG/ACT inhaler Inhale 2 puffs into the lungs every 6 (six) hours as needed for wheezing or shortness of breath.     amiodarone (PACERONE) 200 MG tablet Take 1 tablet (200 mg total) by mouth 2 (two) times daily. (Patient taking  differently: Take 200 mg by mouth daily. )     apixaban (ELIQUIS) 2.5 MG TABS tablet Take 1 tablet (2.5 mg total) by mouth 2 (two) times daily. 60 tablet    aspirin EC 81 MG tablet Take 81 mg by mouth daily.      atorvastatin (LIPITOR) 20 MG tablet Take 1 tablet (20 mg total) by mouth daily at 6 PM.     azelastine (ASTELIN) 0.1 % nasal spray Place 1 spray into both nostrils daily.     budesonide (PULMICORT) 0.5 MG/2ML nebulizer solution Take 0.5 mg by nebulization daily.     dextromethorphan-guaiFENesin (MUCINEX DM) 30-600 MG 12hr tablet Take 1 tablet by mouth 2 (two) times daily.     feeding supplement, ENSURE ENLIVE, (ENSURE ENLIVE) LIQD Take 237 mLs by mouth 2 (two) times daily between meals. 237 mL 12   finasteride (PROSCAR) 5 MG tablet Take 1 tablet (5 mg total) by mouth daily. 90 tablet 3   gabapentin (NEURONTIN) 100 MG capsule Take 100-200 mg by mouth 2 (two) times daily. Take 2capsules (200mg ) by mouth every night and 1 capsule (100mg ) by mouth every morning.     guaiFENesin (MUCINEX) 600 MG 12 hr tablet Take 1 tablet (600 mg total) by mouth 2 (two) times daily.     ipratropium-albuterol (DUONEB) 0.5-2.5 (3) MG/3ML SOLN Inhale 3 mLs into the lungs 4 (four) times daily.     LORazepam (ATIVAN) 0.5 MG tablet Take 1 tablet (0.5 mg total) by mouth daily as needed for anxiety. 15 tablet 1   midodrine (PROAMATINE) 10 MG tablet Take 1 tablet (10 mg total) by  mouth 3 (three) times daily with meals.     Multiple Vitamin (MULTIVITAMIN WITH MINERALS) TABS tablet Take 1 tablet by mouth daily.     Multiple Vitamins-Minerals (OCUVITE ADULT 50+ PO) Take 1 capsule by mouth daily.     naphazoline-glycerin (CLEAR EYES REDNESS) 0.012-0.2 % SOLN Place 1-2 drops into both eyes 4 (four) times daily as needed for eye irritation.  0   pantoprazole (PROTONIX) 40 MG tablet Take 1 tablet (40 mg total) by mouth 2 (two) times daily.     tamsulosin (FLOMAX) 0.4 MG CAPS capsule Take 2 capsules (0.8 mg  total) by mouth daily. 180 capsule 3   No current facility-administered medications for this visit.     Allergies as of 08/22/2019 - Review Complete 08/22/2019  Allergen Reaction Noted   Amoxicillin Other (See Comments) 08/12/2017   Codeine Nausea And Vomiting 12/25/2015   Rapaflo [silodosin] Other (See Comments) 12/25/2015   Tramadol Nausea And Vomiting and Other (See Comments) 09/03/2015   Trimethoprim Other (See Comments) 08/12/2017    ROS:  General: Negative for anorexia, weight loss, fever, chills, fatigue, weakness. ENT: Negative for hoarseness, difficulty swallowing , nasal congestion. CV: Negative for chest pain, angina, palpitations, dyspnea on exertion, peripheral edema.  Respiratory: Negative for dyspnea at rest, dyspnea on exertion, cough, sputum, wheezing.  GI: See history of present illness. GU:  Negative for dysuria, hematuria, urinary incontinence, urinary frequency, nocturnal urination.  Endo: Negative for unusual weight change.    Physical Examination:   BP 130/80 (BP Location: Left Arm, Patient Position: Sitting, Cuff Size: Normal)    Pulse 77    Temp 98.2 F (36.8 C) (Oral)    Wt 154 lb 6 oz (70 kg)    BMI 24.92 kg/m   General: Well-nourished, well-developed in no acute distress.  Eyes: No icterus. Conjunctivae pink. Mouth: Oropharyngeal mucosa moist and pink , no lesions erythema or exudate. Neck: Supple, Trachea midline Abdomen: Bowel sounds are normal, nontender, nondistended, no hepatosplenomegaly or masses, no abdominal bruits or hernia , no rebound or guarding.   Extremities: No lower extremity edema. No clubbing or deformities. Neuro: Alert and oriented x 3.  Grossly intact. Skin: Warm and dry, no jaundice.   Psych: Alert and cooperative, normal mood and affect.   Labs: CMP     Component Value Date/Time   NA 139 05/04/2019 2213   NA 138 12/12/2012 0835   K 4.2 05/04/2019 2213   K 4.0 12/12/2012 0835   CL 102 05/04/2019 2213   CL 105  12/12/2012 0835   CO2 29 05/04/2019 2213   CO2 26 12/12/2012 0835   GLUCOSE 113 (H) 05/04/2019 2213   GLUCOSE 112 (H) 12/12/2012 0835   BUN 23 05/04/2019 2213   BUN 17 12/12/2012 0835   CREATININE 1.06 05/04/2019 2213   CREATININE 1.26 12/12/2012 0835   CALCIUM 9.4 05/04/2019 2213   CALCIUM 9.4 12/12/2012 0835   PROT 7.3 05/04/2019 2213   PROT 7.8 12/12/2012 0835   ALBUMIN 3.7 05/04/2019 2213   ALBUMIN 3.8 12/12/2012 0835   AST 26 05/04/2019 2213   AST 20 12/12/2012 0835   ALT 22 05/04/2019 2213   ALT 25 12/12/2012 0835   ALKPHOS 93 05/04/2019 2213   ALKPHOS 94 12/12/2012 0835   BILITOT 0.5 05/04/2019 2213   BILITOT 0.5 12/12/2012 0835   GFRNONAA >60 05/04/2019 2213   GFRNONAA 52 (L) 12/12/2012 0835   GFRAA >60 05/04/2019 2213   GFRAA >60 12/12/2012 0835   Lab Results  Component  Value Date   WBC 12.4 (H) 05/04/2019   HGB 13.7 05/04/2019   HCT 41.4 05/04/2019   MCV 83.0 05/04/2019   PLT 328 05/04/2019    Imaging Studies: Vas [place] Abi With/wo Tbi  Result Date: 08/06/2019 LOWER EXTREMITY DOPPLER STUDY Indications: Peripheral artery disease.  Vascular Interventions: None. Comparison Study: 07/24/2018 Performing Technologist: Almira Coaster RVS  Examination Guidelines: A complete evaluation includes at minimum, Doppler waveform signals and systolic blood pressure reading at the level of bilateral brachial, anterior tibial, and posterior tibial arteries, when vessel segments are accessible. Bilateral testing is considered an integral part of a complete examination. Photoelectric Plethysmograph (PPG) waveforms and toe systolic pressure readings are included as required and additional duplex testing as needed. Limited examinations for reoccurring indications may be performed as noted.  ABI Findings: +---------+------------------+-----+--------+--------+  Right     Rt Pressure (mmHg) Index Waveform Comment   +---------+------------------+-----+--------+--------+  Brachial  121                                          +---------+------------------+-----+--------+--------+  ATA       121                1.00  biphasic           +---------+------------------+-----+--------+--------+  PTA       124                1.02  biphasic           +---------+------------------+-----+--------+--------+  Great Toe 87                 0.72  Normal             +---------+------------------+-----+--------+--------+ +---------+------------------+-----+----------+--------+  Left      Lt Pressure (mmHg) Index Waveform   Comment   +---------+------------------+-----+----------+--------+  Brachial  119                                           +---------+------------------+-----+----------+--------+  ATA       117                0.97  monophasic           +---------+------------------+-----+----------+--------+  PTA       0                  0.00  absent     Occluded  +---------+------------------+-----+----------+--------+  PERO      122                1.01  biphasic             +---------+------------------+-----+----------+--------+  Great Toe 73                 0.60  Abnormal             +---------+------------------+-----+----------+--------+ +-------+-----------+-----------+------------+------------+  ABI/TBI Today's ABI Today's TBI Previous ABI Previous TBI  +-------+-----------+-----------+------------+------------+  Right   1.02        .72         .99          .63           +-------+-----------+-----------+------------+------------+  Left    1.01        .60         .  68          .40           +-------+-----------+-----------+------------+------------+ Left ABIs appear increased compared to prior study on 07/24/2018. Bilateral TBIs appear increased compared to prior study on 07/24/2018.  Summary: Right: Resting right ankle-brachial index is within normal range. No evidence of significant right lower extremity arterial disease. The right toe-brachial index is normal. Left: Resting left ankle-brachial index is within  normal range. No evidence of significant left lower extremity arterial disease. The left toe-brachial index is abnormal.  *See table(s) above for measurements and observations.  Electronically signed by Leotis Pain MD on 08/06/2019 at 3:52:36 PM.    Final    Vas US Carotid  Result Date: 08/06/2019 Carotid Arterial Duplex Study Indications: Carotid artery disease. Performing Technologist: Almira Coaster RVS  Examination Guidelines: A complete evaluation includes B-mode imaging, spectral Doppler, color Doppler, and power Doppler as needed of all accessible portions of each vessel. Bilateral testing is considered an integral part of a complete examination. Limited examinations for reoccurring indications may be performed as noted.  Right Carotid Findings: +----------+--------+--------+--------+------------------+--------+             PSV cm/s EDV cm/s Stenosis Plaque Description Comments  +----------+--------+--------+--------+------------------+--------+  CCA Prox   152      0                                              +----------+--------+--------+--------+------------------+--------+  CCA Mid    149      0                                              +----------+--------+--------+--------+------------------+--------+  CCA Distal 113      0                                              +----------+--------+--------+--------+------------------+--------+  ICA Prox   74       8                                              +----------+--------+--------+--------+------------------+--------+  ICA Mid    131      16                                             +----------+--------+--------+--------+------------------+--------+  ICA Distal 81       11                                             +----------+--------+--------+--------+------------------+--------+  ECA        162      0                                              +----------+--------+--------+--------+------------------+--------+  +----------+--------+-------+--------+-------------------+  PSV cm/s EDV cms Describe Arm Pressure (mmHG)  +----------+--------+-------+--------+-------------------+  Subclavian 123      0                                     +----------+--------+-------+--------+-------------------+ +---------+--------+--+--------+-+  Vertebral PSV cm/s 67 EDV cm/s 0  +---------+--------+--+--------+-+  Left Carotid Findings: +----------+--------+--------+--------+------------------+--------+             PSV cm/s EDV cm/s Stenosis Plaque Description Comments  +----------+--------+--------+--------+------------------+--------+  CCA Prox   154      0                                              +----------+--------+--------+--------+------------------+--------+  CCA Mid    176      0                                              +----------+--------+--------+--------+------------------+--------+  CCA Distal 143      0                                              +----------+--------+--------+--------+------------------+--------+  ICA Prox   80       13                                             +----------+--------+--------+--------+------------------+--------+  ICA Mid    107      16                                             +----------+--------+--------+--------+------------------+--------+  ICA Distal 90       17                                             +----------+--------+--------+--------+------------------+--------+  ECA        145      8                                              +----------+--------+--------+--------+------------------+--------+ +----------+--------+--------+--------+-------------------+  Subclavian PSV cm/s EDV cm/s Describe Arm Pressure (mmHG)  +----------+--------+--------+--------+-------------------+             170      0                                      +----------+--------+--------+--------+-------------------+ +---------+--------+--+--------+  Vertebral PSV cm/s 73 EDV cm/s   +---------+--------+--+--------+  Summary: Right Carotid: Velocities in the right ICA are consistent with a 40-59%  stenosis. Left Carotid: Velocities in the left ICA are consistent with a 1-39% stenosis. Vertebrals:  Bilateral vertebral arteries demonstrate antegrade flow. Subclavians: Normal flow hemodynamics were seen in bilateral subclavian              arteries. *See table(s) above for measurements and observations.  Electronically signed by Leotis Pain MD on 08/06/2019 at 3:52:41 PM.    Final     Assessment and Plan:   Wesley Newton is a 85 y.o. y/o male here for follow-up of oropharyngeal dysphasia  Symptoms are now resolved as patient states he is following speech and swallowing techniques and diet that he was recommended with no symptoms  He remains uninterested and refuses any upper endoscopies  He has no complaints or concerns otherwise If symptoms return he was asked to contact us  Follow-up as needed    Dr [name]

## 2019-09-02 ENCOUNTER — Other Ambulatory Visit: Payer: Self-pay

## 2019-09-02 ENCOUNTER — Other Ambulatory Visit: Payer: Medicare Other | Admitting: Adult Health Nurse Practitioner

## 2019-09-02 DIAGNOSIS — Z515 Encounter for palliative care: Secondary | ICD-10-CM

## 2019-09-02 NOTE — Progress Notes (Signed)
[facility] Consult Note Telephone: [phone]  Fax: [phone]  PATIENT NAME: Wesley Newton DOB: 02-17-1927 MRN: [id number]  PRIMARY CARE PROVIDER:   Baxter Hire, MD  REFERRING PROVIDER:  Baxter Hire, MD [place],  [place] [address]  RESPONSIBLE PARTY:  Granddaughter Audra-HCPOA        RECOMMENDATIONS and PLAN:  1.  Advanced care planning.  Patient is a DNR  2.  Left knee pain.  Patient states that about a month ago after he was exercising he noticed pain in his left knee.  States he didn't feel any pain during the exercise and does not recall hurting it.  Denies falls or trauma.  States that it will swell from time to time and that he is not always able to fully extend the left knee.  He states that it feels tight.  States that since the pain started he has not been able to do his exercises or walk like he used to.  Did notice him limping and favoring the left knee today.  States that he has tried warm and cold treatments with relief and Tylenol helps with the pain.  States that he has an appointment with PCP next week.  Have reached out via Epic email to PCP with concerns.   Patient has no other concerns today.  Have a follow up in 2 months and have given him my contact info and encouraged to call with any concerns   I spent 45 minutes providing this consultation,  from 3:30 to 4:15. More than 50% of the time in this consultation was spent coordinating communication.   HISTORY OF PRESENT ILLNESS:  Wesley Newton is a 85 y.o. year old male with multiple medical problems including COPD, pulmonary emphysema, chronic cough, dyspnea on exertion, asthmatic bronchitis, diastolic heart failure, hypertension, hypercholesterolemia, gerd, rhinitis, anemia, BPH, lumbago. Palliative Care was asked to help address goals of care.   CODE STATUS: DNR  PPS: 60% HOSPICE ELIGIBILITY/DIAGNOSIS: TBD  PHYSICAL EXAM:    General: patient in apartment in NAD Cardiovascular: regular rate and rhythm Pulmonary: clear ant fields Extremities: no edema, arthritic changes to hands;  Patient was limping and favoring the left leg when he walked Skin: no rashes on exposed skin Neurological: Weakness but otherwise nonfocal   PAST MEDICAL HISTORY:  Past Medical History:  Diagnosis Date  . Asthma   . BPH (benign prostatic hyperplasia)   . Bronchitis   . Cancer ([facility])   . COPD (chronic obstructive pulmonary disease) ([facility])   . DVT of leg (deep venous thrombosis) ([facility])   . Elevated PSA   . Emphysema lung ([facility])   . Emphysema of lung ([facility])   . Hyperlipidemia   . Nocturia   . Pulmonary fibrosis ([facility])   . Pulmonary fibrosis ([facility])   . Skin cancer     SOCIAL HX:  Social History   Tobacco Use  . Smoking status: Former Research scientist (life sciences)  . Smokeless tobacco: Never Used  . Tobacco comment: quit 40 years  Substance Use Topics  . Alcohol use: No    Alcohol/week: 0.0 standard drinks    ALLERGIES:  Allergies  Allergen Reactions  . Amoxicillin Other (See Comments)    DID THE REACTION INVOLVE: Swelling of the face/tongue/throat, SOB, or low BP? Unknown Sudden or severe rash/hives, skin peeling, or the inside of the mouth or nose? Unknown Did it require medical treatment? Unknown When did it last happen?Unknown If all above  answers are "NO", may proceed with cephalosporin use.   . Codeine Nausea And Vomiting  . Rapaflo [Silodosin] Other (See Comments)    Reaction:  Unknown   . Tramadol Nausea And Vomiting and Other (See Comments)    Pt states that this medication makes him feel crazy.    . Trimethoprim Other (See Comments)    Reaction: unknown     PERTINENT MEDICATIONS:  Outpatient Encounter Medications as of 85  Medication Sig  . albuterol (PROVENTIL HFA;VENTOLIN HFA) 108 (90 Base) MCG/ACT inhaler Inhale 2 puffs into the lungs every 6 (six) hours as needed for wheezing or shortness of breath.  Marland Kitchen amiodarone  (PACERONE) 200 MG tablet Take 1 tablet (200 mg total) by mouth 2 (two) times daily. (Patient taking differently: Take 200 mg by mouth daily. )  . apixaban (ELIQUIS) 2.5 MG TABS tablet Take 1 tablet (2.5 mg total) by mouth 2 (two) times daily.  Marland Kitchen aspirin EC 81 MG tablet Take 81 mg by mouth daily.   Marland Kitchen atorvastatin (LIPITOR) 20 MG tablet Take 1 tablet (20 mg total) by mouth daily at 6 PM.  . azelastine (ASTELIN) 0.1 % nasal spray Place 1 spray into both nostrils daily.  . budesonide (PULMICORT) 0.5 MG/2ML nebulizer solution Take 0.5 mg by nebulization daily.  Marland Kitchen dextromethorphan-guaiFENesin (MUCINEX DM) 30-600 MG 12hr tablet Take 1 tablet by mouth 2 (two) times daily.  . feeding supplement, ENSURE ENLIVE, (ENSURE ENLIVE) LIQD Take 237 mLs by mouth 2 (two) times daily between meals.  . finasteride (PROSCAR) 5 MG tablet Take 1 tablet (5 mg total) by mouth daily.  Marland Kitchen gabapentin (NEURONTIN) 100 MG capsule Take 100-200 mg by mouth 2 (two) times daily. Take 2capsules (200mg ) by mouth every night and 1 capsule (100mg ) by mouth every morning.  Marland Kitchen guaiFENesin (MUCINEX) 600 MG 12 hr tablet Take 1 tablet (600 mg total) by mouth 2 (two) times daily.  Marland Kitchen ipratropium-albuterol (DUONEB) 0.5-2.5 (3) MG/3ML SOLN Inhale 3 mLs into the lungs 4 (four) times daily.  Marland Kitchen LORazepam (ATIVAN) 0.5 MG tablet Take 1 tablet (0.5 mg total) by mouth daily as needed for anxiety.  . midodrine (PROAMATINE) 10 MG tablet Take 1 tablet (10 mg total) by mouth 3 (three) times daily with meals.  . Multiple Vitamin (MULTIVITAMIN WITH MINERALS) TABS tablet Take 1 tablet by mouth daily.  . Multiple Vitamins-Minerals (OCUVITE ADULT 50+ PO) Take 1 capsule by mouth daily.  . naphazoline-glycerin (CLEAR EYES REDNESS) 0.012-0.2 % SOLN Place 1-2 drops into both eyes 4 (four) times daily as needed for eye irritation.  . pantoprazole (PROTONIX) 40 MG tablet Take 1 tablet (40 mg total) by mouth 2 (two) times daily.  . tamsulosin (FLOMAX) 0.4 MG CAPS capsule  Take 2 capsules (0.8 mg total) by mouth daily.   No facility-administered encounter medications on file as of 09/02/2019.      Michaelah Credeur Jenetta Downer, NP

## 2019-09-05 ENCOUNTER — Telehealth: Payer: Self-pay

## 2019-09-05 NOTE — Telephone Encounter (Signed)
At the direction of NP, PCP office contacted to follow up regarding patient's reports of pain to leg. NP consult note also faxed to PCP office

## 2019-10-28 ENCOUNTER — Non-Acute Institutional Stay: Payer: Medicare Other | Admitting: Adult Health Nurse Practitioner

## 2019-10-28 ENCOUNTER — Other Ambulatory Visit: Payer: Self-pay

## 2019-10-28 DIAGNOSIS — J84112 Idiopathic pulmonary fibrosis: Secondary | ICD-10-CM

## 2019-10-28 DIAGNOSIS — Z515 Encounter for palliative care: Secondary | ICD-10-CM

## 2019-10-28 NOTE — Progress Notes (Signed)
[facility] Consult Note Telephone: [phone]  Fax: [phone]  PATIENT NAME: Wesley Newton DOB: 1927/08/15 MRN: [id number]  PRIMARY CARE PROVIDER:   Baxter Hire, MD  REFERRING PROVIDER:  Baxter Hire, MD [place],  [place] [address]  RESPONSIBLE PARTY:   Granddaughter Audra-HCPOA      RECOMMENDATIONS and PLAN:  1.  Advanced care planning. Patient is a DNR  2.  COPD/Pulmonary fibrosis.  Patient states that he has been doing well.  Does state to having congestion in the morning that goes away by lunch time.  Is on azelastine and flonase for allergies.  Suggested using a humidifier to help now that the weather is getting colder and using dry heat more.Also suggested using a saline nasal spray to keep the nasal passages moist. Denies increased SOB or cough, chest pain, fever. Continue follow up and recommendations by pulmonology.    3. CHF.  Patient is being seen by Dr. Jerrye Beavers, cardiology.  He states that he has been having some increased swelling in his ankles in the past 2-3 weeks.  Denies increased SOB or cough, chest pain, palpitations.  Sleeps with head of bed elevated and with 2 pillows but this is unchanged for a long time.  Denies any pain related to the swelling.  Denies any falls or trauma.  Instructed to call Dr. Jerrye Beavers if the edema worsens or if he starts having any increased SOB or cough, chest pain.  Will call to follow up in 2 weeks.  Our next appointment is in mid January.  Patient wanted to wait since he has several other provider visits coming up.  4.  Pain.  Patient states having pain in the morning that at times makes it hard to get out of bed.  States that he thinks it is arthritis.  Feels it in his hands, lower back, and knees.  States that once he gets up and going the pain goes away.  Tylenol does help with the pain.  Have encouraged using Tylenol at bedtime to see if that helps with his pain  in the morning.  Will continue to monitor the pain and make recommendations as needed.   I spent 60 minutes providing this consultation,  from 3:15 to 4:15. More than 50% of the time in this consultation was spent coordinating communication.   HISTORY OF PRESENT ILLNESS:  Wesley Newton is a 85 y.o. year old male with multiple medical problems including COPD, pulmonary emphysema, chronic cough, dyspnea on exertion, asthmatic bronchitis, diastolic heart failure, hypertension, hypercholesterolemia, gerd, rhinitis, anemia, BPH, lumbago. Palliative Care was asked to help address goals of care.   CODE STATUS: DNR  PPS: 60% HOSPICE ELIGIBILITY/DIAGNOSIS: TBD  PHYSICAL EXAM:   General: NAD, frail appearing, thin Cardiovascular: regular rate and rhythm Pulmonary: lung sounds diminished with some wheezing noted throughout; normal respiratory effort Abdomen: soft, nontender, + bowel sounds GU: no suprapubic tenderness Extremities: trace to 1+ pitting edema noted to ankles with more on the right than the left;  Arthritic changes in bilateral hands Skin: no rashes Neurological: Weakness but otherwise nonfocal   PAST MEDICAL HISTORY:  Past Medical History:  Diagnosis Date  . Asthma   . BPH (benign prostatic hyperplasia)   . Bronchitis   . Cancer ([facility])   . COPD (chronic obstructive pulmonary disease) ([place])   . DVT of leg (deep venous thrombosis) ([facility] [facility])   . Elevated PSA   . Emphysema lung ([facility])   .  Emphysema of lung ([facility])   . Hyperlipidemia   . Nocturia   . Pulmonary fibrosis ([place])   . Pulmonary fibrosis ([facility])   . Skin cancer     SOCIAL HX:  Social History   Tobacco Use  . Smoking status: Former Research scientist (life sciences)  . Smokeless tobacco: Never Used  . Tobacco comment: quit 40 years  Substance Use Topics  . Alcohol use: No    Alcohol/week: 0.0 standard drinks    ALLERGIES:  Allergies  Allergen Reactions  . Amoxicillin Other (See Comments)    DID THE REACTION INVOLVE: Swelling of the  face/tongue/throat, SOB, or low BP? Unknown Sudden or severe rash/hives, skin peeling, or the inside of the mouth or nose? Unknown Did it require medical treatment? Unknown When did it last happen?Unknown If all above answers are "NO", may proceed with cephalosporin use.   . Codeine Nausea And Vomiting  . Rapaflo [Silodosin] Other (See Comments)    Reaction:  Unknown   . Tramadol Nausea And Vomiting and Other (See Comments)    Pt states that this medication makes him feel crazy.    . Trimethoprim Other (See Comments)    Reaction: unknown     PERTINENT MEDICATIONS:  Outpatient Encounter Medications as of 10/28/2019  Medication Sig  . albuterol (PROVENTIL HFA;VENTOLIN HFA) 108 (90 Base) MCG/ACT inhaler Inhale 2 puffs into the lungs every 6 (six) hours as needed for wheezing or shortness of breath.  Marland Kitchen amiodarone (PACERONE) 200 MG tablet Take 1 tablet (200 mg total) by mouth 2 (two) times daily. (Patient taking differently: Take 200 mg by mouth daily. )  . apixaban (ELIQUIS) 2.5 MG TABS tablet Take 1 tablet (2.5 mg total) by mouth 2 (two) times daily.  Marland Kitchen aspirin EC 81 MG tablet Take 81 mg by mouth daily.   Marland Kitchen atorvastatin (LIPITOR) 20 MG tablet Take 1 tablet (20 mg total) by mouth daily at 6 PM.  . azelastine (ASTELIN) 0.1 % nasal spray Place 1 spray into both nostrils daily.  . budesonide (PULMICORT) 0.5 MG/2ML nebulizer solution Take 0.5 mg by nebulization daily.  Marland Kitchen dextromethorphan-guaiFENesin (MUCINEX DM) 30-600 MG 12hr tablet Take 1 tablet by mouth 2 (two) times daily.  . feeding supplement, ENSURE ENLIVE, (ENSURE ENLIVE) LIQD Take 237 mLs by mouth 2 (two) times daily between meals.  . finasteride (PROSCAR) 5 MG tablet Take 1 tablet (5 mg total) by mouth daily.  Marland Kitchen gabapentin (NEURONTIN) 100 MG capsule Take 100-200 mg by mouth 2 (two) times daily. Take 2capsules (200mg ) by mouth every night and 1 capsule (100mg ) by mouth every morning.  Marland Kitchen guaiFENesin (MUCINEX) 600 MG 12 hr tablet Take 1  tablet (600 mg total) by mouth 2 (two) times daily.  Marland Kitchen ipratropium-albuterol (DUONEB) 0.5-2.5 (3) MG/3ML SOLN Inhale 3 mLs into the lungs 4 (four) times daily.  Marland Kitchen LORazepam (ATIVAN) 0.5 MG tablet Take 1 tablet (0.5 mg total) by mouth daily as needed for anxiety.  . midodrine (PROAMATINE) 10 MG tablet Take 1 tablet (10 mg total) by mouth 3 (three) times daily with meals.  . Multiple Vitamin (MULTIVITAMIN WITH MINERALS) TABS tablet Take 1 tablet by mouth daily.  . Multiple Vitamins-Minerals (OCUVITE ADULT 50+ PO) Take 1 capsule by mouth daily.  . naphazoline-glycerin (CLEAR EYES REDNESS) 0.012-0.2 % SOLN Place 1-2 drops into both eyes 4 (four) times daily as needed for eye irritation.  . pantoprazole (PROTONIX) 40 MG tablet Take 1 tablet (40 mg total) by mouth 2 (two) times daily.  . tamsulosin (FLOMAX)  0.4 MG CAPS capsule Take 2 capsules (0.8 mg total) by mouth daily.   No facility-administered encounter medications on file as of 10/28/2019.      Kaelen Caughlin Jenetta Downer, NP

## 2019-11-11 ENCOUNTER — Telehealth: Payer: Self-pay

## 2019-11-11 ENCOUNTER — Other Ambulatory Visit: Payer: Self-pay

## 2019-11-11 ENCOUNTER — Non-Acute Institutional Stay: Payer: Medicare Other | Admitting: Adult Health Nurse Practitioner

## 2019-11-11 DIAGNOSIS — J84112 Idiopathic pulmonary fibrosis: Secondary | ICD-10-CM

## 2019-11-11 DIAGNOSIS — Z515 Encounter for palliative care: Secondary | ICD-10-CM

## 2019-11-11 NOTE — Telephone Encounter (Signed)
At the direction of NP, Dr. Etta Quill office contacted to provide update on Palliative care visit including patient's report of pain and edema to legs.

## 2019-11-11 NOTE — Progress Notes (Signed)
[facility] Consult Note Telephone: [phone]  Fax: [phone]  PATIENT NAME: Wesley Newton DOB: 1927/06/08 MRN: [id number]  PRIMARY CARE PROVIDER:   Baxter Hire, MD  REFERRING PROVIDER:  Baxter Hire, MD [place],  [place] [address]  RESPONSIBLE PARTY:  Self H: [phone] C: [phone]  Granddaughter Audra-HCPOA    Due to the COVID-19 crisis, this telephone evaluation and treatment contact was done via telephone and it was initiated and consent by this patient and or family.  RECOMMENDATIONS and PLAN:  1. Advanced care planning.  Patient is a DNR  2.  CHF.  Called to follow up on patient's edema.  Patient has appointment with Dr. Jerrye Beavers, cardiology, on Dec. 8, 2020.  States that he has been wearing TED hose and that the edema has improved. States having increased cough and congestion with the change in weather. Denies fever, increased SOB, chest pain. States that over the past 3-4 weeks he has had pain in his calves with prolonged walking.  States that it is relieved when he rests.  Will reach out to Dr. Etta Quill office with this concern and see if he wants to see the patient sooner.  I spent 15 minutes providing this consultation,  from 11:30 to 11:45. More than 50% of the time in this consultation was spent coordinating communication.   HISTORY OF PRESENT ILLNESS:  Wesley Newton is a 85 y.o. year old male with multiple medical problems including COPD, pulmonary emphysema, chronic cough, dyspnea on exertion, asthmatic bronchitis, diastolic heart failure, hypertension, hypercholesterolemia, gerd, rhinitis, anemia, BPH, lumbago. Palliative Care was asked to help address goals of care.   CODE STATUS: DNR  PPS: 60% HOSPICE ELIGIBILITY/DIAGNOSIS: TBD  PHYSICAL EXAM:   Deferred  PAST MEDICAL HISTORY:  Past Medical History:  Diagnosis Date  . Asthma   . BPH (benign prostatic hyperplasia)   .  Bronchitis   . Cancer ([facility])   . COPD (chronic obstructive pulmonary disease) ([facility])   . DVT of leg (deep venous thrombosis) ([facility])   . Elevated PSA   . Emphysema lung ([facility])   . Emphysema of lung ([facility])   . Hyperlipidemia   . Nocturia   . Pulmonary fibrosis ([facility])   . Pulmonary fibrosis ([facility])   . Skin cancer     SOCIAL HX:  Social History   Tobacco Use  . Smoking status: Former Research scientist (life sciences)  . Smokeless tobacco: Never Used  . Tobacco comment: quit 40 years  Substance Use Topics  . Alcohol use: No    Alcohol/week: 0.0 standard drinks    ALLERGIES:  Allergies  Allergen Reactions  . Amoxicillin Other (See Comments)    DID THE REACTION INVOLVE: Swelling of the face/tongue/throat, SOB, or low BP? Unknown Sudden or severe rash/hives, skin peeling, or the inside of the mouth or nose? Unknown Did it require medical treatment? Unknown When did it last happen?Unknown If all above answers are "NO", may proceed with cephalosporin use.   . Codeine Nausea And Vomiting  . Rapaflo [Silodosin] Other (See Comments)    Reaction:  Unknown   . Tramadol Nausea And Vomiting and Other (See Comments)    Pt states that this medication makes him feel crazy.    . Trimethoprim Other (See Comments)    Reaction: unknown     PERTINENT MEDICATIONS:  Outpatient Encounter Medications as of 11/11/2019  Medication Sig  . albuterol (PROVENTIL HFA;VENTOLIN HFA) 108 (90 Base) MCG/ACT inhaler Inhale 2  puffs into the lungs every 6 (six) hours as needed for wheezing or shortness of breath.  Marland Kitchen amiodarone (PACERONE) 200 MG tablet Take 1 tablet (200 mg total) by mouth 2 (two) times daily. (Patient taking differently: Take 200 mg by mouth daily. )  . apixaban (ELIQUIS) 2.5 MG TABS tablet Take 1 tablet (2.5 mg total) by mouth 2 (two) times daily.  Marland Kitchen aspirin EC 81 MG tablet Take 81 mg by mouth daily.   Marland Kitchen atorvastatin (LIPITOR) 20 MG tablet Take 1 tablet (20 mg total) by mouth daily at 6 PM.  . azelastine (ASTELIN) 0.1 %  nasal spray Place 1 spray into both nostrils daily.  . budesonide (PULMICORT) 0.5 MG/2ML nebulizer solution Take 0.5 mg by nebulization daily.  Marland Kitchen dextromethorphan-guaiFENesin (MUCINEX DM) 30-600 MG 12hr tablet Take 1 tablet by mouth 2 (two) times daily.  . feeding supplement, ENSURE ENLIVE, (ENSURE ENLIVE) LIQD Take 237 mLs by mouth 2 (two) times daily between meals.  . finasteride (PROSCAR) 5 MG tablet Take 1 tablet (5 mg total) by mouth daily.  Marland Kitchen gabapentin (NEURONTIN) 100 MG capsule Take 100-200 mg by mouth 2 (two) times daily. Take 2capsules (200mg ) by mouth every night and 1 capsule (100mg ) by mouth every morning.  Marland Kitchen guaiFENesin (MUCINEX) 600 MG 12 hr tablet Take 1 tablet (600 mg total) by mouth 2 (two) times daily.  Marland Kitchen ipratropium-albuterol (DUONEB) 0.5-2.5 (3) MG/3ML SOLN Inhale 3 mLs into the lungs 4 (four) times daily.  Marland Kitchen LORazepam (ATIVAN) 0.5 MG tablet Take 1 tablet (0.5 mg total) by mouth daily as needed for anxiety.  . midodrine (PROAMATINE) 10 MG tablet Take 1 tablet (10 mg total) by mouth 3 (three) times daily with meals.  . Multiple Vitamin (MULTIVITAMIN WITH MINERALS) TABS tablet Take 1 tablet by mouth daily.  . Multiple Vitamins-Minerals (OCUVITE ADULT 50+ PO) Take 1 capsule by mouth daily.  . naphazoline-glycerin (CLEAR EYES REDNESS) 0.012-0.2 % SOLN Place 1-2 drops into both eyes 4 (four) times daily as needed for eye irritation.  . pantoprazole (PROTONIX) 40 MG tablet Take 1 tablet (40 mg total) by mouth 2 (two) times daily.  . tamsulosin (FLOMAX) 0.4 MG CAPS capsule Take 2 capsules (0.8 mg total) by mouth daily.   No facility-administered encounter medications on file as of 11/11/2019.       Tanay Misuraca Jenetta Downer, NP

## 2019-12-04 ENCOUNTER — Ambulatory Visit: Payer: Medicare Other | Admitting: Urology

## 2019-12-18 NOTE — Progress Notes (Deleted)
9:47 PM   Wesley Newton Dec 14, 1927 [id number]  Referring provider: Baxter Hire, MD [place],  [place] [address]  No chief complaint on file.   HPI: Patient is an 85 year old male with a history of nocturia and BPH with LUTS who presents today for a one year follow up.    Nocturia Patient is experiencing nocturia up to 5 times nightly. He finds it very bothersome and interrupting to his sleep. He is suffering from pulmonary fibrosis. He has undergone a sleep study and was not found to have sleep apnea.  Myrbetriq was not effective.    BPH WITH LUTS His IPSS score today is ***, which is *** lower urinary tract symptomatology.   He is *** with his quality life due to his urinary symptoms.   His previous I PSS score was 14/2.  His complaints today are frequency, nocturia, intermittency and a weak stream.  He denies any dysuria, hematuria or suprapubic pain.   He currently taking finasteride and tamsulosin 0.8 mg daily.  His has had prostate biopsies in the remote past and no cancer was identified.  His last PSA was 1.4 ng/mL on 12/2014.  He also denies any recent fevers, chills, nausea or vomiting.  He does not have a family history of PCa.   Score:  1-7 Mild 8-19 Moderate 20-35 Severe     PMH: Past Medical History:  Diagnosis Date  . Asthma   . BPH (benign prostatic hyperplasia)   . Bronchitis   . Cancer ([facility])   . COPD (chronic obstructive pulmonary disease) ([facility])   . DVT of leg (deep venous thrombosis) ([facility])   . Elevated PSA   . Emphysema lung ([facility])   . Emphysema of lung ([facility])   . Hyperlipidemia   . Nocturia   . Pulmonary fibrosis ([facility])   . Pulmonary fibrosis ([facility])   . Skin cancer     Surgical History: Past Surgical History:  Procedure Laterality Date  . CATARACT EXTRACTION    . HERNIA REPAIR    . TONSILLECTOMY      Home Medications:  Allergies as of 12/19/2019      Reactions   Amoxicillin Other (See Comments)   DID THE  REACTION INVOLVE: Swelling of the face/tongue/throat, SOB, or low BP? Unknown Sudden or severe rash/hives, skin peeling, or the inside of the mouth or nose? Unknown Did it require medical treatment? Unknown When did it last happen?Unknown If all above answers are "NO", may proceed with cephalosporin use.   Codeine Nausea And Vomiting   Rapaflo [silodosin] Other (See Comments)   Reaction:  Unknown    Tramadol Nausea And Vomiting, Other (See Comments)   Pt states that this medication makes him feel crazy.     Trimethoprim Other (See Comments)   Reaction: unknown      Medication List       Accurate as of December 18, 2019  9:47 PM. If you have any questions, ask your nurse or doctor.        albuterol 108 (90 Base) MCG/ACT inhaler Commonly known as: VENTOLIN HFA Inhale 2 puffs into the lungs every 6 (six) hours as needed for wheezing or shortness of breath.   amiodarone 200 MG tablet Commonly known as: PACERONE Take 1 tablet (200 mg total) by mouth 2 (two) times daily. What changed: when to take this   apixaban 2.5 MG Tabs tablet Commonly known as: ELIQUIS Take 1 tablet (2.5 mg total) by  mouth 2 (two) times daily.   aspirin EC 81 MG tablet Take 81 mg by mouth daily.   atorvastatin 20 MG tablet Commonly known as: LIPITOR Take 1 tablet (20 mg total) by mouth daily at 6 PM.   azelastine 0.1 % nasal spray Commonly known as: ASTELIN Place 1 spray into both nostrils daily.   budesonide 0.5 MG/2ML nebulizer solution Commonly known as: PULMICORT Take 0.5 mg by nebulization daily.   dextromethorphan-guaiFENesin 30-600 MG 12hr tablet Commonly known as: MUCINEX DM Take 1 tablet by mouth 2 (two) times daily.   feeding supplement (ENSURE ENLIVE) Liqd Take 237 mLs by mouth 2 (two) times daily between meals.   finasteride 5 MG tablet Commonly known as: PROSCAR Take 1 tablet (5 mg total) by mouth daily.   gabapentin 100 MG capsule Commonly known as: NEURONTIN Take 100-200  mg by mouth 2 (two) times daily. Take 2capsules (200mg ) by mouth every night and 1 capsule (100mg ) by mouth every morning.   guaiFENesin 600 MG 12 hr tablet Commonly known as: MUCINEX Take 1 tablet (600 mg total) by mouth 2 (two) times daily.   ipratropium-albuterol 0.5-2.5 (3) MG/3ML Soln Commonly known as: DUONEB Inhale 3 mLs into the lungs 4 (four) times daily.   LORazepam 0.5 MG tablet Commonly known as: ATIVAN Take 1 tablet (0.5 mg total) by mouth daily as needed for anxiety.   midodrine 10 MG tablet Commonly known as: PROAMATINE Take 1 tablet (10 mg total) by mouth 3 (three) times daily with meals.   multivitamin with minerals Tabs tablet Take 1 tablet by mouth daily.   naphazoline-glycerin 0.012-0.2 % Soln Commonly known as: CLEAR EYES REDNESS Place 1-2 drops into both eyes 4 (four) times daily as needed for eye irritation.   OCUVITE ADULT 50+ PO Take 1 capsule by mouth daily.   pantoprazole 40 MG tablet Commonly known as: PROTONIX Take 1 tablet (40 mg total) by mouth 2 (two) times daily.   tamsulosin 0.4 MG Caps capsule Commonly known as: FLOMAX Take 2 capsules (0.8 mg total) by mouth daily.       Allergies:  Allergies  Allergen Reactions  . Amoxicillin Other (See Comments)    DID THE REACTION INVOLVE: Swelling of the face/tongue/throat, SOB, or low BP? Unknown Sudden or severe rash/hives, skin peeling, or the inside of the mouth or nose? Unknown Did it require medical treatment? Unknown When did it last happen?Unknown If all above answers are "NO", may proceed with cephalosporin use.   . Codeine Nausea And Vomiting  . Rapaflo [Silodosin] Other (See Comments)    Reaction:  Unknown   . Tramadol Nausea And Vomiting and Other (See Comments)    Pt states that this medication makes him feel crazy.    . Trimethoprim Other (See Comments)    Reaction: unknown    Family History: Family History  Problem Relation Age of Onset  . Heart Problems Brother   .  Kidney disease Neg Hx   . Prostate cancer Neg Hx     Social History:  reports that he has quit smoking. He has never used smokeless tobacco. He reports that he does not drink alcohol or use drugs.  ROS:                                        Physical Exam: There were no vitals taken for this visit.  Constitutional:  Well nourished.  Alert and oriented, No acute distress. HEENT: [place] AT, moist mucus membranes.  Trachea midline, no masses. Cardiovascular: No clubbing, cyanosis, or edema. Respiratory: Normal respiratory effort, no increased work of breathing. GI: Abdomen is soft, non tender, non distended, no abdominal masses. Liver and spleen not palpable.  No hernias appreciated.  Stool sample for occult testing is not indicated.   GU: No CVA tenderness.  No bladder fullness or masses.  Patient with circumcised/uncircumcised phallus. ***Foreskin easily retracted***  Urethral meatus is patent.  No penile discharge. No penile lesions or rashes. Scrotum without lesions, cysts, rashes and/or edema.  Testicles are located scrotally bilaterally. No masses are appreciated in the testicles. Left and right epididymis are normal. Rectal: Patient with  normal sphincter tone. Anus and perineum without scarring or rashes. No rectal masses are appreciated. Prostate is approximately *** grams, *** nodules are appreciated. Seminal vesicles are normal. Skin: No rashes, bruises or suspicious lesions. Lymph: No cervical or inguinal adenopathy. Neurologic: Grossly intact, no focal deficits, moving all 4 extremities. Psychiatric: Normal mood and affect.  Laboratory Data: Lab Results  Component Value Date   WBC 12.4 (H) 05/04/2019   HGB 13.7 05/04/2019   HCT 41.4 05/04/2019   MCV 83.0 05/04/2019   PLT 328 05/04/2019    Lab Results  Component Value Date   CREATININE 1.06 05/04/2019    Lab Results  Component Value Date   AST 26 05/04/2019   Lab Results  Component Value Date    ALT 22 05/04/2019   I have reviewed the labs.  Assessment & Plan:    1. Nocturia  - patient does not have sleep apnea  - not a candidate for desmopressin due to diminished GFR  - manage conservatively  2.  BPH with LUTS  - IPSS score is 14/2, it is worsening  - Continue conservative management, avoiding bladder irritants and timed voiding's  - Continue tamsulosin 0.4 mg daily and finasteride 5 mg daily; refills given  - RTC in one year for I PSS    No follow-ups on file.  These notes generated with voice recognition software. I apologize for typographical errors.  Zara Council, PA-C  [facility] [address] [place], [place] [address] [phone]

## 2019-12-19 ENCOUNTER — Ambulatory Visit: Payer: Medicare Other | Admitting: Urology

## 2019-12-23 NOTE — Progress Notes (Signed)
1:34 PM   Wesley Newton 04-Nov-1927 [id number]  Referring provider: Baxter Hire, MD [place],  [place] [id number]  Chief Complaint  Patient presents with  . Benign Prostatic Hypertrophy    HPI: Patient is an 86 year old male with a history of nocturia and BPH with LUTS who presents today for a one year follow up.    Nocturia Patient is experiencing nocturia up to 5 times nightly. He finds it very bothersome and interrupting to his sleep. He is suffering from pulmonary fibrosis. He has undergone a sleep study and was not found to have sleep apnea.  Myrbetriq was not effective.   BPH WITH LUTS His IPSS score today is 11, which is moderate lower urinary tract symptomatology.   He is pleased with his quality life due to his urinary symptoms.   His previous I PSS score was 14/2.  His complaints today are nocturia, leakage of urine and intermittency.  He denies any dysuria, hematuria or suprapubic pain.   He currently taking finasteride and tamsulosin 0.8 mg daily.  His has had prostate biopsies in the remote past and no cancer was identified.  His last PSA was 1.4 ng/mL on 12/2014.  He also denies any recent fevers, chills, nausea or vomiting.  He does not have a family history of PCa.  IPSS    Row Name 12/24/19 1300         International Prostate Symptom Score   How often have you had the sensation of not emptying your bladder?  Less than 1 in 5     How often have you had to urinate less than every two hours?  Less than 1 in 5 times     How often have you found you stopped and started again several times when you urinated?  Less than half the time     How often have you found it difficult to postpone urination?  Less than 1 in 5 times     How often have you had a weak urinary stream?  About half the time     How often have you had to strain to start urination?  Less than 1 in 5 times     How many times did you typically get up at night to urinate?  2 Times      Total IPSS Score  11       Quality of Life due to urinary symptoms   If you were to spend the rest of your life with your urinary condition just the way it is now how would you feel about that?  Mostly Satisfied        Score:  1-7 Mild 8-19 Moderate 20-35 Severe     PMH: Past Medical History:  Diagnosis Date  . Asthma   . BPH (benign prostatic hyperplasia)   . Bronchitis   . Cancer ([place])   . COPD (chronic obstructive pulmonary disease) ([place])   . DVT of leg (deep venous thrombosis) ([place])   . Elevated PSA   . Emphysema lung ([place])   . Emphysema of lung ([place])   . Hyperlipidemia   . Nocturia   . Pulmonary fibrosis ([place])   . Pulmonary fibrosis ([place])   . Skin cancer     Surgical History: Past Surgical History:  Procedure Laterality Date  . CATARACT EXTRACTION    . HERNIA REPAIR    . TONSILLECTOMY      Home Medications:  Allergies as  of 12/24/2019      Reactions   Amoxicillin Other (See Comments)   DID THE REACTION INVOLVE: Swelling of the face/tongue/throat, SOB, or low BP? Unknown Sudden or severe rash/hives, skin peeling, or the inside of the mouth or nose? Unknown Did it require medical treatment? Unknown When did it last happen?Unknown If all above answers are "NO", may proceed with cephalosporin use.   Codeine Nausea And Vomiting   Rapaflo [silodosin] Other (See Comments)   Reaction:  Unknown    Tramadol Nausea And Vomiting, Other (See Comments)   Pt states that this medication makes him feel crazy.     Trimethoprim Other (See Comments)   Reaction: unknown      Medication List       Accurate as of December 24, 2019  1:34 PM. If you have any questions, ask your nurse or doctor.        STOP taking these medications   aspirin EC 81 MG tablet Stopped by: Fabiana Dromgoole, PA-C   multivitamin with minerals Tabs tablet Stopped by: Ineze Serrao, PA-C     TAKE these medications   albuterol 108 (90 Base) MCG/ACT inhaler Commonly known as: VENTOLIN  HFA Inhale 2 puffs into the lungs every 6 (six) hours as needed for wheezing or shortness of breath.   amiodarone 200 MG tablet Commonly known as: PACERONE Take 1 tablet (200 mg total) by mouth 2 (two) times daily. What changed: when to take this   apixaban 2.5 MG Tabs tablet Commonly known as: ELIQUIS Take 1 tablet (2.5 mg total) by mouth 2 (two) times daily.   atorvastatin 20 MG tablet Commonly known as: LIPITOR Take 1 tablet (20 mg total) by mouth daily at 6 PM.   azelastine 0.1 % nasal spray Commonly known as: ASTELIN Place 1 spray into both nostrils daily.   budesonide 0.5 MG/2ML nebulizer solution Commonly known as: PULMICORT Take 0.5 mg by nebulization daily.   dextromethorphan-guaiFENesin 30-600 MG 12hr tablet Commonly known as: MUCINEX DM Take 1 tablet by mouth 2 (two) times daily.   feeding supplement (ENSURE ENLIVE) Liqd Take 237 mLs by mouth 2 (two) times daily between meals.   finasteride 5 MG tablet Commonly known as: PROSCAR Take 1 tablet (5 mg total) by mouth daily.   gabapentin 100 MG capsule Commonly known as: NEURONTIN Take 100-200 mg by mouth 2 (two) times daily. Take 2capsules (200mg ) by mouth every night and 1 capsule (100mg ) by mouth every morning.   guaiFENesin 600 MG 12 hr tablet Commonly known as: MUCINEX Take 1 tablet (600 mg total) by mouth 2 (two) times daily.   ipratropium-albuterol 0.5-2.5 (3) MG/3ML Soln Commonly known as: DUONEB Inhale 3 mLs into the lungs 4 (four) times daily.   LORazepam 0.5 MG tablet Commonly known as: ATIVAN Take 1 tablet (0.5 mg total) by mouth daily as needed for anxiety.   midodrine 10 MG tablet Commonly known as: PROAMATINE Take 1 tablet (10 mg total) by mouth 3 (three) times daily with meals.   naphazoline-glycerin 0.012-0.2 % Soln Commonly known as: CLEAR EYES REDNESS Place 1-2 drops into both eyes 4 (four) times daily as needed for eye irritation.   OCUVITE ADULT 50+ PO Take 1 capsule by mouth  daily.   pantoprazole 40 MG tablet Commonly known as: PROTONIX Take 1 tablet (40 mg total) by mouth 2 (two) times daily.   tamsulosin 0.4 MG Caps capsule Commonly known as: FLOMAX Take 2 capsules (0.8 mg total) by mouth daily.  Allergies:  Allergies  Allergen Reactions  . Amoxicillin Other (See Comments)    DID THE REACTION INVOLVE: Swelling of the face/tongue/throat, SOB, or low BP? Unknown Sudden or severe rash/hives, skin peeling, or the inside of the mouth or nose? Unknown Did it require medical treatment? Unknown When did it last happen?Unknown If all above answers are "NO", may proceed with cephalosporin use.   . Codeine Nausea And Vomiting  . Rapaflo [Silodosin] Other (See Comments)    Reaction:  Unknown   . Tramadol Nausea And Vomiting and Other (See Comments)    Pt states that this medication makes him feel crazy.    . Trimethoprim Other (See Comments)    Reaction: unknown    Family History: Family History  Problem Relation Age of Onset  . Heart Problems Brother   . Kidney disease Neg Hx   . Prostate cancer Neg Hx     Social History:  reports that he has quit smoking. He has never used smokeless tobacco. He reports that he does not drink alcohol or use drugs.  ROS: UROLOGY Frequent Urination?: No Hard to postpone urination?: No Burning/pain with urination?: No Get up at night to urinate?: Yes Leakage of urine?: Yes Urine stream starts and stops?: Yes Trouble starting stream?: No Do you have to strain to urinate?: No Blood in urine?: No Urinary tract infection?: No Sexually transmitted disease?: No Injury to kidneys or bladder?: No Painful intercourse?: No Weak stream?: No Erection problems?: No Penile pain?: No  Gastrointestinal Nausea?: No Vomiting?: No Indigestion/heartburn?: Yes Diarrhea?: No Constipation?: Yes  Constitutional Fever: No Night sweats?: No Weight loss?: Yes Fatigue?: Yes  Skin Skin rash/lesions?: No Itching?:  Yes  Eyes Blurred vision?: No Double vision?: No  Ears/Nose/Throat Sore throat?: No Sinus problems?: Yes  Hematologic/Lymphatic Swollen glands?: No Easy bruising?: No  Cardiovascular Leg swelling?: Yes Chest pain?: No  Respiratory Cough?: Yes Shortness of breath?: Yes  Endocrine Excessive thirst?: No  Musculoskeletal Back pain?: Yes Joint pain?: Yes  Neurological Headaches?: No Dizziness?: No  Psychologic Depression?: No Anxiety?: Yes  Physical Exam: BP (!) 113/55   Pulse 78   Ht 5\' 6"  (1.676 m)   Wt 152 lb (68.9 kg)   BMI 24.53 kg/m   Constitutional:  Well nourished. Alert and oriented, No acute distress. HEENT: [place] AT, mask in place.  Trachea midline, no masses. Cardiovascular: No clubbing, cyanosis, or edema. Respiratory: Normal respiratory effort, no increased work of breathing. Neurologic: Grossly intact, no focal deficits, moving all 4 extremities. Psychiatric: Normal mood and affect.  Laboratory Data: Lab Results  Component Value Date   WBC 12.4 (H) 05/04/2019   HGB 13.7 05/04/2019   HCT 41.4 05/04/2019   MCV 83.0 05/04/2019   PLT 328 05/04/2019    Lab Results  Component Value Date   CREATININE 1.06 05/04/2019    Lab Results  Component Value Date   AST 26 05/04/2019   Lab Results  Component Value Date   ALT 22 05/04/2019   I have reviewed the labs.  Assessment & Plan:    1. Nocturia Patient does not have sleep apnea Not a candidate for desmopressin due to diminished GFR Manage conservatively  2.  BPH with LUTS IPSS score is 11/2, it is improving  Continue conservative management, avoiding bladder irritants and timed voiding's Continue tamsulosin 0.4 mg x 2 daily and finasteride 5 mg daily; refills given RTC in one year for I PSS    Return in about 1 year (around 12/23/2020) for  I PSS and PVR .  These notes generated with voice recognition software. I apologize for typographical errors.  Zara Council, PA-C   [facility] [address] [place], [place] [address] [phone]

## 2019-12-24 ENCOUNTER — Encounter: Payer: Self-pay | Admitting: Urology

## 2019-12-24 ENCOUNTER — Ambulatory Visit: Payer: Medicare Other | Admitting: Urology

## 2019-12-24 ENCOUNTER — Other Ambulatory Visit: Payer: Self-pay

## 2019-12-24 VITALS — BP 113/55 | HR 78 | Ht 66.0 in | Wt 152.0 lb

## 2019-12-24 DIAGNOSIS — R351 Nocturia: Secondary | ICD-10-CM

## 2019-12-24 DIAGNOSIS — N401 Enlarged prostate with lower urinary tract symptoms: Secondary | ICD-10-CM

## 2019-12-24 DIAGNOSIS — N138 Other obstructive and reflux uropathy: Secondary | ICD-10-CM

## 2019-12-24 LAB — BLADDER SCAN AMB NON-IMAGING: Scan Result: 42

## 2019-12-24 MED ORDER — FINASTERIDE 5 MG PO TABS: 5.0000 mg | ORAL_TABLET | Freq: Every day | ORAL | 3 refills | Status: AC

## 2019-12-24 MED ORDER — TAMSULOSIN HCL 0.4 MG PO CAPS: 0.8000 mg | ORAL_CAPSULE | Freq: Every day | ORAL | 3 refills | Status: DC

## 2019-12-29 ENCOUNTER — Encounter: Payer: Self-pay | Admitting: Emergency Medicine

## 2019-12-29 ENCOUNTER — Emergency Department: Payer: Medicare Other

## 2019-12-29 ENCOUNTER — Emergency Department
Admission: EM | Admit: 2019-12-29 | Discharge: 2019-12-29 | Disposition: A | Discharge status: Home / Self Care | Payer: Medicare Other | Attending: Emergency Medicine | Admitting: Emergency Medicine

## 2019-12-29 ENCOUNTER — Other Ambulatory Visit: Payer: Self-pay

## 2019-12-29 DIAGNOSIS — Z87891 Personal history of nicotine dependence: Secondary | ICD-10-CM | POA: Diagnosis not present

## 2019-12-29 DIAGNOSIS — J8410 Pulmonary fibrosis, unspecified: Secondary | ICD-10-CM | POA: Insufficient documentation

## 2019-12-29 DIAGNOSIS — J449 Chronic obstructive pulmonary disease, unspecified: Secondary | ICD-10-CM | POA: Insufficient documentation

## 2019-12-29 DIAGNOSIS — Z7901 Long term (current) use of anticoagulants: Secondary | ICD-10-CM | POA: Diagnosis not present

## 2019-12-29 DIAGNOSIS — Z20822 Contact with and (suspected) exposure to covid-19: Secondary | ICD-10-CM | POA: Diagnosis not present

## 2019-12-29 DIAGNOSIS — I10 Essential (primary) hypertension: Secondary | ICD-10-CM | POA: Insufficient documentation

## 2019-12-29 DIAGNOSIS — J45909 Unspecified asthma, uncomplicated: Secondary | ICD-10-CM | POA: Insufficient documentation

## 2019-12-29 DIAGNOSIS — R05 Cough: Secondary | ICD-10-CM | POA: Diagnosis present

## 2019-12-29 LAB — BASIC METABOLIC PANEL
Anion gap: 10 (ref 5–15)
BUN: 25 mg/dL — ABNORMAL HIGH (ref 8–23)
CO2: 24 mmol/L (ref 22–32)
Calcium: 9 mg/dL (ref 8.9–10.3)
Chloride: 105 mmol/L (ref 98–111)
Creatinine, Ser: 1.61 mg/dL — ABNORMAL HIGH (ref 0.61–1.24)
GFR calc Af Amer: 43 mL/min — ABNORMAL LOW (ref >60)
GFR calc non Af Amer: 37 mL/min — ABNORMAL LOW (ref >60)
Glucose, Bld: 106 mg/dL — ABNORMAL HIGH (ref 70–99)
Potassium: 4.5 mmol/L (ref 3.5–5.1)
Sodium: 139 mmol/L (ref 135–145)

## 2019-12-29 LAB — CBC
HCT: 37.1 % — ABNORMAL LOW (ref 39.0–52.0)
Hemoglobin: 12 g/dL — ABNORMAL LOW (ref 13.0–17.0)
MCH: 28.2 pg (ref 26.0–34.0)
MCHC: 32.3 g/dL (ref 30.0–36.0)
MCV: 87.1 fL (ref 80.0–100.0)
Platelets: 267 10*3/uL (ref 150–400)
RBC: 4.26 MIL/uL (ref 4.22–5.81)
RDW: 15.9 % — ABNORMAL HIGH (ref 11.5–15.5)
WBC: 8.8 10*3/uL (ref 4.0–10.5)
nRBC: 0 % (ref 0.0–0.2)

## 2019-12-29 LAB — TROPONIN I (HIGH SENSITIVITY): Troponin I (High Sensitivity): 5 ng/L (ref <18)

## 2019-12-29 MED ORDER — PREDNISONE 20 MG PO TABS
50.0000 mg | ORAL_TABLET | Freq: Once | ORAL | Status: AC
  Administered 2019-12-29: 50 mg via ORAL
  Filled 2019-12-29: qty 2

## 2019-12-29 MED ORDER — DOXYCYCLINE HYCLATE 100 MG PO TABS: 100.0000 mg | ORAL_TABLET | Freq: Two times a day (BID) | ORAL | 0 refills | Status: DC

## 2019-12-29 MED ORDER — IPRATROPIUM-ALBUTEROL 0.5-2.5 (3) MG/3ML IN SOLN: 3.0000 mL | Freq: Once | RESPIRATORY_TRACT | Status: AC

## 2019-12-29 MED ORDER — IPRATROPIUM-ALBUTEROL 0.5-2.5 (3) MG/3ML IN SOLN
RESPIRATORY_TRACT | Status: AC
  Administered 2019-12-29: 18:00:00 3 mL via RESPIRATORY_TRACT
  Filled 2019-12-29: qty 6

## 2019-12-29 MED ORDER — DOXYCYCLINE HYCLATE 100 MG PO TABS
100.0000 mg | ORAL_TABLET | Freq: Once | ORAL | Status: AC
  Administered 2019-12-29: 20:00:00 100 mg via ORAL
  Filled 2019-12-29: qty 1

## 2019-12-29 MED ORDER — PREDNISONE 50 MG PO TABS: 50.0000 mg | ORAL_TABLET | Freq: Every day | ORAL | 0 refills | Status: DC

## 2019-12-29 MED ORDER — IPRATROPIUM-ALBUTEROL 0.5-2.5 (3) MG/3ML IN SOLN
3.0000 mL | Freq: Once | RESPIRATORY_TRACT | Status: AC
  Administered 2019-12-29: 3 mL via RESPIRATORY_TRACT

## 2019-12-29 NOTE — ED Notes (Signed)
NAD . remains to wait for room

## 2019-12-29 NOTE — ED Triage Notes (Addendum)
Pt arrived via [facility] from [facility] with reports of cough and shortness of breath. Pt states every morning he coughs up mucus and phlegm but states today he was unable to and states seems like cough has been worse.  Pt also reports feeling weak.

## 2019-12-29 NOTE — ED Triage Notes (Signed)
FIRST NURSE NOTE- from [facility] for cough by EMS.  sats 97 % RA.  Unlabored. Hx emphysema

## 2019-12-29 NOTE — ED Provider Notes (Signed)
[facility] Emergency Department Provider Note   ____________________________________________    I have reviewed the triage vital signs and the nursing notes.   HISTORY  Chief Complaint Cough and Shortness of Breath     HPI Wesley Newton is a 86 y.o. male with a history of pulmonary fibrosis, COPD who follows with pulmonology who presents with complaints of cough.  Patient reports this morning he felt a productive cough and increased shortness of breath.  Denies fevers or chills.  No chest pain.  Did use his inhaler which helped somewhat.  Currently reports he is feeling mostly better but is still feels tightness in his chest.  No loss of taste or smell   Past Medical History:  Diagnosis Date  . Asthma   . BPH (benign prostatic hyperplasia)   . Bronchitis   . Cancer ([place])   . COPD (chronic obstructive pulmonary disease) ([place])   . DVT of leg (deep venous thrombosis) ([place])   . Elevated PSA   . Emphysema lung ([place])   . Emphysema of lung ([place])   . Hyperlipidemia   . Nocturia   . Pulmonary fibrosis ([place])   . Pulmonary fibrosis ([place])   . Skin cancer     Patient Active Problem List   Diagnosis Date Noted  . Atrial fibrillation with RVR ([place]) 05/04/2019  . Idiopathic pulmonary fibrosis ([place]) 03/05/2019  . Malnutrition of moderate degree 12/21/2018  . Influenza B 12/19/2018  . CAP (community acquired pneumonia) 12/19/2018  . Lumbar disc herniation 10/10/2017  . Degenerative disc disease 08/01/2017  . PAD (peripheral artery disease) ([place]) 04/25/2017  . BPH (benign prostatic hyperplasia) 01/10/2016  . Nocturia 01/10/2016  . Benign prostatic hyperplasia with urinary obstruction 01/10/2016  . AB (asthmatic bronchitis) 12/17/2014  . Carotid artery disease ([place]) 12/04/2014  . GERD (gastroesophageal reflux disease) 12/04/2014  . H/O respiratory system disease 12/04/2014  . H/O peptic ulcer 12/04/2014  . Hypercholesterolemia 12/04/2014  .  Unstable angina pectoris ([place]) 12/04/2014  . Amnesia 12/04/2014  . Chronic obstructive pulmonary disease ([place]) 06/11/2014  . Cephalalgia 06/11/2014  . HTN (hypertension) 06/11/2014    Past Surgical History:  Procedure Laterality Date  . CATARACT EXTRACTION    . HERNIA REPAIR    . TONSILLECTOMY      Prior to Admission medications   Medication Sig Start Date End Date Taking? Authorizing Provider  albuterol (PROVENTIL HFA;VENTOLIN HFA) 108 (90 Base) MCG/ACT inhaler Inhale 2 puffs into the lungs every 6 (six) hours as needed for wheezing or shortness of breath.    [provider]  amiodarone (PACERONE) 200 MG tablet Take 1 tablet (200 mg total) by mouth 2 (two) times daily. Patient taking differently: Take 200 mg by mouth daily.  05/07/19   Dustin Flock, MD  apixaban (ELIQUIS) 2.5 MG TABS tablet Take 1 tablet (2.5 mg total) by mouth 2 (two) times daily. 05/07/19   Dustin Flock, MD  atorvastatin (LIPITOR) 20 MG tablet Take 1 tablet (20 mg total) by mouth daily at 6 PM. 05/07/19   Dustin Flock, MD  azelastine (ASTELIN) 0.1 % nasal spray Place 1 spray into both nostrils daily. 01/10/19   [provider]  budesonide (PULMICORT) 0.5 MG/2ML nebulizer solution Take 0.5 mg by nebulization daily.    [provider]  dextromethorphan-guaiFENesin (MUCINEX DM) 30-600 MG 12hr tablet Take 1 tablet by mouth 2 (two) times daily.    [provider]  doxycycline (VIBRA-TABS) 100 MG tablet Take 1 tablet (100 mg  total) by mouth 2 (two) times daily. 12/29/19   Lavonia Drafts, MD  feeding supplement, ENSURE ENLIVE, (ENSURE ENLIVE) LIQD Take 237 mLs by mouth 2 (two) times daily between meals. 12/21/18   Henreitta Leber, MD  finasteride (PROSCAR) 5 MG tablet Take 1 tablet (5 mg total) by mouth daily. 12/24/19   Zara Council A, PA-C  gabapentin (NEURONTIN) 100 MG capsule Take 100-200 mg by mouth 2 (two) times daily. Take 2capsules (200mg ) by mouth every night and 1 capsule  (100mg ) by mouth every morning. 02/04/19   [provider]  guaiFENesin (MUCINEX) 600 MG 12 hr tablet Take 1 tablet (600 mg total) by mouth 2 (two) times daily. 05/07/19   Dustin Flock, MD  ipratropium-albuterol (DUONEB) 0.5-2.5 (3) MG/3ML SOLN Inhale 3 mLs into the lungs 4 (four) times daily. 01/14/19   [provider]  LORazepam (ATIVAN) 0.5 MG tablet Take 1 tablet (0.5 mg total) by mouth daily as needed for anxiety. 05/07/19   Dustin Flock, MD  midodrine (PROAMATINE) 10 MG tablet Take 1 tablet (10 mg total) by mouth 3 (three) times daily with meals. 05/07/19   Dustin Flock, MD  Multiple Vitamins-Minerals (OCUVITE ADULT 50+ PO) Take 1 capsule by mouth daily.    [provider]  naphazoline-glycerin (CLEAR EYES REDNESS) 0.012-0.2 % SOLN Place 1-2 drops into both eyes 4 (four) times daily as needed for eye irritation. 12/21/18   Henreitta Leber, MD  pantoprazole (PROTONIX) 40 MG tablet Take 1 tablet (40 mg total) by mouth 2 (two) times daily. 05/07/19   Dustin Flock, MD  predniSONE (DELTASONE) 50 MG tablet Take 1 tablet (50 mg total) by mouth daily with breakfast. 12/29/19   Lavonia Drafts, MD  tamsulosin (FLOMAX) 0.4 MG CAPS capsule Take 2 capsules (0.8 mg total) by mouth daily. 12/24/19   Zara Council A, PA-C     Allergies Amoxicillin, Codeine, Rapaflo [silodosin], Tramadol, and Trimethoprim  Family History  Problem Relation Age of Onset  . Heart Problems Brother   . Kidney disease Neg Hx   . Prostate cancer Neg Hx     Social History Social History   Tobacco Use  . Smoking status: Former Research scientist (life sciences)  . Smokeless tobacco: Never Used  . Tobacco comment: quit 40 years  Substance Use Topics  . Alcohol use: No    Alcohol/week: 0.0 standard drinks  . Drug use: No    Review of Systems  Constitutional: As above Eyes: No visual changes.  ENT: No sore throat. Cardiovascular: Denies chest pain. Respiratory: As above Gastrointestinal: No abdominal pain.   Genitourinary: Negative for dysuria. Musculoskeletal: Negative for back pain. Skin: Negative for rash. Neurological: Negative for headaches   ____________________________________________   PHYSICAL EXAM:  VITAL SIGNS: ED Triage Vitals  Enc Vitals Group     BP 12/29/19 1332 (!) 137/54     Pulse Rate 12/29/19 1332 73     Resp 12/29/19 1332 20     Temp 12/29/19 1332 97.8 F (36.6 C)     Temp Source 12/29/19 1332 Oral     SpO2 12/29/19 1435 96 %     Weight 12/29/19 1332 68.9 kg (152 lb)     Height 12/29/19 1332 1.676 m (5\' 6" )     Head Circumference --      Peak Flow --      Pain Score 12/29/19 1336 0     Pain Loc --      Pain Edu? --      Excl. in [place]? --  Constitutional: Alert and oriented.   Nose: No congestion/rhinnorhea. Mouth/Throat: Mucous membranes are moist.    Cardiovascular: Normal rate, regular rhythm Good peripheral circulation. Respiratory: Normal respiratory effort.  No retractions.  Scattered rales Gastrointestinal: Soft and nontender. No distention.  Musculoskeletal:   Warm and well perfused Neurologic:  Normal speech and language. No gross focal neurologic deficits are appreciated.  Skin:  Skin is warm, dry and intact. No rash noted. Psychiatric: Mood and affect are normal. Speech and behavior are normal.  ____________________________________________   LABS (all labs ordered are listed, but only abnormal results are displayed)  Labs Reviewed  BASIC METABOLIC PANEL - Abnormal; Notable for the following components:      Result Value   Glucose, Bld 106 (*)    BUN 25 (*)    Creatinine, Ser 1.61 (*)    GFR calc non Af Amer 37 (*)    GFR calc Af Amer 43 (*)    All other components within normal limits  CBC - Abnormal; Notable for the following components:   Hemoglobin 12.0 (*)    HCT 37.1 (*)    RDW 15.9 (*)    All other components within normal limits  SARS CORONAVIRUS 2 (TAT 6-24 HRS)  TROPONIN I (HIGH SENSITIVITY)    ____________________________________________  EKG  ED ECG REPORT I, Lavonia Drafts, the attending physician, personally viewed and interpreted this ECG.  Date: 12/29/2019  Rhythm: normal sinus rhythm QRS Axis: Left axis deviation Intervals: normal ST/T Wave abnormalities: normal Narrative Interpretation: no evidence of acute ischemia  ____________________________________________  RADIOLOGY  Chest x-ray, consistent with pulmonary fibrosis, none ____________________________________________   PROCEDURES  Procedure(s) performed: No  Procedures   Critical Care performed: No ____________________________________________   INITIAL IMPRESSION / ASSESSMENT AND PLAN / ED COURSE  Pertinent labs & imaging results that were available during my care of the patient were reviewed by me and considered in my medical decision making (see chart for details).  Patient presents with mild shortness of breath, cough in the setting of known pulmonary fibrosis.  Chest x-ray is overall reassuring, lab work is unremarkable.  Patient is afebrile, treated with duo nebs with significant improvement.  Oxygen saturations and work of breathing is normal.  He feels comfortable going home and following up with pulmonology, will add doxycycline and prednisone    ____________________________________________   FINAL CLINICAL IMPRESSION(S) / ED DIAGNOSES  Final diagnoses:  Pulmonary fibrosis ([place])        Note:  This document was prepared using Dragon voice recognition software and may include unintentional dictation errors.   Lavonia Drafts, MD 12/29/19 2306

## 2019-12-29 NOTE — ED Notes (Signed)
Pt given phone and daughter dialed for pt, pt NAD with no further needs

## 2019-12-29 NOTE — ED Notes (Signed)
Blanket given to pt by zach NT

## 2019-12-29 NOTE — ED Notes (Signed)
No peripheral IV placed this visit.    Discharge instructions reviewed with patient. Questions fielded by this RN. Patient verbalizes understanding of instructions. Patient discharged home in stable condition per kinner. No acute distress noted at time of discharge.    Pt wheeled to lobby for called daughter to pick up

## 2019-12-30 LAB — SARS CORONAVIRUS 2 (TAT 6-24 HRS): SARS Coronavirus 2: NEGATIVE

## 2020-01-02 ENCOUNTER — Ambulatory Visit: Payer: Medicare Other | Admitting: Nurse Practitioner

## 2020-01-02 ENCOUNTER — Encounter: Payer: Self-pay | Admitting: Nurse Practitioner

## 2020-01-02 ENCOUNTER — Other Ambulatory Visit: Payer: Self-pay

## 2020-01-02 VITALS — BP 118/60 | HR 74 | Temp 97.4°F | Resp 22

## 2020-01-02 DIAGNOSIS — H938X1 Other specified disorders of right ear: Secondary | ICD-10-CM | POA: Diagnosis not present

## 2020-01-02 NOTE — Progress Notes (Signed)
Careteam: Patient Care Team: Baxter Hire, MD as PCP - General (Internal Medicine)  Advanced Directive information    Allergies  Allergen Reactions  . Amoxicillin Other (See Comments)    DID THE REACTION INVOLVE: Swelling of the face/tongue/throat, SOB, or low BP? Unknown Sudden or severe rash/hives, skin peeling, or the inside of the mouth or nose? Unknown Did it require medical treatment? Unknown When did it last happen?Unknown If all above answers are "NO", may proceed with cephalosporin use.   . Codeine Nausea And Vomiting  . Rapaflo [Silodosin] Other (See Comments)    Reaction:  Unknown   . Tramadol Nausea And Vomiting and Other (See Comments)    Pt states that this medication makes him feel crazy.    . Trimethoprim Other (See Comments)    Reaction: unknown    Chief Complaint  Patient presents with  . Acute Visit    right ear problems     HPI: Patient is a 86 y.o. male seen in today at the [facility] for right ear issues.  Reports a nurse over at assisted living cleaned his ear out- it was filled with wax. Now it feels like it is stopped up again and has been hurting him. Reports no pain or tenderness at this time.  Chronic sinus congestion, takes 2 nasal sprays.  No drainage but can feel the wax No fevers or chills.   Continues on doxycyline and prednisone from ED visit on 1/10 due to cough, shortness of breath with hx of COPD and pulmonary fibrosis- reports cough and shortness of breath have improved. Continues on medication for 4 more days.  Review of Systems:  Review of Systems  HENT: Positive for congestion and hearing loss. Negative for ear discharge, ear pain, sinus pain and sore throat.   Respiratory: Positive for cough (chronic) and shortness of breath (chronic-- mask makes it worse. ).     Past Medical History:  Diagnosis Date  . Asthma   . BPH (benign prostatic hyperplasia)   . Bronchitis   . Cancer ([place])   . COPD (chronic  obstructive pulmonary disease) ([place])   . DVT of leg (deep venous thrombosis) ([place])   . Elevated PSA   . Emphysema lung ([place])   . Emphysema of lung ([place])   . Hyperlipidemia   . Nocturia   . Pulmonary fibrosis ([place])   . Pulmonary fibrosis ([place])   . Skin cancer    Past Surgical History:  Procedure Laterality Date  . CATARACT EXTRACTION    . HERNIA REPAIR    . TONSILLECTOMY     Social History:   reports that he has quit smoking. He has never used smokeless tobacco. He reports that he does not drink alcohol or use drugs.  Family History  Problem Relation Age of Onset  . Heart Problems Brother   . Kidney disease Neg Hx   . Prostate cancer Neg Hx     Medications: Patient's Medications  New Prescriptions   No medications on file  Previous Medications   ALBUTEROL (PROVENTIL HFA;VENTOLIN HFA) 108 (90 BASE) MCG/ACT INHALER    Inhale 2 puffs into the lungs every 6 (six) hours as needed for wheezing or shortness of breath.   AMIODARONE (PACERONE) 200 MG TABLET    Take 1 tablet (200 mg total) by mouth 2 (two) times daily.   APIXABAN (ELIQUIS) 2.5 MG TABS TABLET    Take 1 tablet (2.5 mg total) by mouth 2 (two) times daily.  ATORVASTATIN (LIPITOR) 20 MG TABLET    Take 1 tablet (20 mg total) by mouth daily at 6 PM.   AZELASTINE (ASTELIN) 0.1 % NASAL SPRAY    Place 1 spray into both nostrils daily.   BUDESONIDE (PULMICORT) 0.5 MG/2ML NEBULIZER SOLUTION    Take 0.5 mg by nebulization daily.   DEXTROMETHORPHAN-GUAIFENESIN (MUCINEX DM) 30-600 MG 12HR TABLET    Take 1 tablet by mouth 2 (two) times daily.   DOXYCYCLINE (VIBRA-TABS) 100 MG TABLET    Take 1 tablet (100 mg total) by mouth 2 (two) times daily.   FEEDING SUPPLEMENT, ENSURE ENLIVE, (ENSURE ENLIVE) LIQD    Take 237 mLs by mouth 2 (two) times daily between meals.   FINASTERIDE (PROSCAR) 5 MG TABLET    Take 1 tablet (5 mg total) by mouth daily.   GABAPENTIN (NEURONTIN) 100 MG CAPSULE    Take 100-200 mg by mouth 2 (two) times daily. Take  2capsules (200mg ) by mouth every night and 1 capsule (100mg ) by mouth every morning.   GUAIFENESIN (MUCINEX) 600 MG 12 HR TABLET    Take 1 tablet (600 mg total) by mouth 2 (two) times daily.   IPRATROPIUM-ALBUTEROL (DUONEB) 0.5-2.5 (3) MG/3ML SOLN    Inhale 3 mLs into the lungs 4 (four) times daily.   LORAZEPAM (ATIVAN) 0.5 MG TABLET    Take 1 tablet (0.5 mg total) by mouth daily as needed for anxiety.   MIDODRINE (PROAMATINE) 10 MG TABLET    Take 1 tablet (10 mg total) by mouth 3 (three) times daily with meals.   MULTIPLE VITAMINS-MINERALS (OCUVITE ADULT 50+ PO)    Take 1 capsule by mouth daily.   NAPHAZOLINE-GLYCERIN (CLEAR EYES REDNESS) 0.012-0.2 % SOLN    Place 1-2 drops into both eyes 4 (four) times daily as needed for eye irritation.   PANTOPRAZOLE (PROTONIX) 40 MG TABLET    Take 1 tablet (40 mg total) by mouth 2 (two) times daily.   PREDNISONE (DELTASONE) 50 MG TABLET    Take 1 tablet (50 mg total) by mouth daily with breakfast.   TAMSULOSIN (FLOMAX) 0.4 MG CAPS CAPSULE    Take 2 capsules (0.8 mg total) by mouth daily.  Modified Medications   No medications on file  Discontinued Medications   No medications on file    Physical Exam:  Vitals:   01/02/20 0905  BP: 118/60  Pulse: 74  Resp: (!) 22  Temp: (!) 97.4 F (36.3 C)  SpO2: 97%   There is no height or weight on file to calculate BMI. Wt Readings from Last 3 Encounters:  12/29/19 152 lb (68.9 kg)  12/24/19 152 lb (68.9 kg)  08/22/19 154 lb 6 oz (70 kg)    Physical Exam Constitutional:      Appearance: Normal appearance.  HENT:     Head: Normocephalic and atraumatic.     Right Ear: Tympanic membrane, ear canal and external ear normal. There is no impacted cerumen.     Left Ear: Tympanic membrane, ear canal and external ear normal. There is no impacted cerumen.  Cardiovascular:     Rate and Rhythm: Normal rate.  Pulmonary:     Effort: Pulmonary effort is normal.     Breath sounds: Normal breath sounds.   Musculoskeletal:     Cervical back: Normal range of motion and neck supple. No tenderness.  Lymphadenopathy:     Cervical: No cervical adenopathy.  Neurological:     Mental Status: He is alert.     Labs reviewed:  Basic Metabolic Panel: Recent Labs    03/03/19 1800 03/04/19 0733 03/05/19 0430 05/04/19 2213 12/29/19 1401  NA 135   < > 138 139 139  K 4.2   < > 4.3 4.2 4.5  CL 101   < > 106 102 105  CO2 26   < > 24 29 24   GLUCOSE 131*   < > 153* 113* 106*  BUN 21   < > 24* 23 25*  CREATININE 1.23   < > 1.00 1.06 1.61*  CALCIUM 9.1   < > 9.1 9.4 9.0  MG 2.5*  --   --   --   --   PHOS 2.2*  --  3.3  --   --    < > = values in this interval not displayed.   Liver Function Tests: Recent Labs    03/02/19 0803 03/03/19 1800 05/04/19 2213  AST 25 46* 26  ALT 16 23 22   ALKPHOS 69 72 93  BILITOT 0.8 0.4 0.5  PROT 7.2 7.6 7.3  ALBUMIN 3.4* 4.1 3.7   No results for input(s): LIPASE, AMYLASE in the last 8760 hours. No results for input(s): AMMONIA in the last 8760 hours. CBC: Recent Labs    03/03/19 1800 03/04/19 0733 03/05/19 0430 05/04/19 2213 12/29/19 1401  WBC 12.0*   < > 11.4* 12.4* 8.8  NEUTROABS 9.7*  --   --  6.2  --   HGB 12.8*   < > 12.1* 13.7 12.0*  HCT 39.3   < > 37.1* 41.4 37.1*  MCV 84.2   < > 83.7 83.0 87.1  PLT 338   < > 321 328 267   < > = values in this interval not displayed.   Lipid Panel: No results for input(s): CHOL, HDL, LDLCALC, TRIG, CHOLHDL, LDLDIRECT in the last 8760 hours. TSH: No results for input(s): TSH in the last 8760 hours. A1C: No results found for: HGBA1C   Assessment/Plan 1. Sensation of fullness in right ear -exam benign and without cerumen impaction. Pt with chronic congestion and reports the use of 2 nasal sprays daily. Not having pain or discomfort at this time Encouraged to start  claritin or zyrtec (can use generic) 10 mg by mouth daily to help with this.   Carlos American. [place], [facility] [phone]

## 2020-01-09 ENCOUNTER — Non-Acute Institutional Stay: Payer: Medicare Other | Admitting: Adult Health Nurse Practitioner

## 2020-01-09 ENCOUNTER — Other Ambulatory Visit: Payer: Self-pay

## 2020-01-09 DIAGNOSIS — Z515 Encounter for palliative care: Secondary | ICD-10-CM

## 2020-01-09 DIAGNOSIS — J84112 Idiopathic pulmonary fibrosis: Secondary | ICD-10-CM

## 2020-01-09 NOTE — Progress Notes (Signed)
Wesley Newton: [phone]  Fax: [phone]  PATIENT NAME: Wesley Newton DOB: 1927/11/21 MRN: [id number]  PRIMARY CARE PROVIDER:   Baxter Hire, MD  REFERRING PROVIDER:  Baxter Hire, MD Wesley Newton,  [place] [id number]  RESPONSIBLE PARTY:   Wesley Newton     RECOMMENDATIONS and PLAN:  1.  Advanced care planning. Patient is a DNR  2.  Pulmonary fibrosis/COPD.  Patient went to ER on 12/29/2019 for increased cough and SOB.  Chest xray showed stable pulmonary fibrosis.  Breathing improved with duonebs.  Was discharged with doxycycline and prednisone. Was evaluated by Wesley Newton yesterday with no new orders.  Patient does take duoneb and budesonide neb treatments daily, mucinex BID, and uses flonase for allergies with claritin PRN. Patient states that his breathing and cough have improved but he is still coughing more than before this episode. Have encouraged the use of a humidifier, especially one that uses essential oils such as eucalypyus essential oil that can help with the respiratory system.  He has a humidifier but states that it has too much output.  Has encouraged going to the pharmacy or having his daughter help him find one online that is smaller.  Will call back for follow up in two weeks.  Continue follow up and recommendations by cardiology.  Patient has received his first Wesley Newton vaccination.  3.  CHF.  Patient is being seen by Wesley Newton, cardiology.  See above for increased SOB and cough.  Denies chest pain, dizziness, palpitations.  Has trace edema today; not wearing TED hose.  Does state that he has been wearing TED hose more often and does notice less edema when he does wear them.  Encouraged to wear them daily.  Sleeps with head of bed elevated and with 2 pillows but this is unchanged for a long time. Continue follow up and recommendations by cardiology.  4.  Pain.  Does  get arthritis pain in his hands, hips, and lower back.  Gets relief with Tylenol.  Continue using Tylenol as needed for arthritis pain.  Palliative care will continue to monitor for symptom management/decline and make recommendations as needed.      I spent 60 minutes providing this consultation, including time with patient/family, chart review, provider coordination, and documentation . More than 50% of the time in this consultation was spent coordinating communication.   HISTORY OF PRESENT ILLNESS:  Wesley Newton is a 86 y.o. year old male with multiple medical problems including COPD, pulmonary emphysema, chronic cough, dyspnea on exertion, asthmatic bronchitis, diastolic heart failure, hypertension, hypercholesterolemia, gerd, rhinitis, anemia, BPH, lumbago. Palliative Care was asked to help address goals of care.   CODE STATUS: DNR  PPS: 60% HOSPICE ELIGIBILITY/DIAGNOSIS: TBD  PHYSICAL EXAM:  O2 98% on RA  HR 77 General: NAD, frail appearing, thin Cardiovascular: regular rate and rhythm Pulmonary: some rales and wheezing heard over RUL; Normal respiratory effort Abdomen: soft, nontender, + bowel sounds Extremities: trace edema to bilateral lower extremities,Arthritic changes in bilateral hands Skin: no rashes Neurological: Weakness but otherwise nonfocal  PAST MEDICAL HISTORY:  Past Medical History:  Diagnosis Date  . Asthma   . BPH (benign prostatic hyperplasia)   . Bronchitis   . Cancer ([place])   . COPD (chronic obstructive pulmonary disease) ([place])   . DVT of leg (deep venous thrombosis) ([place])   . Elevated PSA   . Emphysema lung ([place])   . Emphysema  of lung ([place])   . Hyperlipidemia   . Nocturia   . Pulmonary fibrosis ([place])   . Pulmonary fibrosis ([place])   . Skin cancer     SOCIAL HX:  Social History   Tobacco Use  . Smoking status: Former Research scientist (life sciences)  . Smokeless tobacco: Never Used  . Tobacco comment: quit 40 years  Substance Use Topics  . Alcohol use: No     Alcohol/week: 0.0 standard drinks    ALLERGIES:  Allergies  Allergen Reactions  . Amoxicillin Other (See Comments)    DID THE REACTION INVOLVE: Swelling of the face/tongue/throat, SOB, or low BP? Unknown Sudden or severe rash/hives, skin peeling, or the inside of the mouth or nose? Unknown Did it require medical treatment? Unknown When did it last happen?Unknown If all above answers are "NO", may proceed with cephalosporin use.   . Codeine Nausea And Vomiting  . Rapaflo [Silodosin] Other (See Comments)    Reaction:  Unknown   . Tramadol Nausea And Vomiting and Other (See Comments)    Pt states that this medication makes him feel crazy.    . Trimethoprim Other (See Comments)    Reaction: unknown     PERTINENT MEDICATIONS:  Outpatient Encounter Medications as of 01/09/2020  Medication Sig  . albuterol (PROVENTIL HFA;VENTOLIN HFA) 108 (90 Base) MCG/ACT inhaler Inhale 2 puffs into the lungs every 6 (six) hours as needed for wheezing or shortness of breath.  Marland Kitchen amiodarone (PACERONE) 200 MG tablet Take 1 tablet (200 mg total) by mouth 2 (two) times daily. (Patient taking differently: Take 200 mg by mouth daily. )  . apixaban (ELIQUIS) 2.5 MG TABS tablet Take 1 tablet (2.5 mg total) by mouth 2 (two) times daily.  Marland Kitchen atorvastatin (LIPITOR) 20 MG tablet Take 1 tablet (20 mg total) by mouth daily at 6 PM.  . azelastine (ASTELIN) 0.1 % nasal spray Place 1 spray into both nostrils daily.  . budesonide (PULMICORT) 0.5 MG/2ML nebulizer solution Take 0.5 mg by nebulization daily.  Marland Kitchen dextromethorphan-guaiFENesin (MUCINEX DM) 30-600 MG 12hr tablet Take 1 tablet by mouth 2 (two) times daily.  Marland Kitchen doxycycline (VIBRA-TABS) 100 MG tablet Take 1 tablet (100 mg total) by mouth 2 (two) times daily.  . feeding supplement, ENSURE ENLIVE, (ENSURE ENLIVE) LIQD Take 237 mLs by mouth 2 (two) times daily between meals.  . finasteride (PROSCAR) 5 MG tablet Take 1 tablet (5 mg total) by mouth daily.  Marland Kitchen gabapentin  (NEURONTIN) 100 MG capsule Take 100-200 mg by mouth 2 (two) times daily. Take 2capsules (200mg ) by mouth every night and 1 capsule (100mg ) by mouth every morning.  Marland Kitchen guaiFENesin (MUCINEX) 600 MG 12 hr tablet Take 1 tablet (600 mg total) by mouth 2 (two) times daily.  Marland Kitchen ipratropium-albuterol (DUONEB) 0.5-2.5 (3) MG/3ML SOLN Inhale 3 mLs into the lungs 4 (four) times daily.  Marland Kitchen LORazepam (ATIVAN) 0.5 MG tablet Take 1 tablet (0.5 mg total) by mouth daily as needed for anxiety.  . midodrine (PROAMATINE) 10 MG tablet Take 1 tablet (10 mg total) by mouth 3 (three) times daily with meals.  . Multiple Vitamins-Minerals (OCUVITE ADULT 50+ PO) Take 1 capsule by mouth daily.  . naphazoline-glycerin (CLEAR EYES REDNESS) 0.012-0.2 % SOLN Place 1-2 drops into both eyes 4 (four) times daily as needed for eye irritation.  . pantoprazole (PROTONIX) 40 MG tablet Take 1 tablet (40 mg total) by mouth 2 (two) times daily.  . predniSONE (DELTASONE) 50 MG tablet Take 1 tablet (50 mg total) by mouth  daily with breakfast.  . tamsulosin (FLOMAX) 0.4 MG CAPS capsule Take 2 capsules (0.8 mg total) by mouth daily.   No facility-administered encounter medications on file as of 01/09/2020.      Kazumi Lachney Jenetta Downer, NP

## 2020-01-22 ENCOUNTER — Other Ambulatory Visit: Payer: Self-pay

## 2020-01-22 ENCOUNTER — Non-Acute Institutional Stay: Payer: Medicare Other | Admitting: Adult Health Nurse Practitioner

## 2020-01-22 DIAGNOSIS — J84112 Idiopathic pulmonary fibrosis: Secondary | ICD-10-CM

## 2020-01-22 DIAGNOSIS — Z515 Encounter for palliative care: Secondary | ICD-10-CM

## 2020-01-22 NOTE — Progress Notes (Signed)
[facility] Consult Note Telephone: [phone]  Fax: [phone]  PATIENT NAME: Wesley Newton DOB: 1927/06/08 MRN: [id number]  PRIMARY CARE PROVIDER:   Baxter Hire, MD  REFERRING PROVIDER:  Baxter Hire, MD [place],  [place] [address]  RESPONSIBLE PARTY:   Granddaughter Audra-HCPOA  Due to the COVID-19 crisis, this visit was done via telemedicine and it was initiated and consent by this patient and or family. Video-audio (telehealth) contact was unable to be done due to technical barriers from the patient's side      RECOMMENDATIONS and PLAN:  1.  Advanced care planning. Patient is a DNR  2.  Pulmonary fibrosis/COPD.  This is follow up on patient's increased cough and SOB.  States that his SOB is improving.  States that his cough has improved some but still having more than usual.  States that he only takes his mucinex once a day.  Have encouraged him to take BID to see if that gives better relief.  Denies fever, chest pain, edema, N/V/D.  Have scheduled next appointment in 3 weeks.  3. Pain.  Patient is still having increased arthritis pain.  He takes tylenol 650 mg ER 2 tabs PRN.  States that he usually only takes it once a day. States that the pain is worse in the morning and gradually eases off but is having more lingering pain. Have suggested taking the tylenol BID to see if that gives better pain relief.  Did go over not to take more than 3000 mg of acetaminophen in a 24 hour period.  I spent 25 minutes providing this consultation, including time with patient/family, chart review, provider coordination, and documentation. More than 50% of the time in this consultation was spent coordinating communication.   HISTORY OF PRESENT ILLNESS:  Wesley Newton is a 86 y.o. year old male with multiple medical problems including COPD, pulmonary emphysema, chronic cough, dyspnea on exertion, asthmatic bronchitis,  diastolic heart failure, hypertension, hypercholesterolemia, gerd, rhinitis, anemia, BPH, lumbago. Palliative Care was asked to help address goals of care.   CODE STATUS: DNR  PPS: 60% HOSPICE ELIGIBILITY/DIAGNOSIS: TBD  PHYSICAL EXAM:   Deferred   PAST MEDICAL HISTORY:  Past Medical History:  Diagnosis Date  . Asthma   . BPH (benign prostatic hyperplasia)   . Bronchitis   . Cancer ([place])   . COPD (chronic obstructive pulmonary disease) ([place])   . DVT of leg (deep venous thrombosis) ([place])   . Elevated PSA   . Emphysema lung ([place])   . Emphysema of lung ([place])   . Hyperlipidemia   . Nocturia   . Pulmonary fibrosis ([place])   . Pulmonary fibrosis ([place])   . Skin cancer     SOCIAL HX:  Social History   Tobacco Use  . Smoking status: Former Research scientist (life sciences)  . Smokeless tobacco: Never Used  . Tobacco comment: quit 40 years  Substance Use Topics  . Alcohol use: No    Alcohol/week: 0.0 standard drinks    ALLERGIES:  Allergies  Allergen Reactions  . Amoxicillin Other (See Comments)    DID THE REACTION INVOLVE: Swelling of the face/tongue/throat, SOB, or low BP? Unknown Sudden or severe rash/hives, skin peeling, or the inside of the mouth or nose? Unknown Did it require medical treatment? Unknown When did it last happen?Unknown If all above answers are "NO", may proceed with cephalosporin use.   . Codeine Nausea And Vomiting  . Rapaflo [Silodosin] Other (See  Comments)    Reaction:  Unknown   . Tramadol Nausea And Vomiting and Other (See Comments)    Pt states that this medication makes him feel crazy.    . Trimethoprim Other (See Comments)    Reaction: unknown     PERTINENT MEDICATIONS:  Outpatient Encounter Medications as of 01/22/2020  Medication Sig  . albuterol (PROVENTIL HFA;VENTOLIN HFA) 108 (90 Base) MCG/ACT inhaler Inhale 2 puffs into the lungs every 6 (six) hours as needed for wheezing or shortness of breath.  Marland Kitchen amiodarone (PACERONE) 200 MG tablet Take 1 tablet (200 mg  total) by mouth 2 (two) times daily. (Patient taking differently: Take 200 mg by mouth daily. )  . apixaban (ELIQUIS) 2.5 MG TABS tablet Take 1 tablet (2.5 mg total) by mouth 2 (two) times daily.  Marland Kitchen atorvastatin (LIPITOR) 20 MG tablet Take 1 tablet (20 mg total) by mouth daily at 6 PM.  . azelastine (ASTELIN) 0.1 % nasal spray Place 1 spray into both nostrils daily.  . budesonide (PULMICORT) 0.5 MG/2ML nebulizer solution Take 0.5 mg by nebulization daily.  Marland Kitchen dextromethorphan-guaiFENesin (MUCINEX DM) 30-600 MG 12hr tablet Take 1 tablet by mouth 2 (two) times daily.  Marland Kitchen doxycycline (VIBRA-TABS) 100 MG tablet Take 1 tablet (100 mg total) by mouth 2 (two) times daily.  . feeding supplement, ENSURE ENLIVE, (ENSURE ENLIVE) LIQD Take 237 mLs by mouth 2 (two) times daily between meals.  . finasteride (PROSCAR) 5 MG tablet Take 1 tablet (5 mg total) by mouth daily.  Marland Kitchen gabapentin (NEURONTIN) 100 MG capsule Take 100-200 mg by mouth 2 (two) times daily. Take 2capsules (200mg ) by mouth every night and 1 capsule (100mg ) by mouth every morning.  Marland Kitchen guaiFENesin (MUCINEX) 600 MG 12 hr tablet Take 1 tablet (600 mg total) by mouth 2 (two) times daily.  Marland Kitchen ipratropium-albuterol (DUONEB) 0.5-2.5 (3) MG/3ML SOLN Inhale 3 mLs into the lungs 4 (four) times daily.  Marland Kitchen LORazepam (ATIVAN) 0.5 MG tablet Take 1 tablet (0.5 mg total) by mouth daily as needed for anxiety.  . midodrine (PROAMATINE) 10 MG tablet Take 1 tablet (10 mg total) by mouth 3 (three) times daily with meals.  . Multiple Vitamins-Minerals (OCUVITE ADULT 50+ PO) Take 1 capsule by mouth daily.  . naphazoline-glycerin (CLEAR EYES REDNESS) 0.012-0.2 % SOLN Place 1-2 drops into both eyes 4 (four) times daily as needed for eye irritation.  . pantoprazole (PROTONIX) 40 MG tablet Take 1 tablet (40 mg total) by mouth 2 (two) times daily.  . predniSONE (DELTASONE) 50 MG tablet Take 1 tablet (50 mg total) by mouth daily with breakfast.  . tamsulosin (FLOMAX) 0.4 MG CAPS  capsule Take 2 capsules (0.8 mg total) by mouth daily.   No facility-administered encounter medications on file as of 01/22/2020.      Dakoda Bassette Jenetta Downer, NP

## 2020-02-13 ENCOUNTER — Other Ambulatory Visit: Payer: Self-pay

## 2020-02-13 ENCOUNTER — Non-Acute Institutional Stay: Payer: Medicare Other | Admitting: Adult Health Nurse Practitioner

## 2020-02-13 DIAGNOSIS — Z515 Encounter for palliative care: Secondary | ICD-10-CM

## 2020-02-13 DIAGNOSIS — J84112 Idiopathic pulmonary fibrosis: Secondary | ICD-10-CM

## 2020-02-13 NOTE — Progress Notes (Signed)
[facility] Consult Note Telephone: [phone]  Fax: [phone]  PATIENT NAME: Wesley Newton DOB: February 01, 1927 MRN: [id number]  PRIMARY CARE PROVIDER:   Baxter Hire, MD  REFERRING PROVIDER:  Baxter Hire, MD [place],   [id number]  RESPONSIBLE PARTY:   Granddaughter Audra-HCPOA       RECOMMENDATIONS and PLAN:  1.  Advanced care planning. Patient is a DNR  2.  Pulmonary fibrosis/COPD.  Patient has been having some increased DOE.  Is taking more frequent rest breaks.  States that he was having more congestion and coughing up more phlegm.  States that since starting mucinex BID that this has helped.  Continue to use his nebulizers as ordered.  States having appointment with Dr. Raul Del in March.  Continue follow up and recommendations by Dr. Raul Del.  3.  CHF. See above for increased DOE and cough.  Patient states that he has been getting some lower leg pain when walking that goes away with rest.  States that sometimes when he walks he will get some edema to his ankles. States that he has compression stockings but due to his arthritis they hard to get on.  Discussed possibly using the zip up compression stockings.  States that he used to walk for 30 minutes and now can't walk for more than 15 minutes at a time.  States that he can't walk up stairs anymore and uses the elevator.  Is able to walk down stairs.  States that some house chores are becoming harder to do, such as stripping and remaking the bed.  States that he does have someone who comes into the home to clean monthly.  Have encouraged using more services like this to help conserve his energy. We discussed disease progression at length.  Will reach out to Dr. Etta Quill office with these changes.  4.  Pain.  Patient is having more arthritis pain.  He takes tylenol 8 hour (650mg ) 2 tabs PRN.  Have discussed taking this twice a day scheduled, 2 tabs in the  morning and 2 tabs at bedtime to see if this provides better relief.  Will call in one week to check to see if this is helpful and to set up next appointment  Patient is having decline related to disease progression.  Talked at length about his disease progression and ways to conserve energy. He has not had any falls, infection, or hospital visits since last visit.  Have reached out to SW at [facility] with concerns about his decreased mobility and ways to utilize services at the facility to help conserve his energy.  SW also brought up that she has talked with him before about moving to assisted living.  This will be an ongoing discussion.    I spent 80 minutes providing this consultation,  from 1:00 to 2:20. More than 50% of the time in this consultation was spent coordinating communication.   HISTORY OF PRESENT ILLNESS:  Wesley Newton is a 86 y.o. year old male with multiple medical problems including COPD, pulmonary emphysema, chronic cough, dyspnea on exertion, asthmatic bronchitis, diastolic heart failure, hypertension, hypercholesterolemia, gerd, rhinitis, anemia, BPH, lumbago. Palliative Care was asked to help address goals of care.   CODE STATUS: DNR  PPS: 60% HOSPICE ELIGIBILITY/DIAGNOSIS: TBD  PHYSICAL EXAM:   General: NAD, frail appearing, thin Cardiovascular: regular rate and rhythm Pulmonary: wheezing heard throughout; normal respiratory effort Extremities: no edema, arthritic changes to fingers.  Has  swelling to medial side of left knee with no erythema or warmth or tenderness to touch Neurological: Weakness but otherwise nonfocal   PAST MEDICAL HISTORY:  Past Medical History:  Diagnosis Date  . Asthma   . BPH (benign prostatic hyperplasia)   . Bronchitis   . Cancer ([place])   . COPD (chronic obstructive pulmonary disease) ([place])   . DVT of leg (deep venous thrombosis) ([place])   . Elevated PSA   . Emphysema lung ([place])   . Emphysema of lung ([place])   . Hyperlipidemia   .  Nocturia   . Pulmonary fibrosis ([place])   . Pulmonary fibrosis ([place])   . Skin cancer     SOCIAL HX:  Social History   Tobacco Use  . Smoking status: Former Research scientist (life sciences)  . Smokeless tobacco: Never Used  . Tobacco comment: quit 40 years  Substance Use Topics  . Alcohol use: No    Alcohol/week: 0.0 standard drinks    ALLERGIES:  Allergies  Allergen Reactions  . Amoxicillin Other (See Comments)    DID THE REACTION INVOLVE: Swelling of the face/tongue/throat, SOB, or low BP? Unknown Sudden or severe rash/hives, skin peeling, or the inside of the mouth or nose? Unknown Did it require medical treatment? Unknown When did it last happen?Unknown If all above answers are "NO", may proceed with cephalosporin use.   . Codeine Nausea And Vomiting  . Rapaflo [Silodosin] Other (See Comments)    Reaction:  Unknown   . Tramadol Nausea And Vomiting and Other (See Comments)    Pt states that this medication makes him feel crazy.    . Trimethoprim Other (See Comments)    Reaction: unknown     PERTINENT MEDICATIONS:  Outpatient Encounter Medications as of 02/13/2020  Medication Sig  . albuterol (PROVENTIL HFA;VENTOLIN HFA) 108 (90 Base) MCG/ACT inhaler Inhale 2 puffs into the lungs every 6 (six) hours as needed for wheezing or shortness of breath.  Marland Kitchen amiodarone (PACERONE) 200 MG tablet Take 1 tablet (200 mg total) by mouth 2 (two) times daily. (Patient taking differently: Take 200 mg by mouth daily. )  . apixaban (ELIQUIS) 2.5 MG TABS tablet Take 1 tablet (2.5 mg total) by mouth 2 (two) times daily.  Marland Kitchen atorvastatin (LIPITOR) 20 MG tablet Take 1 tablet (20 mg total) by mouth daily at 6 PM.  . azelastine (ASTELIN) 0.1 % nasal spray Place 1 spray into both nostrils daily.  . budesonide (PULMICORT) 0.5 MG/2ML nebulizer solution Take 0.5 mg by nebulization daily.  Marland Kitchen dextromethorphan-guaiFENesin (MUCINEX DM) 30-600 MG 12hr tablet Take 1 tablet by mouth 2 (two) times daily.  Marland Kitchen doxycycline (VIBRA-TABS) 100  MG tablet Take 1 tablet (100 mg total) by mouth 2 (two) times daily.  . feeding supplement, ENSURE ENLIVE, (ENSURE ENLIVE) LIQD Take 237 mLs by mouth 2 (two) times daily between meals.  . finasteride (PROSCAR) 5 MG tablet Take 1 tablet (5 mg total) by mouth daily.  Marland Kitchen gabapentin (NEURONTIN) 100 MG capsule Take 100-200 mg by mouth 2 (two) times daily. Take 2capsules (200mg ) by mouth every night and 1 capsule (100mg ) by mouth every morning.  Marland Kitchen guaiFENesin (MUCINEX) 600 MG 12 hr tablet Take 1 tablet (600 mg total) by mouth 2 (two) times daily.  Marland Kitchen ipratropium-albuterol (DUONEB) 0.5-2.5 (3) MG/3ML SOLN Inhale 3 mLs into the lungs 4 (four) times daily.  Marland Kitchen LORazepam (ATIVAN) 0.5 MG tablet Take 1 tablet (0.5 mg total) by mouth daily as needed for anxiety.  . midodrine (PROAMATINE) 10 MG tablet  Take 1 tablet (10 mg total) by mouth 3 (three) times daily with meals.  . Multiple Vitamins-Minerals (OCUVITE ADULT 50+ PO) Take 1 capsule by mouth daily.  . naphazoline-glycerin (CLEAR EYES REDNESS) 0.012-0.2 % SOLN Place 1-2 drops into both eyes 4 (four) times daily as needed for eye irritation.  . pantoprazole (PROTONIX) 40 MG tablet Take 1 tablet (40 mg total) by mouth 2 (two) times daily.  . predniSONE (DELTASONE) 50 MG tablet Take 1 tablet (50 mg total) by mouth daily with breakfast.  . tamsulosin (FLOMAX) 0.4 MG CAPS capsule Take 2 capsules (0.8 mg total) by mouth daily.   No facility-administered encounter medications on file as of 02/13/2020.     Rmani Kapusta Jenetta Downer, NP

## 2020-02-14 ENCOUNTER — Telehealth: Payer: Self-pay

## 2020-02-14 NOTE — Telephone Encounter (Signed)
At the direction of Amy NP, phone call placed to Dr. Etta Quill office to verify if Palliative care consult fax received and to follow up regarding sx of DOE with pain to his legs and edema. Fax not received. Note faxed to Cardiology office.

## 2020-05-13 ENCOUNTER — Other Ambulatory Visit: Payer: Self-pay | Admitting: Urology

## 2020-06-11 ENCOUNTER — Encounter: Payer: Self-pay | Admitting: Emergency Medicine

## 2020-06-11 ENCOUNTER — Emergency Department
Admission: EM | Admit: 2020-06-11 | Discharge: 2020-06-11 | Disposition: A | Discharge status: Home / Self Care | Payer: Medicare Other | Attending: Emergency Medicine | Admitting: Emergency Medicine

## 2020-06-11 ENCOUNTER — Emergency Department: Payer: Medicare Other

## 2020-06-11 DIAGNOSIS — M6281 Muscle weakness (generalized): Secondary | ICD-10-CM | POA: Insufficient documentation

## 2020-06-11 DIAGNOSIS — Z87891 Personal history of nicotine dependence: Secondary | ICD-10-CM | POA: Diagnosis not present

## 2020-06-11 DIAGNOSIS — Z79899 Other long term (current) drug therapy: Secondary | ICD-10-CM | POA: Insufficient documentation

## 2020-06-11 DIAGNOSIS — R42 Dizziness and giddiness: Secondary | ICD-10-CM | POA: Insufficient documentation

## 2020-06-11 DIAGNOSIS — J449 Chronic obstructive pulmonary disease, unspecified: Secondary | ICD-10-CM | POA: Insufficient documentation

## 2020-06-11 DIAGNOSIS — J45909 Unspecified asthma, uncomplicated: Secondary | ICD-10-CM | POA: Diagnosis not present

## 2020-06-11 DIAGNOSIS — J8410 Pulmonary fibrosis, unspecified: Secondary | ICD-10-CM | POA: Insufficient documentation

## 2020-06-11 DIAGNOSIS — R531 Weakness: Secondary | ICD-10-CM

## 2020-06-11 LAB — TSH: TSH: 1.907 u[IU]/mL (ref 0.350–4.500)

## 2020-06-11 LAB — BASIC METABOLIC PANEL WITH GFR
Anion gap: 9 (ref 5–15)
BUN: 32 mg/dL — ABNORMAL HIGH (ref 8–23)
CO2: 25 mmol/L (ref 22–32)
Calcium: 9.5 mg/dL (ref 8.9–10.3)
Chloride: 104 mmol/L (ref 98–111)
Creatinine, Ser: 1.75 mg/dL — ABNORMAL HIGH (ref 0.61–1.24)
GFR calc Af Amer: 39 mL/min — ABNORMAL LOW
GFR calc non Af Amer: 33 mL/min — ABNORMAL LOW
Glucose, Bld: 101 mg/dL — ABNORMAL HIGH (ref 70–99)
Potassium: 5 mmol/L (ref 3.5–5.1)
Sodium: 138 mmol/L (ref 135–145)

## 2020-06-11 LAB — BLOOD GAS, VENOUS
Acid-Base Excess: 1.5 mmol/L (ref 0.0–2.0)
Bicarbonate: 26.6 mmol/L (ref 20.0–28.0)
O2 Saturation: 76.2 %
Patient temperature: 37
pCO2, Ven: 43 mmHg — ABNORMAL LOW (ref 44.0–60.0)
pH, Ven: 7.4 (ref 7.250–7.430)
pO2, Ven: 41 mmHg (ref 32.0–45.0)

## 2020-06-11 LAB — URINALYSIS, COMPLETE (UACMP) WITH MICROSCOPIC
Bacteria, UA: NONE SEEN
Bilirubin Urine: NEGATIVE
Glucose, UA: NEGATIVE mg/dL
Hgb urine dipstick: NEGATIVE
Ketones, ur: NEGATIVE mg/dL
Leukocytes,Ua: NEGATIVE
Nitrite: NEGATIVE
Protein, ur: NEGATIVE mg/dL
Specific Gravity, Urine: 1.01 (ref 1.005–1.030)
Squamous Epithelial / HPF: NONE SEEN (ref 0–5)
pH: 5 (ref 5.0–8.0)

## 2020-06-11 LAB — CBC
HCT: 37 % — ABNORMAL LOW (ref 39.0–52.0)
Hemoglobin: 11.9 g/dL — ABNORMAL LOW (ref 13.0–17.0)
MCH: 28.7 pg (ref 26.0–34.0)
MCHC: 32.2 g/dL (ref 30.0–36.0)
MCV: 89.4 fL (ref 80.0–100.0)
Platelets: 301 10*3/uL (ref 150–400)
RBC: 4.14 MIL/uL — ABNORMAL LOW (ref 4.22–5.81)
RDW: 15.9 % — ABNORMAL HIGH (ref 11.5–15.5)
WBC: 8.9 10*3/uL (ref 4.0–10.5)
nRBC: 0 % (ref 0.0–0.2)

## 2020-06-11 LAB — TROPONIN I (HIGH SENSITIVITY): Troponin I (High Sensitivity): 7 ng/L (ref <18)

## 2020-06-11 NOTE — ED Provider Notes (Signed)
[facility] Emergency Department Provider Note ____________________________________________   First MD Initiated Contact with Patient 06/11/20 1036     (approximate)  I have reviewed the triage vital signs and the nursing notes.   HISTORY  Chief Complaint Dizziness and Weakness    HPI Wesley Newton is a 86 y.o. male with PMH as noted below including COPD and pulmonary fibrosis on home O2 as well as atrial fibrillation who presents with generalized weakness over the last several days, gradual onset, and worse this morning.  The patient reports some increased shortness of breath and cough as well.  He states he feels like his legs are weaker and will give out on him, but this is bilateral.  He denies nausea, vomiting, or diarrhea, and has no acute pain.  He denies any fever or chills.  The patient states that he was seen by his cardiologist yesterday, and his amiodarone dose was reduced by half.   Past Medical History:  Diagnosis Date  . Asthma   . BPH (benign prostatic hyperplasia)   . Bronchitis   . Cancer ([place])   . COPD (chronic obstructive pulmonary disease) ([place])   . DVT of leg (deep venous thrombosis) ([place])   . Elevated PSA   . Emphysema lung ([place])   . Emphysema of lung ([place])   . Hyperlipidemia   . Nocturia   . Pulmonary fibrosis ([place])   . Pulmonary fibrosis ([place])   . Skin cancer     Patient Active Problem List   Diagnosis Date Noted  . Atrial fibrillation with RVR ([place]) 05/04/2019  . Idiopathic pulmonary fibrosis ([place]) 03/05/2019  . Malnutrition of moderate degree 12/21/2018  . Influenza B 12/19/2018  . CAP (community acquired pneumonia) 12/19/2018  . Lumbar disc herniation 10/10/2017  . Degenerative disc disease 08/01/2017  . PAD (peripheral artery disease) ([place]) 04/25/2017  . BPH (benign prostatic hyperplasia) 01/10/2016  . Nocturia 01/10/2016  . Benign prostatic hyperplasia with urinary obstruction 01/10/2016  . AB (asthmatic  bronchitis) 12/17/2014  . Carotid artery disease ([place]) 12/04/2014  . GERD (gastroesophageal reflux disease) 12/04/2014  . H/O respiratory system disease 12/04/2014  . H/O peptic ulcer 12/04/2014  . Hypercholesterolemia 12/04/2014  . Unstable angina pectoris ([place]) 12/04/2014  . Amnesia 12/04/2014  . Chronic obstructive pulmonary disease ([place]) 06/11/2014  . Cephalalgia 06/11/2014  . HTN (hypertension) 06/11/2014    Past Surgical History:  Procedure Laterality Date  . CATARACT EXTRACTION    . HERNIA REPAIR    . TONSILLECTOMY      Prior to Admission medications   Medication Sig Start Date End Date Taking? Authorizing Provider  amiodarone (PACERONE) 200 MG tablet Take 1 tablet (200 mg total) by mouth 2 (two) times daily. Patient taking differently: Take 100 mg by mouth daily.  05/07/19  Yes Dustin Flock, MD  apixaban (ELIQUIS) 2.5 MG TABS tablet Take 1 tablet (2.5 mg total) by mouth 2 (two) times daily. 05/07/19  Yes Dustin Flock, MD  atorvastatin (LIPITOR) 20 MG tablet Take 1 tablet (20 mg total) by mouth daily at 6 PM. 05/07/19  Yes Dustin Flock, MD  azelastine (ASTELIN) 0.1 % nasal spray Place 1 spray into both nostrils daily. 01/10/19  Yes [provider]  budesonide (PULMICORT) 0.5 MG/2ML nebulizer solution Take 0.5 mg by nebulization daily.   Yes [provider]  finasteride (PROSCAR) 5 MG tablet Take 1 tablet (5 mg total) by mouth daily. 12/24/19  Yes McGowan, Larene Beach A, PA-C  gabapentin (NEURONTIN) 100 MG  capsule Take 100-200 mg by mouth 2 (two) times daily. Take 2capsules (200mg ) by mouth every night and 1 capsule (100mg ) by mouth every morning. 02/04/19  Yes [provider]  ipratropium-albuterol (DUONEB) 0.5-2.5 (3) MG/3ML SOLN Inhale 3 mLs into the lungs 4 (four) times daily. 01/14/19  Yes [provider]  LORazepam (ATIVAN) 0.5 MG tablet Take 1 tablet (0.5 mg total) by mouth daily as needed for anxiety. 05/07/19  Yes Dustin Flock, MD    midodrine (PROAMATINE) 10 MG tablet Take 1 tablet (10 mg total) by mouth 3 (three) times daily with meals. 05/07/19  Yes Dustin Flock, MD  Multiple Vitamins-Minerals (OCUVITE ADULT 50+ PO) Take 1 capsule by mouth daily.   Yes [provider]  pantoprazole (PROTONIX) 40 MG tablet Take 1 tablet (40 mg total) by mouth 2 (two) times daily. 05/07/19  Yes Dustin Flock, MD  tamsulosin (FLOMAX) 0.4 MG CAPS capsule TAKE 2 CAPSULES BY MOUTH EVERY DAY Patient taking differently: Take 0.4 mg by mouth daily after supper.  05/13/20  Yes Vaillancourt, Samantha, PA-C  albuterol (PROVENTIL HFA;VENTOLIN HFA) 108 (90 Base) MCG/ACT inhaler Inhale 2 puffs into the lungs every 6 (six) hours as needed for wheezing or shortness of breath.    [provider]  dextromethorphan-guaiFENesin (MUCINEX DM) 30-600 MG 12hr tablet Take 1 tablet by mouth 2 (two) times daily.    [provider]  feeding supplement, ENSURE ENLIVE, (ENSURE ENLIVE) LIQD Take 237 mLs by mouth 2 (two) times daily between meals. 12/21/18   Henreitta Leber, MD  guaiFENesin (MUCINEX) 600 MG 12 hr tablet Take 1 tablet (600 mg total) by mouth 2 (two) times daily. 05/07/19   Dustin Flock, MD  naphazoline-glycerin (CLEAR EYES REDNESS) 0.012-0.2 % SOLN Place 1-2 drops into both eyes 4 (four) times daily as needed for eye irritation. 12/21/18   Henreitta Leber, MD    Allergies Amoxicillin, Codeine, Rapaflo [silodosin], Tramadol, and Trimethoprim  Family History  Problem Relation Age of Onset  . Heart Problems Brother   . Kidney disease Neg Hx   . Prostate cancer Neg Hx     Social History Social History   Tobacco Use  . Smoking status: Former Research scientist (life sciences)  . Smokeless tobacco: Never Used  . Tobacco comment: quit 40 years  Substance Use Topics  . Alcohol use: No    Alcohol/week: 0.0 standard drinks  . Drug use: No    Review of Systems  Constitutional: No fever.  Positive for weakness. Eyes: No redness. ENT: No sore  throat. Cardiovascular: Denies chest pain. Respiratory: Positive for shortness of breath. Gastrointestinal: No vomiting or diarrhea.  Genitourinary: Negative for dysuria.  Musculoskeletal: Negative for back pain. Skin: Negative for rash. Neurological: Positive for headache.   ____________________________________________   PHYSICAL EXAM:  VITAL SIGNS: ED Triage Vitals  Enc Vitals Group     BP 06/11/20 1010 (!) 159/62     Pulse Rate 06/11/20 1010 69     Resp 06/11/20 1010 13     Temp 06/11/20 1010 97.7 F (36.5 C)     Temp src --      SpO2 06/11/20 1010 98 %     Weight 06/11/20 1011 151 lb 14.4 oz (68.9 kg)     Height 06/11/20 1011 5\' 6"  (1.676 m)     Head Circumference --      Peak Flow --      Pain Score 06/11/20 1011 5     Pain Loc --  Pain Edu? --      Excl. in [place]? --     Constitutional: Alert and oriented.  Relatively well appearing and in no acute distress. Eyes: Conjunctivae are normal.  Head: Atraumatic. Nose: No congestion/rhinnorhea. Mouth/Throat: Mucous membranes are moist.   Neck: Normal range of motion.  Cardiovascular: Normal rate, regular rhythm. Grossly normal heart sounds.  Good peripheral circulation. Respiratory: Normal respiratory effort.  No retractions.  Bilateral faint rales. Gastrointestinal: Soft and nontender. No distention.  Genitourinary: No flank tenderness. Musculoskeletal: No lower extremity edema.  Extremities warm and well perfused.  Neurologic:  Normal speech and language.  Motor intact in all extremities.  No ataxia. Skin:  Skin is warm and dry. No rash noted. Psychiatric: Mood and affect are normal. Speech and behavior are normal.  ____________________________________________   LABS (all labs ordered are listed, but only abnormal results are displayed)  Labs Reviewed  BASIC METABOLIC PANEL - Abnormal; Notable for the following components:      Result Value   Glucose, Bld 101 (*)    BUN 32 (*)    Creatinine, Ser 1.75 (*)     GFR calc non Af Amer 33 (*)    GFR calc Af Amer 39 (*)    All other components within normal limits  CBC - Abnormal; Notable for the following components:   RBC 4.14 (*)    Hemoglobin 11.9 (*)    HCT 37.0 (*)    RDW 15.9 (*)    All other components within normal limits  URINALYSIS, COMPLETE (UACMP) WITH MICROSCOPIC - Abnormal; Notable for the following components:   Color, Urine YELLOW (*)    APPearance CLEAR (*)    All other components within normal limits  BLOOD GAS, VENOUS - Abnormal; Notable for the following components:   pCO2, Ven 43 (*)    All other components within normal limits  TSH  TROPONIN I (HIGH SENSITIVITY)   ____________________________________________  EKG  ED ECG REPORT I, Arta Silence, the attending physician, personally viewed and interpreted this ECG.  Date: 06/11/2020 EKG Time: 1008 Rate: 71 Rhythm: normal sinus rhythm QRS Axis: Borderline left axis Intervals: Borderline PR ST/T Wave abnormalities: normal Narrative Interpretation: no evidence of acute ischemia  ____________________________________________  RADIOLOGY    ____________________________________________   PROCEDURES  Procedure(s) performed: No  Procedures  Critical Care performed: No ____________________________________________   INITIAL IMPRESSION / ASSESSMENT AND PLAN / ED COURSE  Pertinent labs & imaging results that were available during my care of the patient were reviewed by me and considered in my medical decision making (see chart for details).  86 year old male with PMH as noted above including COPD and pulmonary fibrosis on home O2 presents with worsening generalized weakness over the last several days, with no focal neurologic symptoms.  He also has some increased shortness of breath and cough.  I reviewed the past medical records in [place].  The patient was most recently seen in the ED last January with increased cough and shortness of breath, and was  discharged.  He was evaluated by Dr. Clayborn Bigness from cardiology yesterday and his amiodarone dose was decreased by half.  On exam today, the patient is overall well-appearing for his age.  His vital signs are normal except for very mild hypertension.  Physical exam is otherwise unremarkable.  Neurologic exam is nonfocal.  He does have some rales on lung exam but I suspect that this is chronic and due to his fibrosis.  Differential includes COPD/fibrosis exacerbation, hypercapnia, medication side effect,  UTI or other infection.  I have a low suspicion for cardiac etiology.  Basic labs obtained from triage are consistent with the patient's baseline.  I have added on a TSH, VBG, UA, and chest x-ray.  ----------------------------------------- 3:21 PM on 06/11/2020 -----------------------------------------  The additional work-up is negative.  The TSH, urinalysis, chest x-ray showed no acute findings, and the blood gas is also reassuring with a PCO2 of 43.  On reassessment, I counseled the patient on the results of the work-up and he appeared reassured by these findings.  At this time, there is no indication for admission.  We did a trial of ambulation.  The patient was able to get up and states he felt slightly wobbly, but felt well to go home.  He was given a walker for stability.  I gave him thorough return precautions and he expressed understanding.  He agrees to follow-up with his primary care doctor. ____________________________________________   FINAL CLINICAL IMPRESSION(S) / ED DIAGNOSES  Final diagnoses:  Generalized weakness      NEW MEDICATIONS STARTED DURING THIS VISIT:  New Prescriptions   No medications on file     Note:  This document was prepared using Dragon voice recognition software and may include unintentional dictation errors.    Arta Silence, MD 06/11/20 [phone]

## 2020-06-11 NOTE — Discharge Instructions (Addendum)
Follow-up with your primary care doctor and cardiologist.  Return to the ER for new, worsening, or persistent weakness, lightheadedness, inability to walk, shortness of breath, chest pain, or any other new or worsening symptoms that concern you.

## 2020-06-11 NOTE — ED Triage Notes (Signed)
Pt arrived via [facility] from [facility]. Pt c/o Weakness and dizziness for past 3 days. Pt amidarone dose recently cut in half  by cardiologist. Left sided headache as well. cbg 108. [place] screen negative per ems. Pt  wears 2L at home as needed,  hx of copd. Pt currently alert and oriented x4 at this time.

## 2020-06-11 NOTE — ED Notes (Addendum)
Pt ambulated in room. Pt with slight unsteadiness with gait. MD Siadecki aware. Pt instructed to use walker at home while pt is regaining strength. D/C instructions discussed with patient and sister with patient permission. No further questions at this time. Pt in NAD and wheeled to lobby to wait on [facility] transportation at this time.

## 2020-06-24 ENCOUNTER — Telehealth: Payer: Self-pay | Admitting: Adult Health Nurse Practitioner

## 2020-06-24 NOTE — Telephone Encounter (Signed)
Spoke with patient and scheduled appointment for tomorrow at 12:30 Dalan Cowger K. Olena Heckle NP

## 2020-06-25 ENCOUNTER — Other Ambulatory Visit: Payer: Self-pay

## 2020-06-25 ENCOUNTER — Non-Acute Institutional Stay: Payer: Medicare Other | Admitting: Adult Health Nurse Practitioner

## 2020-06-25 DIAGNOSIS — J84112 Idiopathic pulmonary fibrosis: Secondary | ICD-10-CM

## 2020-06-25 DIAGNOSIS — Z515 Encounter for palliative care: Secondary | ICD-10-CM

## 2020-06-25 NOTE — Progress Notes (Signed)
Wesley Newton: [phone]  Fax: [phone]  PATIENT NAME: Wesley Newton DOB: 06-14-1927 MRN: [id number]  PRIMARY CARE PROVIDER:   Baxter Hire, MD  REFERRING PROVIDER:  Baxter Hire, MD [place],  [place] [address]  RESPONSIBLE PARTY:   Granddaughter Wesley Newton-HCPOA          RECOMMENDATIONS and PLAN:  1.  Advanced care planning. Patient is a DNR  2.  Pulmonary fibrosis/COPD.  Patient around the end of March early April of this year had exacerbation of his pulmonary fibrosis and was started on Ceftin and prednisone taper.  In May was seen by his pulmonologist.  His FEV1 is 2.68 154% of predicted.  Patient having progression of his pulmonary fibrosis but is refusing Esbriet.  Was started on supplemental oxygen at 2 L.  Patient states that he uses oxygen continuous at night and only as needed throughout the day.  He does state that when he is being active such as working in the kitchen that he will get short of breath and will noticed that his O2 will drop into the 80s and that is when he will put his oxygen on.  Patient does state that he now uses a walker when walking longer distances.  Does have a portable oxygen machine that he takes when he leaves his home.  We further discussed disease progression and ways to conserve energy.  Have also suggested that when being more active to keep his oxygen on and then taking the oxygen off when he is resting.  Patient did have an episode of weakness and was evaluated in the ER on 06/11/2020.  After this Education officer, museum at [facility] helped him to have time at the health care center where he stayed 5 days.  Patient states that he has been going to the gym area at the [employer] [place] near the swimming pool.  States that there is a pacific machine that he uses there but is unsure what it is called.  States that he can use it for 30 minutes at a time.  States that he  is feeling stronger since he has been doing this.  Continue follow-up and recommendations by pulmonology.  3.  Appetite.  Patient continues to endorse that he has no appetite but makes himself eat.  He is getting enough to maintain his weight at around 151 pounds.  Did discuss that as the lungs have to work harder the body uses more calories and have encouraged him to monitor his weight and to add supplements if needed.  4.  Support.  Patient states that he has started having someone come in and clean for him every couple of weeks.  We did discuss today the possibility of placement in ALF.  States that he had his great granddaughter have discussed this.  Also discussed the possibility of having help in the home as disease progresses.  Have encouraged him to write a list of pros and cons for going into ALF.  This will be an ongoing discussion and will reach out to social worker at [facility] to let her know that we are discussing this so that she can help with any decision making or resources.  I spent 80 minutes providing this consultation,  from 12:30 to 1:50 including time spent with patient, chart review, provider coordination, documentation. More than 50% of the time in this consultation was spent coordinating communication.   HISTORY OF PRESENT  ILLNESS:  Wesley Newton is a 86 y.o. year old male with multiple medical problems including COPD, pulmonary emphysema, chronic cough, dyspnea on exertion, asthmatic bronchitis, diastolic heart failure, hypertension, hypercholesterolemia, gerd, rhinitis, anemia, BPH, lumbago. Palliative Care was asked to help address goals of care.   CODE STATUS: DNR  PPS: 50% HOSPICE ELIGIBILITY/DIAGNOSIS: TBD  PHYSICAL EXAM:  BP 122/58  HR  71  O2 98% o RA General: NAD, frail appearing, thin Cardiovascular: regular rate and rhythm Pulmonary: rales heard more in bases more likely due to fibrosis; normal respiratory effort Abdomen: soft, nontender, + bowel  sounds GU: no suprapubic tenderness Extremities: trace edema, arthritic changes to fingers Skin: no rashes on exposed skin Neurological: Weakness but otherwise nonfocal   PAST MEDICAL HISTORY:  Past Medical History:  Diagnosis Date   Asthma    BPH (benign prostatic hyperplasia)    Bronchitis    Cancer (HCC)    COPD (chronic obstructive pulmonary disease) (HCC)    DVT of leg (deep venous thrombosis) (HCC)    Elevated PSA    Emphysema lung (HCC)    Emphysema of lung (HCC)    Hyperlipidemia    Nocturia    Pulmonary fibrosis (HCC)    Pulmonary fibrosis (HCC)    Skin cancer     SOCIAL HX:  Social History   Tobacco Use   Smoking status: Former Smoker   Smokeless tobacco: Never Used   Tobacco comment: quit 40 years  Substance Use Topics   Alcohol use: No    Alcohol/week: 0.0 standard drinks    ALLERGIES:  Allergies  Allergen Reactions   Amoxicillin Other (See Comments)    DID THE REACTION INVOLVE: Swelling of the face/tongue/throat, SOB, or low BP? Unknown Sudden or severe rash/hives, skin peeling, or the inside of the mouth or nose? Unknown Did it require medical treatment? Unknown When did it last happen?Unknown If all above answers are NO, may proceed with cephalosporin use.    Codeine Nausea And Vomiting   Rapaflo [Silodosin] Other (See Comments)    Reaction:  Unknown    Tramadol Nausea And Vomiting and Other (See Comments)    Pt states that this medication makes him feel crazy.     Trimethoprim Other (See Comments)    Reaction: unknown     PERTINENT MEDICATIONS:  Outpatient Encounter Medications as of 06/25/2020  Medication Sig   albuterol (PROVENTIL HFA;VENTOLIN HFA) 108 (90 Base) MCG/ACT inhaler Inhale 2 puffs into the lungs every 6 (six) hours as needed for wheezing or shortness of breath.   amiodarone (PACERONE) 200 MG tablet Take 1 tablet (200 mg total) by mouth 2 (two) times daily. (Patient taking differently: Take 100 mg by  mouth daily. )   apixaban (ELIQUIS) 2.5 MG TABS tablet Take 1 tablet (2.5 mg total) by mouth 2 (two) times daily.   atorvastatin (LIPITOR) 20 MG tablet Take 1 tablet (20 mg total) by mouth daily at 6 PM.   azelastine (ASTELIN) 0.1 % nasal spray Place 1 spray into both nostrils daily.   budesonide (PULMICORT) 0.5 MG/2ML nebulizer solution Take 0.5 mg by nebulization daily.   dextromethorphan-guaiFENesin (MUCINEX DM) 30-600 MG 12hr tablet Take 1 tablet by mouth 2 (two) times daily.   feeding supplement, ENSURE ENLIVE, (ENSURE ENLIVE) LIQD Take 237 mLs by mouth 2 (two) times daily between meals.   finasteride (PROSCAR) 5 MG tablet Take 1 tablet (5 mg total) by mouth daily.   gabapentin (NEURONTIN) 100 MG capsule Take 100-200 mg by mouth  2 (two) times daily. Take 2capsules (200mg ) by mouth every night and 1 capsule (100mg ) by mouth every morning.   guaiFENesin (MUCINEX) 600 MG 12 hr tablet Take 1 tablet (600 mg total) by mouth 2 (two) times daily.   ipratropium-albuterol (DUONEB) 0.5-2.5 (3) MG/3ML SOLN Inhale 3 mLs into the lungs 4 (four) times daily.   LORazepam (ATIVAN) 0.5 MG tablet Take 1 tablet (0.5 mg total) by mouth daily as needed for anxiety.   midodrine (PROAMATINE) 10 MG tablet Take 1 tablet (10 mg total) by mouth 3 (three) times daily with meals.   Multiple Vitamins-Minerals (OCUVITE ADULT 50+ PO) Take 1 capsule by mouth daily.   naphazoline-glycerin (CLEAR EYES REDNESS) 0.012-0.2 % SOLN Place 1-2 drops into both eyes 4 (four) times daily as needed for eye irritation.   pantoprazole (PROTONIX) 40 MG tablet Take 1 tablet (40 mg total) by mouth 2 (two) times daily.   tamsulosin (FLOMAX) 0.4 MG CAPS capsule TAKE 2 CAPSULES BY MOUTH EVERY DAY (Patient taking differently: Take 0.4 mg by mouth daily after supper. )   No facility-administered encounter medications on file as of 06/25/2020.     Harjit Leider Jenetta Downer, NP

## 2020-08-03 ENCOUNTER — Other Ambulatory Visit: Payer: Self-pay

## 2020-08-03 ENCOUNTER — Other Ambulatory Visit: Payer: Medicare Other | Admitting: Adult Health Nurse Practitioner

## 2020-08-03 DIAGNOSIS — Z515 Encounter for palliative care: Secondary | ICD-10-CM

## 2020-08-03 DIAGNOSIS — J84112 Idiopathic pulmonary fibrosis: Secondary | ICD-10-CM

## 2020-08-03 NOTE — Progress Notes (Signed)
[facility] Consult Note Telephone: [phone]  Fax: [phone]  PATIENT NAME: Wesley Newton DOB: [birth year] MRN: [id number]  PRIMARY CARE PROVIDER:   Baxter Hire, MD  REFERRING PROVIDER:  Baxter Hire, MD [place],  [place] [address]  RESPONSIBLE PARTY:   Granddaughter Audra-HCPOA       RECOMMENDATIONS and PLAN:  1.  Advanced care planning. Patient is a DNR  2.  Pulmonary fibrosis/COPD.  His FEV1 is 2.68 154% of predicted.  Patient having progression of his pulmonary fibrosis but is refusing Esbriet.  Was started on supplemental oxygen at 2 L.  Patient states that he uses oxygen continuous at night and only as needed throughout the day.  He uses a walker when walking longer distances.  Does have a portable oxygen machine that he takes when he leaves his home. Does state that he gets tired more quickly than he used to.  Discussed this is part of the disease progression. Patient has home health PT through [facility] coming in.  States that they are helping him with ways to conserve energy and checking if there is any DME he may need in the home.  Continue PT as ordered and follow recommendations.  Continue follow up and recommendations by pulmonology  3.  Functional status. Patient is still very active and is able to go up and down a short flight of steps.  Does have DOE with activity.  Does have someone help come in and clean monthly.  Is still able to straighten up apartment and cook for himself.  Independent of ADLs.  Continent of B&B.  Denies falls.    4.  Nutritional status.  Patient weight stable at 151. Eats well but still has to encourage himself to eat. This is baseline.  Continue to monitor weight.  5.  Support.  Discussed AL again.  States that it is too expensive right now and wants to transition to ALF once he has too.  Right now he is very independent and does get help from family.  Have encouraged  possibly adding in home help if he needed it in the future prior to transitioning to ALF.  Will continue to monitor for need for AL at each visit  Denies increased SOB or cough, chest pain, N/V/D, constipation, dysuria, hematuria, hematochezia, headaches, dizziness.  Does have arthritis pain that is controlled with Tylenol.  Palliative will continue to monitor for symptom management/decline and make recommendations as needed. Next appointment is in 6 weeks.  Encouraged to call with any questions.    I spent 40 minutes providing this consultation,  from 12:30 to 1:10 including time spent with patient, chart review, provider coordination, documentation. More than 50% of the time in this consultation was spent coordinating communication.   HISTORY OF PRESENT ILLNESS:  Wesley Newton is a 86 y.o. year old male with multiple medical problems including COPD, pulmonary emphysema, chronic cough, dyspnea on exertion, asthmatic bronchitis, diastolic heart failure, hypertension, hypercholesterolemia, gerd, rhinitis, anemia, BPH, lumbago. Palliative Care was asked to help address goals of care.   CODE STATUS: DNR  PPS: 50% HOSPICE ELIGIBILITY/DIAGNOSIS: TBD  PHYSICAL EXAM:  BP 120/58  HR  84  O2 94% o RA General: NAD, frail appearing, thin Cardiovascular: regular rate and rhythm Pulmonary: rales heard more in bases more likely due to fibrosis, some wheezing heard; normal respiratory effort Abdomen: soft, nontender, + bowel sounds GU: no suprapubic tenderness Extremities: trace edema,arthritic changes to  fingers Skin: no rashes on exposed skin Neurological: Weakness but otherwise nonfocal  PAST MEDICAL HISTORY:  Past Medical History:  Diagnosis Date   Asthma    BPH (benign prostatic hyperplasia)    Bronchitis    Cancer (HCC)    COPD (chronic obstructive pulmonary disease) (HCC)    DVT of leg (deep venous thrombosis) (HCC)    Elevated PSA    Emphysema lung (HCC)    Emphysema of lung  (HCC)    Hyperlipidemia    Nocturia    Pulmonary fibrosis (HCC)    Pulmonary fibrosis (HCC)    Skin cancer     SOCIAL HX:  Social History   Tobacco Use   Smoking status: Former Smoker   Smokeless tobacco: Never Used   Tobacco comment: quit 40 years  Substance Use Topics   Alcohol use: No    Alcohol/week: 0.0 standard drinks    ALLERGIES:  Allergies  Allergen Reactions   Amoxicillin Other (See Comments)    DID THE REACTION INVOLVE: Swelling of the face/tongue/throat, SOB, or low BP? Unknown Sudden or severe rash/hives, skin peeling, or the inside of the mouth or nose? Unknown Did it require medical treatment? Unknown When did it last happen?Unknown If all above answers are NO, may proceed with cephalosporin use.    Codeine Nausea And Vomiting   Rapaflo [Silodosin] Other (See Comments)    Reaction:  Unknown    Tramadol Nausea And Vomiting and Other (See Comments)    Pt states that this medication makes him feel crazy.     Trimethoprim Other (See Comments)    Reaction: unknown     PERTINENT MEDICATIONS:  Outpatient Encounter Medications as of 08/03/2020  Medication Sig   albuterol (PROVENTIL HFA;VENTOLIN HFA) 108 (90 Base) MCG/ACT inhaler Inhale 2 puffs into the lungs every 6 (six) hours as needed for wheezing or shortness of breath.   amiodarone (PACERONE) 200 MG tablet Take 1 tablet (200 mg total) by mouth 2 (two) times daily. (Patient taking differently: Take 100 mg by mouth daily. )   apixaban (ELIQUIS) 2.5 MG TABS tablet Take 1 tablet (2.5 mg total) by mouth 2 (two) times daily.   atorvastatin (LIPITOR) 20 MG tablet Take 1 tablet (20 mg total) by mouth daily at 6 PM.   azelastine (ASTELIN) 0.1 % nasal spray Place 1 spray into both nostrils daily.   budesonide (PULMICORT) 0.5 MG/2ML nebulizer solution Take 0.5 mg by nebulization daily.   dextromethorphan-guaiFENesin (MUCINEX DM) 30-600 MG 12hr tablet Take 1 tablet by mouth 2 (two) times daily.    feeding supplement, ENSURE ENLIVE, (ENSURE ENLIVE) LIQD Take 237 mLs by mouth 2 (two) times daily between meals.   finasteride (PROSCAR) 5 MG tablet Take 1 tablet (5 mg total) by mouth daily.   gabapentin (NEURONTIN) 100 MG capsule Take 100-200 mg by mouth 2 (two) times daily. Take 2capsules (200mg ) by mouth every night and 1 capsule (100mg ) by mouth every morning.   guaiFENesin (MUCINEX) 600 MG 12 hr tablet Take 1 tablet (600 mg total) by mouth 2 (two) times daily.   ipratropium-albuterol (DUONEB) 0.5-2.5 (3) MG/3ML SOLN Inhale 3 mLs into the lungs 4 (four) times daily.   LORazepam (ATIVAN) 0.5 MG tablet Take 1 tablet (0.5 mg total) by mouth daily as needed for anxiety.   midodrine (PROAMATINE) 10 MG tablet Take 1 tablet (10 mg total) by mouth 3 (three) times daily with meals.   Multiple Vitamins-Minerals (OCUVITE ADULT 50+ PO) Take 1 capsule by mouth daily.  naphazoline-glycerin (CLEAR EYES REDNESS) 0.012-0.2 % SOLN Place 1-2 drops into both eyes 4 (four) times daily as needed for eye irritation.   pantoprazole (PROTONIX) 40 MG tablet Take 1 tablet (40 mg total) by mouth 2 (two) times daily.   tamsulosin (FLOMAX) 0.4 MG CAPS capsule TAKE 2 CAPSULES BY MOUTH EVERY DAY (Patient taking differently: Take 0.4 mg by mouth daily after supper. )   No facility-administered encounter medications on file as of 08/03/2020.     Varnell Donate Jenetta Downer, NP

## 2020-08-05 ENCOUNTER — Other Ambulatory Visit: Payer: Self-pay

## 2020-08-05 ENCOUNTER — Emergency Department
Admission: EM | Admit: 2020-08-05 | Discharge: 2020-08-05 | Disposition: A | Discharge status: Home / Self Care | Payer: Medicare Other | Attending: Emergency Medicine | Admitting: Emergency Medicine

## 2020-08-05 ENCOUNTER — Emergency Department: Payer: Medicare Other

## 2020-08-05 DIAGNOSIS — R0789 Other chest pain: Secondary | ICD-10-CM | POA: Insufficient documentation

## 2020-08-05 DIAGNOSIS — Z5321 Procedure and treatment not carried out due to patient leaving prior to being seen by health care provider: Secondary | ICD-10-CM | POA: Insufficient documentation

## 2020-08-05 DIAGNOSIS — Z8701 Personal history of pneumonia (recurrent): Secondary | ICD-10-CM | POA: Diagnosis not present

## 2020-08-05 DIAGNOSIS — R0602 Shortness of breath: Secondary | ICD-10-CM | POA: Insufficient documentation

## 2020-08-05 LAB — TROPONIN I (HIGH SENSITIVITY)
Troponin I (High Sensitivity): 8 ng/L (ref <18)
Troponin I (High Sensitivity): 11 ng/L (ref <18)

## 2020-08-05 LAB — BASIC METABOLIC PANEL
Anion gap: 10 (ref 5–15)
BUN: 30 mg/dL — ABNORMAL HIGH (ref 8–23)
CO2: 27 mmol/L (ref 22–32)
Calcium: 9.5 mg/dL (ref 8.9–10.3)
Chloride: 99 mmol/L (ref 98–111)
Creatinine, Ser: 0.49 mg/dL — ABNORMAL LOW (ref 0.61–1.24)
GFR calc Af Amer: >60 mL/min (ref >60)
GFR calc non Af Amer: >60 mL/min (ref >60)
Glucose, Bld: 83 mg/dL (ref 70–99)
Potassium: 4.3 mmol/L (ref 3.5–5.1)
Sodium: 136 mmol/L (ref 135–145)

## 2020-08-05 LAB — CBC
HCT: 34.5 % — ABNORMAL LOW (ref 39.0–52.0)
Hemoglobin: 11.5 g/dL — ABNORMAL LOW (ref 13.0–17.0)
MCH: 29.4 pg (ref 26.0–34.0)
MCHC: 33.3 g/dL (ref 30.0–36.0)
MCV: 88.2 fL (ref 80.0–100.0)
Platelets: 278 10*3/uL (ref 150–400)
RBC: 3.91 MIL/uL — ABNORMAL LOW (ref 4.22–5.81)
RDW: 15.9 % — ABNORMAL HIGH (ref 11.5–15.5)
WBC: 11.7 10*3/uL — ABNORMAL HIGH (ref 4.0–10.5)
nRBC: 0 % (ref 0.0–0.2)

## 2020-08-05 NOTE — ED Notes (Signed)
Repeat troponin collected and sent to lab.

## 2020-08-05 NOTE — ED Triage Notes (Signed)
Pt in via EMS from home with c/o intermittent cp that started a few hours ago. Pt feels relief now, was 6/10. Justus Memory is non radiating. 12-lead unremarkable. 140/84, HR 62, 100% RA

## 2020-08-05 NOTE — ED Triage Notes (Addendum)
See first nurse note. PT from [facility] c/o central CP that started upon awakening. Pt states pain has subsided at this time. Pt reports shob also. Pt speaking with this RN in complete sentences without shob, skin warm and dry, NAD. Pt reports he had pneumonia 3 weeks ago

## 2020-08-06 ENCOUNTER — Encounter (INDEPENDENT_AMBULATORY_CARE_PROVIDER_SITE_OTHER): Payer: Medicare Other

## 2020-08-06 ENCOUNTER — Ambulatory Visit (INDEPENDENT_AMBULATORY_CARE_PROVIDER_SITE_OTHER): Payer: Medicare Other | Admitting: Nurse Practitioner

## 2020-08-11 ENCOUNTER — Telehealth: Payer: Self-pay | Admitting: Emergency Medicine

## 2020-09-28 ENCOUNTER — Other Ambulatory Visit: Payer: Medicare Other | Admitting: Adult Health Nurse Practitioner

## 2020-09-28 ENCOUNTER — Other Ambulatory Visit: Payer: Self-pay

## 2020-09-28 DIAGNOSIS — J84112 Idiopathic pulmonary fibrosis: Secondary | ICD-10-CM

## 2020-09-28 DIAGNOSIS — Z515 Encounter for palliative care: Secondary | ICD-10-CM

## 2020-09-28 NOTE — Progress Notes (Signed)
[facility] Consult Note Telephone: [phone]  Fax: [phone]  PATIENT NAME: Wesley Newton DOB: [birth year] MRN: [id number]  PRIMARY CARE PROVIDER:   Baxter Hire, MD  REFERRING PROVIDER:  Baxter Hire, MD [place],  [place] [address]  RESPONSIBLE PARTY:   Granddaughter Audra-HCPOA   RECOMMENDATIONS and PLAN: 1.Advanced care planning. Patient is a DNR  2.  Functional status. Patient is still very active and is able to go up and down a short flight of steps.  Does have DOE with activity.  Does have someone help come in and clean monthly.  Is still able to straighten up apartment and cook for himself.  Independent of ADLs.  Continent of B&B.  Denies falls.   3.   Nutritional status.  Patient weight stable at 153. Eats well but still has to encourage himself to eat. This is baseline.  Continue to monitor weight.  4.  Support.  Continued to discuss AL.  He states that he is doing well in his apartment and will considering getting additional in home help when needed.  Will continue to monitor and discuss with patient when he may need more help in home.    Palliative will continue to monitor for symptom management/decline and make recommendations as needed. Next appointment is in 4 weeks.  Encouraged to call with any questions or concerns.  I spent 50 minutes providing this consultation,  from 11:10 to 12:00 including time spent with patient/family, chart review, provider coordination, documentation. More than 50% of the time in this consultation was spent coordinating communication.   HISTORY OF PRESENT ILLNESS:  ALDER MURRI is a 86 y.o. year old male with multiple medical problems including COPD, pulmonary emphysema, chronic cough, dyspnea on exertion, asthmatic bronchitis, diastolic heart failure, hypertension, hypercholesterolemia, gerd, rhinitis, anemia, BPH, lumbago. Palliative Care was asked  to help address goals of care. Patient was evaluated at ED on 08/05/20 for chest pain.  Was told that it was acid reflux.  States that he takes prilosec PRN when the pain hits with relief.  Occasional constipation with relief when he takes prune juice.  States that he had increased edema about a week ago which has gone down on its own.  He does get worn easier. Uses O2 @2L  at night and PRN throughout the day. Denies increased SOB or cough, headaches, dizziness, N/V/D, dysuria, hematuria, fever.    CODE STATUS: DNR  PPS: 50% HOSPICE ELIGIBILITY/DIAGNOSIS: TBD  PHYSICAL EXAM:  BP  118/62  HR 68  O2 97% on 2L General: NAD, frail appearing, thin Cardiovascular: regular rate and rhythm Pulmonary:rales heard more in bases more likely due to fibrosis, some wheezing heard; normal respiratory effort Abdomen: soft, nontender, + bowel sounds GU: no suprapubic tenderness Extremities:traceedema,arthritic changes to fingers Skin: no rasheson exposed skin Neurological: Weakness but otherwise nonfocal   PAST MEDICAL HISTORY:  Past Medical History:  Diagnosis Date   Asthma    BPH (benign prostatic hyperplasia)    Bronchitis    Cancer (HCC)    COPD (chronic obstructive pulmonary disease) (HCC)    DVT of leg (deep venous thrombosis) (HCC)    Elevated PSA    Emphysema lung (HCC)    Emphysema of lung (HCC)    Hyperlipidemia    Nocturia    Pulmonary fibrosis (HCC)    Pulmonary fibrosis (HCC)    Skin cancer     SOCIAL HX:  Social History   Tobacco Use  Smoking status: Former Smoker   Smokeless tobacco: Never Used   Tobacco comment: quit 40 years  Substance Use Topics   Alcohol use: No    Alcohol/week: 0.0 standard drinks    ALLERGIES:  Allergies  Allergen Reactions   Amoxicillin Other (See Comments)    DID THE REACTION INVOLVE: Swelling of the face/tongue/throat, SOB, or low BP? Unknown Sudden or severe rash/hives, skin peeling, or the inside of the mouth or  nose? Unknown Did it require medical treatment? Unknown When did it last happen?Unknown If all above answers are NO, may proceed with cephalosporin use.    Codeine Nausea And Vomiting   Rapaflo [Silodosin] Other (See Comments)    Reaction:  Unknown    Tramadol Nausea And Vomiting and Other (See Comments)    Pt states that this medication makes him feel crazy.     Trimethoprim Other (See Comments)    Reaction: unknown     PERTINENT MEDICATIONS:  Outpatient Encounter Medications as of 09/28/2020  Medication Sig   albuterol (PROVENTIL HFA;VENTOLIN HFA) 108 (90 Base) MCG/ACT inhaler Inhale 2 puffs into the lungs every 6 (six) hours as needed for wheezing or shortness of breath.   amiodarone (PACERONE) 200 MG tablet Take 1 tablet (200 mg total) by mouth 2 (two) times daily. (Patient taking differently: Take 100 mg by mouth daily. )   apixaban (ELIQUIS) 2.5 MG TABS tablet Take 1 tablet (2.5 mg total) by mouth 2 (two) times daily.   atorvastatin (LIPITOR) 20 MG tablet Take 1 tablet (20 mg total) by mouth daily at 6 PM.   azelastine (ASTELIN) 0.1 % nasal spray Place 1 spray into both nostrils daily.   budesonide (PULMICORT) 0.5 MG/2ML nebulizer solution Take 0.5 mg by nebulization daily.   dextromethorphan-guaiFENesin (MUCINEX DM) 30-600 MG 12hr tablet Take 1 tablet by mouth 2 (two) times daily.   feeding supplement, ENSURE ENLIVE, (ENSURE ENLIVE) LIQD Take 237 mLs by mouth 2 (two) times daily between meals.   finasteride (PROSCAR) 5 MG tablet Take 1 tablet (5 mg total) by mouth daily.   gabapentin (NEURONTIN) 100 MG capsule Take 100-200 mg by mouth 2 (two) times daily. Take 2capsules (200mg ) by mouth every night and 1 capsule (100mg ) by mouth every morning.   guaiFENesin (MUCINEX) 600 MG 12 hr tablet Take 1 tablet (600 mg total) by mouth 2 (two) times daily.   ipratropium-albuterol (DUONEB) 0.5-2.5 (3) MG/3ML SOLN Inhale 3 mLs into the lungs 4 (four) times daily.   LORazepam  (ATIVAN) 0.5 MG tablet Take 1 tablet (0.5 mg total) by mouth daily as needed for anxiety.   midodrine (PROAMATINE) 10 MG tablet Take 1 tablet (10 mg total) by mouth 3 (three) times daily with meals.   Multiple Vitamins-Minerals (OCUVITE ADULT 50+ PO) Take 1 capsule by mouth daily.   naphazoline-glycerin (CLEAR EYES REDNESS) 0.012-0.2 % SOLN Place 1-2 drops into both eyes 4 (four) times daily as needed for eye irritation.   pantoprazole (PROTONIX) 40 MG tablet Take 1 tablet (40 mg total) by mouth 2 (two) times daily.   tamsulosin (FLOMAX) 0.4 MG CAPS capsule TAKE 2 CAPSULES BY MOUTH EVERY DAY (Patient taking differently: Take 0.4 mg by mouth daily after supper. )   No facility-administered encounter medications on file as of 09/28/2020.    Soleia Badolato Jenetta Downer, NP

## 2020-10-04 ENCOUNTER — Emergency Department: Payer: Medicare Other

## 2020-10-04 ENCOUNTER — Inpatient Hospital Stay
Admission: EM | Admit: 2020-10-04 | Discharge: 2020-10-09 | DRG: 190 | Disposition: A | Discharge status: Skilled Nursing Facility | Payer: Medicare Other | Attending: Internal Medicine | Admitting: Internal Medicine

## 2020-10-04 ENCOUNTER — Other Ambulatory Visit: Payer: Self-pay

## 2020-10-04 DIAGNOSIS — Y848 Other medical procedures as the cause of abnormal reaction of the patient, or of later complication, without mention of misadventure at the time of the procedure: Secondary | ICD-10-CM | POA: Diagnosis present

## 2020-10-04 DIAGNOSIS — J441 Chronic obstructive pulmonary disease with (acute) exacerbation: Secondary | ICD-10-CM | POA: Diagnosis not present

## 2020-10-04 DIAGNOSIS — J9601 Acute respiratory failure with hypoxia: Secondary | ICD-10-CM | POA: Diagnosis present

## 2020-10-04 DIAGNOSIS — N1831 Chronic kidney disease, stage 3a: Secondary | ICD-10-CM | POA: Diagnosis present

## 2020-10-04 DIAGNOSIS — Z515 Encounter for palliative care: Secondary | ICD-10-CM

## 2020-10-04 DIAGNOSIS — N179 Acute kidney failure, unspecified: Secondary | ICD-10-CM | POA: Diagnosis not present

## 2020-10-04 DIAGNOSIS — Z885 Allergy status to narcotic agent status: Secondary | ICD-10-CM

## 2020-10-04 DIAGNOSIS — I480 Paroxysmal atrial fibrillation: Secondary | ICD-10-CM | POA: Diagnosis present

## 2020-10-04 DIAGNOSIS — I248 Other forms of acute ischemic heart disease: Secondary | ICD-10-CM | POA: Diagnosis present

## 2020-10-04 DIAGNOSIS — R1310 Dysphagia, unspecified: Secondary | ICD-10-CM | POA: Diagnosis present

## 2020-10-04 DIAGNOSIS — J9620 Acute and chronic respiratory failure, unspecified whether with hypoxia or hypercapnia: Secondary | ICD-10-CM | POA: Diagnosis present

## 2020-10-04 DIAGNOSIS — I739 Peripheral vascular disease, unspecified: Secondary | ICD-10-CM | POA: Diagnosis present

## 2020-10-04 DIAGNOSIS — Z79899 Other long term (current) drug therapy: Secondary | ICD-10-CM

## 2020-10-04 DIAGNOSIS — J84112 Idiopathic pulmonary fibrosis: Secondary | ICD-10-CM | POA: Diagnosis present

## 2020-10-04 DIAGNOSIS — R0902 Hypoxemia: Secondary | ICD-10-CM

## 2020-10-04 DIAGNOSIS — Z9849 Cataract extraction status, unspecified eye: Secondary | ICD-10-CM

## 2020-10-04 DIAGNOSIS — I10 Essential (primary) hypertension: Secondary | ICD-10-CM | POA: Diagnosis present

## 2020-10-04 DIAGNOSIS — Z85828 Personal history of other malignant neoplasm of skin: Secondary | ICD-10-CM

## 2020-10-04 DIAGNOSIS — R0602 Shortness of breath: Secondary | ICD-10-CM

## 2020-10-04 DIAGNOSIS — K219 Gastro-esophageal reflux disease without esophagitis: Secondary | ICD-10-CM | POA: Diagnosis not present

## 2020-10-04 DIAGNOSIS — Z8701 Personal history of pneumonia (recurrent): Secondary | ICD-10-CM

## 2020-10-04 DIAGNOSIS — E785 Hyperlipidemia, unspecified: Secondary | ICD-10-CM | POA: Diagnosis present

## 2020-10-04 DIAGNOSIS — M9689 Other intraoperative and postprocedural complications and disorders of the musculoskeletal system: Secondary | ICD-10-CM | POA: Diagnosis present

## 2020-10-04 DIAGNOSIS — J449 Chronic obstructive pulmonary disease, unspecified: Secondary | ICD-10-CM | POA: Diagnosis not present

## 2020-10-04 DIAGNOSIS — Z66 Do not resuscitate: Secondary | ICD-10-CM | POA: Diagnosis present

## 2020-10-04 DIAGNOSIS — R0603 Acute respiratory distress: Secondary | ICD-10-CM | POA: Diagnosis not present

## 2020-10-04 DIAGNOSIS — Z87891 Personal history of nicotine dependence: Secondary | ICD-10-CM

## 2020-10-04 DIAGNOSIS — Y92239 Unspecified place in hospital as the place of occurrence of the external cause: Secondary | ICD-10-CM | POA: Diagnosis present

## 2020-10-04 DIAGNOSIS — I9589 Other hypotension: Secondary | ICD-10-CM | POA: Diagnosis present

## 2020-10-04 DIAGNOSIS — I130 Hypertensive heart and chronic kidney disease with heart failure and stage 1 through stage 4 chronic kidney disease, or unspecified chronic kidney disease: Secondary | ICD-10-CM | POA: Diagnosis present

## 2020-10-04 DIAGNOSIS — J9621 Acute and chronic respiratory failure with hypoxia: Secondary | ICD-10-CM | POA: Diagnosis not present

## 2020-10-04 DIAGNOSIS — R7989 Other specified abnormal findings of blood chemistry: Secondary | ICD-10-CM

## 2020-10-04 DIAGNOSIS — Z7952 Long term (current) use of systemic steroids: Secondary | ICD-10-CM

## 2020-10-04 DIAGNOSIS — Z880 Allergy status to penicillin: Secondary | ICD-10-CM

## 2020-10-04 DIAGNOSIS — I5031 Acute diastolic (congestive) heart failure: Secondary | ICD-10-CM | POA: Diagnosis present

## 2020-10-04 DIAGNOSIS — J690 Pneumonitis due to inhalation of food and vomit: Secondary | ICD-10-CM | POA: Diagnosis not present

## 2020-10-04 DIAGNOSIS — E7800 Pure hypercholesterolemia, unspecified: Secondary | ICD-10-CM | POA: Diagnosis present

## 2020-10-04 DIAGNOSIS — Z20822 Contact with and (suspected) exposure to covid-19: Secondary | ICD-10-CM | POA: Diagnosis present

## 2020-10-04 DIAGNOSIS — T17298A Other foreign object in pharynx causing other injury, initial encounter: Secondary | ICD-10-CM | POA: Diagnosis present

## 2020-10-04 DIAGNOSIS — N400 Enlarged prostate without lower urinary tract symptoms: Secondary | ICD-10-CM | POA: Diagnosis present

## 2020-10-04 DIAGNOSIS — J9622 Acute and chronic respiratory failure with hypercapnia: Secondary | ICD-10-CM | POA: Diagnosis not present

## 2020-10-04 DIAGNOSIS — Z7901 Long term (current) use of anticoagulants: Secondary | ICD-10-CM

## 2020-10-04 DIAGNOSIS — X58XXXA Exposure to other specified factors, initial encounter: Secondary | ICD-10-CM | POA: Diagnosis present

## 2020-10-04 DIAGNOSIS — I469 Cardiac arrest, cause unspecified: Secondary | ICD-10-CM

## 2020-10-04 DIAGNOSIS — Z86718 Personal history of other venous thrombosis and embolism: Secondary | ICD-10-CM

## 2020-10-04 LAB — HEPATIC FUNCTION PANEL
ALT: 22 U/L (ref 0–44)
AST: 26 U/L (ref 15–41)
Albumin: 3.2 g/dL — ABNORMAL LOW (ref 3.5–5.0)
Alkaline Phosphatase: 61 U/L (ref 38–126)
Bilirubin, Direct: <0.1 mg/dL (ref 0.0–0.2)
Total Bilirubin: 0.7 mg/dL (ref 0.3–1.2)
Total Protein: 7 g/dL (ref 6.5–8.1)

## 2020-10-04 LAB — PROTIME-INR
INR: 1.2 (ref 0.8–1.2)
Prothrombin Time: 14.9 seconds (ref 11.4–15.2)

## 2020-10-04 LAB — CBC
HCT: 32 % — ABNORMAL LOW (ref 39.0–52.0)
Hemoglobin: 10.6 g/dL — ABNORMAL LOW (ref 13.0–17.0)
MCH: 28.7 pg (ref 26.0–34.0)
MCHC: 33.1 g/dL (ref 30.0–36.0)
MCV: 86.7 fL (ref 80.0–100.0)
Platelets: 356 10*3/uL (ref 150–400)
RBC: 3.69 MIL/uL — ABNORMAL LOW (ref 4.22–5.81)
RDW: 15.2 % (ref 11.5–15.5)
WBC: 13.3 10*3/uL — ABNORMAL HIGH (ref 4.0–10.5)
nRBC: 0 % (ref 0.0–0.2)

## 2020-10-04 LAB — URINALYSIS, COMPLETE (UACMP) WITH MICROSCOPIC
Bacteria, UA: NONE SEEN
Bilirubin Urine: NEGATIVE
Glucose, UA: NEGATIVE mg/dL
Hgb urine dipstick: NEGATIVE
Ketones, ur: NEGATIVE mg/dL
Leukocytes,Ua: NEGATIVE
Nitrite: NEGATIVE
Protein, ur: NEGATIVE mg/dL
Specific Gravity, Urine: 1.013 (ref 1.005–1.030)
Squamous Epithelial / HPF: NONE SEEN (ref 0–5)
pH: 5 (ref 5.0–8.0)

## 2020-10-04 LAB — PROCALCITONIN: Procalcitonin: 0.13 ng/mL

## 2020-10-04 LAB — RESPIRATORY PANEL BY RT PCR (FLU A&B, COVID)
Influenza A by PCR: NEGATIVE
Influenza B by PCR: NEGATIVE
SARS Coronavirus 2 by RT PCR: NEGATIVE

## 2020-10-04 LAB — BASIC METABOLIC PANEL
Anion gap: 7 (ref 5–15)
BUN: 30 mg/dL — ABNORMAL HIGH (ref 8–23)
CO2: 26 mmol/L (ref 22–32)
Calcium: 9.1 mg/dL (ref 8.9–10.3)
Chloride: 104 mmol/L (ref 98–111)
Creatinine, Ser: 1.63 mg/dL — ABNORMAL HIGH (ref 0.61–1.24)
GFR, Estimated: 36 mL/min — ABNORMAL LOW (ref >60)
Glucose, Bld: 139 mg/dL — ABNORMAL HIGH (ref 70–99)
Potassium: 4.2 mmol/L (ref 3.5–5.1)
Sodium: 137 mmol/L (ref 135–145)

## 2020-10-04 LAB — TROPONIN I (HIGH SENSITIVITY)
Troponin I (High Sensitivity): 46 ng/L — ABNORMAL HIGH (ref <18)
Troponin I (High Sensitivity): 75 ng/L — ABNORMAL HIGH (ref <18)

## 2020-10-04 LAB — LACTIC ACID, PLASMA: Lactic Acid, Venous: 1.2 mmol/L (ref 0.5–1.9)

## 2020-10-04 LAB — BRAIN NATRIURETIC PEPTIDE: B Natriuretic Peptide: 166.3 pg/mL — ABNORMAL HIGH (ref 0.0–100.0)

## 2020-10-04 MED ORDER — ACETAMINOPHEN 325 MG PO TABS
650.0000 mg | ORAL_TABLET | Freq: Once | ORAL | Status: AC
  Administered 2020-10-04: 650 mg via ORAL
  Filled 2020-10-04: qty 2

## 2020-10-04 MED ORDER — LORAZEPAM 0.5 MG PO TABS: 0.5000 mg | ORAL_TABLET | Freq: Three times a day (TID) | ORAL | Status: DC | PRN

## 2020-10-04 MED ORDER — BUDESONIDE 0.5 MG/2ML IN SUSP
0.5000 mg | Freq: Every day | RESPIRATORY_TRACT | Status: DC
  Administered 2020-10-05 – 2020-10-06 (×2): 0.5 mg via RESPIRATORY_TRACT
  Filled 2020-10-04 (×2): qty 2

## 2020-10-04 MED ORDER — APIXABAN 2.5 MG PO TABS
2.5000 mg | ORAL_TABLET | Freq: Two times a day (BID) | ORAL | Status: DC
  Administered 2020-10-05 (×2): 2.5 mg via ORAL
  Filled 2020-10-04 (×3): qty 1

## 2020-10-04 MED ORDER — ALBUTEROL SULFATE HFA 108 (90 BASE) MCG/ACT IN AERS
2.0000 | INHALATION_SPRAY | Freq: Four times a day (QID) | RESPIRATORY_TRACT | Status: DC | PRN
  Filled 2020-10-04 (×2): qty 6.7

## 2020-10-04 MED ORDER — ATORVASTATIN CALCIUM 20 MG PO TABS
20.0000 mg | ORAL_TABLET | Freq: Every day | ORAL | Status: DC
  Administered 2020-10-05 – 2020-10-08 (×3): 20 mg via ORAL
  Filled 2020-10-04 (×4): qty 1

## 2020-10-04 MED ORDER — AMIODARONE HCL 200 MG PO TABS
100.0000 mg | ORAL_TABLET | Freq: Every day | ORAL | Status: DC
  Administered 2020-10-05 – 2020-10-09 (×4): 100 mg via ORAL
  Filled 2020-10-04 (×4): qty 1

## 2020-10-04 MED ORDER — GABAPENTIN 100 MG PO CAPS
100.0000 mg | ORAL_CAPSULE | Freq: Every morning | ORAL | Status: DC
  Administered 2020-10-05 – 2020-10-09 (×4): 100 mg via ORAL
  Filled 2020-10-04 (×4): qty 1

## 2020-10-04 MED ORDER — ALBUTEROL SULFATE (2.5 MG/3ML) 0.083% IN NEBU: 2.5000 mg | INHALATION_SOLUTION | RESPIRATORY_TRACT | Status: DC | PRN

## 2020-10-04 MED ORDER — PREDNISONE 20 MG PO TABS
40.0000 mg | ORAL_TABLET | Freq: Two times a day (BID) | ORAL | Status: DC
  Administered 2020-10-05: 40 mg via ORAL
  Filled 2020-10-04: qty 2

## 2020-10-04 MED ORDER — IPRATROPIUM-ALBUTEROL 0.5-2.5 (3) MG/3ML IN SOLN
3.0000 mL | Freq: Four times a day (QID) | RESPIRATORY_TRACT | Status: DC
  Administered 2020-10-05 – 2020-10-06 (×4): 3 mL via RESPIRATORY_TRACT
  Filled 2020-10-04 (×5): qty 3

## 2020-10-04 MED ORDER — ACETAMINOPHEN 325 MG PO TABS
650.0000 mg | ORAL_TABLET | Freq: Four times a day (QID) | ORAL | Status: DC | PRN
  Administered 2020-10-06: 650 mg via ORAL
  Filled 2020-10-04: qty 2

## 2020-10-04 MED ORDER — ACETAMINOPHEN 650 MG RE SUPP: 650.0000 mg | Freq: Four times a day (QID) | RECTAL | Status: DC | PRN

## 2020-10-04 MED ORDER — PANTOPRAZOLE SODIUM 40 MG PO TBEC
40.0000 mg | DELAYED_RELEASE_TABLET | Freq: Two times a day (BID) | ORAL | Status: DC
  Administered 2020-10-05 – 2020-10-09 (×8): 40 mg via ORAL
  Filled 2020-10-04 (×8): qty 1

## 2020-10-04 MED ORDER — MIDODRINE HCL 5 MG PO TABS
10.0000 mg | ORAL_TABLET | Freq: Three times a day (TID) | ORAL | Status: DC
  Administered 2020-10-05 – 2020-10-08 (×9): 10 mg via ORAL
  Filled 2020-10-04 (×12): qty 2

## 2020-10-04 MED ORDER — FINASTERIDE 5 MG PO TABS
5.0000 mg | ORAL_TABLET | Freq: Every day | ORAL | Status: DC
  Administered 2020-10-05 – 2020-10-09 (×4): 5 mg via ORAL
  Filled 2020-10-04 (×4): qty 1

## 2020-10-04 MED ORDER — SODIUM CHLORIDE 0.9 % IV SOLN
75.0000 mL/h | INTRAVENOUS | Status: DC
  Administered 2020-10-05: 75 mL/h via INTRAVENOUS

## 2020-10-04 MED ORDER — TAMSULOSIN HCL 0.4 MG PO CAPS
0.4000 mg | ORAL_CAPSULE | Freq: Every day | ORAL | Status: DC
  Administered 2020-10-05 – 2020-10-08 (×3): 0.4 mg via ORAL
  Filled 2020-10-04 (×3): qty 1

## 2020-10-04 NOTE — H&P (Signed)
Wesley Newton RCV:[id number] DOB: 12/08/1927 DOA: 10/04/2020     PCP: Baxter Hire, MD   Outpatient Specialists:     Pulmonary Erby Pian, MD    Patient arrived to ER on 10/04/20 at 1338 Referred by Attending Naaman Plummer, MD   Patient coming from: home Lives alone  Chief Complaint:   Chief Complaint  Patient presents with  . Shortness of Breath    HPI: Wesley Newton is a 86 y.o. male with medical history significant of COPD, pulmonary fibrosis, History of DVT, HLD, A. Fib, PAD BPH  Presented with shortness of breath and hypertension for past 4 days usually he is on 2 L of nasal cannula at home but had to increase it after 3.  He checks his pulse ox this morning was in the 60s.  No associated chest pain Reports he feels like his breathing is tight Infectious risk factors:  Reports   shortness of breath, dry cough,     Has   been vaccinated against COVID    Initial COVID TEST  NEGATIVE   Lab Results  Component Value Date   [place] 10/04/2020   [place] NEGATIVE 12/29/2019   [place] NEGATIVE 05/04/2019     Regarding pertinent Chronic problems:     Hyperlipidemia - on statins Lipitor     COPD/pulmonary fibrosis-  followed by pulmonology   on baseline oxygen  2 L,      A. Fib -  - CHA2DS2 vas score    3        current  on anticoagulation with   Eliquis,           - Rhythm control:   amiodarone    CKD stage III  baseline Cr fluctuating    Lab Results  Component Value Date   CREATININE 1.63 (H) 10/04/2020   CREATININE 0.49 (L) 08/05/2020   CREATININE 1.75 (H) 06/11/2020     BPH - on Flomax, Proscar    Chronic anemia - baseline hg Hemoglobin & Hematocrit  Recent Labs    06/11/20 1011 08/05/20 1241 10/04/20 1357  HGB 11.9* 11.5* 10.6*    While in ER: Noted to be have elevation white blood cell count up to 13.3 Troponin elevated 46-75 74 stable    Hospitalist was called for admission for acute on chronic  respiratory failure  The following Work up has been ordered so far:  Orders Placed This Encounter  Procedures  . Culture, blood (Routine x 2)  . Respiratory Panel by RT PCR (Flu A&B, Covid) - Nasopharyngeal Swab  . DG Chest 2 View  . Basic metabolic panel  . CBC  . Protime-INR (order if Patient is taking Coumadin / Warfarin)  . Urinalysis, Complete w Microscopic  . Brain natriuretic peptide  . Document Height and Actual Weight  . Notify Physician if pt is possible Sepsis patient  . Document height and weight  . Consult to hospitalist  ALL PATIENTS BEING ADMITTED/HAVING PROCEDURES NEED COVID-19 SCREENING  . ED EKG     Following Medications were ordered in ER: Medications  acetaminophen (TYLENOL) tablet 650 mg (650 mg Oral Given 10/04/20 2229)        Consult Orders  (From admission, onward)         Start     Ordered   10/04/20 2217  Consult to hospitalist  ALL PATIENTS BEING ADMITTED/HAVING PROCEDURES NEED COVID-19 SCREENING  Once       Comments: ALL PATIENTS BEING ADMITTED/HAVING  PROCEDURES NEED COVID-19 SCREENING  Provider:  (Not yet assigned)  Question Answer Comment  Place call to: [phone]   Reason for Consult Admit      10/04/20 2216          Significant initial  Findings: Abnormal Labs Reviewed  BASIC METABOLIC PANEL - Abnormal; Notable for the following components:      Result Value   Glucose, Bld 139 (*)    BUN 30 (*)    Creatinine, Ser 1.63 (*)    GFR, Estimated 36 (*)    All other components within normal limits  CBC - Abnormal; Notable for the following components:   WBC 13.3 (*)    RBC 3.69 (*)    Hemoglobin 10.6 (*)    HCT 32.0 (*)    All other components within normal limits  URINALYSIS, COMPLETE (UACMP) WITH MICROSCOPIC - Abnormal; Notable for the following components:   Color, Urine YELLOW (*)    APPearance CLEAR (*)    All other components within normal limits  BRAIN NATRIURETIC PEPTIDE - Abnormal; Notable for the following components:     B Natriuretic Peptide 166.3 (*)    All other components within normal limits  BLOOD GAS, ARTERIAL - Abnormal; Notable for the following components:   pH, Arterial 7.46 (*)    Bicarbonate 28.4 (*)    Acid-Base Excess 4.2 (*)    All other components within normal limits  HEPATIC FUNCTION PANEL - Abnormal; Notable for the following components:   Albumin 3.2 (*)    All other components within normal limits  CBC WITH DIFFERENTIAL/PLATELET - Abnormal; Notable for the following components:   WBC 14.1 (*)    RBC 3.99 (*)    Hemoglobin 11.5 (*)    HCT 34.8 (*)    Monocytes Absolute 1.3 (*)    Eosinophils Absolute 1.4 (*)    All other components within normal limits  FIBRIN DERIVATIVES D-DIMER ([facility] ONLY) - Abnormal; Notable for the following components:   Fibrin derivatives D-dimer ([facility]) 1,211.58 (*)    All other components within normal limits  TROPONIN I (HIGH SENSITIVITY) - Abnormal; Notable for the following components:   Troponin I (High Sensitivity) 46 (*)    All other components within normal limits  TROPONIN I (HIGH SENSITIVITY) - Abnormal; Notable for the following components:   Troponin I (High Sensitivity) 75 (*)    All other components within normal limits  TROPONIN I (HIGH SENSITIVITY) - Abnormal; Notable for the following components:   Troponin I (High Sensitivity) 74 (*)    All other components within normal limits   Otherwise labs showing:   Recent Labs  Lab 10/04/20 1357 10/05/20 0026  NA 137  --   K 4.2  --   CO2 26  --   GLUCOSE 139*  --   BUN 30*  --   CREATININE 1.63*  --   CALCIUM 9.1  --   MG  --  2.4    Cr Up from baseline see below Lab Results  Component Value Date   CREATININE 1.63 (H) 10/04/2020   CREATININE 0.49 (L) 08/05/2020   CREATININE 1.75 (H) 06/11/2020    No results for input(s): AST, ALT, ALKPHOS, BILITOT, PROT, ALBUMIN in the last 168 hours. Lab Results  Component Value Date   CALCIUM 9.1 10/04/2020   PHOS 3.3 03/05/2019    WBC     Component Value Date/Time   WBC 14.1 (H) 10/05/2020 0026   LYMPHSABS 3.8 10/05/2020 0026   LYMPHSABS  2.9 12/12/2012 0835   MONOABS 1.3 (H) 10/05/2020 0026   MONOABS 1.1 (H) 12/12/2012 0835   EOSABS 1.4 (H) 10/05/2020 0026   EOSABS 0.2 12/12/2012 0835   BASOSABS 0.1 10/05/2020 0026   BASOSABS 0.2 (H) 12/12/2012 0835    Plt: Lab Results  Component Value Date   PLT 356 10/04/2020   Lactic Acid, Venous    Component Value Date/Time   LATICACIDVEN 1.2 10/04/2020 1357       HG/HCT  Down  from baseline see below    Component Value Date/Time   HGB 10.6 (L) 10/04/2020 1357   HGB 13.3 12/12/2012 0835   HCT 32.0 (L) 10/04/2020 1357   HCT 41.8 12/12/2012 0835   MCV 86.7 10/04/2020 1357   MCV 86 12/12/2012 0835    Troponin 46-75 -74   ECG: Ordered Personally reviewed by me showing: HR : 81 Rhythm:  NSR,    no evidence of ischemic changes QTC 425   BNP (last 3 results) Recent Labs    10/04/20 1357  BNP 166.3*     UA   no evidence of UTI      Urine analysis:    Component Value Date/Time   COLORURINE YELLOW (A) 10/04/2020 1732   APPEARANCEUR CLEAR (A) 10/04/2020 1732   LABSPEC 1.013 10/04/2020 1732   PHURINE 5.0 10/04/2020 1732   GLUCOSEU NEGATIVE 10/04/2020 1732   HGBUR NEGATIVE 10/04/2020 1732   BILIRUBINUR NEGATIVE 10/04/2020 1732   KETONESUR NEGATIVE 10/04/2020 1732   PROTEINUR NEGATIVE 10/04/2020 1732   NITRITE NEGATIVE 10/04/2020 1732   LEUKOCYTESUR NEGATIVE 10/04/2020 1732      Ordered   CXR -fibrosis and emphysema  ED Triage Vitals  Enc Vitals Group     BP 10/04/20 1343 (!) 91/44     Pulse Rate 10/04/20 1343 82     Resp 10/04/20 1343 18     Temp 10/04/20 1343 99 F (37.2 C)     Temp Source 10/04/20 1343 Oral     SpO2 10/04/20 1343 92 %     Weight 10/04/20 1344 153 lb (69.4 kg)     Height 10/04/20 1344 5\' 6"  (1.676 m)     Head Circumference --      Peak Flow --      Pain Score 10/04/20 1344 5     Pain Loc --      Pain Edu? --       Excl. in [place]? --   TMAX(24)@       Latest  Blood pressure 121/73, pulse 83, temperature 98.4 F (36.9 C), temperature source Oral, resp. rate (!) 27, height 5\' 6"  (1.676 m), weight 69.4 kg, SpO2 93 %.    Review of Systems:    Pertinent positives include:   shortness of breath at rest.  Constitutional:  No weight loss, night sweats, Fevers, chills, fatigue, weight loss  HEENT:  No headaches, Difficulty swallowing,Tooth/dental problems,Sore throat,  No sneezing, itching, ear ache, nasal congestion, post nasal drip,  Cardio-vascular:  No chest pain, Orthopnea, PND, anasarca, dizziness, palpitations.no Bilateral lower extremity swelling  GI:  No heartburn, indigestion, abdominal pain, nausea, vomiting, diarrhea, change in bowel habits, loss of appetite, melena, blood in stool, hematemesis Resp:  no No dyspnea on exertion, No excess mucus, no productive cough, No non-productive cough, No coughing up of blood.No change in color of mucus.No wheezing. Skin:  no rash or lesions. No jaundice GU:  no dysuria, change in color of urine, no urgency or frequency. No straining to urinate.  No flank pain.  Musculoskeletal:  No joint pain or no joint swelling. No decreased range of motion. No back pain.  Psych:  No change in mood or affect. No depression or anxiety. No memory loss.  Neuro: no localizing neurological complaints, no tingling, no weakness, no double vision, no gait abnormality, no slurred speech, no confusion  All systems reviewed and apart from [place] all are negative  Past Medical History:   Past Medical History:  Diagnosis Date  . Asthma   . BPH (benign prostatic hyperplasia)   . Bronchitis   . Cancer ([place])   . COPD (chronic obstructive pulmonary disease) ([place])   . DVT of leg (deep venous thrombosis) ([place])   . Elevated PSA   . Emphysema lung ([place])   . Emphysema of lung ([place])   . Hyperlipidemia   . Nocturia   . Pulmonary fibrosis ([place])   . Pulmonary fibrosis ([place])   .  Skin cancer      Past Surgical History:  Procedure Laterality Date  . CATARACT EXTRACTION    . HERNIA REPAIR    . TONSILLECTOMY      Social History:  Ambulatory   Cane,      reports that he has quit smoking. He has never used smokeless tobacco. He reports that he does not drink alcohol and does not use drugs.   Family History:   Family History  Problem Relation Age of Onset  . Heart Problems Brother   . Kidney disease Neg Hx   . Prostate cancer Neg Hx     Allergies: Allergies  Allergen Reactions  . Amoxicillin Other (See Comments)    DID THE REACTION INVOLVE: Swelling of the face/tongue/throat, SOB, or low BP? Unknown Sudden or severe rash/hives, skin peeling, or the inside of the mouth or nose? Unknown Did it require medical treatment? Unknown When did it last happen?Unknown If all above answers are "NO", may proceed with cephalosporin use.   . Codeine Nausea And Vomiting  . Rapaflo [Silodosin] Other (See Comments)    Reaction:  Unknown   . Tramadol Nausea And Vomiting and Other (See Comments)    Pt states that this medication makes him feel crazy.    . Trimethoprim Other (See Comments)    Reaction: unknown     Prior to Admission medications   Medication Sig Start Date End Date Taking? Authorizing Provider  albuterol (PROVENTIL HFA;VENTOLIN HFA) 108 (90 Base) MCG/ACT inhaler Inhale 2 puffs into the lungs every 6 (six) hours as needed for wheezing or shortness of breath.    [provider]  amiodarone (PACERONE) 200 MG tablet Take 1 tablet (200 mg total) by mouth 2 (two) times daily. Patient taking differently: Take 100 mg by mouth daily.  05/07/19   Dustin Flock, MD  apixaban (ELIQUIS) 2.5 MG TABS tablet Take 1 tablet (2.5 mg total) by mouth 2 (two) times daily. 05/07/19   Dustin Flock, MD  atorvastatin (LIPITOR) 20 MG tablet Take 1 tablet (20 mg total) by mouth daily at 6 PM. 05/07/19   Dustin Flock, MD  azelastine (ASTELIN) 0.1 % nasal spray  Place 1 spray into both nostrils daily. 01/10/19   [provider]  budesonide (PULMICORT) 0.5 MG/2ML nebulizer solution Take 0.5 mg by nebulization daily.    [provider]  dextromethorphan-guaiFENesin (MUCINEX DM) 30-600 MG 12hr tablet Take 1 tablet by mouth 2 (two) times daily.    [provider]  feeding supplement, ENSURE ENLIVE, (ENSURE ENLIVE) LIQD Take 237 mLs by  mouth 2 (two) times daily between meals. 12/21/18   Henreitta Leber, MD  finasteride (PROSCAR) 5 MG tablet Take 1 tablet (5 mg total) by mouth daily. 12/24/19   Zara Council A, PA-C  gabapentin (NEURONTIN) 100 MG capsule Take 100-200 mg by mouth 2 (two) times daily. Take 2capsules (200mg ) by mouth every night and 1 capsule (100mg ) by mouth every morning. 02/04/19   [provider]  guaiFENesin (MUCINEX) 600 MG 12 hr tablet Take 1 tablet (600 mg total) by mouth 2 (two) times daily. 05/07/19   Dustin Flock, MD  ipratropium-albuterol (DUONEB) 0.5-2.5 (3) MG/3ML SOLN Inhale 3 mLs into the lungs 4 (four) times daily. 01/14/19   [provider]  LORazepam (ATIVAN) 0.5 MG tablet Take 1 tablet (0.5 mg total) by mouth daily as needed for anxiety. 05/07/19   Dustin Flock, MD  midodrine (PROAMATINE) 10 MG tablet Take 1 tablet (10 mg total) by mouth 3 (three) times daily with meals. 05/07/19   Dustin Flock, MD  Multiple Vitamins-Minerals (OCUVITE ADULT 50+ PO) Take 1 capsule by mouth daily.    [provider]  naphazoline-glycerin (CLEAR EYES REDNESS) 0.012-0.2 % SOLN Place 1-2 drops into both eyes 4 (four) times daily as needed for eye irritation. 12/21/18   Henreitta Leber, MD  pantoprazole (PROTONIX) 40 MG tablet Take 1 tablet (40 mg total) by mouth 2 (two) times daily. 05/07/19   Dustin Flock, MD  tamsulosin (FLOMAX) 0.4 MG CAPS capsule TAKE 2 CAPSULES BY MOUTH EVERY DAY Patient taking differently: Take 0.4 mg by mouth daily after supper.  05/13/20   Debroah Loop, PA-C    Physical Exam: Vitals with BMI 10/04/2020 10/04/2020 10/04/2020  Height - - -  Weight - - -  BMI - - -  Systolic 831 517 616  Diastolic 73 60 57  Pulse 83 79 91    1. General:  in No   Acute distress    Chronically ill  -appearing 2. Psychological: Alert and  Oriented 3. Head/ENT:    Dry Mucous Membranes                          Head Non traumatic, neck supple                           Poor Dentition 4. SKIN: decreased Skin turgor,  Skin clean Dry and intact no rash 5. Heart: Regular rate and rhythm no  Murmur, no Rub or gallop 6. Lungs:   no wheezes fi9ne crackles   7. Abdomen: Soft,  non-tender, Non distended bowel sounds present 8. Lower extremities: no clubbing, cyanosis, no  edema 9. Neurologically Grossly intact, moving all 4 extremities equally   10. MSK: Normal range of motion   All other LABS:     Recent Labs  Lab 10/04/20 1357  WBC 13.3*  HGB 10.6*  HCT 32.0*  MCV 86.7  PLT 356     Recent Labs  Lab 10/04/20 1357  NA 137  K 4.2  CL 104  CO2 26  GLUCOSE 139*  BUN 30*  CREATININE 1.63*  CALCIUM 9.1     No results for input(s): AST, ALT, ALKPHOS, BILITOT, PROT, ALBUMIN in the last 168 hours.     Cultures:    Component Value Date/Time   SDES BLOOD RAC 03/03/2019 1829   SPECREQUEST  03/03/2019 1829    BOTTLES DRAWN AEROBIC AND ANAEROBIC Blood Culture results may  not be optimal due to an excessive volume of blood received in culture bottles   CULT  03/03/2019 1829    NO GROWTH 5 DAYS Performed at [facility], [address] [place], [place] [address]    REPTSTATUS 03/08/2019 FINAL 03/03/2019 1829     Radiological Exams on Admission: DG Chest 2 View  Result Date: 10/04/2020 CLINICAL DATA:  Shortness of breath and hypotension.  Chronic cough. EXAM: CHEST - 2 VIEW COMPARISON:  Multiple exams, including 08/05/2020 FINDINGS: Chronic peripheral coarse interstitial lung disease compatible with fibrosis. Underlying emphysema. The  interstitial accentuation appears similar to the prior exam. Heart size within normal limits. Atherosclerotic calcification of the aortic arch. No blunting of the costophrenic angles. IMPRESSION: 1. Stable chronic peripheral coarse interstitial lung disease compatible with fibrosis superimposed on underlying emphysema. 2. Aortic Atherosclerosis (ICD10-I70.0) and Emphysema (ICD10-J43.9). Electronically Signed   By: Van Clines M.D.   On: 10/04/2020 15:08    Chart has been reviewed  Assessment/Plan   86 y.o. male with medical history significant of COPD, pulmonary fibrosis, History of DVT, HLD, A. Fib, PAD BPH Admitted for acute on chronic respiratory failure  Present on Admission: . Acute on chronic respiratory failure ([place]) unclear etiology could be multifactorial.  Patient has history of pulmonary fibrosis and COPD on chronic steroids could be exacerbated. Continue albuterol as needed continue home inhalers Increase dose of steroids to treat possible COPD component No evidence of infiltrate on chest x-ray Patient on chronic anticoagulation with Eliquis Low GFR hold off on CTA at this time given low probability PE  . PAD (peripheral artery disease) (HCC) -chronic stable  . Idiopathic pulmonary fibrosis ([place]) longstanding at baseline 2 L of oxygen patient will continue to get follow-up with pulmonology   . Hypercholesterolemia -chronic stable resume home meds   . HTN (hypertension) patient initially hypotensive his been taking midodrine Blood pressure has been soft as an outpatient as well.  Will restart midodrine for tonight  . GERD (gastroesophageal reflux disease) -chronic stable continue PPI  . Chronic obstructive pulmonary disease (HCC) -possibly with acute exacerbation versus decompensated pulmonary fibrosis Continue DuoNeb, home medications albuterol as needed Increase dose of steroids patient on chronic 10 mg of prednisone.  Marland Kitchen BPH (benign prostatic hyperplasia) -stable  chronic continue home medication  . AF (paroxysmal atrial fibrillation) (HCC) -continue amiodarone currently rate controlled Continue Eliquis, pt states he is compliant   Elevated troponin likely demand related in the setting of hypoxia.  Obtain echogram and continue to cycle cardiac enzymes   Other plan as per orders.  DVT prophylaxis:   Eliquis     Code Status:    Code Status: Prior FULL CODE  as per patient   I had personally discussed CODE STATUS with patient     Family Communication:   Family not at  Bedside   Disposition Plan:   To home once workup is complete and patient is stable   Following barriers for discharge:                           Hypoxia stable                       Would benefit from PT/OT eval prior to DC  Ordered  Consults called: none    Admission status:  ED Disposition    ED Disposition Condition [place]: [place] [[id number]]  Level of Care: Med-Surg [16]  Covid Evaluation: Confirmed COVID Negative  Diagnosis: Acute on chronic respiratory failure [facility]) [[id number]]  Admitting Physician: Toy Baker [[id number]]  Attending Physician: Toy Baker [[id number]]       Obs   Level of care     Tele  24H           Lab Results  Component Value Date   [place] NEGATIVE 10/04/2020     Precautions: admitted as  Covid Negative      PPE: Used by the provider:   P100  eye Goggles,  Gloves     Juandavid Dallman 10/05/2020, 1:30 AM    [facility]     after 2 AM please page floor coverage PA If 7AM-7PM, please contact the day team taking care of the patient using [email or web address]   Patient was evaluated in the context of the global COVID-19 pandemic, which necessitated consideration that the patient might be at risk for infection with the SARS-CoV-2 virus that causes COVID-19. Institutional protocols and algorithms that pertain to the evaluation of patients  at risk for COVID-19 are in a state of rapid change based on information released by regulatory bodies including the CDC and federal and state organizations. These policies and algorithms were followed during the patient's care.

## 2020-10-04 NOTE — ED Notes (Signed)
Admitting provider at bedside.

## 2020-10-04 NOTE — ED Triage Notes (Signed)
Pt comes POV with SOB and hypotension for about 4 days. On home oxygen 2L [place] chronically. Pt bumped up to 3L this morning. AOx4.

## 2020-10-04 NOTE — ED Notes (Signed)
RT notified order for ABG

## 2020-10-04 NOTE — ED Provider Notes (Signed)
[facility] Emergency Department Provider Note   ____________________________________________   First MD Initiated Contact with Patient 10/04/20 2005     (approximate)  I have reviewed the triage vital signs and the nursing notes.   HISTORY  Chief Complaint Shortness of Breath    HPI Wesley Newton is a 86 y.o. male with a stated past medical history of emphysema, pulmonary fibrosis, COPD who presents for worsening shortness of breath and hypotension over the last 4 days.  Patient states that especially in the morning he has had to increase his oxygen from 2 L by nasal cannula to 3 L.  Patient states that he is on 2 L by nasal cannula chronically, however, patient states that his pulse ox in the morning and has been in the 60s.  Patient denies any history of obstructive sleep apnea or need for a CPAP overnight.  Patient denies chest pain         Past Medical History:  Diagnosis Date  . Asthma   . BPH (benign prostatic hyperplasia)   . Bronchitis   . Cancer ([place])   . COPD (chronic obstructive pulmonary disease) ([place])   . DVT of leg (deep venous thrombosis) ([place])   . Elevated PSA   . Emphysema lung ([place])   . Emphysema of lung ([place])   . Hyperlipidemia   . Nocturia   . Pulmonary fibrosis ([place])   . Pulmonary fibrosis ([place])   . Skin cancer     Patient Active Problem List   Diagnosis Date Noted  . Acute on chronic respiratory failure ([place]) 10/04/2020  . Atrial fibrillation with RVR ([place]) 05/04/2019  . Idiopathic pulmonary fibrosis ([place]) 03/05/2019  . Malnutrition of moderate degree 12/21/2018  . Influenza B 12/19/2018  . CAP (community acquired pneumonia) 12/19/2018  . Lumbar disc herniation 10/10/2017  . Degenerative disc disease 08/01/2017  . PAD (peripheral artery disease) ([place]) 04/25/2017  . BPH (benign prostatic hyperplasia) 01/10/2016  . Nocturia 01/10/2016  . Benign prostatic hyperplasia with urinary obstruction 01/10/2016  . AB  (asthmatic bronchitis) 12/17/2014  . Carotid artery disease ([place]) 12/04/2014  . GERD (gastroesophageal reflux disease) 12/04/2014  . H/O respiratory system disease 12/04/2014  . H/O peptic ulcer 12/04/2014  . Hypercholesterolemia 12/04/2014  . Unstable angina pectoris ([place]) 12/04/2014  . Amnesia 12/04/2014  . Chronic obstructive pulmonary disease ([place]) 06/11/2014  . Cephalalgia 06/11/2014  . HTN (hypertension) 06/11/2014    Past Surgical History:  Procedure Laterality Date  . CATARACT EXTRACTION    . HERNIA REPAIR    . TONSILLECTOMY      Prior to Admission medications   Medication Sig Start Date End Date Taking? Authorizing Provider  albuterol (PROVENTIL HFA;VENTOLIN HFA) 108 (90 Base) MCG/ACT inhaler Inhale 2 puffs into the lungs every 6 (six) hours as needed for wheezing or shortness of breath.   Yes [provider]  amiodarone (PACERONE) 200 MG tablet Take 1 tablet (200 mg total) by mouth 2 (two) times daily. Patient taking differently: Take 100 mg by mouth daily.  05/07/19  Yes Dustin Flock, MD  apixaban (ELIQUIS) 2.5 MG TABS tablet Take 1 tablet (2.5 mg total) by mouth 2 (two) times daily. 05/07/19  Yes Dustin Flock, MD  atorvastatin (LIPITOR) 20 MG tablet Take 1 tablet (20 mg total) by mouth daily at 6 PM. 05/07/19  Yes Dustin Flock, MD  azelastine (ASTELIN) 0.1 % nasal spray Place 1 spray into both nostrils daily. 01/10/19  Yes [provider]  budesonide (PULMICORT)  0.5 MG/2ML nebulizer solution Take 0.5 mg by nebulization daily.   Yes [provider]  finasteride (PROSCAR) 5 MG tablet Take 1 tablet (5 mg total) by mouth daily. 12/24/19  Yes McGowan, Larene Beach A, PA-C  fluticasone (FLONASE) 50 MCG/ACT nasal spray Place 2 sprays into both nostrils daily. 09/23/20  Yes [provider]  gabapentin (NEURONTIN) 100 MG capsule Take 100-200 mg by mouth 2 (two) times daily. Take 1 capsule (100mg ) by mouth every morning and 2 capsules (200mg ) by mouth  every night. 02/04/19  Yes [provider]  ipratropium-albuterol (DUONEB) 0.5-2.5 (3) MG/3ML SOLN Inhale 3 mLs into the lungs 4 (four) times daily. 01/14/19  Yes [provider]  LORazepam (ATIVAN) 0.5 MG tablet Take 1 tablet (0.5 mg total) by mouth daily as needed for anxiety. Patient taking differently: Take 0.5 mg by mouth every 8 (eight) hours as needed for anxiety.  05/07/19  Yes Dustin Flock, MD  midodrine (PROAMATINE) 10 MG tablet Take 1 tablet (10 mg total) by mouth 3 (three) times daily with meals. 05/07/19  Yes Dustin Flock, MD  Multiple Vitamins-Minerals (OCUVITE ADULT 50+ PO) Take 1 capsule by mouth daily.   Yes [provider]  naphazoline-glycerin (CLEAR EYES REDNESS) 0.012-0.2 % SOLN Place 1-2 drops into both eyes 4 (four) times daily as needed for eye irritation. 12/21/18  Yes Sainani, Belia Heman, MD  pantoprazole (PROTONIX) 40 MG tablet Take 1 tablet (40 mg total) by mouth 2 (two) times daily. 05/07/19  Yes Dustin Flock, MD  predniSONE (DELTASONE) 10 MG tablet Take 10 mg by mouth daily. 09/14/20  Yes [provider]  tamsulosin (FLOMAX) 0.4 MG CAPS capsule TAKE 2 CAPSULES BY MOUTH EVERY DAY Patient taking differently: Take 0.4 mg by mouth daily after supper.  05/13/20  Yes Vaillancourt, Samantha, PA-C  triamcinolone ointment (KENALOG) 0.1 % Apply 1 application topically as directed. Apply 2-3 times daily 09/08/20  Yes [provider]    Allergies Amoxicillin, Codeine, Rapaflo [silodosin], Tramadol, and Trimethoprim  Family History  Problem Relation Age of Onset  . Heart Problems Brother   . Kidney disease Neg Hx   . Prostate cancer Neg Hx     Social History Social History   Tobacco Use  . Smoking status: Former Research scientist (life sciences)  . Smokeless tobacco: Never Used  . Tobacco comment: quit 40 years  Substance Use Topics  . Alcohol use: No    Alcohol/week: 0.0 standard drinks  . Drug use: No    Review of Systems Constitutional: No  fever/chills Eyes: No visual changes. ENT: No sore throat. Cardiovascular: Denies chest pain. Respiratory: Endorses shortness of breath. Gastrointestinal: No abdominal pain.  No nausea, no vomiting.  No diarrhea. Genitourinary: Negative for dysuria. Musculoskeletal: Negative for acute arthralgias Skin: Negative for rash. Neurological: Negative for headaches, weakness/numbness/paresthesias in any extremity Psychiatric: Negative for suicidal ideation/homicidal ideation   ____________________________________________   PHYSICAL EXAM:  VITAL SIGNS: ED Triage Vitals  Enc Vitals Group     BP 10/04/20 1343 (!) 91/44     Pulse Rate 10/04/20 1343 82     Resp 10/04/20 1343 18     Temp 10/04/20 1343 99 F (37.2 C)     Temp Source 10/04/20 1343 Oral     SpO2 10/04/20 1343 92 %     Weight 10/04/20 1344 153 lb (69.4 kg)     Height 10/04/20 1344 5\' 6"  (1.676 m)     Head Circumference --      Peak Flow --  Pain Score 10/04/20 1344 5     Pain Loc --      Pain Edu? --      Excl. in [place]? --    Constitutional: Alert and oriented. Well appearing and in no acute distress. Eyes: Conjunctivae are normal. PERRL. Head: Atraumatic. Nose: No congestion/rhinnorhea. Mouth/Throat: Mucous membranes are moist. Neck: No stridor Cardiovascular: Grossly normal heart sounds.  Good peripheral circulation. Respiratory: Increased respiratory effort.  Tachypnea.  Rales over bilateral lung fields.  No inspiratory or expiratory wheezing appreciated.  3 L by nasal cannula Gastrointestinal: Soft and nontender. No distention. Musculoskeletal: No obvious deformities Neurologic:  Normal speech and language. No gross focal neurologic deficits are appreciated. Skin:  Skin is warm and dry. No rash noted. Psychiatric: Mood and affect are normal. Speech and behavior are normal.  ____________________________________________   LABS (all labs ordered are listed, but only abnormal results are displayed)  Labs  Reviewed  BASIC METABOLIC PANEL - Abnormal; Notable for the following components:      Result Value   Glucose, Bld 139 (*)    BUN 30 (*)    Creatinine, Ser 1.63 (*)    GFR, Estimated 36 (*)    All other components within normal limits  CBC - Abnormal; Notable for the following components:   WBC 13.3 (*)    RBC 3.69 (*)    Hemoglobin 10.6 (*)    HCT 32.0 (*)    All other components within normal limits  URINALYSIS, COMPLETE (UACMP) WITH MICROSCOPIC - Abnormal; Notable for the following components:   Color, Urine YELLOW (*)    APPearance CLEAR (*)    All other components within normal limits  BRAIN NATRIURETIC PEPTIDE - Abnormal; Notable for the following components:   B Natriuretic Peptide 166.3 (*)    All other components within normal limits  TROPONIN I (HIGH SENSITIVITY) - Abnormal; Notable for the following components:   Troponin I (High Sensitivity) 46 (*)    All other components within normal limits  TROPONIN I (HIGH SENSITIVITY) - Abnormal; Notable for the following components:   Troponin I (High Sensitivity) 75 (*)    All other components within normal limits  RESPIRATORY PANEL BY RT PCR (FLU A&B, COVID)  CULTURE, BLOOD (ROUTINE X 2)  CULTURE, BLOOD (ROUTINE X 2)  PROTIME-INR  LACTIC ACID, PLASMA  CBC WITH DIFFERENTIAL/PLATELET  BLOOD GAS, ARTERIAL  HEPATIC FUNCTION PANEL  PROCALCITONIN  PROCALCITONIN  CBC WITH DIFFERENTIAL/PLATELET  FIBRIN DERIVATIVES D-DIMER ([facility] ONLY)  MAGNESIUM  TROPONIN I (HIGH SENSITIVITY)   ____________________________________________  EKG  ED ECG REPORT I, Naaman Plummer, the attending physician, personally viewed and interpreted this ECG.  Date: 10/04/2020 EKG Time: 1344 Rate: 81 Rhythm: normal sinus rhythm QRS Axis: normal Intervals: normal ST/T Wave abnormalities: normal Narrative Interpretation: no evidence of acute ischemia  ____________________________________________  RADIOLOGY  ED MD interpretation: 2 view x-ray of  the chest shows chronic findings of coarse interstitial disease consistent with pulmonary fibrosis.  No evidence of acute pneumonia, pneumothorax, or widened mediastinum  Official radiology report(s): DG Chest 2 View  Result Date: 10/04/2020 CLINICAL DATA:  Shortness of breath and hypotension.  Chronic cough. EXAM: CHEST - 2 VIEW COMPARISON:  Multiple exams, including 08/05/2020 FINDINGS: Chronic peripheral coarse interstitial lung disease compatible with fibrosis. Underlying emphysema. The interstitial accentuation appears similar to the prior exam. Heart size within normal limits. Atherosclerotic calcification of the aortic arch. No blunting of the costophrenic angles. IMPRESSION: 1. Stable chronic peripheral coarse interstitial lung disease compatible  with fibrosis superimposed on underlying emphysema. 2. Aortic Atherosclerosis (ICD10-I70.0) and Emphysema (ICD10-J43.9). Electronically Signed   By: Van Clines M.D.   On: 10/04/2020 15:08    ____________________________________________   PROCEDURES  Procedure(s) performed (including Critical Care):  .Critical Care Performed by: Naaman Plummer, MD Authorized by: Naaman Plummer, MD   Critical care provider statement:    Critical care time (minutes):  39   Critical care time was exclusive of:  Separately billable procedures and treating other patients   Critical care was necessary to treat or prevent imminent or life-threatening deterioration of the following conditions:  Respiratory failure   Critical care was time spent personally by me on the following activities:  Discussions with consultants, evaluation of patient's response to treatment, examination of patient, ordering and performing treatments and interventions, ordering and review of laboratory studies, ordering and review of radiographic studies, pulse oximetry, re-evaluation of patient's condition, obtaining history from patient or surrogate and review of old charts   I  assumed direction of critical care for this patient from another provider in my specialty: no   .1-3 Lead EKG Interpretation Performed by: Naaman Plummer, MD Authorized by: Naaman Plummer, MD     Interpretation: normal     ECG rate:  91   ECG rate assessment: normal     Rhythm: sinus rhythm     Ectopy: none     Conduction: normal       ____________________________________________   INITIAL IMPRESSION / ASSESSMENT AND PLAN / ED COURSE  As part of my medical decision making, I reviewed the following data within the [place] notes reviewed and incorporated, Labs reviewed, EKG interpreted, Old chart reviewed, Radiograph reviewed and Notes from prior ED visits reviewed and incorporated        Patient is a 86 year old male with a stated past medical history of pulmonary fibrosis and COPD with emphysema on 2 L nasal cannula chronically who presents for worsening shortness of breath and low blood pressures at home.  Patient has had to increase his home nasal cannula from 2-->3.  Patient patient continuing to be tachypneic and hypoxic with 3 L by nasal cannula. Laboratory evaluation significant for: elevated troponin 46-->75 Creatinine 1.63 WBC 13.3  Given patient's persistent hypoxia despite increased supplemental oxygen, patient will require admission to the internal medicine service for further evaluation and management.     ____________________________________________   FINAL CLINICAL IMPRESSION(S) / ED DIAGNOSES  Final diagnoses:  Acute on chronic respiratory failure with hypoxia (HCC)  Hypoxia  SOB (shortness of breath)     ED Discharge Orders    None       Note:  This document was prepared using Dragon voice recognition software and may include unintentional dictation errors.   Naaman Plummer, MD 10/04/20 [phone]

## 2020-10-05 ENCOUNTER — Observation Stay
Admit: 2020-10-05 | Discharge: 2020-10-05 | Disposition: A | Discharge status: Home / Self Care | Payer: Medicare Other | Attending: Internal Medicine | Admitting: Internal Medicine

## 2020-10-05 ENCOUNTER — Observation Stay: Payer: Medicare Other

## 2020-10-05 DIAGNOSIS — I130 Hypertensive heart and chronic kidney disease with heart failure and stage 1 through stage 4 chronic kidney disease, or unspecified chronic kidney disease: Secondary | ICD-10-CM | POA: Diagnosis present

## 2020-10-05 DIAGNOSIS — J9621 Acute and chronic respiratory failure with hypoxia: Secondary | ICD-10-CM | POA: Diagnosis present

## 2020-10-05 DIAGNOSIS — R0603 Acute respiratory distress: Secondary | ICD-10-CM | POA: Diagnosis present

## 2020-10-05 DIAGNOSIS — M9689 Other intraoperative and postprocedural complications and disorders of the musculoskeletal system: Secondary | ICD-10-CM | POA: Diagnosis present

## 2020-10-05 DIAGNOSIS — Y848 Other medical procedures as the cause of abnormal reaction of the patient, or of later complication, without mention of misadventure at the time of the procedure: Secondary | ICD-10-CM | POA: Diagnosis present

## 2020-10-05 DIAGNOSIS — J84112 Idiopathic pulmonary fibrosis: Secondary | ICD-10-CM | POA: Diagnosis present

## 2020-10-05 DIAGNOSIS — X58XXXA Exposure to other specified factors, initial encounter: Secondary | ICD-10-CM | POA: Diagnosis present

## 2020-10-05 DIAGNOSIS — N1831 Chronic kidney disease, stage 3a: Secondary | ICD-10-CM | POA: Diagnosis present

## 2020-10-05 DIAGNOSIS — Z8701 Personal history of pneumonia (recurrent): Secondary | ICD-10-CM | POA: Diagnosis not present

## 2020-10-05 DIAGNOSIS — I9589 Other hypotension: Secondary | ICD-10-CM | POA: Diagnosis present

## 2020-10-05 DIAGNOSIS — T17298A Other foreign object in pharynx causing other injury, initial encounter: Secondary | ICD-10-CM | POA: Diagnosis present

## 2020-10-05 DIAGNOSIS — Y92239 Unspecified place in hospital as the place of occurrence of the external cause: Secondary | ICD-10-CM | POA: Diagnosis present

## 2020-10-05 DIAGNOSIS — R0902 Hypoxemia: Secondary | ICD-10-CM | POA: Diagnosis not present

## 2020-10-05 DIAGNOSIS — J9622 Acute and chronic respiratory failure with hypercapnia: Secondary | ICD-10-CM | POA: Diagnosis not present

## 2020-10-05 DIAGNOSIS — Z66 Do not resuscitate: Secondary | ICD-10-CM | POA: Diagnosis present

## 2020-10-05 DIAGNOSIS — J441 Chronic obstructive pulmonary disease with (acute) exacerbation: Principal | ICD-10-CM

## 2020-10-05 DIAGNOSIS — R778 Other specified abnormalities of plasma proteins: Secondary | ICD-10-CM

## 2020-10-05 DIAGNOSIS — E7800 Pure hypercholesterolemia, unspecified: Secondary | ICD-10-CM | POA: Diagnosis present

## 2020-10-05 DIAGNOSIS — I5031 Acute diastolic (congestive) heart failure: Secondary | ICD-10-CM | POA: Diagnosis present

## 2020-10-05 DIAGNOSIS — E785 Hyperlipidemia, unspecified: Secondary | ICD-10-CM | POA: Diagnosis present

## 2020-10-05 DIAGNOSIS — K219 Gastro-esophageal reflux disease without esophagitis: Secondary | ICD-10-CM | POA: Diagnosis present

## 2020-10-05 DIAGNOSIS — N179 Acute kidney failure, unspecified: Secondary | ICD-10-CM | POA: Insufficient documentation

## 2020-10-05 DIAGNOSIS — I469 Cardiac arrest, cause unspecified: Secondary | ICD-10-CM | POA: Diagnosis not present

## 2020-10-05 DIAGNOSIS — Z20822 Contact with and (suspected) exposure to covid-19: Secondary | ICD-10-CM | POA: Diagnosis present

## 2020-10-05 DIAGNOSIS — J690 Pneumonitis due to inhalation of food and vomit: Secondary | ICD-10-CM | POA: Diagnosis not present

## 2020-10-05 DIAGNOSIS — I959 Hypotension, unspecified: Secondary | ICD-10-CM | POA: Insufficient documentation

## 2020-10-05 DIAGNOSIS — R7989 Other specified abnormal findings of blood chemistry: Secondary | ICD-10-CM

## 2020-10-05 DIAGNOSIS — N400 Enlarged prostate without lower urinary tract symptoms: Secondary | ICD-10-CM | POA: Diagnosis present

## 2020-10-05 DIAGNOSIS — I480 Paroxysmal atrial fibrillation: Secondary | ICD-10-CM | POA: Diagnosis present

## 2020-10-05 DIAGNOSIS — Z515 Encounter for palliative care: Secondary | ICD-10-CM | POA: Diagnosis not present

## 2020-10-05 DIAGNOSIS — I248 Other forms of acute ischemic heart disease: Secondary | ICD-10-CM | POA: Diagnosis present

## 2020-10-05 DIAGNOSIS — I739 Peripheral vascular disease, unspecified: Secondary | ICD-10-CM | POA: Diagnosis present

## 2020-10-05 LAB — FERRITIN: Ferritin: 171 ng/mL (ref 24–336)

## 2020-10-05 LAB — ECHOCARDIOGRAM COMPLETE
AR max vel: 1.72 cm2
AV Area VTI: 2.25 cm2
AV Area mean vel: 1.94 cm2
AV Mean grad: 7 mmHg
AV Peak grad: 11.2 mmHg
Ao pk vel: 1.67 m/s
Area-P 1/2: 3.77 cm2
Height: 66 in
S' Lateral: 2.01 cm
Weight: 2448 oz

## 2020-10-05 LAB — RETICULOCYTES
Immature Retic Fract: 19.2 % — ABNORMAL HIGH (ref 2.3–15.9)
RBC.: 3.69 MIL/uL — ABNORMAL LOW (ref 4.22–5.81)
Retic Count, Absolute: 54.2 10*3/uL (ref 19.0–186.0)
Retic Ct Pct: 1.5 % (ref 0.4–3.1)

## 2020-10-05 LAB — PHOSPHORUS: Phosphorus: 3.4 mg/dL (ref 2.5–4.6)

## 2020-10-05 LAB — CBC WITH DIFFERENTIAL/PLATELET
Abs Immature Granulocytes: 0.05 10*3/uL (ref 0.00–0.07)
Abs Immature Granulocytes: 0.06 10*3/uL (ref 0.00–0.07)
Basophils Absolute: 0.1 10*3/uL (ref 0.0–0.1)
Basophils Absolute: 0.1 10*3/uL (ref 0.0–0.1)
Basophils Relative: 1 %
Basophils Relative: 1 %
Eosinophils Absolute: 0.5 10*3/uL (ref 0.0–0.5)
Eosinophils Absolute: 1.4 10*3/uL — ABNORMAL HIGH (ref 0.0–0.5)
Eosinophils Relative: 10 %
Eosinophils Relative: 4 %
HCT: 31.3 % — ABNORMAL LOW (ref 39.0–52.0)
HCT: 34.8 % — ABNORMAL LOW (ref 39.0–52.0)
Hemoglobin: 10.4 g/dL — ABNORMAL LOW (ref 13.0–17.0)
Hemoglobin: 11.5 g/dL — ABNORMAL LOW (ref 13.0–17.0)
Immature Granulocytes: 0 %
Immature Granulocytes: 1 %
Lymphocytes Relative: 18 %
Lymphocytes Relative: 27 %
Lymphs Abs: 2.2 10*3/uL (ref 0.7–4.0)
Lymphs Abs: 3.8 10*3/uL (ref 0.7–4.0)
MCH: 28.8 pg (ref 26.0–34.0)
MCH: 29 pg (ref 26.0–34.0)
MCHC: 33 g/dL (ref 30.0–36.0)
MCHC: 33.2 g/dL (ref 30.0–36.0)
MCV: 87.2 fL (ref 80.0–100.0)
MCV: 87.2 fL (ref 80.0–100.0)
Monocytes Absolute: 1.2 10*3/uL — ABNORMAL HIGH (ref 0.1–1.0)
Monocytes Absolute: 1.3 10*3/uL — ABNORMAL HIGH (ref 0.1–1.0)
Monocytes Relative: 10 %
Monocytes Relative: 9 %
Neutro Abs: 7.5 10*3/uL (ref 1.7–7.7)
Neutro Abs: 8.1 10*3/uL — ABNORMAL HIGH (ref 1.7–7.7)
Neutrophils Relative %: 53 %
Neutrophils Relative %: 66 %
Platelets: 340 10*3/uL (ref 150–400)
Platelets: 374 10*3/uL (ref 150–400)
RBC: 3.59 MIL/uL — ABNORMAL LOW (ref 4.22–5.81)
RBC: 3.99 MIL/uL — ABNORMAL LOW (ref 4.22–5.81)
RDW: 14.7 % (ref 11.5–15.5)
RDW: 15.1 % (ref 11.5–15.5)
WBC: 12.2 10*3/uL — ABNORMAL HIGH (ref 4.0–10.5)
WBC: 14.1 10*3/uL — ABNORMAL HIGH (ref 4.0–10.5)
nRBC: 0 % (ref 0.0–0.2)
nRBC: 0 % (ref 0.0–0.2)

## 2020-10-05 LAB — TROPONIN I (HIGH SENSITIVITY)
Troponin I (High Sensitivity): 74 ng/L — ABNORMAL HIGH (ref <18)
Troponin I (High Sensitivity): 70 ng/L — ABNORMAL HIGH (ref <18)

## 2020-10-05 LAB — COMPREHENSIVE METABOLIC PANEL
ALT: 19 U/L (ref 0–44)
AST: 24 U/L (ref 15–41)
Albumin: 2.9 g/dL — ABNORMAL LOW (ref 3.5–5.0)
Alkaline Phosphatase: 53 U/L (ref 38–126)
Anion gap: 9 (ref 5–15)
BUN: 24 mg/dL — ABNORMAL HIGH (ref 8–23)
CO2: 24 mmol/L (ref 22–32)
Calcium: 9 mg/dL (ref 8.9–10.3)
Chloride: 105 mmol/L (ref 98–111)
Creatinine, Ser: 1.49 mg/dL — ABNORMAL HIGH (ref 0.61–1.24)
GFR, Estimated: 40 mL/min — ABNORMAL LOW (ref >60)
Glucose, Bld: 100 mg/dL — ABNORMAL HIGH (ref 70–99)
Potassium: 4.5 mmol/L (ref 3.5–5.1)
Sodium: 138 mmol/L (ref 135–145)
Total Bilirubin: 1 mg/dL (ref 0.3–1.2)
Total Protein: 6.5 g/dL (ref 6.5–8.1)

## 2020-10-05 LAB — OSMOLALITY, URINE: Osmolality, Ur: 397 mOsm/kg (ref 300–900)

## 2020-10-05 LAB — MAGNESIUM
Magnesium: 2.4 mg/dL (ref 1.7–2.4)
Magnesium: 2.2 mg/dL (ref 1.7–2.4)

## 2020-10-05 LAB — CREATININE, URINE, RANDOM: Creatinine, Urine: 65 mg/dL

## 2020-10-05 LAB — TSH: TSH: 0.655 u[IU]/mL (ref 0.350–4.500)

## 2020-10-05 LAB — FOLATE: Folate: 34 ng/mL (ref >5.9)

## 2020-10-05 LAB — SODIUM, URINE, RANDOM: Sodium, Ur: 64 mmol/L

## 2020-10-05 LAB — IRON AND TIBC
Iron: 19 ug/dL — ABNORMAL LOW (ref 45–182)
Saturation Ratios: 10 % — ABNORMAL LOW (ref 17.9–39.5)
TIBC: 185 ug/dL — ABNORMAL LOW (ref 250–450)
UIBC: 166 ug/dL

## 2020-10-05 LAB — PROCALCITONIN: Procalcitonin: <0.1 ng/mL

## 2020-10-05 LAB — BLOOD GAS, ARTERIAL

## 2020-10-05 LAB — VITAMIN B12: Vitamin B-12: 381 pg/mL (ref 180–914)

## 2020-10-05 LAB — FIBRIN DERIVATIVES D-DIMER (ARMC ONLY): Fibrin derivatives D-dimer (ARMC): 1211.58 ng/mL (FEU) — ABNORMAL HIGH (ref 0.00–499.00)

## 2020-10-05 MED ORDER — SALINE SPRAY 0.65 % NA SOLN
1.0000 | NASAL | Status: DC | PRN
  Administered 2020-10-05 – 2020-10-06 (×2): 1 via NASAL
  Filled 2020-10-05 (×2): qty 44

## 2020-10-05 MED ORDER — POLYVINYL ALCOHOL 1.4 % OP SOLN
1.0000 [drp] | OPHTHALMIC | Status: DC | PRN
  Administered 2020-10-05 – 2020-10-07 (×2): 1 [drp] via OPHTHALMIC
  Filled 2020-10-05 (×3): qty 15

## 2020-10-05 MED ORDER — GABAPENTIN 100 MG PO CAPS
200.0000 mg | ORAL_CAPSULE | Freq: Every day | ORAL | Status: DC
  Administered 2020-10-05 – 2020-10-08 (×4): 200 mg via ORAL
  Filled 2020-10-05 (×5): qty 2

## 2020-10-05 MED ORDER — METHYLPREDNISOLONE SODIUM SUCC 40 MG IJ SOLR
40.0000 mg | Freq: Two times a day (BID) | INTRAMUSCULAR | Status: DC
  Administered 2020-10-05 – 2020-10-07 (×4): 40 mg via INTRAVENOUS
  Filled 2020-10-05 (×4): qty 1

## 2020-10-05 MED ORDER — SODIUM CHLORIDE 0.9 % IV SOLN: INTRAVENOUS | Status: DC

## 2020-10-05 NOTE — Progress Notes (Signed)
*  PRELIMINARY RESULTS* Echocardiogram 2D Echocardiogram has been performed.  Sherrie Sport 10/05/2020, 9:10 AM

## 2020-10-05 NOTE — NC FL2 (Signed)
[place] LEVEL OF CARE SCREENING TOOL     IDENTIFICATION  Patient Name: Wesley Newton Birthdate: [birth year] Sex: male Admission Date (Current Location): 10/04/2020  [place] and [place] Number:  Engineering geologist and Address:  [facility], [address] [place], [place] [address]      Provider Number: [id number]  Attending Physician Name and Address:  Loletha Grayer, MD  Relative Name and Phone Number:  [name], granddaughter [phone]    Current Level of Care: Hospital Recommended Level of Care: [place] Prior Approval Number:    Date Approved/Denied:   PASRR Number: [id number]  Discharge Plan: SNF    Current Diagnoses: Patient Active Problem List   Diagnosis Date Noted  . Acute respiratory distress 10/05/2020  . Acute on chronic respiratory failure ([place]) 10/04/2020  . AF (paroxysmal atrial fibrillation) ([place]) 10/04/2020  . Atrial fibrillation with RVR ([place]) 05/04/2019  . Idiopathic pulmonary fibrosis ([place]) 03/05/2019  . Malnutrition of moderate degree 12/21/2018  . Influenza B 12/19/2018  . CAP (community acquired pneumonia) 12/19/2018  . Lumbar disc herniation 10/10/2017  . Degenerative disc disease 08/01/2017  . PAD (peripheral artery disease) ([place]) 04/25/2017  . BPH (benign prostatic hyperplasia) 01/10/2016  . Nocturia 01/10/2016  . Benign prostatic hyperplasia with urinary obstruction 01/10/2016  . AB (asthmatic bronchitis) 12/17/2014  . Carotid artery disease ([place]) 12/04/2014  . GERD (gastroesophageal reflux disease) 12/04/2014  . H/O respiratory system disease 12/04/2014  . H/O peptic ulcer 12/04/2014  . Hypercholesterolemia 12/04/2014  . Unstable angina pectoris ([place]) 12/04/2014  . Amnesia 12/04/2014  . Chronic obstructive pulmonary disease ([place]) 06/11/2014  . Cephalalgia 06/11/2014  . HTN (hypertension) 06/11/2014    Orientation RESPIRATION BLADDER Height & Weight     Self,  Time, Situation, Place  O2 Continent Weight: 69.4 kg Height:  5\' 6"  (167.6 cm)  BEHAVIORAL SYMPTOMS/MOOD NEUROLOGICAL BOWEL NUTRITION STATUS      Continent Diet (Regular)  AMBULATORY STATUS COMMUNICATION OF NEEDS Skin   Extensive Assist Verbally Normal                       Personal Care Assistance Level of Assistance  Bathing, Dressing Bathing Assistance: Limited assistance   Dressing Assistance: Limited assistance     Functional Limitations Info  Sight Sight Info: Impaired (wears glasses)        SPECIAL CARE FACTORS FREQUENCY  PT (By licensed PT), OT (By licensed OT)     PT Frequency: 5x a week OT Frequency: 5x a week            Contractures Contractures Info: Not present    Additional Factors Info  Allergies   Allergies Info: amoxicillin, codeine, rapaflo, tramadol, trimethopirm           Current Medications (10/05/2020):  This is the current hospital active medication list Current Facility-Administered Medications  Medication Dose Route Frequency Provider Last Rate Last Admin  . 0.9 %  sodium chloride infusion   Intravenous Continuous Wieting, Richard, MD      . acetaminophen (TYLENOL) tablet 650 mg  650 mg Oral Q6H PRN Toy Baker, MD       Or  . acetaminophen (TYLENOL) suppository 650 mg  650 mg Rectal Q6H PRN Doutova, Anastassia, MD      . albuterol (PROVENTIL) (2.5 MG/3ML) 0.083% nebulizer solution 2.5 mg  2.5 mg Nebulization Q2H PRN Doutova, Anastassia, MD      . albuterol (VENTOLIN HFA) 108 (90 Base) MCG/ACT  inhaler 2 puff  2 puff Inhalation Q6H PRN Doutova, Anastassia, MD      . amiodarone (PACERONE) tablet 100 mg  100 mg Oral Daily Doutova, Anastassia, MD   100 mg at 10/05/20 1046  . apixaban (ELIQUIS) tablet 2.5 mg  2.5 mg Oral BID Doutova, Anastassia, MD   2.5 mg at 10/05/20 1045  . atorvastatin (LIPITOR) tablet 20 mg  20 mg Oral q1800 Doutova, Anastassia, MD      . budesonide (PULMICORT) nebulizer solution 0.5 mg  0.5 mg Nebulization  Daily Doutova, Anastassia, MD   0.5 mg at 10/05/20 1044  . finasteride (PROSCAR) tablet 5 mg  5 mg Oral Daily Doutova, Anastassia, MD   5 mg at 10/05/20 1045  . gabapentin (NEURONTIN) capsule 100 mg  100 mg Oral q morning - 10a Doutova, Anastassia, MD   100 mg at 10/05/20 1045  . gabapentin (NEURONTIN) capsule 200 mg  200 mg Oral QHS Wieting, Richard, MD      . ipratropium-albuterol (DUONEB) 0.5-2.5 (3) MG/3ML nebulizer solution 3 mL  3 mL Inhalation QID Toy Baker, MD   3 mL at 10/05/20 1044  . LORazepam (ATIVAN) tablet 0.5 mg  0.5 mg Oral Q8H PRN Doutova, Anastassia, MD      . methylPREDNISolone sodium succinate (SOLU-MEDROL) 40 mg/mL injection 40 mg  40 mg Intravenous Q12H Wieting, Richard, MD      . midodrine (PROAMATINE) tablet 10 mg  10 mg Oral TID WC Doutova, Anastassia, MD   10 mg at 10/05/20 0900  . pantoprazole (PROTONIX) EC tablet 40 mg  40 mg Oral BID Toy Baker, MD   40 mg at 10/05/20 1045  . polyvinyl alcohol (LIQUIFILM TEARS) 1.4 % ophthalmic solution 1 drop  1 drop Both Eyes PRN Doutova, Anastassia, MD   1 drop at 10/05/20 0358  . sodium chloride (OCEAN) 0.65 % nasal spray 1 spray  1 spray Each Nare PRN Toy Baker, MD   1 spray at 10/05/20 0357  . tamsulosin (FLOMAX) capsule 0.4 mg  0.4 mg Oral QPC supper Toy Baker, MD       Current Outpatient Medications  Medication Sig Dispense Refill  . albuterol (PROVENTIL HFA;VENTOLIN HFA) 108 (90 Base) MCG/ACT inhaler Inhale 2 puffs into the lungs every 6 (six) hours as needed for wheezing or shortness of breath.    Marland Kitchen amiodarone (PACERONE) 200 MG tablet Take 1 tablet (200 mg total) by mouth 2 (two) times daily. (Patient taking differently: Take 100 mg by mouth daily. )    . apixaban (ELIQUIS) 2.5 MG TABS tablet Take 1 tablet (2.5 mg total) by mouth 2 (two) times daily. 60 tablet   . atorvastatin (LIPITOR) 20 MG tablet Take 1 tablet (20 mg total) by mouth daily at 6 PM.    . azelastine (ASTELIN) 0.1 % nasal  spray Place 1 spray into both nostrils daily.    . budesonide (PULMICORT) 0.5 MG/2ML nebulizer solution Take 0.5 mg by nebulization daily.    . finasteride (PROSCAR) 5 MG tablet Take 1 tablet (5 mg total) by mouth daily. 90 tablet 3  . fluticasone (FLONASE) 50 MCG/ACT nasal spray Place 2 sprays into both nostrils daily.    Marland Kitchen gabapentin (NEURONTIN) 100 MG capsule Take 100-200 mg by mouth 2 (two) times daily. Take 1 capsule (100mg ) by mouth every morning and 2 capsules (200mg ) by mouth every night.    Marland Kitchen ipratropium-albuterol (DUONEB) 0.5-2.5 (3) MG/3ML SOLN Inhale 3 mLs into the lungs 4 (four) times daily.    Marland Kitchen  LORazepam (ATIVAN) 0.5 MG tablet Take 1 tablet (0.5 mg total) by mouth daily as needed for anxiety. (Patient taking differently: Take 0.5 mg by mouth every 8 (eight) hours as needed for anxiety. ) 15 tablet 1  . midodrine (PROAMATINE) 10 MG tablet Take 1 tablet (10 mg total) by mouth 3 (three) times daily with meals.    . Multiple Vitamins-Minerals (OCUVITE ADULT 50+ PO) Take 1 capsule by mouth daily.    . naphazoline-glycerin (CLEAR EYES REDNESS) 0.012-0.2 % SOLN Place 1-2 drops into both eyes 4 (four) times daily as needed for eye irritation.  0  . pantoprazole (PROTONIX) 40 MG tablet Take 1 tablet (40 mg total) by mouth 2 (two) times daily.    . predniSONE (DELTASONE) 10 MG tablet Take 10 mg by mouth daily.    . tamsulosin (FLOMAX) 0.4 MG CAPS capsule TAKE 2 CAPSULES BY MOUTH EVERY DAY (Patient taking differently: Take 0.4 mg by mouth daily after supper. ) 420 capsule 0  . triamcinolone ointment (KENALOG) 0.1 % Apply 1 application topically as directed. Apply 2-3 times daily       Discharge Medications: Please see discharge summary for a list of discharge medications.  Relevant Imaging Results:  Relevant Lab Results:   Additional Information [id number]  Victorino Dike, RN

## 2020-10-05 NOTE — ED Notes (Signed)
Pt desat to 70% while on 6L; switched to non-rebreather.

## 2020-10-05 NOTE — ED Notes (Signed)
Pt assisted to toilet to have BM, 1 assist required.

## 2020-10-05 NOTE — ED Notes (Signed)
Pt alert and resting calmly in bed. Bed locked low. Rails up. Call bell within reach. Pt denies any needs currently.

## 2020-10-05 NOTE — Progress Notes (Signed)
[facility] Liaison note:  Patient is currently followed by [facility] community Palliative program at [facility] independent living. TOC Ginnie Russoli is aware. Flo Shanks BSN RN, [place]  [phone]

## 2020-10-05 NOTE — ED Notes (Addendum)
Attending [place] notified pt on non-rebreather due to desat to 70%. Pt sitting calmly in bed; no accessory muscle use noted; reg/unlabored; skin dry and appropriate color.

## 2020-10-05 NOTE — Progress Notes (Signed)
Patient ID: Wesley Newton, male   DOB: 08-31-1927, 86 y.o.   MRN: [id number]  Spoke with granddaughter, patient full code at this point.  Dr Loletha Grayer

## 2020-10-05 NOTE — Progress Notes (Signed)
Patient ID: Wesley Newton, male   DOB: October 28, 1927, 86 y.o.   MRN: [id number] [facility] PROGRESS NOTE  JLYNN LANGILLE VHQ:[id number] DOB: November 17, 1927 DOA: 10/04/2020 PCP: Baxter Hire, MD  HPI/Subjective: Patient coming in with shortness of breath.  Normally wears 2 L of oxygen at home and was on 4 L when I saw him.  He had periods where he desaturated easily into the 80s.  Objective: Vitals:   10/05/20 1219 10/05/20 1223  BP:    Pulse: 93   Resp: (!) 24 20  Temp:    SpO2: 95%     Intake/Output Summary (Last 24 hours) at 10/05/2020 1233 Last data filed at 10/05/2020 0705 Gross per 24 hour  Intake --  Output 550 ml  Net -550 ml   Filed Weights   10/04/20 1344  Weight: 69.4 kg    ROS: Review of Systems  Respiratory: Positive for cough and shortness of breath.   Cardiovascular: Negative for chest pain.  Gastrointestinal: Negative for abdominal pain, nausea and vomiting.   Exam: Physical Exam HENT:     Head: Normocephalic.     Mouth/Throat:     Pharynx: No oropharyngeal exudate.  Eyes:     General: Lids are normal.     Conjunctiva/sclera: Conjunctivae normal.     Pupils: Pupils are equal, round, and reactive to light.  Cardiovascular:     Rate and Rhythm: Normal rate and regular rhythm.     Heart sounds: Normal heart sounds, S1 normal and S2 normal.  Pulmonary:     Effort: Tachypnea and accessory muscle usage present.     Breath sounds: Decreased air movement present. Examination of the right-lower field reveals decreased breath sounds and rhonchi. Examination of the left-lower field reveals decreased breath sounds and rhonchi. Decreased breath sounds and rhonchi present. No wheezing or rales.  Abdominal:     Palpations: Abdomen is soft.     Tenderness: There is no abdominal tenderness.  Musculoskeletal:     Right lower leg: No swelling.     Left lower leg: No swelling.  Skin:    General: Skin is warm.     Findings: No rash.  Neurological:      Mental Status: He is alert and oriented to person, place, and time.       Data Reviewed: Basic Metabolic Panel: Recent Labs  Lab 10/04/20 1357 10/05/20 0026 10/05/20 0536  NA 137  --  138  K 4.2  --  4.5  CL 104  --  105  CO2 26  --  24  GLUCOSE 139*  --  100*  BUN 30*  --  24*  CREATININE 1.63*  --  1.49*  CALCIUM 9.1  --  9.0  MG  --  2.4 2.2  PHOS  --   --  3.4   Liver Function Tests: Recent Labs  Lab 10/04/20 1732 10/05/20 0536  AST 26 24  ALT 22 19  ALKPHOS 61 53  BILITOT 0.7 1.0  PROT 7.0 6.5  ALBUMIN 3.2* 2.9*   CBC: Recent Labs  Lab 10/04/20 1357 10/05/20 0026 10/05/20 0536  WBC 13.3* 14.1* 12.2*  NEUTROABS  --  7.5 8.1*  HGB 10.6* 11.5* 10.4*  HCT 32.0* 34.8* 31.3*  MCV 86.7 87.2 87.2  PLT 356 374 340   Cardiac Enzymes: First troponin 46, second troponin 75, third troponin 74, fourth troponin 70  BNP (last 3 results) Recent Labs    10/04/20 1357  BNP 166.3*  Recent Results (from the past 240 hour(s))  Culture, blood (Routine x 2)     Status: None (Preliminary result)   Collection Time: 10/04/20  1:47 PM   Specimen: BLOOD  Result Value Ref Range Status   Specimen Description BLOOD RFA  Final   Special Requests   Final    BOTTLES DRAWN AEROBIC AND ANAEROBIC Blood Culture adequate volume   Culture   Final    NO GROWTH < 24 HOURS Performed at [facility], [address], [place], [place] [address]    Report Status PENDING  Incomplete  Culture, blood (Routine x 2)     Status: None (Preliminary result)   Collection Time: 10/04/20  5:32 PM   Specimen: BLOOD  Result Value Ref Range Status   Specimen Description BLOOD RAC  Final   Special Requests   Final    BOTTLES DRAWN AEROBIC AND ANAEROBIC Blood Culture results may not be optimal due to an excessive volume of blood received in culture bottles   Culture   Final    NO GROWTH < 24 HOURS Performed at [facility], [address] [place], [place] [address]     Report Status PENDING  Incomplete  Respiratory Panel by RT PCR (Flu A&B, Covid) - Nasopharyngeal Swab     Status: None   Collection Time: 10/04/20  9:13 PM   Specimen: Nasopharyngeal Swab  Result Value Ref Range Status   SARS Coronavirus 2 by RT PCR NEGATIVE NEGATIVE Final    Comment: (NOTE) SARS-CoV-2 target nucleic acids are NOT DETECTED.  The SARS-CoV-2 RNA is generally detectable in upper respiratoy specimens during the acute phase of infection. The lowest concentration of SARS-CoV-2 viral copies this assay can detect is 131 copies/mL. A negative result does not preclude SARS-Cov-2 infection and should not be used as the sole basis for treatment or other patient management decisions. A negative result may occur with  improper specimen collection/handling, submission of specimen other than nasopharyngeal swab, presence of viral mutation(s) within the areas targeted by this assay, and inadequate number of viral copies (<131 copies/mL). A negative result must be combined with clinical observations, patient history, and epidemiological information. The expected result is Negative.  Fact Sheet for Patients:  PinkCheek.be  Fact Sheet for Healthcare Providers:  GravelBags.it  This test is no t yet approved or cleared by the [place] FDA and  has been authorized for detection and/or diagnosis of SARS-CoV-2 by FDA under an Emergency Use Authorization (EUA). This EUA will remain  in effect (meaning this test can be used) for the duration of the COVID-19 declaration under Section 564(b)(1) of the Act, 21 U.S.C. section 360bbb-3(b)(1), unless the authorization is terminated or revoked sooner.     Influenza A by PCR NEGATIVE NEGATIVE Final   Influenza B by PCR NEGATIVE NEGATIVE Final    Comment: (NOTE) The Xpert Xpress SARS-CoV-2/FLU/RSV assay is intended as an aid in  the diagnosis of influenza from Nasopharyngeal swab  specimens and  should not be used as a sole basis for treatment. Nasal washings and  aspirates are unacceptable for Xpert Xpress SARS-CoV-2/FLU/RSV  testing.  Fact Sheet for Patients: PinkCheek.be  Fact Sheet for Healthcare Providers: GravelBags.it  This test is not yet approved or cleared by the [place] FDA and  has been authorized for detection and/or diagnosis of SARS-CoV-2 by  FDA under an Emergency Use Authorization (EUA). This EUA will remain  in effect (meaning this test can be used) for the duration of  the  Covid-19 declaration under Section 564(b)(1) of the Act, 21  U.S.C. section 360bbb-3(b)(1), unless the authorization is  terminated or revoked. Performed at [facility], [address] [place], [place] [address]      Studies: DG Chest 2 View  Result Date: 10/04/2020 CLINICAL DATA:  Shortness of breath and hypotension.  Chronic cough. EXAM: CHEST - 2 VIEW COMPARISON:  Multiple exams, including 08/05/2020 FINDINGS: Chronic peripheral coarse interstitial lung disease compatible with fibrosis. Underlying emphysema. The interstitial accentuation appears similar to the prior exam. Heart size within normal limits. Atherosclerotic calcification of the aortic arch. No blunting of the costophrenic angles. IMPRESSION: 1. Stable chronic peripheral coarse interstitial lung disease compatible with fibrosis superimposed on underlying emphysema. 2. Aortic Atherosclerosis (ICD10-I70.0) and Emphysema (ICD10-J43.9). Electronically Signed   By: Van Clines M.D.   On: 10/04/2020 15:08   DG Abdomen 1 View  Result Date: 10/04/2020 CLINICAL DATA:  Fever and abdominal pain EXAM: ABDOMEN - 1 VIEW COMPARISON:  Chest x-ray from earlier in the same day. FINDINGS: Scattered large and small bowel gas is noted. No abnormal mass or abnormal calcifications are seen. No free air is noted. Fibrotic changes are noted in the lungs  bilaterally similar to that seen on prior exam. IMPRESSION: No acute abnormality noted Electronically Signed   By: Inez Catalina M.D.   On: 10/04/2020 23:26    Scheduled Meds: . amiodarone  100 mg Oral Daily  . apixaban  2.5 mg Oral BID  . atorvastatin  20 mg Oral q1800  . budesonide  0.5 mg Nebulization Daily  . finasteride  5 mg Oral Daily  . gabapentin  100 mg Oral q morning - 10a  . gabapentin  200 mg Oral QHS  . ipratropium-albuterol  3 mL Inhalation QID  . methylPREDNISolone (SOLU-MEDROL) injection  40 mg Intravenous Q12H  . midodrine  10 mg Oral TID WC  . pantoprazole  40 mg Oral BID  . tamsulosin  0.4 mg Oral QPC supper   Continuous Infusions: . sodium chloride      Assessment/Plan:  1. Acute respiratory distress, patient using accessory muscles to breathe. History of chronic respiratory failure with hypoxia on 2 L at home. When I saw him he was desaturating to around 85% on 4 L but then came back up when he was resting comfortably and holding his finger straight. This afternoon needed to go on high flow nasal cannula. We will get palliative care consultation. Spoke with granddaughter who will call me back about CODE STATUS since the patient did have a DNR previously. Concerned about the patient's increasing oxygen requirements. 2. Pulmonary fibrosis/COPD exacerbation change prednisone back to Solu-Medrol 40 mg IV twice daily. 3. Acute kidney injury with a creatinine of 1.63 on presentation. Down to 1.49 now. Continue to monitor and decrease rate of fluids. 4. Elevated troponin likely demand ischemia from acute respiratory distress. 5. Elevated D-dimer unable to get a CT scan of the chest with his kidney function at this point. Ultrasound lower extremity negative for DVT. Currently on low-dose Eliquis. 6. History of PAD on low-dose Eliquis 7. Chronic hypotension on midodrine 8. GERD on PPI 9. Palliative care consultation.     Code Status:     Code Status Orders  (From  admission, onward)         Start     Ordered   10/04/20 2348  Full code  Continuous        10/04/20 2347  Code Status History    Date Active Date Inactive Code Status Order ID Comments User Context   05/05/2019 0034 05/07/2019 1858 DNR [id number]  Lance Coon, MD Inpatient   03/03/2019 2208 03/05/2019 1734 DNR [id number]  Arta Silence, MD Inpatient   12/19/2018 0234 12/21/2018 1734 Full Code [id number]  Lance Coon, MD Inpatient   Advance Care Planning Activity     Family Communication: Spoke with granddaughter on the phone Disposition Plan: Status is: Inpatient  Dispo: The patient is from: Home              Anticipated d/c is to: To be determined              Anticipated d/c date is: Unable to be assessed at this time with increasing oxygen requirements.              Patient currently switched over to high flow nasal cannula secondary to respiratory distress. History of pulmonary fibrosis. Switch prednisone over to Solu-Medrol. Palliative care consultation. Patient's granddaughter to get back to me about his CODE STATUS.  Consultants:  Palliative care  Time spent: 28 minutes  [place]

## 2020-10-05 NOTE — TOC Initial Note (Signed)
Transition of Care [facility]) - Initial/Assessment Note    Patient Details  Name: Wesley Newton MRN: [id number] Date of Birth: [birth year]  Transition of Care [facility]) CM/SW Contact:    Victorino Dike, RN Phone Number: 10/05/2020, 12:40 PM  Clinical Narrative:                  Met with patient in room and called daughter Ivin Booty on phone.  They report patient is resident at [facility] independent Living.  He has had increasing weakness and they are agreeable to SNF at discharge.  Spoke with Seth Bake at [facility] and she has accepted him back at discharge.     Expected Discharge Plan: Skilled Nursing Facility Barriers to Discharge: Continued Medical Work up   Patient Goals and CMS Choice   CMS Medicare.gov Compare Post Acute Care list provided to:: Patient Choice offered to / list presented to : Patient  Expected Discharge Plan and Services Expected Discharge Plan: [place]   Discharge Planning Services: CM Consult Post Acute Care Choice: [place] Living arrangements for the past 2 months: [place]                                      Prior Living Arrangements/Services Living arrangements for the past 2 months: [place] Lives with:: Self Patient language and need for interpreter reviewed:: Yes Do you feel safe going back to the place where you live?: Yes      Need for Family Participation in Patient Care: Yes (Comment) Care giver support system in place?: Yes (comment)   Criminal Activity/Legal Involvement Pertinent to Current Situation/Hospitalization: No - Comment as needed  Activities of Daily Living      Permission Sought/Granted                  Emotional Assessment Appearance:: Appears stated age Attitude/Demeanor/Rapport: Engaged Affect (typically observed): Appropriate, Accepting, Pleasant Orientation: : Oriented to Self, Oriented to Place, Oriented to  Time, Oriented to  Situation Alcohol / Substance Use: Not Applicable Psych Involvement: No (comment)  Admission diagnosis:  Acute on chronic respiratory failure (HCC) [J96.20] Acute respiratory distress [R06.03] Patient Active Problem List   Diagnosis Date Noted  . Acute respiratory distress 10/05/2020  . Acute on chronic respiratory failure ([place]) 10/04/2020  . AF (paroxysmal atrial fibrillation) ([place]) 10/04/2020  . Atrial fibrillation with RVR ([place]) 05/04/2019  . Idiopathic pulmonary fibrosis ([place]) 03/05/2019  . Malnutrition of moderate degree 12/21/2018  . Influenza B 12/19/2018  . CAP (community acquired pneumonia) 12/19/2018  . Lumbar disc herniation 10/10/2017  . Degenerative disc disease 08/01/2017  . PAD (peripheral artery disease) ([place]) 04/25/2017  . BPH (benign prostatic hyperplasia) 01/10/2016  . Nocturia 01/10/2016  . Benign prostatic hyperplasia with urinary obstruction 01/10/2016  . AB (asthmatic bronchitis) 12/17/2014  . Carotid artery disease ([place]) 12/04/2014  . GERD (gastroesophageal reflux disease) 12/04/2014  . H/O respiratory system disease 12/04/2014  . H/O peptic ulcer 12/04/2014  . Hypercholesterolemia 12/04/2014  . Unstable angina pectoris ([place]) 12/04/2014  . Amnesia 12/04/2014  . Chronic obstructive pulmonary disease ([place]) 06/11/2014  . Cephalalgia 06/11/2014  . HTN (hypertension) 06/11/2014   PCP:  Baxter Hire, MD Pharmacy:   [place], [place]. [place] [address] Phone: [phone] Fax: [phone]  [facility] [place], [place] [address]  [place] [address] Phone: [phone] Fax: [phone]  [id number] - [place], [place] - [place] [address] [place] [address] [place] [address] Phone: [phone] Fax: [phone]     Social Determinants of Health ([place]) Interventions    Readmission Risk  Interventions No flowsheet data found.

## 2020-10-05 NOTE — ED Notes (Signed)
Secretary ordering pt an in-patient centrella bed. Will switch pt from stretcher to bed once available.

## 2020-10-05 NOTE — Evaluation (Signed)
Physical Therapy Evaluation Patient Details Name: Wesley Newton MRN: [id number] DOB: 04-13-1927 Today's Date: 10/05/2020   History of Present Illness  Patient is a 86 y.o. male with medical history significant of COPD, pulmonary fibrosis, with history of DVT, HLD, A. Fib, PAD BPH, admitted for acute on chronic respiratory failure after having worsening shortness of breath and hypotension over the last 4 days.   Clinical Impression  Patient is pleasant and cooperative during PT session. Patient reports he lives alone at [facility] and ambulates with a cane or rolling walker at baseline. Patient does reports recent decline in mobility with fatigue. Patient currently needs minimal assistance for bed mobility, sit to stand transfers, and is only able to ambulate a short distance in room with rolling walker. Activity tolerance limited by fatigue. Sp02 decreases to 85% on 4 L02 with standing/ambulation. Patient removed nasal canula briefly to blow his nose with Sp02 decreasing to 80%. 3-4 minute recovery period required with cues for pursed lip breathing techniques for Sp02 to increase back to 90-91%. At this time SNF would be recommended for discharge. Recommend to continue PT to address functional limitations listed below to maximize independence and return to PLOF.     Follow Up Recommendations SNF    Equipment Recommendations  None recommended by PT    Recommendations for Other Services       Precautions / Restrictions Precautions Precautions: Fall Restrictions Weight Bearing Restrictions: No      Mobility  Bed Mobility Overal bed mobility: Needs Assistance Bed Mobility: Supine to Sit;Sit to Supine     Supine to sit: Min assist Sit to supine: Min assist   General bed mobility comments: assistance for trunk support to sit upright and assistance for LE support to return to bed. increased time required due to fatigue with minimal activity   Transfers Overall transfer level: Needs  assistance Equipment used: Rolling walker (2 wheeled) Transfers: Sit to/from Stand Sit to Stand: Min assist         General transfer comment: Min A for safety.   Ambulation/Gait Ambulation/Gait assistance: Min assist Gait Distance (Feet): 3 Feet Assistive device: Rolling walker (2 wheeled) Gait Pattern/deviations: Narrow base of support;Decreased stride length Gait velocity: decreased   General Gait Details: Steadying assistance provided with short distance ambulation using rolling walker. Sp02 decreasing to 85% on 4 L02 with activity. Patient removed oxygen briefly to blow his nose in sitting position after walking and Sp02 decreased to 80%. Patient needs cues for pursed lip breathing and 3-4 minutes to recover to low 90's. Unable to ambulate further at this time due to fatigue.   Stairs            Wheelchair Mobility    Modified Rankin (Stroke Patients Only)       Balance Overall balance assessment: Needs assistance Sitting-balance support: Feet supported Sitting balance-Leahy Scale: Good     Standing balance support: Bilateral upper extremity supported;During functional activity Standing balance-Leahy Scale: Fair Standing balance comment: patient relying on rolling walker for support but is able demonstrate brief periods with steady static standing balance without UE support                              Pertinent Vitals/Pain Pain Assessment: Faces Faces Pain Scale: Hurts a little bit Pain Location: upper back pain  Pain Descriptors / Indicators: Sore Pain Intervention(s): Monitored during session    Home Living Family/patient expects to be discharged  to:: Private residence Living Arrangements: Alone Available Help at Discharge: Family;Available PRN/intermittently Type of Home:  (Patient lives alone at [facility] ) Home Access: Level entry       Home Equipment: Gilford Rile - 2 wheels;Cane - single point (oxygen )      Prior Function Level of  Independence: Independent with assistive device(s)         Comments: patient reports using cane or rolling walker for ambulation and uses oxygen reguarly at home. patient states he has occasional meals prepared at [facility] and has a housekeeper that helps once per month with cleaning. patient does occasionally drive per his report      Hand Dominance        Extremity/Trunk Assessment   Upper Extremity Assessment Upper Extremity Assessment: Defer to OT evaluation    Lower Extremity Assessment Lower Extremity Assessment: Generalized weakness       Communication   Communication: No difficulties;HOH  Cognition Arousal/Alertness: Awake/alert Behavior During Therapy: WFL for tasks assessed/performed Overall Cognitive Status: Within Functional Limits for tasks assessed                                        General Comments      Exercises Other Exercises Other Exercises: ankle pumps x 10 reps on BLE while supine in bed    Assessment/Plan    PT Assessment Patient needs continued PT services  PT Problem List Decreased strength;Decreased activity tolerance;Decreased balance;Decreased mobility;Cardiopulmonary status limiting activity;Decreased safety awareness       PT Treatment Interventions DME instruction;Stair training;Functional mobility training;Therapeutic activities;Balance training;Therapeutic exercise;Neuromuscular re-education    PT Goals (Current goals can be found in the Care Plan section)  Acute Rehab PT Goals Patient Stated Goal: to get stronger  PT Goal Formulation: With patient Time For Goal Achievement: 10/19/20 Potential to Achieve Goals: Good    Frequency Min 2X/week   Barriers to discharge Decreased caregiver support      Co-evaluation               AM-PAC PT "6 Clicks" Mobility  Outcome Measure Help needed turning from your back to your side while in a flat bed without using bedrails?: A Little Help needed moving  from lying on your back to sitting on the side of a flat bed without using bedrails?: A Little Help needed moving to and from a bed to a chair (including a wheelchair)?: A Little Help needed standing up from a chair using your arms (e.g., wheelchair or bedside chair)?: A Little Help needed to walk in hospital room?: A Little Help needed climbing 3-5 steps with a railing? : A Little 6 Click Score: 18    End of Session Equipment Utilized During Treatment: Oxygen Activity Tolerance: Patient limited by fatigue;Patient limited by lethargy Patient left: in bed;with call bell/phone within reach   PT Visit Diagnosis: Muscle weakness (generalized) (M62.81);Unsteadiness on feet (R26.81)    Time: 0626-9485 PT Time Calculation (min) (ACUTE ONLY): 30 min   Charges:   PT Evaluation $PT Eval Moderate Complexity: 1 Mod PT Treatments $Gait training: 8-22 mins        Wesley Newton, PT, MPT   Percell Locus 10/05/2020, 9:35 AM

## 2020-10-05 NOTE — ED Notes (Signed)
Pt given incentive spirometer and educated on use.

## 2020-10-05 NOTE — ED Notes (Signed)
Attempted report to floor x2. 

## 2020-10-05 NOTE — ED Notes (Signed)
200cc urine noted in canister.

## 2020-10-05 NOTE — ED Notes (Signed)
Granddaughter [name] who is also the POA updated on pt, asking for phone call from hospitalist for plan of care and update.  Informed her I would pass on the oncoming RN.  Informed her I was not qualified to discuss test results via phone, but that the provider could update her when round on the ED patients waiting for admission.

## 2020-10-05 NOTE — ED Notes (Signed)
Pt was wet. Linens and bed pad changed.

## 2020-10-05 NOTE — ED Notes (Signed)
Per [employer], pt ok to be switched to [facility]. Called resp therapy. Freda RT to come to bedside soon.

## 2020-10-05 NOTE — Evaluation (Signed)
Occupational Therapy Evaluation Patient Details Name: Wesley Newton MRN: [id number] DOB: Mar 26, 1927 Today's Date: 10/05/2020    History of Present Illness Patient is a 86 y.o. male with medical history significant of COPD, pulmonary fibrosis, with history of DVT, HLD, A. Fib, PAD BPH, admitted for acute on chronic respiratory failure after having worsening shortness of breath and hypotension over the last 4 days.    Clinical Impression   Wesley Newton presents in the ED today with generalized weakness, limited endurance, and shortness of breath, impacting his ability to safely and independently complete functional tasks. Wesley Newton currently lives in the independent living section of [facility], in a handicap-accessible apt with level entry. He has lived there alone since his wife died eight years ago. He has a daughter who lives close by and who visits with him each weekend. He has a son who lives ~ 42 miles away, as well as another daughter who lives out of state. Wesley Newton drives, does his own grocery shopping, and prepares his own meals (mostly simply dishes cooked in the microwave). He reports his appetite has declined considerably this past year. His daughter prepares his weekly pill box every weekend, and then Wesley Newton is able to take his medications throughout the week with no additional reminders. He manages dressing and bathing himself, but reports that "the last time I took a shower, it was tough." -- that it left him very fatigued. He struggles to thread his pant legs and fumbles with shirt buttons. A cleaner comes to his apt 1x/mo. Wesley Newton reports that, between her visits, he used to do some cleaning himself, but that of late he has not had the energy for that; he states that running the vacuum cleaner and mopping are "too tiring" for him. We discussed the possibility of his moving to the assisted living area within [facility], but Wesley Newton stated that he believed that, unless he can get  some "strength back," he would need more help than [facility] is able to offer. He states that at present he does not have enough strength "to be able to get out of bed," and he sees himself moving into the SNF section of [place]. Wesley Newton will benefit from ongoing skilled OT services while hospitalized, to address his weakness, endurance, and balance. After DC, if Wesley Newton goes to Assisted Living, recommend HHOT. DC to SNF within [facility] is likely.    Follow Up Recommendations  Home health OT;SNF    Equipment Recommendations  None recommended by OT    Recommendations for Other Services       Precautions / Restrictions Precautions Precautions: Fall Restrictions Weight Bearing Restrictions: No      Mobility Bed Mobility Overal bed mobility: Needs Assistance Bed Mobility: Supine to Sit;Sit to Supine     Supine to sit: Min assist Sit to supine: Min assist   General bed mobility comments: assistance for trunk support to sit upright and assistance for LE support to return to bed. increased time required due to fatigue with minimal activity   Transfers Overall transfer level: Needs assistance Equipment used: Rolling walker (2 wheeled) Transfers: Sit to/from Stand Sit to Stand: Min assist         General transfer comment: Min A for safety. Fatigues quickly    Balance Overall balance assessment: Needs assistance Sitting-balance support: Feet supported Sitting balance-Leahy Scale: Good     Standing balance support: Bilateral upper extremity supported;During functional activity Standing balance-Leahy Scale: [place] Standing  balance comment: patient relying on rolling walker for support but is able demonstrate brief periods with steady static standing balance without UE support                            ADL either performed or assessed with clinical judgement   ADL Overall ADL's : Needs assistance/impaired Eating/Feeding: Independent;Set up                Upper Body Dressing : Supervision/safety;Set up Upper Body Dressing Details (indicate cue type and reason): states occasionally has trouble with buttons Lower Body Dressing: Minimal assistance Lower Body Dressing Details (indicate cue type and reason): trouble threading pant legs                     Vision Baseline Vision/History: Wears glasses Wears Glasses: At all times Patient Visual Report: No change from baseline       Perception     Praxis      Pertinent Vitals/Pain Pain Assessment: Faces Faces Pain Scale: Hurts a little bit Pain Location: states he regularly has pain "all over." It is worst when he gets up in the AM. Pain Descriptors / Indicators: Sore Pain Intervention(s): Limited activity within patient's tolerance;Monitored during session     Hand Dominance     Extremity/Trunk Assessment Upper Extremity Assessment Upper Extremity Assessment: Generalized weakness   Lower Extremity Assessment Lower Extremity Assessment: Generalized weakness       Communication Communication Communication: No difficulties;HOH   Cognition Arousal/Alertness: Awake/alert Behavior During Therapy: WFL for tasks assessed/performed Overall Cognitive Status: Within Functional Limits for tasks assessed                                     General Comments  Wesley Newton appears very weak and SOB this AM    Exercises Exercises: Other exercises Other Exercises Other Exercises: ankle pumps x 10 reps on BLE while supine in bed  Other Exercises: educ re: role of OT, POC, DC options, EC strategies   Shoulder Instructions      Home Living Family/patient expects to be discharged to:: Private residence Living Arrangements: Alone Available Help at Discharge: Family;Available PRN/intermittently Type of Home: Apartment (within [facility]) Home Access: Level entry     Home Layout: One level     Bathroom Shower/Tub: Tub/shower unit     Bathroom Accessibility: Yes   Home  Equipment: Environmental consultant - 2 wheels;Cane - single point;Tub bench          Prior Functioning/Environment Level of Independence: Independent with assistive device(s)        Comments: patient reports using cane or rolling walker for ambulation and uses 2L oxygen reguarly at home. patient states he has occasional meals prepared at [facility] and has a housekeeper that helps once per month with cleaning. patient does occasionally drive per his report        OT Problem List: Decreased strength;Decreased activity tolerance;Impaired balance (sitting and/or standing)      OT Treatment/Interventions: Self-care/ADL training;DME and/or AE instruction;Therapeutic activities;Balance training;Therapeutic exercise;Energy conservation;Patient/family education    OT Goals(Current goals can be found in the care plan section) Acute Rehab OT Goals Patient Stated Goal: would like to be able to "breath good again" and to "get my strength back" OT Goal Formulation: With patient Time For Goal Achievement: 10/19/20 Potential to Achieve Goals: Good ADL Goals Wesley Newton Will  Perform Lower Body Bathing: with supervision Wesley Newton Will Perform Lower Body Dressing: with min guard assist (using AE/AD as needed) Wesley Newton Will Perform Tub/Shower Transfer: with min guard assist (using LRAD) Wesley Newton/caregiver will Perform Home Exercise Program: Increased strength;With theraband;With Supervision  OT Frequency: Min 2X/week   Barriers to D/C:            Co-evaluation              AM-PAC OT "6 Clicks" Daily Activity     Outcome Measure Help from another person eating meals?: None Help from another person taking care of personal grooming?: A Little Help from another person toileting, which includes using toliet, bedpan, or urinal?: A Little Help from another person bathing (including washing, rinsing, drying)?: A Lot Help from another person to put on and taking off regular upper body clothing?: A Little Help from another person to put  on and taking off regular lower body clothing?: A Lot 6 Click Score: 17   End of Session Equipment Utilized During Treatment: Rolling walker  Activity Tolerance: Patient limited by fatigue (limited by SOB) Patient left: in bed;with call bell/phone within reach  OT Visit Diagnosis: Unsteadiness on feet (R26.81);Muscle weakness (generalized) (M62.81)                Time: 1583-0940 OT Time Calculation (min): 29 min Charges:  OT General Charges $OT Visit: 1 Visit OT Evaluation $OT Eval Moderate Complexity: 1 Mod OT Treatments $Self Care/Home Management : 23-37 mins  Josiah Lobo, PhD, [place], OTR/L ascom [phone] 10/05/20, 10:53 AM

## 2020-10-05 NOTE — ED Notes (Signed)
Attempted report to floor x1. Name and number given.

## 2020-10-05 NOTE — Plan of Care (Signed)
°  Problem: Education: Goal: Knowledge of disease or condition will improve Outcome: Progressing   Problem: Respiratory: Goal: Ability to maintain a clear airway will improve Outcome: Progressing Goal: Levels of oxygenation will improve Outcome: Progressing

## 2020-10-05 NOTE — ED Notes (Signed)
Pt's purewick leaked onto bed, sheets replaced and new male purewick placed

## 2020-10-06 DIAGNOSIS — I469 Cardiac arrest, cause unspecified: Secondary | ICD-10-CM

## 2020-10-06 DIAGNOSIS — R0603 Acute respiratory distress: Secondary | ICD-10-CM

## 2020-10-06 DIAGNOSIS — J9621 Acute and chronic respiratory failure with hypoxia: Secondary | ICD-10-CM

## 2020-10-06 DIAGNOSIS — R0902 Hypoxemia: Secondary | ICD-10-CM

## 2020-10-06 LAB — BASIC METABOLIC PANEL
Anion gap: 10 (ref 5–15)
BUN: 24 mg/dL — ABNORMAL HIGH (ref 8–23)
CO2: 26 mmol/L (ref 22–32)
Calcium: 9.2 mg/dL (ref 8.9–10.3)
Chloride: 103 mmol/L (ref 98–111)
Creatinine, Ser: 1.26 mg/dL — ABNORMAL HIGH (ref 0.61–1.24)
GFR, Estimated: 50 mL/min — ABNORMAL LOW (ref >60)
Glucose, Bld: 164 mg/dL — ABNORMAL HIGH (ref 70–99)
Potassium: 4.5 mmol/L (ref 3.5–5.1)
Sodium: 139 mmol/L (ref 135–145)

## 2020-10-06 LAB — PROCALCITONIN: Procalcitonin: 0.25 ng/mL

## 2020-10-06 LAB — CBC
HCT: 31.4 % — ABNORMAL LOW (ref 39.0–52.0)
Hemoglobin: 10.6 g/dL — ABNORMAL LOW (ref 13.0–17.0)
MCH: 29 pg (ref 26.0–34.0)
MCHC: 33.8 g/dL (ref 30.0–36.0)
MCV: 86 fL (ref 80.0–100.0)
Platelets: 341 10*3/uL (ref 150–400)
RBC: 3.65 MIL/uL — ABNORMAL LOW (ref 4.22–5.81)
RDW: 14.5 % (ref 11.5–15.5)
WBC: 8.9 10*3/uL (ref 4.0–10.5)
nRBC: 0 % (ref 0.0–0.2)

## 2020-10-06 LAB — GLUCOSE, CAPILLARY
Glucose-Capillary: 168 mg/dL — ABNORMAL HIGH (ref 70–99)
Glucose-Capillary: 201 mg/dL — ABNORMAL HIGH (ref 70–99)

## 2020-10-06 LAB — MRSA PCR SCREENING: MRSA by PCR: NEGATIVE

## 2020-10-06 MED ORDER — HEPARIN (PORCINE) 25000 UT/250ML-% IV SOLN: 800.0000 [IU]/h | INTRAVENOUS | Status: DC

## 2020-10-06 MED ORDER — DOXYCYCLINE HYCLATE 100 MG PO TABS
100.0000 mg | ORAL_TABLET | Freq: Two times a day (BID) | ORAL | Status: DC
  Administered 2020-10-07 – 2020-10-08 (×3): 100 mg via ORAL
  Filled 2020-10-06 (×4): qty 1

## 2020-10-06 MED ORDER — HEPARIN BOLUS VIA INFUSION
4000.0000 [IU] | Freq: Once | INTRAVENOUS | Status: DC
  Filled 2020-10-06: qty 4000

## 2020-10-06 MED ORDER — LACTATED RINGERS IV SOLN: INTRAVENOUS | Status: DC

## 2020-10-06 MED ORDER — ONDANSETRON HCL 4 MG/2ML IJ SOLN
4.0000 mg | Freq: Four times a day (QID) | INTRAMUSCULAR | Status: DC | PRN
  Administered 2020-10-06: 8 mg via INTRAVENOUS
  Filled 2020-10-06: qty 4

## 2020-10-06 MED ORDER — CHLORHEXIDINE GLUCONATE CLOTH 2 % EX PADS
6.0000 | MEDICATED_PAD | Freq: Every day | CUTANEOUS | Status: DC
  Administered 2020-10-06 – 2020-10-07 (×2): 6 via TOPICAL

## 2020-10-06 MED ORDER — MORPHINE SULFATE (PF) 2 MG/ML IV SOLN
2.0000 mg | INTRAVENOUS | Status: DC | PRN
  Administered 2020-10-06 – 2020-10-07 (×7): 2 mg via INTRAVENOUS
  Filled 2020-10-06 (×7): qty 1

## 2020-10-06 MED ORDER — SODIUM CHLORIDE 0.9 % IV SOLN
2.0000 g | INTRAVENOUS | Status: DC
  Administered 2020-10-06 – 2020-10-08 (×3): 2 g via INTRAVENOUS
  Filled 2020-10-06: qty 20
  Filled 2020-10-06 (×2): qty 2

## 2020-10-06 NOTE — Progress Notes (Signed)
[place] visited pt. this AM shortly after his admission to ICU; pt. wearing O2 mask, lying in bed; says he does not feel well, is hoping to see his family if possible; pt.'s dtr. is en route from [facility] and is expected to arrive tomorrow.  Per pt. request, [place] prayed for pt. for sense of divine presence and peace in hospital room. At time of visit no family present, but as morning and afternoon progressed, several family members arrived and spent time w/pt. at bedside.  Betances checked in with family in afternoon: no needs at this time, dtr. still expected to arrive tonight and visit tomorrow hopefully.  CH remains available as needed.

## 2020-10-06 NOTE — ED Provider Notes (Signed)
[facility] Department of Emergency Medicine   Code Blue CONSULT NOTE  Chief Complaint: Cardiac arrest/unresponsive   Level V Caveat: Unresponsive  History of present illness: I was contacted by the hospital for a CODE BLUE cardiac arrest upstairs and presented to the patient's bedside.   Patient was undergoing active CPR with BVM assisted ventilations. Last seen normal this AM. He reportedly was in PEA arrest, has received epi x 2 prior to my arrival.  ROS: Unable to obtain, Level V caveat  Scheduled Meds: . amiodarone  100 mg Oral Daily  . atorvastatin  20 mg Oral q1800  . budesonide  0.5 mg Nebulization Daily  . Chlorhexidine Gluconate Cloth  6 each Topical Daily  . doxycycline  100 mg Oral Q12H  . finasteride  5 mg Oral Daily  . gabapentin  100 mg Oral q morning - 10a  . gabapentin  200 mg Oral QHS  . ipratropium-albuterol  3 mL Inhalation QID  . methylPREDNISolone (SOLU-MEDROL) injection  40 mg Intravenous Q12H  . midodrine  10 mg Oral TID WC  . pantoprazole  40 mg Oral BID  . tamsulosin  0.4 mg Oral QPC supper   Continuous Infusions: . cefTRIAXone (ROCEPHIN)  IV 2 g (10/06/20 0818)   PRN Meds:.acetaminophen **OR** acetaminophen, albuterol, albuterol, LORazepam, morphine injection, ondansetron (ZOFRAN) IV, polyvinyl alcohol, sodium chloride Past Medical History:  Diagnosis Date  . Asthma   . BPH (benign prostatic hyperplasia)   . Bronchitis   . Cancer ([place])   . COPD (chronic obstructive pulmonary disease) ([place])   . DVT of leg (deep venous thrombosis) ([place])   . Elevated PSA   . Emphysema lung ([place])   . Emphysema of lung ([place])   . Hyperlipidemia   . Nocturia   . Pulmonary fibrosis ([place])   . Pulmonary fibrosis ([place])   . Skin cancer    Past Surgical History:  Procedure Laterality Date  . CATARACT EXTRACTION    . HERNIA REPAIR    . TONSILLECTOMY     Social History   Socioeconomic History  . Marital status: Widowed    Spouse name: Not  on file  . Number of children: Not on file  . Years of education: Not on file  . Highest education level: Not on file  Occupational History  . Not on file  Tobacco Use  . Smoking status: Former Research scientist (life sciences)  . Smokeless tobacco: Never Used  . Tobacco comment: quit 40 years  Substance and Sexual Activity  . Alcohol use: No    Alcohol/week: 0.0 standard drinks  . Drug use: No  . Sexual activity: Not on file  Other Topics Concern  . Not on file  Social History Narrative  . Not on file   Social Determinants of Health   Financial Resource Strain:   . Difficulty of Paying Living Expenses: Not on file  Food Insecurity:   . Worried About Charity fundraiser in the Last Year: Not on file  . Ran Out of Food in the Last Year: Not on file  Transportation Needs:   . Lack of Transportation (Medical): Not on file  . Lack of Transportation (Non-Medical): Not on file  Physical Activity:   . Days of Exercise per Week: Not on file  . Minutes of Exercise per Session: Not on file  Stress:   . Feeling of Stress : Not on file  Social Connections:   . Frequency of Communication with Friends and Family: Not on file  .  Frequency of Social Gatherings with Friends and Family: Not on file  . Attends Religious Services: Not on file  . Active Member of Clubs or Organizations: Not on file  . Attends Archivist Meetings: Not on file  . Marital Status: Not on file  Intimate Partner Violence:   . Fear of Current or Ex-Partner: Not on file  . Emotionally Abused: Not on file  . Physically Abused: Not on file  . Sexually Abused: Not on file   Allergies  Allergen Reactions  . Amoxicillin Other (See Comments)    DID THE REACTION INVOLVE: Swelling of the face/tongue/throat, SOB, or low BP? Unknown Sudden or severe rash/hives, skin peeling, or the inside of the mouth or nose? Unknown Did it require medical treatment? Unknown When did it last happen?Unknown If all above answers are "NO", may proceed  with cephalosporin use.   . Codeine Nausea And Vomiting  . Rapaflo [Silodosin] Other (See Comments)    Reaction:  Unknown   . Tramadol Nausea And Vomiting and Other (See Comments)    Pt states that this medication makes him feel crazy.    . Trimethoprim Other (See Comments)    Reaction: unknown    Last set of Vital Signs (not current) Vitals:   10/06/20 0900 10/06/20 1200  BP: (!) 136/45   Pulse:    Resp: (!) 34   Temp: (!) 97.5 F (36.4 C) 97.7 F (36.5 C)  SpO2: 100%       Physical Exam Gen: unresponsive Cardiovascular: pulseless  Resp: apneic. Breath sounds equal bilaterally with bagging  Abd: nondistended  Neuro: GCS 3, unresponsive to pain  HEENT: No blood in posterior pharynx, gag reflex absent  Neck: No crepitus  Musculoskeletal: No deformity  Skin: warm  Procedures (when applicable, including Critical Care time): Procedures   MDM / Assessment and Plan Shortly after my arrival, pt had ROSC to sinus rhythm. I checked pulses which are symmetric b/l. He was initially moaning then answering questions appropriately. Main complaint is chest wall discomfort likely 2/2 his compressions .Placed pt on HFNC and will admit to ICU. At this time, does not need intubation. Hospitalist at bedside. CBG wnl. BP, HR normal. EKG pending.    Duffy Bruce, MD 10/06/20 1740

## 2020-10-06 NOTE — Progress Notes (Signed)
OT Cancellation Note  Patient Details Name: Wesley Newton MRN: [id number] DOB: 1927/05/12   Cancelled Treatment:    Reason Eval/Treat Not Completed: Other (comment)  Upon chart review this PM, noted that pt t/f'ed to ICU d/t higher level of care needs. Per department protocol, will complete OT order at this time. Thank you.  Gerrianne Scale, [place], OTR/L ascom [phone] 10/06/20, 2:48 PM

## 2020-10-06 NOTE — Progress Notes (Signed)
Patient ID: Wesley Newton, male   DOB: 25-Mar-1927, 86 y.o.   MRN: [id number]  Code note  Called because patient became unresponsive. Rapid response called We were unable to get a pulse and CODE BLUE called.  CPR was given for around 3 minutes and 1 epinephrine was given.  Patient did have a blood pressure and a pulse.  Patient started talking so ER physician did not want to intubate currently.  We will transfer to the ICU for further management.  Dr Loletha Grayer

## 2020-10-06 NOTE — Progress Notes (Signed)
Patient stated he is feeling worse this morning than he has the past few days. Patient complains of upset stomach . Bowels sounded hyperactive upon auscultation. Patient also stated that while falling asleep he had a sudden sensation that he was falling forward which awoke him.

## 2020-10-06 NOTE — Progress Notes (Signed)
OT Cancellation Note  Patient Details Name: Wesley Newton MRN: [id number] DOB: 18-Oct-1927   Cancelled Treatment:    Reason Eval/Treat Not Completed: Medical issues which prohibited therapy  Code Blue called this AM. Will hold therapy services at this time. Will resume at later date/time if appropriate.   Gerrianne Scale, [place], OTR/L ascom [phone] 10/06/20, 8:33 AM

## 2020-10-06 NOTE — Progress Notes (Signed)
Initial Nutrition Assessment  DOCUMENTATION CODES:   Not applicable  INTERVENTION:  No appropriate nutrition interventions at this time as patient is NPO with plans to transition to comfort care once family arrives. Will continue to monitor outcome of discussions regarding goals of care.  NUTRITION DIAGNOSIS:   Inadequate oral intake related to inability to eat as evidenced by NPO status.  GOAL:   Patient will meet greater than or equal to 90% of their needs  MONITOR:   Diet advancement, Labs, Weight trends, I & O's  REASON FOR ASSESSMENT:   Malnutrition Screening Tool    ASSESSMENT:   86 year old male with PMHx of emphysema, pulmonary fibrosis, COPD, HLD, asthma, A-fib, PAD, hx DVT, BPH admitted with COPD exacerbation.   This morning patient suffered acute cardiac arrest from acute asphyxiation of gastric contents and aspiration PNA requiring transfer to ICU. Per discussion on rounds patient is now NPO and is too high risk of aspiration for PO intake. Unable to meet with patient at bedside. At first attempt family was discussing with provider in room. At second attempt family was praying with pastor at bedside. Per review of chart plan is for transition to comfort care once family arrives. Pending Palliative Medicine consult.  Per review of weight history in chart patient's weight appears stable between 68-70 kg. He is currently documented to be 69.4 kg (153 lbs).   Medications reviewed and include: doxycycline, Solu-Medrol 40 mg Q12hrs IV, Protonix, ceftriaxone. Patient unable to take PO medications at this time.  Labs reviewed: CBG 168-201, BUN 24, Creatinine 1.26.  Unable to determine if patient meets criteria for malnutrition at this time.  NUTRITION - FOCUSED PHYSICAL EXAM:  Unable to complete at this time.  Diet Order:   Diet Order            Diet Heart Room service appropriate? Yes; Fluid consistency: Thin  Diet effective now                EDUCATION  NEEDS:   No education needs have been identified at this time  Skin:  Skin Assessment: Reviewed RN Assessment  Last BM:  10/05/2020 per chart  Height:   Ht Readings from Last 1 Encounters:  10/04/20 5\' 6"  (1.676 m)   Weight:   Wt Readings from Last 1 Encounters:  10/04/20 69.4 kg   Ideal Body Weight:  64.5 kg  BMI:  Body mass index is 24.69 kg/m.  Estimated Nutritional Needs:   Kcal:  1800-2000  Protein:  90-100 grams  Fluid:  1.7 L/day  Jacklynn Barnacle, MS, RD, LDN Pager number available on Amion

## 2020-10-06 NOTE — Consult Note (Signed)
Name: Wesley Newton MRN: [id number] DOB: 1927-10-22     CONSULTATION DATE: 10/04/2020  REFERRING MD :  Earleen Newport  CHIEF COMPLAINT:  Acute cardiac arrest     HISTORY OF PRESENT ILLNESS:    ADMITTED 10/17 86 y.o. male with medical history significant of COPD, pulmonary fibrosis, History of DVT, HLD, A. Fib, PAD BPH  Presented with shortness of breath and hypertension for past 4 days usually he is on 2 L of nasal cannula at home but had to increase it after 3.  He checks his pulse ox this morning was in the 60s.  No associated chest pain Reports he feels like his breathing is tight  10/19 Called because patient became unresponsive. Rapid response called We were unable to get a pulse and CODE BLUE called.  CPR was given for around 3 minutes and 1 epinephrine was given.  Patient did have a blood pressure and a pulse.  Patient started talking  Patient with increased WOB, confused suffocating and struggling to breathe Placed on 100% NRB mask  Patient with very poor prognosis  critically ill High risk for cardiac arrest and death   CT chest 03-01-2019 Severe ILD b/l C/w severe fibrosis   PAST MEDICAL HISTORY :   has a past medical history of Asthma, BPH (benign prostatic hyperplasia), Bronchitis, Cancer ([place]), COPD (chronic obstructive pulmonary disease) ([place]), DVT of leg (deep venous thrombosis) ([place]), Elevated PSA, Emphysema lung ([place]), Emphysema of lung ([place]), Hyperlipidemia, Nocturia, Pulmonary fibrosis ([place]), Pulmonary fibrosis ([place]), and Skin cancer.  has a past surgical history that includes Cataract extraction; Hernia repair; and Tonsillectomy. Prior to Admission medications   Medication Sig Start Date End Date Taking? Authorizing Provider  albuterol (PROVENTIL HFA;VENTOLIN HFA) 108 (90 Base) MCG/ACT inhaler Inhale 2 puffs into the lungs every 6 (six) hours as needed for wheezing or shortness of breath.   Yes [provider]  amiodarone (PACERONE) 200  MG tablet Take 1 tablet (200 mg total) by mouth 2 (two) times daily. Patient taking differently: Take 100 mg by mouth daily.  05/07/19  Yes Dustin Flock, MD  apixaban (ELIQUIS) 2.5 MG TABS tablet Take 1 tablet (2.5 mg total) by mouth 2 (two) times daily. 05/07/19  Yes Dustin Flock, MD  atorvastatin (LIPITOR) 20 MG tablet Take 1 tablet (20 mg total) by mouth daily at 6 PM. 05/07/19  Yes Dustin Flock, MD  azelastine (ASTELIN) 0.1 % nasal spray Place 1 spray into both nostrils daily. 01/10/19  Yes [provider]  budesonide (PULMICORT) 0.5 MG/2ML nebulizer solution Take 0.5 mg by nebulization daily.   Yes [provider]  finasteride (PROSCAR) 5 MG tablet Take 1 tablet (5 mg total) by mouth daily. 12/24/19  Yes McGowan, Larene [place] A, PA-C  fluticasone (FLONASE) 50 MCG/ACT nasal spray Place 2 sprays into both nostrils daily. 09/23/20  Yes [provider]  gabapentin (NEURONTIN) 100 MG capsule Take 100-200 mg by mouth 2 (two) times daily. Take 1 capsule (100mg ) by mouth every morning and 2 capsules (200mg ) by mouth every night. 02/04/19  Yes [provider]  ipratropium-albuterol (DUONEB) 0.5-2.5 (3) MG/3ML SOLN Inhale 3 mLs into the lungs 4 (four) times daily. 01/14/19  Yes [provider]  LORazepam (ATIVAN) 0.5 MG tablet Take 1 tablet (0.5 mg total) by mouth daily as needed for anxiety. Patient taking differently: Take 0.5 mg by mouth every 8 (eight) hours as needed for anxiety.  05/07/19  Yes Dustin Flock, MD  midodrine (PROAMATINE) 10 MG tablet Take 1  tablet (10 mg total) by mouth 3 (three) times daily with meals. 05/07/19  Yes Dustin Flock, MD  Multiple Vitamins-Minerals (OCUVITE ADULT 50+ PO) Take 1 capsule by mouth daily.   Yes [provider]  naphazoline-glycerin (CLEAR EYES REDNESS) 0.012-0.2 % SOLN Place 1-2 drops into both eyes 4 (four) times daily as needed for eye irritation. 12/21/18  Yes Sainani, Belia Heman, MD  pantoprazole (PROTONIX) 40  MG tablet Take 1 tablet (40 mg total) by mouth 2 (two) times daily. 05/07/19  Yes Dustin Flock, MD  predniSONE (DELTASONE) 10 MG tablet Take 10 mg by mouth daily. 09/14/20  Yes [provider]  tamsulosin (FLOMAX) 0.4 MG CAPS capsule TAKE 2 CAPSULES BY MOUTH EVERY DAY Patient taking differently: Take 0.4 mg by mouth daily after supper.  05/13/20  Yes Vaillancourt, Samantha, PA-C  triamcinolone ointment (KENALOG) 0.1 % Apply 1 application topically as directed. Apply 2-3 times daily 09/08/20  Yes [provider]   Allergies  Allergen Reactions  . Amoxicillin Other (See Comments)    DID THE REACTION INVOLVE: Swelling of the face/tongue/throat, SOB, or low BP? Unknown Sudden or severe rash/hives, skin peeling, or the inside of the mouth or nose? Unknown Did it require medical treatment? Unknown When did it last happen?Unknown If all above answers are "NO", may proceed with cephalosporin use.   . Codeine Nausea And Vomiting  . Rapaflo [Silodosin] Other (See Comments)    Reaction:  Unknown   . Tramadol Nausea And Vomiting and Other (See Comments)    Pt states that this medication makes him feel crazy.    . Trimethoprim Other (See Comments)    Reaction: unknown    FAMILY HISTORY:  family history includes Heart Problems in his brother. SOCIAL HISTORY:  reports that he has quit smoking. He has never used smokeless tobacco. He reports that he does not drink alcohol and does not use drugs.  REVIEW OF SYSTEMS:   Unable to obtain due to critical illness      Estimated body mass index is 24.69 kg/m as calculated from the following:   Height as of this encounter: 5\' 6"  (1.676 m).   Weight as of this encounter: 69.4 kg.    VITAL SIGNS: Temp:  [97.6 F (36.4 C)-99.3 F (37.4 C)] 97.6 F (36.4 C) (10/18 2350) Pulse Rate:  [75-99] 75 (10/18 2350) Resp:  [17-31] 18 (10/18 2350) BP: (131-158)/(47-86) 148/86 (10/18 2350) SpO2:  [70 %-100 %] 97 % (10/19 0751)   I/O  last 3 completed shifts: In: -  Out: 1275 [Urine:1275] No intake/output data recorded.   SpO2: 97 % O2 Flow Rate (L/min): 8 L/min (weaned per spo2)   Physical Examination:  GENERAL:critically ill appearing, +resp distress HEAD: Normocephalic, atraumatic.  EYES: Pupils equal, round, reactive to light.  No scleral icterus.  MOUTH: Moist mucosal membrane. NECK: Supple. No JVD.  PULMONARY: +rhonchi,  CARDIOVASCULAR: S1 and S2. Regular rate and rhythm. No murmurs, rubs, or gallops.  GASTROINTESTINAL: Soft, nontender, -distended.  Positive bowel sounds.  MUSCULOSKELETAL: No swelling, clubbing, or edema.  NEUROLOGIC: lethargic and confused SKIN:intact,warm,dry   MEDICATIONS: I have reviewed all medications and confirmed regimen as documented   CULTURE RESULTS   Recent Results (from the past 240 hour(s))  Culture, blood (Routine x 2)     Status: None (Preliminary result)   Collection Time: 10/04/20  1:47 PM   Specimen: BLOOD  Result Value Ref Range Status   Specimen Description BLOOD RFA  Final   Special Requests  Final    BOTTLES DRAWN AEROBIC AND ANAEROBIC Blood Culture adequate volume   Culture   Final    NO GROWTH 2 DAYS Performed at [facility], [address] [place], [place] [address]    Report Status PENDING  Incomplete  Culture, blood (Routine x 2)     Status: None (Preliminary result)   Collection Time: 10/04/20  5:32 PM   Specimen: BLOOD  Result Value Ref Range Status   Specimen Description BLOOD RAC  Final   Special Requests   Final    BOTTLES DRAWN AEROBIC AND ANAEROBIC Blood Culture results may not be optimal due to an excessive volume of blood received in culture bottles   Culture   Final    NO GROWTH 2 DAYS Performed at [facility], [address] [place], [place] [address]    Report Status PENDING  Incomplete  Respiratory Panel by RT PCR (Flu A&B, Covid) - Nasopharyngeal Swab     Status: None   Collection Time: 10/04/20  9:13 PM    Specimen: Nasopharyngeal Swab  Result Value Ref Range Status   SARS Coronavirus 2 by RT PCR NEGATIVE NEGATIVE Final    Comment: (NOTE) SARS-CoV-2 target nucleic acids are NOT DETECTED.  The SARS-CoV-2 RNA is generally detectable in upper respiratoy specimens during the acute phase of infection. The lowest concentration of SARS-CoV-2 viral copies this assay can detect is 131 copies/mL. A negative result does not preclude SARS-Cov-2 infection and should not be used as the sole basis for treatment or other patient management decisions. A negative result may occur with  improper specimen collection/handling, submission of specimen other than nasopharyngeal swab, presence of viral mutation(s) within the areas targeted by this assay, and inadequate number of viral copies (<131 copies/mL). A negative result must be combined with clinical observations, patient history, and epidemiological information. The expected result is Negative.  Fact Sheet for Patients:  PinkCheek.be  Fact Sheet for Healthcare Providers:  GravelBags.it  This test is no t yet approved or cleared by the [place] FDA and  has been authorized for detection and/or diagnosis of SARS-CoV-2 by FDA under an Emergency Use Authorization (EUA). This EUA will remain  in effect (meaning this test can be used) for the duration of the COVID-19 declaration under Section 564(b)(1) of the Act, 21 U.S.C. section 360bbb-3(b)(1), unless the authorization is terminated or revoked sooner.     Influenza A by PCR NEGATIVE NEGATIVE Final   Influenza B by PCR NEGATIVE NEGATIVE Final    Comment: (NOTE) The Xpert Xpress SARS-CoV-2/FLU/RSV assay is intended as an aid in  the diagnosis of influenza from Nasopharyngeal swab specimens and  should not be used as a sole basis for treatment. Nasal washings and  aspirates are unacceptable for Xpert Xpress SARS-CoV-2/FLU/RSV   testing.  Fact Sheet for Patients: PinkCheek.be  Fact Sheet for Healthcare Providers: GravelBags.it  This test is not yet approved or cleared by the [place] FDA and  has been authorized for detection and/or diagnosis of SARS-CoV-2 by  FDA under an Emergency Use Authorization (EUA). This EUA will remain  in effect (meaning this test can be used) for the duration of the  Covid-19 declaration under Section 564(b)(1) of the Act, 21  U.S.C. section 360bbb-3(b)(1), unless the authorization is  terminated or revoked. Performed at [facility], [address] [place],  [address]         CMP Latest Ref Rng & Units 10/06/2020 10/05/2020 10/04/2020  Glucose 70 -  99 mg/dL 164(H) 100(H) 139(H)  BUN 8 - 23 mg/dL 24(H) 24(H) 30(H)  Creatinine 0.61 - 1.24 mg/dL 1.26(H) 1.49(H) 1.63(H)  Sodium 135 - 145 mmol/L 139 138 137  Potassium 3.5 - 5.1 mmol/L 4.5 4.5 4.2  Chloride 98 - 111 mmol/L 103 105 104  CO2 22 - 32 mmol/L 26 24 26   Calcium 8.9 - 10.3 mg/dL 9.2 9.0 9.1  Total Protein 6.5 - 8.1 g/dL - 6.5 7.0  Total Bilirubin 0.3 - 1.2 mg/dL - 1.0 0.7  Alkaline Phos 38 - 126 U/L - 53 61  AST 15 - 41 U/L - 24 26  ALT 0 - 44 U/L - 19 22      IMAGING    US Venous Img Lower Bilateral (DVT)  Result Date: 10/05/2020 CLINICAL DATA:  Elevated D-dimer. EXAM: BILATERAL LOWER EXTREMITY VENOUS DOPPLER ULTRASOUND TECHNIQUE: Gray-scale sonography with compression, as well as color and duplex ultrasound, were performed to evaluate the deep venous system(s) from the level of the common femoral vein through the popliteal and proximal calf veins. COMPARISON:  None. FINDINGS: VENOUS Normal compressibility of the common femoral, superficial femoral, and popliteal veins, as well as the visualized calf veins. Visualized portions of profunda femoral vein and great saphenous vein unremarkable. No filling defects to suggest DVT on  grayscale or color Doppler imaging. Doppler waveforms show normal direction of venous flow, normal respiratory plasticity and response to augmentation. Limited views of the contralateral common femoral vein are unremarkable. OTHER None. Limitations: none IMPRESSION: Negative exam. Electronically Signed   By: Inge Rise M.D.   On: 10/05/2020 12:40   ECHOCARDIOGRAM COMPLETE  Result Date: 10/05/2020    ECHOCARDIOGRAM REPORT   Patient Name:   KWEKU STANKEY Date of Exam: 10/05/2020 Medical Rec #:  [id number]       Height:       66.0 in Accession #:    [id number]      Weight:       153.0 lb Date of Birth:  December 28, 1926       BSA:          1.785 m Patient Age:    62 years        BP:           141/59 mmHg Patient Gender: M               HR:           76 bpm. Exam Location:  [facility] Procedure: 2D Echo, Cardiac Doppler and Color Doppler Indications:     Dyspnea 786.09  History:         Patient has prior history of Echocardiogram examinations, most                  recent 03/04/2019. COPD; Risk Factors:Dyslipidemia. DVT.  Sonographer:     Sherrie Sport RDCS (AE) Referring Phys:  [place] Diagnosing Phys: Isaias Cowman MD  Sonographer Comments: Suboptimal apical window. Image acquisition challenging due to COPD. IMPRESSIONS  1. Left ventricular ejection fraction, by estimation, is 60 to 65%. The left ventricle has normal function. The left ventricle has no regional wall motion abnormalities. Left ventricular diastolic parameters are consistent with Grade I diastolic dysfunction (impaired relaxation).  2. Right ventricular systolic function is normal. The right ventricular size is normal.  3. The mitral valve is normal in structure. Mild mitral valve regurgitation. No evidence of mitral stenosis.  4. The aortic valve is normal in structure. Aortic valve regurgitation is  not visualized. No aortic stenosis is present.  5. The inferior vena cava is normal in size with greater than 50% respiratory  variability, suggesting right atrial pressure of 3 mmHg. FINDINGS  Left Ventricle: Left ventricular ejection fraction, by estimation, is 60 to 65%. The left ventricle has normal function. The left ventricle has no regional wall motion abnormalities. The left ventricular internal cavity size was normal in size. There is  no left ventricular hypertrophy. Left ventricular diastolic parameters are consistent with Grade I diastolic dysfunction (impaired relaxation). Right Ventricle: The right ventricular size is normal. No increase in right ventricular wall thickness. Right ventricular systolic function is normal. Left Atrium: Left atrial size was normal in size. Right Atrium: Right atrial size was normal in size. Pericardium: There is no evidence of pericardial effusion. Mitral Valve: The mitral valve is normal in structure. Mild mitral valve regurgitation. No evidence of mitral valve stenosis. Tricuspid Valve: The tricuspid valve is normal in structure. Tricuspid valve regurgitation is mild . No evidence of tricuspid stenosis. Aortic Valve: The aortic valve is normal in structure. Aortic valve regurgitation is not visualized. No aortic stenosis is present. Aortic valve mean gradient measures 7.0 mmHg. Aortic valve peak gradient measures 11.2 mmHg. Aortic valve area, by VTI measures 2.25 cm. Pulmonic Valve: The pulmonic valve was normal in structure. Pulmonic valve regurgitation is not visualized. No evidence of pulmonic stenosis. Aorta: The aortic root is normal in size and structure. Venous: The inferior vena cava is normal in size with greater than 50% respiratory variability, suggesting right atrial pressure of 3 mmHg. IAS/Shunts: No atrial level shunt detected by color flow Doppler.  LEFT VENTRICLE PLAX 2D LVIDd:         3.90 cm  Diastology LVIDs:         2.01 cm  LV e' medial:    8.81 cm/s LV PW:         1.20 cm  LV E/e' medial:  10.4 LV IVS:        1.08 cm  LV e' lateral:   10.90 cm/s LVOT diam:     2.00 cm  LV  E/e' lateral: 8.4 LV SV:         82 LV SV Index:   46 LVOT Area:     3.14 cm  RIGHT VENTRICLE RV Basal diam:  3.95 cm RV S prime:     19.80 cm/s TAPSE (M-mode): 3.8 cm LEFT ATRIUM             Index       RIGHT ATRIUM           Index LA diam:        3.40 cm 1.91 cm/m  RA Area:     23.00 cm LA Vol (A2C):   62.8 ml 35.19 ml/m RA Volume:   69.60 ml  39.00 ml/m LA Vol (A4C):   35.9 ml 20.12 ml/m LA Biplane Vol: 49.3 ml 27.63 ml/m  AORTIC VALVE                    PULMONIC VALVE AV Area (Vmax):    1.72 cm     PV Vmax:        0.90 m/s AV Area (Vmean):   1.94 cm     PV Peak grad:   3.2 mmHg AV Area (VTI):     2.25 cm     RVOT Peak grad: 5 mmHg AV Vmax:  167.33 cm/s AV Vmean:          118.033 cm/s AV VTI:            0.364 m AV Peak Grad:      11.2 mmHg AV Mean Grad:      7.0 mmHg LVOT Vmax:         91.60 cm/s LVOT Vmean:        73.000 cm/s LVOT VTI:          0.261 m LVOT/AV VTI ratio: 0.72  AORTA Ao Root diam: 2.70 cm MITRAL VALVE                TRICUSPID VALVE MV Area (PHT): 3.77 cm     TR Peak grad:   11.8 mmHg MV Decel Time: 201 msec     TR Vmax:        172.00 cm/s MV E velocity: 91.70 cm/s MV A velocity: 117.00 cm/s  SHUNTS MV E/A ratio:  0.78         Systemic VTI:  0.26 m                             Systemic Diam: 2.00 cm Isaias Cowman MD Electronically signed by Isaias Cowman MD Signature Date/Time: 10/05/2020/1:41:22 PM    Final         ASSESSMENT AND PLAN SYNOPSIS  86 yo male with severe end stage pulm fibrosis with acute cardiac arrest from acute asphyxiation ofr gastric contents and aspiration pneumonia   Severe ACUTE Hypoxic and Hypercapnic Respiratory Failure High risk for intubation/death  ACUTE DIASTOLIC CARDIAC FAILURE-  -oxygen as needed -Lasix as tolerated   ACUTE KIDNEY INJURY/Renal Failure -continue Foley Catheter-assess need -Avoid nephrotoxic agents -Follow urine output, BMP -Ensure adequate renal perfusion, optimize oxygenation -Renal dose  medications     NEUROLOGY confused    CARDIAC ICU monitoring  ID -continue IV abx as prescibed -follow up cultures  GI GI PROPHYLAXIS as indicated  NUTRITIONAL STATUS DIET-->NPO Constipation protocol as indicated   ENDO - will use ICU hypoglycemic\Hyperglycemia protocol if needed    ELECTROLYTES -follow labs as needed -replace as needed -pharmacy consultation and following    DVT/GI PRX ordered and assessed TRANSFUSIONS AS NEEDED MONITOR FSBS I Assessed the need for Labs I Assessed the need for Foley I Assessed the need for Central Venous Line Family Discussion when available I Assessed the need for Mobilization I made an Assessment of medications to be adjusted accordingly Safety Risk assessment Completed  CASE DISCUSSED IN MULTIDISCIPLINARY ROUNDS WITH ICU TEAM   Critical Care Time devoted to patient care services described in this note is 78 minutes.   Overall, patient is critically ill, prognosis is guarded.  Patient with Multiorgan failure and at high risk for cardiac arrest and death.   Recommend DNR/DNI status    Raisa Ditto Patricia Pesa, M.D.  [facility]  Medical Director [place] Director [facility] Cardio-Pulmonary Department

## 2020-10-06 NOTE — Progress Notes (Signed)
GOALS OF CARE DISCUSSION  The Clinical status was relayed to family in detail.  Updated and notified of patients medical condition.  Patient remains unresponsive and will not open eyes to command.   Upon assessment his breath sounds are course crackles with significant secretions to oral pharyngeal region.  Nasopharyngeal suction produced copious sanguineous secretions.    Patient is having a weak cough and struggling to remove secretions.   patient with increased WOB and using accessory muscles to breathe Explained to family course of therapy and the modalities     Patient with Progressive multiorgan failure with very low chance of meaningful recovery despite all aggressive and optimal medical therapy. Patient is in the Dying  Process associated with Suffering.  Family understands the situation.  They have consented and agreed to DNR/DNI and would like to proceed with Comfort care measures when family arrives, however, will proceed with comfort care measures if patient suffers more  Family are satisfied with Plan of action and management. All questions answered  Additional CC time 32 mins   Brenley Priore Patricia Pesa, M.D.  [facility]  Medical Director [place] Director [facility] Cardio-Pulmonary Department

## 2020-10-06 NOTE — Progress Notes (Signed)
° ° ° °  Referral received for Wesley Newton :goals of care discussion. Chart reviewed and updates received from RN.   Some family is at the bedside. Reports that family is awaiting other family to arrive with plans to transition to full comfort/EOL care with anticipated hospital death. Patient is unresponsive on NRB.   RN reports Dr. Mortimer Fries had a long discussion with family. They have agreed to DNR with plan in place. No further aggressive interventions.   Per RN no needs at this time from Palliative. Verbalized understanding and advised if need to engage please do not hesitate to contact me via secure chat or by page.   Alda Lea, AGPCNP-BC Palliative Medicine Team  Phone: [phone] Pager: [phone] Amion: N. [place]   NO CHARGE

## 2020-10-06 NOTE — Progress Notes (Signed)
Patient ID: Wesley Newton, male   DOB: 08-12-1927, 86 y.o.   MRN: [id number]  Family waiting for other family members to come in before deciding on full comfort care measures.  Patient is now a DO NOT RESUSCITATE.  Case discussed with family at the bedside and granddaughter on the phone.  Dr Loletha Grayer

## 2020-10-06 NOTE — Progress Notes (Signed)
Patient ID: Wesley Newton, male   DOB: Dec 31, 1926, 86 y.o.   MRN: [id number] [facility] PROGRESS NOTE  Wesley Newton:[id number] DOB: 09-Jan-1927 DOA: 10/04/2020 PCP: Baxter Hire, MD  HPI/Subjective: Patient seen this morning and was answering questions and feeling a little bit better.  Still little short of breath but was not working to breathe like he was yesterday.  Some cough.  Some lower abdominal pain.  Little later he became unresponsive and we started CPR because he had no pulse.  1 epinephrine was given and patient regained pulse and then regained mental status and put back on high flow nasal cannula.  Patient will be transferred over to the ICU.  Objective: Vitals:   10/06/20 0745 10/06/20 0751  BP:    Pulse:    Resp:    Temp:    SpO2: 99% 97%    Intake/Output Summary (Last 24 hours) at 10/06/2020 0846 Last data filed at 10/06/2020 0554 Gross per 24 hour  Intake --  Output 725 ml  Net -725 ml   Filed Weights   10/04/20 1344  Weight: 69.4 kg    ROS: Review of Systems  Respiratory: Positive for cough and shortness of breath.   Cardiovascular: Negative for chest pain.  Gastrointestinal: Positive for abdominal pain.   Exam: Physical Exam HENT:     Head: Normocephalic.     Mouth/Throat:     Pharynx: No oropharyngeal exudate.  Eyes:     General: Lids are normal.     Conjunctiva/sclera: Conjunctivae normal.  Cardiovascular:     Rate and Rhythm: Normal rate and regular rhythm.     Heart sounds: Normal heart sounds, S1 normal and S2 normal.  Pulmonary:     Breath sounds: Examination of the right-lower field reveals decreased breath sounds and rhonchi. Examination of the left-lower field reveals decreased breath sounds and rhonchi. Decreased breath sounds and rhonchi present. No wheezing or rales.  Abdominal:     Palpations: Abdomen is soft.     Tenderness: There is no abdominal tenderness.  Musculoskeletal:     Right lower leg: No edema.      Left lower leg: No edema.  Skin:    General: Skin is warm.     Findings: No rash.  Neurological:     Mental Status: He is alert.       Data Reviewed: Basic Metabolic Panel: Recent Labs  Lab 10/04/20 1357 10/05/20 0026 10/05/20 0536 10/06/20 0343  NA 137  --  138 139  K 4.2  --  4.5 4.5  CL 104  --  105 103  CO2 26  --  24 26  GLUCOSE 139*  --  100* 164*  BUN 30*  --  24* 24*  CREATININE 1.63*  --  1.49* 1.26*  CALCIUM 9.1  --  9.0 9.2  MG  --  2.4 2.2  --   PHOS  --   --  3.4  --    Liver Function Tests: Recent Labs  Lab 10/04/20 1732 10/05/20 0536  AST 26 24  ALT 22 19  ALKPHOS 61 53  BILITOT 0.7 1.0  PROT 7.0 6.5  ALBUMIN 3.2* 2.9*   No results for input(s): LIPASE, AMYLASE in the last 168 hours. No results for input(s): AMMONIA in the last 168 hours. CBC: Recent Labs  Lab 10/04/20 1357 10/05/20 0026 10/05/20 0536 10/06/20 0343  WBC 13.3* 14.1* 12.2* 8.9  NEUTROABS  --  7.5 8.1*  --   HGB  10.6* 11.5* 10.4* 10.6*  HCT 32.0* 34.8* 31.3* 31.4*  MCV 86.7 87.2 87.2 86.0  PLT 356 374 340 341   BNP (last 3 results) Recent Labs    10/04/20 1357  BNP 166.3*     CBG: Recent Labs  Lab 10/06/20 0839  GLUCAP 168*    Recent Results (from the past 240 hour(s))  Culture, blood (Routine x 2)     Status: None (Preliminary result)   Collection Time: 10/04/20  1:47 PM   Specimen: BLOOD  Result Value Ref Range Status   Specimen Description BLOOD RFA  Final   Special Requests   Final    BOTTLES DRAWN AEROBIC AND ANAEROBIC Blood Culture adequate volume   Culture   Final    NO GROWTH 2 DAYS Performed at [facility], [address], [place], [place] [address]    Report Status PENDING  Incomplete  Culture, blood (Routine x 2)     Status: None (Preliminary result)   Collection Time: 10/04/20  5:32 PM   Specimen: BLOOD  Result Value Ref Range Status   Specimen Description BLOOD RAC  Final   Special Requests   Final    BOTTLES DRAWN AEROBIC  AND ANAEROBIC Blood Culture results may not be optimal due to an excessive volume of blood received in culture bottles   Culture   Final    NO GROWTH 2 DAYS Performed at [facility], [address], [place], [place] [address]    Report Status PENDING  Incomplete  Respiratory Panel by RT PCR (Flu A&B, Covid) - Nasopharyngeal Swab     Status: None   Collection Time: 10/04/20  9:13 PM   Specimen: Nasopharyngeal Swab  Result Value Ref Range Status   SARS Coronavirus 2 by RT PCR NEGATIVE NEGATIVE Final    Comment: (NOTE) SARS-CoV-2 target nucleic acids are NOT DETECTED.  The SARS-CoV-2 RNA is generally detectable in upper respiratoy specimens during the acute phase of infection. The lowest concentration of SARS-CoV-2 viral copies this assay can detect is 131 copies/mL. A negative result does not preclude SARS-Cov-2 infection and should not be used as the sole basis for treatment or other patient management decisions. A negative result may occur with  improper specimen collection/handling, submission of specimen other than nasopharyngeal swab, presence of viral mutation(s) within the areas targeted by this assay, and inadequate number of viral copies (<131 copies/mL). A negative result must be combined with clinical observations, patient history, and epidemiological information. The expected result is Negative.  Fact Sheet for Patients:  PinkCheek.be  Fact Sheet for Healthcare Providers:  GravelBags.it  This test is no t yet approved or cleared by the [place] FDA and  has been authorized for detection and/or diagnosis of SARS-CoV-2 by FDA under an Emergency Use Authorization (EUA). This EUA will remain  in effect (meaning this test can be used) for the duration of the COVID-19 declaration under Section 564(b)(1) of the Act, 21 U.S.C. section 360bbb-3(b)(1), unless the authorization is terminated or revoked  sooner.     Influenza A by PCR NEGATIVE NEGATIVE Final   Influenza B by PCR NEGATIVE NEGATIVE Final    Comment: (NOTE) The Xpert Xpress SARS-CoV-2/FLU/RSV assay is intended as an aid in  the diagnosis of influenza from Nasopharyngeal swab specimens and  should not be used as a sole basis for treatment. Nasal washings and  aspirates are unacceptable for Xpert Xpress SARS-CoV-2/FLU/RSV  testing.  Fact Sheet for Patients: PinkCheek.be  Fact Sheet for Healthcare  Providers: GravelBags.it  This test is not yet approved or cleared by the [place] and  has been authorized for detection and/or diagnosis of SARS-CoV-2 by  FDA under an Emergency Use Authorization (EUA). This EUA will remain  in effect (meaning this test can be used) for the duration of the  Covid-19 declaration under Section 564(b)(1) of the Act, 21  U.S.C. section 360bbb-3(b)(1), unless the authorization is  terminated or revoked. Performed at [facility], [address] [place], [place] [address]      Studies: DG Chest 2 View  Result Date: 10/04/2020 CLINICAL DATA:  Shortness of breath and hypotension.  Chronic cough. EXAM: CHEST - 2 VIEW COMPARISON:  Multiple exams, including 08/05/2020 FINDINGS: Chronic peripheral coarse interstitial lung disease compatible with fibrosis. Underlying emphysema. The interstitial accentuation appears similar to the prior exam. Heart size within normal limits. Atherosclerotic calcification of the aortic arch. No blunting of the costophrenic angles. IMPRESSION: 1. Stable chronic peripheral coarse interstitial lung disease compatible with fibrosis superimposed on underlying emphysema. 2. Aortic Atherosclerosis (ICD10-I70.0) and Emphysema (ICD10-J43.9). Electronically Signed   By: Van Clines M.D.   On: 10/04/2020 15:08   DG Abdomen 1 View  Result Date: 10/04/2020 CLINICAL DATA:  Fever and abdominal pain  EXAM: ABDOMEN - 1 VIEW COMPARISON:  Chest x-ray from earlier in the same day. FINDINGS: Scattered large and small bowel gas is noted. No abnormal mass or abnormal calcifications are seen. No free air is noted. Fibrotic changes are noted in the lungs bilaterally similar to that seen on prior exam. IMPRESSION: No acute abnormality noted Electronically Signed   By: Inez Catalina M.D.   On: 10/04/2020 23:26   US Venous Img Lower Bilateral (DVT)  Result Date: 10/05/2020 CLINICAL DATA:  Elevated D-dimer. EXAM: BILATERAL LOWER EXTREMITY VENOUS DOPPLER ULTRASOUND TECHNIQUE: Gray-scale sonography with compression, as well as color and duplex ultrasound, were performed to evaluate the deep venous system(s) from the level of the common femoral vein through the popliteal and proximal calf veins. COMPARISON:  None. FINDINGS: VENOUS Normal compressibility of the common femoral, superficial femoral, and popliteal veins, as well as the visualized calf veins. Visualized portions of profunda femoral vein and great saphenous vein unremarkable. No filling defects to suggest DVT on grayscale or color Doppler imaging. Doppler waveforms show normal direction of venous flow, normal respiratory plasticity and response to augmentation. Limited views of the contralateral common femoral vein are unremarkable. OTHER None. Limitations: none IMPRESSION: Negative exam. Electronically Signed   By: Inge Rise M.D.   On: 10/05/2020 12:40   ECHOCARDIOGRAM COMPLETE  Result Date: 10/05/2020    ECHOCARDIOGRAM REPORT   Patient Name:   Wesley Newton Date of Exam: 10/05/2020 Medical Rec #:  [id number]       Height:       66.0 in Accession #:    [id number]      Weight:       153.0 lb Date of Birth:  01-06-1927       BSA:          1.785 m Patient Age:    62 years        BP:           141/59 mmHg Patient Gender: M               HR:           76 bpm. Exam Location:  [facility] Procedure: 2D Echo, Cardiac Doppler and Color Doppler Indications:  Dyspnea 786.09  History:         Patient has prior history of Echocardiogram examinations, most                  recent 03/04/2019. COPD; Risk Factors:Dyslipidemia. DVT.  Sonographer:     Sherrie Sport RDCS (AE) Referring Phys:  [place] Diagnosing Phys: Isaias Cowman MD  Sonographer Comments: Suboptimal apical window. Image acquisition challenging due to COPD. IMPRESSIONS  1. Left ventricular ejection fraction, by estimation, is 60 to 65%. The left ventricle has normal function. The left ventricle has no regional wall motion abnormalities. Left ventricular diastolic parameters are consistent with Grade I diastolic dysfunction (impaired relaxation).  2. Right ventricular systolic function is normal. The right ventricular size is normal.  3. The mitral valve is normal in structure. Mild mitral valve regurgitation. No evidence of mitral stenosis.  4. The aortic valve is normal in structure. Aortic valve regurgitation is not visualized. No aortic stenosis is present.  5. The inferior vena cava is normal in size with greater than 50% respiratory variability, suggesting right atrial pressure of 3 mmHg. FINDINGS  Left Ventricle: Left ventricular ejection fraction, by estimation, is 60 to 65%. The left ventricle has normal function. The left ventricle has no regional wall motion abnormalities. The left ventricular internal cavity size was normal in size. There is  no left ventricular hypertrophy. Left ventricular diastolic parameters are consistent with Grade I diastolic dysfunction (impaired relaxation). Right Ventricle: The right ventricular size is normal. No increase in right ventricular wall thickness. Right ventricular systolic function is normal. Left Atrium: Left atrial size was normal in size. Right Atrium: Right atrial size was normal in size. Pericardium: There is no evidence of pericardial effusion. Mitral Valve: The mitral valve is normal in structure. Mild mitral valve regurgitation. No  evidence of mitral valve stenosis. Tricuspid Valve: The tricuspid valve is normal in structure. Tricuspid valve regurgitation is mild . No evidence of tricuspid stenosis. Aortic Valve: The aortic valve is normal in structure. Aortic valve regurgitation is not visualized. No aortic stenosis is present. Aortic valve mean gradient measures 7.0 mmHg. Aortic valve peak gradient measures 11.2 mmHg. Aortic valve area, by VTI measures 2.25 cm. Pulmonic Valve: The pulmonic valve was normal in structure. Pulmonic valve regurgitation is not visualized. No evidence of pulmonic stenosis. Aorta: The aortic root is normal in size and structure. Venous: The inferior vena cava is normal in size with greater than 50% respiratory variability, suggesting right atrial pressure of 3 mmHg. IAS/Shunts: No atrial level shunt detected by color flow Doppler.  LEFT VENTRICLE PLAX 2D LVIDd:         3.90 cm  Diastology LVIDs:         2.01 cm  LV e' medial:    8.81 cm/s LV PW:         1.20 cm  LV E/e' medial:  10.4 LV IVS:        1.08 cm  LV e' lateral:   10.90 cm/s LVOT diam:     2.00 cm  LV E/e' lateral: 8.4 LV SV:         82 LV SV Index:   46 LVOT Area:     3.14 cm  RIGHT VENTRICLE RV Basal diam:  3.95 cm RV S prime:     19.80 cm/s TAPSE (M-mode): 3.8 cm LEFT ATRIUM             Index       RIGHT ATRIUM  Index LA diam:        3.40 cm 1.91 cm/m  RA Area:     23.00 cm LA Vol (A2C):   62.8 ml 35.19 ml/m RA Volume:   69.60 ml  39.00 ml/m LA Vol (A4C):   35.9 ml 20.12 ml/m LA Biplane Vol: 49.3 ml 27.63 ml/m  AORTIC VALVE                    PULMONIC VALVE AV Area (Vmax):    1.72 cm     PV Vmax:        0.90 m/s AV Area (Vmean):   1.94 cm     PV Peak grad:   3.2 mmHg AV Area (VTI):     2.25 cm     RVOT Peak grad: 5 mmHg AV Vmax:           167.33 cm/s AV Vmean:          118.033 cm/s AV VTI:            0.364 m AV Peak Grad:      11.2 mmHg AV Mean Grad:      7.0 mmHg LVOT Vmax:         91.60 cm/s LVOT Vmean:        73.000 cm/s LVOT  VTI:          0.261 m LVOT/AV VTI ratio: 0.72  AORTA Ao Root diam: 2.70 cm MITRAL VALVE                TRICUSPID VALVE MV Area (PHT): 3.77 cm     TR Peak grad:   11.8 mmHg MV Decel Time: 201 msec     TR Vmax:        172.00 cm/s MV E velocity: 91.70 cm/s MV A velocity: 117.00 cm/s  SHUNTS MV E/A ratio:  0.78         Systemic VTI:  0.26 m                             Systemic Diam: 2.00 cm Isaias Cowman MD Electronically signed by Isaias Cowman MD Signature Date/Time: 10/05/2020/1:41:22 PM    Final     Scheduled Meds:  amiodarone  100 mg Oral Daily   atorvastatin  20 mg Oral q1800   budesonide  0.5 mg Nebulization Daily   doxycycline  100 mg Oral Q12H   finasteride  5 mg Oral Daily   gabapentin  100 mg Oral q morning - 10a   gabapentin  200 mg Oral QHS   ipratropium-albuterol  3 mL Inhalation QID   methylPREDNISolone (SOLU-MEDROL) injection  40 mg Intravenous Q12H   midodrine  10 mg Oral TID WC   pantoprazole  40 mg Oral BID   tamsulosin  0.4 mg Oral QPC supper   Continuous Infusions:  cefTRIAXone (ROCEPHIN)  IV 2 g (10/06/20 0818)    Assessment/Plan:  1. PEA arrest.  CPR went on for around 3 minutes when epinephrine was given.  Had a pulse and a blood pressure after CPR held.  The patient started talking.  ER physician decided not to intubate.  Patient will be transferred to the ICU.  Case discussed with critical care specialist.  We will get ABG and EKG.  Get troponin.  Start heparin drip just in case PE.  Unable to do CT scan with creatinine right now but may be later today or tomorrow.  We  will also get cardiology consultation. 2. Pulmonary fibrosis, COPD exacerbation continue Solu-Medrol twice a day 3. Acute kidney injury with a creatinine of 1.63 on presentation.  Improved to 1.26 currently.  Stop IV fluids. 4. Elevated troponins which was thought to be secondary to demand ischemia from acute respiratory distress will repeat troponins and get cardiology  consultation put empirically on heparin drip for right now. 5. Elevated fibrin derivatives.  Unable to get CT scan of the chest right now with his kidney function.  Ultrasound the lower extremity negative for DVT.  Switch Eliquis over to heparin drip. 6. Paroxysmal atrial fibrillation change Eliquis over to heparin 7. History of PAD 8. Chronic hypotension on midodrine 9. GERD on PPI 10. Palliative care consultation   Code Status:     Code Status Orders  (From admission, onward)         Start     Ordered   10/04/20 2348  Full code  Continuous        10/04/20 2347        Code Status History    Date Active Date Inactive Code Status Order ID Comments User Context   05/05/2019 0034 05/07/2019 1858 DNR [id number]  Lance Coon, MD Inpatient   03/03/2019 2208 03/05/2019 1734 DNR [id number]  Arta Silence, MD Inpatient   12/19/2018 0234 12/21/2018 1734 Full Code [id number]  Lance Coon, MD Inpatient   Advance Care Planning Activity     Family Communication: Spoke with granddaughter on the phone Disposition Plan: Status is: Inpatient  Dispo: The patient is from: [facility]              Anticipated d/c is to: Skilled to [facility]              Anticipated d/c date is: With short code here today likely will need more time in the hospital unable to determine discharge date at this point              Patient currently being transferred to ICU after short code this morning.  Consultants:  Critical care specialist  Cardiology  Antibiotics:  Start Rocephin  Doxycycline  Time spent: 35 minutes including code.  Spoke with critical care specialist and messaged cardiology.  [place]  [facility] [employer]

## 2020-10-06 NOTE — Progress Notes (Signed)
   10/06/20 0830  Clinical Encounter Type  Visited With Patient  Visit Type Initial;Spiritual support;Social support  Referral From Nurse  Consult/Referral To Chaplain  Responded to code blue. When I arrived I heard the Pt talking. The staff had everything uncontrol. I hung around until the staff came out of the room. The staff was very happy and that made me happy. Ch will follow-up with Pt as needed.

## 2020-10-06 NOTE — Progress Notes (Signed)
Secretary alerted me that the pt was having issue and she couldn't understand him.  Went into room and found pt disoriented.  Alerted the physician and he came to the room.  Called [facility], and soon after pt became pulseless and a code blue was called.

## 2020-10-07 DIAGNOSIS — J690 Pneumonitis due to inhalation of food and vomit: Secondary | ICD-10-CM

## 2020-10-07 LAB — BLOOD GAS, ARTERIAL
Acid-Base Excess: 4.2 mmol/L — ABNORMAL HIGH (ref 0.0–2.0)
Bicarbonate: 28.4 mmol/L — ABNORMAL HIGH (ref 20.0–28.0)
FIO2: 0.44
O2 Saturation: 97 %
Patient temperature: 37
pCO2 arterial: 40 mmHg (ref 32.0–48.0)
pH, Arterial: 7.46 — ABNORMAL HIGH (ref 7.350–7.450)
pO2, Arterial: 86 mmHg (ref 83.0–108.0)

## 2020-10-07 MED ORDER — LIDOCAINE 5 % EX PTCH
1.0000 | MEDICATED_PATCH | CUTANEOUS | Status: DC
  Administered 2020-10-07 – 2020-10-09 (×3): 1 via TRANSDERMAL
  Filled 2020-10-07 (×3): qty 1

## 2020-10-07 MED ORDER — APIXABAN 2.5 MG PO TABS
2.5000 mg | ORAL_TABLET | Freq: Two times a day (BID) | ORAL | Status: DC
  Administered 2020-10-07 – 2020-10-08 (×2): 2.5 mg via ORAL
  Filled 2020-10-07 (×2): qty 1

## 2020-10-07 MED ORDER — PREDNISONE 20 MG PO TABS
40.0000 mg | ORAL_TABLET | Freq: Every day | ORAL | Status: DC
  Administered 2020-10-08: 40 mg via ORAL
  Filled 2020-10-07: qty 2

## 2020-10-07 NOTE — Progress Notes (Signed)
Patient to be transitioned to comfort care measures  PCCM will sign off

## 2020-10-07 NOTE — Progress Notes (Signed)
[place] visited pt. as follow-up from visit yesterday afternoon; pt. markedly changed from yesterday --> no longer on O2 mask, wearing nasal canula, speaking w/family, eating food per RN.  Family and pt. much encouraged by pt.'s positive change in health.  Pt.'s dtr. from [place], [place] at bedside along w/pt.'s son and pastor.  [place] visited w/family for some time, learning that pt. lives at [facility] and has been living independently since his wife died.  At length, pastor led family in prayer for pt.'s further recovery; family aware of chaplain availability and grateful for visit.

## 2020-10-07 NOTE — Progress Notes (Signed)
PROGRESS NOTE    Wesley Newton  LEX:[id number] DOB: 1927/09/04 DOA: 10/04/2020 PCP: Baxter Hire, MD    Brief Narrative:  Wesley Newton is a 86 y.o. male with medical history significant of COPD, pulmonary fibrosis,history of DVT, HLD, A. Fib, PAD BPH. He presented with shortness of breath and hypertension for past 4 days.  Usually he is on 2 L of nasal cannula at home but had to increase it after 3. He checked his pulse ox this morning was in the 60s.  He is diagnosed with COP exacerbation, he was treated with antibiotics and steroids.  10/19, patient choked on his medicines.  He developed acute respite failure, he became unresponsive.  He also developed cardiac arrest, ACIS started.  Patient had received chest compression. 10/20.  Discussed with patient family, patient has history of multiple pneumonias a year.  He also has some dysphagia in the past.  He probably had a recurrent aspiration pneumonia in the past.  Continue current antibiotic coverage.   Assessment & Plan:   Active Problems:   BPH (benign prostatic hyperplasia)   COPD with acute exacerbation (HCC)   GERD (gastroesophageal reflux disease)   Hypercholesterolemia   HTN (hypertension)   PAD (peripheral artery disease) (HCC)   Idiopathic pulmonary fibrosis (HCC)   Acute on chronic respiratory failure (HCC)   AF (paroxysmal atrial fibrillation) ([place])   Acute respiratory distress   Cardiac arrest ([place])   Hypoxia   #1.  Acute on chronic hypoxemic respite failure secondary to aspiration pneumonia. Patient baseline on 2 L oxygen, currently on 4 L oxygen.  Condition seem to be improving.  Wean oxygen.  2.  Bilateral lower lobe aspiration pneumonia. Patient appears to have recurrent aspiration pneumonia in the past.  Long-term prognosis is poor.  But patient condition has improved since yesterday, he is no longer acute hospice candidate.   Continue antibiotics for today.  Obtain speech therapy evaluation.  3.   Chest pain. Secondary to rib fracture from chest compression.  Symptomatic treatment with Lidoderm patch.  4.  COPD with idiopathic pulmonary fibrosis. Continue to follow, no bronchospasm.  Will change steroids to oral for a few days.  5.  Paroxysmal atrial fibrillation. Restart Eliquis.  6.  Chronic kidney disease stage IIIa.  Stable   DVT prophylaxis: Eliquis Code Status: DNR Family Communication: Daughter and granddaughter at bedside  .   Status is: Inpatient  Remains inpatient appropriate because:Inpatient level of care appropriate due to severity of illness   Dispo: The patient is from: Home              Anticipated d/c is to: SNF              Anticipated d/c date is: 2 days              Patient currently is not medically stable to d/c.  Planning to discharge to SNF with palliative care.      I/O last 3 completed shifts: In: 558.7 [P.O.:240; I.V.:271.8; IV Piggyback:46.8] Out: 1550 [Urine:1550] Total I/O In: 664.7 [P.O.:240; I.V.:324.7; IV Piggyback:100] Out: 400 [Urine:400]     Consultants:   Critical care  Procedures: None  Antimicrobials:  Subjective: Patient much improved today.  He has some dysphagia, on 4 L oxygen, short of breath with exertion.  He also complained of chest pain from the prior chest compression. He has a cough, largely nonproductive. No abdominal pain nausea vomiting. No fever or chills.  Objective: Vitals:   10/07/20  0400 10/07/20 0600 10/07/20 0800 10/07/20 1200  BP: 126/61 (!) 123/54    Pulse: 67 80    Resp: 15 (!) 23    Temp: 98 F (36.7 C)  (!) 97.3 F (36.3 C) 98 F (36.7 C)  TempSrc: Oral  Axillary Oral  SpO2: 100% 93%    Weight:      Height:        Intake/Output Summary (Last 24 hours) at 10/07/2020 1456 Last data filed at 10/07/2020 1300 Gross per 24 hour  Intake 1223.39 ml  Output 1225 ml  Net -1.61 ml   Filed Weights   10/04/20 1344  Weight: 69.4 kg    Examination:  General exam: Appears calm  and comfortable  Respiratory system: A few crackles in the base bilaterally. Respiratory effort normal. Cardiovascular system: S1 & S2 heard, RRR. No JVD, murmurs, rubs, gallops or clicks. No pedal edema. Gastrointestinal system: Abdomen is nondistended, soft and nontender. No organomegaly or masses felt. Normal bowel sounds heard. Central nervous system: Alert and oriented x3. No focal neurological deficits. Extremities: Symmetric 5 x 5 power. Skin: No rashes, lesions or ulcers Psychiatry:  Mood & affect appropriate.     Data Reviewed: I have personally reviewed following labs and imaging studies  CBC: Recent Labs  Lab 10/04/20 1357 10/05/20 0026 10/05/20 0536 10/06/20 0343  WBC 13.3* 14.1* 12.2* 8.9  NEUTROABS  --  7.5 8.1*  --   HGB 10.6* 11.5* 10.4* 10.6*  HCT 32.0* 34.8* 31.3* 31.4*  MCV 86.7 87.2 87.2 86.0  PLT 356 374 340 382   Basic Metabolic Panel: Recent Labs  Lab 10/04/20 1357 10/05/20 0026 10/05/20 0536 10/06/20 0343  NA 137  --  138 139  K 4.2  --  4.5 4.5  CL 104  --  105 103  CO2 26  --  24 26  GLUCOSE 139*  --  100* 164*  BUN 30*  --  24* 24*  CREATININE 1.63*  --  1.49* 1.26*  CALCIUM 9.1  --  9.0 9.2  MG  --  2.4 2.2  --   PHOS  --   --  3.4  --    GFR: Estimated Creatinine Clearance: 34.5 mL/min (A) (by C-G formula based on SCr of 1.26 mg/dL (H)). Liver Function Tests: Recent Labs  Lab 10/04/20 1732 10/05/20 0536  AST 26 24  ALT 22 19  ALKPHOS 61 53  BILITOT 0.7 1.0  PROT 7.0 6.5  ALBUMIN 3.2* 2.9*   No results for input(s): LIPASE, AMYLASE in the last 168 hours. No results for input(s): AMMONIA in the last 168 hours. Coagulation Profile: Recent Labs  Lab 10/04/20 1357  INR 1.2   Cardiac Enzymes: No results for input(s): CKTOTAL, CKMB, CKMBINDEX, TROPONINI in the last 168 hours. BNP (last 3 results) No results for input(s): PROBNP in the last 8760 hours. HbA1C: No results for input(s): HGBA1C in the last 72  hours. CBG: Recent Labs  Lab 10/06/20 0839 10/06/20 0913  GLUCAP 168* 201*   Lipid Profile: No results for input(s): CHOL, HDL, LDLCALC, TRIG, CHOLHDL, LDLDIRECT in the last 72 hours. Thyroid Function Tests: Recent Labs    10/05/20 0536  TSH 0.655   Anemia Panel: Recent Labs    10/05/20 0536  VITAMINB12 381  FOLATE 34.0  FERRITIN 171  TIBC 185*  IRON 19*  RETICCTPCT 1.5   Sepsis Labs: Recent Labs  Lab 10/04/20 1357 10/04/20 1732 10/05/20 0536 10/06/20 0343  PROCALCITON  --  0.13 <0.10  0.25  LATICACIDVEN 1.2  --   --   --     Recent Results (from the past 240 hour(s))  Culture, blood (Routine x 2)     Status: None (Preliminary result)   Collection Time: 10/04/20  1:47 PM   Specimen: BLOOD  Result Value Ref Range Status   Specimen Description BLOOD RFA  Final   Special Requests   Final    BOTTLES DRAWN AEROBIC AND ANAEROBIC Blood Culture adequate volume   Culture   Final    NO GROWTH 3 DAYS Performed at [facility], [address], [place], [place] [address]    Report Status PENDING  Incomplete  Culture, blood (Routine x 2)     Status: None (Preliminary result)   Collection Time: 10/04/20  5:32 PM   Specimen: BLOOD  Result Value Ref Range Status   Specimen Description BLOOD RAC  Final   Special Requests   Final    BOTTLES DRAWN AEROBIC AND ANAEROBIC Blood Culture results may not be optimal due to an excessive volume of blood received in culture bottles   Culture   Final    NO GROWTH 3 DAYS Performed at [facility], [address], [place], [place] [address]    Report Status PENDING  Incomplete  Respiratory Panel by RT PCR (Flu A&B, Covid) - Nasopharyngeal Swab     Status: None   Collection Time: 10/04/20  9:13 PM   Specimen: Nasopharyngeal Swab  Result Value Ref Range Status   SARS Coronavirus 2 by RT PCR NEGATIVE NEGATIVE Final    Comment: (NOTE) SARS-CoV-2 target nucleic acids are NOT DETECTED.  The SARS-CoV-2 RNA is  generally detectable in upper respiratoy specimens during the acute phase of infection. The lowest concentration of SARS-CoV-2 viral copies this assay can detect is 131 copies/mL. A negative result does not preclude SARS-Cov-2 infection and should not be used as the sole basis for treatment or other patient management decisions. A negative result may occur with  improper specimen collection/handling, submission of specimen other than nasopharyngeal swab, presence of viral mutation(s) within the areas targeted by this assay, and inadequate number of viral copies (<131 copies/mL). A negative result must be combined with clinical observations, patient history, and epidemiological information. The expected result is Negative.  Fact Sheet for Patients:  PinkCheek.be  Fact Sheet for Healthcare Providers:  GravelBags.it  This test is no t yet approved or cleared by the [place] FDA and  has been authorized for detection and/or diagnosis of SARS-CoV-2 by FDA under an Emergency Use Authorization (EUA). This EUA will remain  in effect (meaning this test can be used) for the duration of the COVID-19 declaration under Section 564(b)(1) of the Act, 21 U.S.C. section 360bbb-3(b)(1), unless the authorization is terminated or revoked sooner.     Influenza A by PCR NEGATIVE NEGATIVE Final   Influenza B by PCR NEGATIVE NEGATIVE Final    Comment: (NOTE) The Xpert Xpress SARS-CoV-2/FLU/RSV assay is intended as an aid in  the diagnosis of influenza from Nasopharyngeal swab specimens and  should not be used as a sole basis for treatment. Nasal washings and  aspirates are unacceptable for Xpert Xpress SARS-CoV-2/FLU/RSV  testing.  Fact Sheet for Patients: PinkCheek.be  Fact Sheet for Healthcare Providers: GravelBags.it  This test is not yet approved or cleared by the [place] FDA and  has been authorized for detection and/or diagnosis of SARS-CoV-2 by  FDA under an Emergency Use Authorization (EUA). This EUA will  remain  in effect (meaning this test can be used) for the duration of the  Covid-19 declaration under Section 564(b)(1) of the Act, 21  U.S.C. section 360bbb-3(b)(1), unless the authorization is  terminated or revoked. Performed at [facility], [address] [place], [place] [address]   MRSA PCR Screening     Status: None   Collection Time: 10/06/20  9:40 AM   Specimen: Nasopharyngeal  Result Value Ref Range Status   MRSA by PCR NEGATIVE NEGATIVE Final    Comment:        The GeneXpert MRSA Assay (FDA approved for NASAL specimens only), is one component of a comprehensive MRSA colonization surveillance program. It is not intended to diagnose MRSA infection nor to guide or monitor treatment for MRSA infections. Performed at [facility], [address] [place], [place] [address]          Radiology Studies: No results found.      Scheduled Meds: . amiodarone  100 mg Oral Daily  . atorvastatin  20 mg Oral q1800  . Chlorhexidine Gluconate Cloth  6 each Topical Daily  . doxycycline  100 mg Oral Q12H  . finasteride  5 mg Oral Daily  . gabapentin  100 mg Oral q morning - 10a  . gabapentin  200 mg Oral QHS  . lidocaine  1 patch Transdermal Q24H  . methylPREDNISolone (SOLU-MEDROL) injection  40 mg Intravenous Q12H  . midodrine  10 mg Oral TID WC  . pantoprazole  40 mg Oral BID  . tamsulosin  0.4 mg Oral QPC supper   Continuous Infusions: . cefTRIAXone (ROCEPHIN)  IV 2 g (10/07/20 0937)  . lactated ringers 50 mL/hr at 10/07/20 0600     LOS: 2 days    Time spent: 29 minutes    Sharen Hones, MD [facility]   To contact the attending provider between 7A-7P or the covering provider during after hours 7P-7A, please log into the web site [email or web address] and access using universal [place]  password for that web site. If you do not have the password, please call the hospital operator.  10/07/2020, 2:56 PM

## 2020-10-07 NOTE — Plan of Care (Signed)
  Problem: Clinical Measurements: Goal: Ability to maintain clinical measurements within normal limits will improve Outcome: Progressing Goal: Will remain free from infection Outcome: Progressing Goal: Diagnostic test results will improve Outcome: Progressing Goal: Respiratory complications will improve Outcome: Progressing Goal: Cardiovascular complication will be avoided Outcome: Progressing   Problem: Education: Goal: Knowledge of General Education information will improve Description: Including pain rating scale, medication(s)/side effects and non-pharmacologic comfort measures Outcome: Progressing   Problem: Coping: Goal: Level of anxiety will decrease Outcome: Progressing   Problem: Safety: Goal: Ability to remain free from injury will improve Outcome: Progressing   Problem: Pain Managment: Goal: General experience of comfort will improve Outcome: Progressing   Problem: Education: Goal: Knowledge of disease or condition will improve Outcome: Progressing Goal: Knowledge of the prescribed therapeutic regimen will improve Outcome: Progressing

## 2020-10-07 NOTE — Evaluation (Signed)
Occupational Therapy Evaluation Patient Details Name: Wesley Newton MRN: [id number] DOB: Apr 04, 1927 Today's Date: 10/07/2020    History of Present Illness Patient is a 86 y.o. male with medical history significant of COPD, pulmonary fibrosis, with history of DVT, HLD, A. Fib, PAD BPH, admitted for acute on chronic respiratory failure after having worsening shortness of breath and hypotension over the last 4 days.    Clinical Impression   This therapist evaluated Wesley Newton for OT services two days earlier, in ED, and re-evaluated him today following his transfer to the ICU. Despite a significant health emergency yesterday, with pt becoming unresponsive and receiving CPR, Wesley Newton is much improved today, A&O x4, complaining of only minor pain and no SOB (on 3L O2 [place]). Several family members are present at bedside; provided educ re: role of OT, POC. Wesley Newton can continue to benefit from skilled OT services while hospitalized, to address decreased mobility, endurance, and balance. [name], recommend transfer to skilled nursing facility within pt's New Site.    Follow Up Recommendations  Home health OT;SNF    Equipment Recommendations  None recommended by OT    Recommendations for Other Services       Precautions / Restrictions Precautions Precautions: Fall Restrictions Weight Bearing Restrictions: No Other Position/Activity Restrictions: Pt's code status now changed to DNR.      Mobility Bed Mobility Overal bed mobility: Needs Assistance Bed Mobility: Rolling Rolling: Min assist              Transfers                 General transfer comment: not attempted    Balance       Sitting balance - Comments: did not come to EOB sitting                       High Level Balance Comments: Did not come to standing           ADL either performed or assessed with clinical judgement   ADL Overall ADL's : Needs assistance/impaired                                              Vision Baseline Vision/History: Wears glasses Wears Glasses: At all times Patient Visual Report: No change from baseline       Perception     Praxis      Pertinent Vitals/Pain Pain Assessment: No/denies pain Pain Location: pain in chest 2/2 rib fx following CPR Pain Intervention(s): Limited activity within patient's tolerance;Monitored during session     Hand Dominance     Extremity/Trunk Assessment Upper Extremity Assessment Upper Extremity Assessment: Generalized weakness   Lower Extremity Assessment Lower Extremity Assessment: Generalized weakness       Communication Communication Communication: No difficulties;HOH   Cognition Arousal/Alertness: Awake/alert Behavior During Therapy: WFL for tasks assessed/performed Overall Cognitive Status: Within Functional Limits for tasks assessed                                     General Comments       Exercises Other Exercises Other Exercises: educ re: role of OT, POC, EC strategies; family education   Shoulder Instructions      Home Living Family/patient expects to be discharged  to:: Other (Comment) Living Arrangements: Alone Available Help at Discharge: Family;Available PRN/intermittently Type of Home: Apartment Home Access: Level entry     Home Layout: One level     Bathroom Shower/Tub: Tub/shower unit     Bathroom Accessibility: Yes   Home Equipment: Walker - 2 wheels;Cane - single point;Tub bench   Additional Comments: [facility], in independent living section      Prior Functioning/Environment Level of Independence: Independent with assistive device(s)        Comments: patient reports using cane or rolling walker for ambulation and uses 2L oxygen reguarly at home. patient states he has occasional meals prepared at [facility] and has a housekeeper that helps once per month with cleaning. patient does occasionally drive per  his report        OT Problem List: Decreased strength;Decreased activity tolerance;Impaired balance (sitting and/or standing)      OT Treatment/Interventions: Self-care/ADL training;DME and/or AE instruction;Therapeutic activities;Balance training;Therapeutic exercise;Energy conservation;Patient/family education    OT Goals(Current goals can be found in the care plan section) Acute Rehab OT Goals Patient Stated Goal: would like to be able to "breath good again" and to "get my strength back" OT Goal Formulation: With patient Time For Goal Achievement: 10/19/20 Potential to Achieve Goals: Good  OT Frequency: Min 2X/week   Barriers to D/C:            Co-evaluation              AM-PAC OT "6 Clicks" Daily Activity     Outcome Measure Help from another person eating meals?: A Little Help from another person taking care of personal grooming?: A Little Help from another person toileting, which includes using toliet, bedpan, or urinal?: A Lot Help from another person bathing (including washing, rinsing, drying)?: A Lot Help from another person to put on and taking off regular upper body clothing?: A Little Help from another person to put on and taking off regular lower body clothing?: A Lot 6 Click Score: 15   End of Session Nurse Communication: Other (comment) (cleared for OT)  Activity Tolerance: Patient limited by fatigue Patient left: in bed;with call bell/phone within reach;with family/visitor present  OT Visit Diagnosis: Unsteadiness on feet (R26.81);Muscle weakness (generalized) (M62.81)                Time: 0320-0330 OT Time Calculation (min): 10 min Charges:  OT General Charges $OT Visit: 1 Visit OT Evaluation $OT Eval Low Complexity: [place]  Wesley Lobo, PhD, [place], OTR/L ascom [phone] 10/07/20, 4:30 PM

## 2020-10-07 NOTE — Progress Notes (Signed)
PT Cancellation Note  Patient Details Name: Wesley Newton MRN: [id number] DOB: October 22, 1927   Cancelled Treatment:    Reason Eval/Treat Not Completed: Medical issues which prohibited therapy. Patient now comfort care with poor prognosis. Will complete PT order at this time.    Declin Rajan 10/07/2020, 11:48 AM

## 2020-10-08 DIAGNOSIS — Z66 Do not resuscitate: Secondary | ICD-10-CM

## 2020-10-08 DIAGNOSIS — Z7189 Other specified counseling: Secondary | ICD-10-CM

## 2020-10-08 DIAGNOSIS — Z515 Encounter for palliative care: Secondary | ICD-10-CM

## 2020-10-08 MED ORDER — PREDNISONE 20 MG PO TABS
20.0000 mg | ORAL_TABLET | Freq: Every day | ORAL | Status: DC
  Administered 2020-10-09: 20 mg via ORAL
  Filled 2020-10-08: qty 1

## 2020-10-08 MED ORDER — IPRATROPIUM-ALBUTEROL 0.5-2.5 (3) MG/3ML IN SOLN
3.0000 mL | Freq: Four times a day (QID) | RESPIRATORY_TRACT | Status: DC
  Administered 2020-10-08 – 2020-10-09 (×5): 3 mL via RESPIRATORY_TRACT
  Filled 2020-10-08 (×4): qty 3

## 2020-10-08 MED ORDER — LEVOFLOXACIN 500 MG PO TABS
500.0000 mg | ORAL_TABLET | Freq: Every day | ORAL | Status: AC
  Administered 2020-10-08 – 2020-10-09 (×2): 500 mg via ORAL
  Filled 2020-10-08 (×3): qty 1

## 2020-10-08 MED ORDER — APIXABAN 5 MG PO TABS
5.0000 mg | ORAL_TABLET | Freq: Two times a day (BID) | ORAL | Status: DC
  Administered 2020-10-08 – 2020-10-09 (×2): 5 mg via ORAL
  Filled 2020-10-08 (×2): qty 1

## 2020-10-08 NOTE — Progress Notes (Addendum)
PROGRESS NOTE    Wesley Newton  TFT:[id number] DOB: 1927-05-03 DOA: 10/04/2020 PCP: Baxter Hire, MD   Chief Complaint: Shortness of Breath Brief Narrative:  Wesley Swindler Wesley Newton a 86 y.o.malewith medical history significant ofCOPD, pulmonary fibrosis,history of DVT, HLD, A. Fib, PAD BPH. He presented withshortness of breath and hypertension for past 4 days.  Usually he is on 2 L of nasal cannula at home but had to increase it after 3. He checked his pulse ox this morning was in the 60s.  He is diagnosed with COP exacerbation, he was treated with antibiotics and steroids.  10/19, patient choked on his medicines.  He developed acute respite failure, he became unresponsive.  He also developed cardiac arrest, ACIS started.  Patient had received chest compression. 10/20.  Discussed with patient family, patient has history of multiple pneumonias a year.  He also has some dysphagia in the past.  He probably had a recurrent aspiration pneumonia in the past.  Continue current antibiotic coverage.   Assessment & Plan:   Active Problems:   BPH (benign prostatic hyperplasia)   COPD with acute exacerbation (HCC)   GERD (gastroesophageal reflux disease)   Hypercholesterolemia   HTN (hypertension)   PAD (peripheral artery disease) (HCC)   Idiopathic pulmonary fibrosis (HCC)   Acute on chronic respiratory failure (HCC)   AF (paroxysmal atrial fibrillation) ([place])   Acute respiratory distress   Cardiac arrest ([place])   Hypoxia  #1.  Acute on chronic hypoxemic respite failure secondary to aspiration pneumonia. Condition improving, wean oxygen.  #2. Aspiration Pneumonia. Pending speech eval. Condition improving, change abx to Levaquin for 2 day doses.  3. Chest pain due to rib fractures. Improving, continue Lidoderm patch.  4. COPD exacerbation and idiopathic pulmonary fibrosis. Restart home dose duoneb. Wean off steroids.  5. PAF. Continue anticoagulation.  6. CKD 3a. Follow.  7.  Status post cardiac arrest.  Echo 10/18 showed normal EF, no significant valvular diseases.    DVT prophylaxis: Eliquis Code Status: DNR Family Communication: updated daughter Disposition Plan:  .   Status is: Inpatient  Remains inpatient appropriate because:Inpatient level of care appropriate due to severity of illness   Dispo: The patient is from: SNF              Anticipated d/c is to: SNF              Anticipated d/c date is: 1 day              Patient currently is not medically stable to d/c.        I/O last 3 completed shifts: In: 1614.8 [P.O.:480; I.V.:981.7; IV Piggyback:153.2] Out: 1725 [Urine:1725] Total I/O In: 100 [IV Piggyback:100] Out: 77 [Urine:450]     Consultants:   None  Procedures: None  Antimicrobials: Levaquin  Subjective: Patient doing well today, had a good night sleep, no shortness of breath. Still chest pain with movement or deep breath, no palpitation No abdominal pain or nausea or vomiting. No fever or chills. No dysuria or hematuria   Objective: Vitals:   10/07/20 2000 10/07/20 2106 10/08/20 0532 10/08/20 0738  BP: (!) 142/93 (!) 141/58 (!) 147/60 (!) 138/56  Pulse: 74 77 76 68  Resp: (!) 21 16 17 16   Temp: 98.4 F (36.9 C) 98 F (36.7 C) 97.9 F (36.6 C) 98 F (36.7 C)  TempSrc: Oral Oral Oral Oral  SpO2: 100% 98% 97% 99%  Weight:      Height:  Intake/Output Summary (Last 24 hours) at 10/08/2020 1113 Last data filed at 10/08/2020 0925 Gross per 24 hour  Intake 1442.99 ml  Output 1650 ml  Net -207.01 ml   Filed Weights   10/04/20 1344  Weight: 69.4 kg    Examination:  General exam: Appears calm and comfortable  Respiratory system: Clear to auscultation. Respiratory effort normal. Cardiovascular system: S1 & S2 heard, RRR. No JVD, murmurs, rubs, gallops or clicks. No pedal edema. Gastrointestinal system: Abdomen is nondistended, soft and nontender. No organomegaly or masses felt. Normal bowel sounds  heard. Central nervous system: Alert and oriented x2. No focal neurological deficits. Extremities: Symmetric  Skin: No rashes, lesions or ulcers Psychiatry:  Mood & affect appropriate.     Data Reviewed: I have personally reviewed following labs and imaging studies  CBC: Recent Labs  Lab 10/04/20 1357 10/05/20 0026 10/05/20 0536 10/06/20 0343  WBC 13.3* 14.1* 12.2* 8.9  NEUTROABS  --  7.5 8.1*  --   HGB 10.6* 11.5* 10.4* 10.6*  HCT 32.0* 34.8* 31.3* 31.4*  MCV 86.7 87.2 87.2 86.0  PLT 356 374 340 702   Basic Metabolic Panel: Recent Labs  Lab 10/04/20 1357 10/05/20 0026 10/05/20 0536 10/06/20 0343  NA 137  --  138 139  K 4.2  --  4.5 4.5  CL 104  --  105 103  CO2 26  --  24 26  GLUCOSE 139*  --  100* 164*  BUN 30*  --  24* 24*  CREATININE 1.63*  --  1.49* 1.26*  CALCIUM 9.1  --  9.0 9.2  MG  --  2.4 2.2  --   PHOS  --   --  3.4  --    GFR: Estimated Creatinine Clearance: 34.5 mL/min (A) (by C-G formula based on SCr of 1.26 mg/dL (H)). Liver Function Tests: Recent Labs  Lab 10/04/20 1732 10/05/20 0536  AST 26 24  ALT 22 19  ALKPHOS 61 53  BILITOT 0.7 1.0  PROT 7.0 6.5  ALBUMIN 3.2* 2.9*   No results for input(s): LIPASE, AMYLASE in the last 168 hours. No results for input(s): AMMONIA in the last 168 hours. Coagulation Profile: Recent Labs  Lab 10/04/20 1357  INR 1.2   Cardiac Enzymes: No results for input(s): CKTOTAL, CKMB, CKMBINDEX, TROPONINI in the last 168 hours. BNP (last 3 results) No results for input(s): PROBNP in the last 8760 hours. HbA1C: No results for input(s): HGBA1C in the last 72 hours. CBG: Recent Labs  Lab 10/06/20 0839 10/06/20 0913  GLUCAP 168* 201*   Lipid Profile: No results for input(s): CHOL, HDL, LDLCALC, TRIG, CHOLHDL, LDLDIRECT in the last 72 hours. Thyroid Function Tests: No results for input(s): TSH, T4TOTAL, FREET4, T3FREE, THYROIDAB in the last 72 hours. Anemia Panel: No results for input(s): VITAMINB12,  FOLATE, FERRITIN, TIBC, IRON, RETICCTPCT in the last 72 hours. Sepsis Labs: Recent Labs  Lab 10/04/20 1357 10/04/20 1732 10/05/20 0536 10/06/20 0343  PROCALCITON  --  0.13 <0.10 0.25  LATICACIDVEN 1.2  --   --   --     Recent Results (from the past 240 hour(s))  Culture, blood (Routine x 2)     Status: None (Preliminary result)   Collection Time: 10/04/20  1:47 PM   Specimen: BLOOD  Result Value Ref Range Status   Specimen Description BLOOD RFA  Final   Special Requests   Final    BOTTLES DRAWN AEROBIC AND ANAEROBIC Blood Culture adequate volume   Culture  Final    NO GROWTH 4 DAYS Performed at [facility], [address] [place], [place] [address]    Report Status PENDING  Incomplete  Culture, blood (Routine x 2)     Status: None (Preliminary result)   Collection Time: 10/04/20  5:32 PM   Specimen: BLOOD  Result Value Ref Range Status   Specimen Description BLOOD RAC  Final   Special Requests   Final    BOTTLES DRAWN AEROBIC AND ANAEROBIC Blood Culture results may not be optimal due to an excessive volume of blood received in culture bottles   Culture   Final    NO GROWTH 4 DAYS Performed at [facility], [address], [place], [place] [address]    Report Status PENDING  Incomplete  Respiratory Panel by RT PCR (Flu A&B, Covid) - Nasopharyngeal Swab     Status: None   Collection Time: 10/04/20  9:13 PM   Specimen: Nasopharyngeal Swab  Result Value Ref Range Status   SARS Coronavirus 2 by RT PCR NEGATIVE NEGATIVE Final    Comment: (NOTE) SARS-CoV-2 target nucleic acids are NOT DETECTED.  The SARS-CoV-2 RNA is generally detectable in upper respiratoy specimens during the acute phase of infection. The lowest concentration of SARS-CoV-2 viral copies this assay can detect is 131 copies/mL. A negative result does not preclude SARS-Cov-2 infection and should not be used as the sole basis for treatment or other patient management decisions. A  negative result may occur with  improper specimen collection/handling, submission of specimen other than nasopharyngeal swab, presence of viral mutation(s) within the areas targeted by this assay, and inadequate number of viral copies (<131 copies/mL). A negative result must be combined with clinical observations, patient history, and epidemiological information. The expected result is Negative.  Fact Sheet for Patients:  PinkCheek.be  Fact Sheet for Healthcare Providers:  GravelBags.it  This test is no t yet approved or cleared by the [place] FDA and  has been authorized for detection and/or diagnosis of SARS-CoV-2 by FDA under an Emergency Use Authorization (EUA). This EUA will remain  in effect (meaning this test can be used) for the duration of the COVID-19 declaration under Section 564(b)(1) of the Act, 21 U.S.C. section 360bbb-3(b)(1), unless the authorization is terminated or revoked sooner.     Influenza A by PCR NEGATIVE NEGATIVE Final   Influenza B by PCR NEGATIVE NEGATIVE Final    Comment: (NOTE) The Xpert Xpress SARS-CoV-2/FLU/RSV assay is intended as an aid in  the diagnosis of influenza from Nasopharyngeal swab specimens and  should not be used as a sole basis for treatment. Nasal washings and  aspirates are unacceptable for Xpert Xpress SARS-CoV-2/FLU/RSV  testing.  Fact Sheet for Patients: PinkCheek.be  Fact Sheet for Healthcare Providers: GravelBags.it  This test is not yet approved or cleared by the [place] FDA and  has been authorized for detection and/or diagnosis of SARS-CoV-2 by  FDA under an Emergency Use Authorization (EUA). This EUA will remain  in effect (meaning this test can be used) for the duration of the  Covid-19 declaration under Section 564(b)(1) of the Act, 21  U.S.C. section 360bbb-3(b)(1), unless the authorization  is  terminated or revoked. Performed at [facility], [address] [place], [place] [address]   MRSA PCR Screening     Status: None   Collection Time: 10/06/20  9:40 AM   Specimen: Nasopharyngeal  Result Value Ref Range Status   MRSA by PCR NEGATIVE NEGATIVE Final  Comment:        The GeneXpert MRSA Assay (FDA approved for NASAL specimens only), is one component of a comprehensive MRSA colonization surveillance program. It is not intended to diagnose MRSA infection nor to guide or monitor treatment for MRSA infections. Performed at [facility], [address], [place], [place] [address]          Radiology Studies: No results found.      Scheduled Meds: . amiodarone  100 mg Oral Daily  . apixaban  2.5 mg Oral BID  . atorvastatin  20 mg Oral q1800  . Chlorhexidine Gluconate Cloth  6 each Topical Daily  . finasteride  5 mg Oral Daily  . gabapentin  100 mg Oral q morning - 10a  . gabapentin  200 mg Oral QHS  . ipratropium-albuterol  3 mL Nebulization QID  . levofloxacin  500 mg Oral Daily  . lidocaine  1 patch Transdermal Q24H  . midodrine  10 mg Oral TID WC  . pantoprazole  40 mg Oral BID  . [START ON 10/09/2020] predniSONE  20 mg Oral Q breakfast  . tamsulosin  0.4 mg Oral QPC supper   Continuous Infusions: . lactated ringers Stopped (10/07/20 2047)     LOS: 3 days    Time spent: 28 minutes    Sharen Hones, MD [facility]   To contact the attending provider between 7A-7P or the covering provider during after hours 7P-7A, please log into the web site [email or web address] and access using universal  password for that web site. If you do not have the password, please call the hospital operator.  10/08/2020, 11:13 AM

## 2020-10-08 NOTE — Consult Note (Signed)
Consultation Note Date: 10/08/2020   Patient Name: Wesley Newton  DOB: [birth year]  MRN: [id number]  Age / Sex: 86 y.o., male   PCP: Baxter Hire, MD Referring Physician: Sharen Hones, MD   REASON FOR CONSULTATION:Establishing goals of care  Palliative Care consult requested for goals of care discussion in this 86 y.o. male with multiple medical problems including pulmonary fibrosis, COPD (2L home oxygen), atrial fibrillation, BPH, DVT, hyperlipidemia, and asthma.  Patient presented to the ED from [facility] with complaints of shortness of breath.  It was reported patient's oxygen saturations were in the 60s prior to arrival. Patient experienced an aspiration event on 10/19 resulting in a transfer to ICU and initial concerns for recovery after PEA arrest and CPR for 65mn. He has since become more awake, alert and oriented. PT recommendations for SNF rehab.   Clinical Assessment and Goals of Care: I have reviewed medical records including lab results, imaging, Epic notes, and MAR, received report from the bedside RN, and assessed the patient. I met at the bedside with Mr. FKampfand his daughter to discuss diagnosis prognosis, GOC, EOL wishes, disposition and options.  Mr. FPinardis sitting up in the recliner. He is awake, alert and oriented x3. Some coughing noted which he is able to tolerate by splinting chest with a pillow.   I introduced Palliative Medicine as specialized medical care for people living with serious illness. It focuses on providing relief from the symptoms and stress of a serious illness. The goal is to improve quality of life for both the patient and the family.  Patient and daughter verbalized understanding and appreciation.  We discussed a brief life review of the patient, along with his functional and nutritional status.  Mr. FIglesiashares that he is a widow for the last 8 years.  He and his wife were married for more than 678years with 3 children.  He  [place] in the [place] and worked for more than 30 years for [employer]  [place].  [facility]  He was able to perform all ADLs independently with occasional cane assistance when ambulatory.  He endorses wearing 2 L of oxygen via nasal cannula as needed.  He monitored his oxygen saturations frequently in the home with his personal pulse oximeter.  He and daughter reports a great appetite.  Patient would often do his own laundry and shop for his own groceries.  Patient spent time sharing memories of his life over the years.   We discussed His current illness and what it means in the larger context of His on-going co-morbidities. With specific discussions regarding his COPD, recent cardiac arrest, and overall functional decline.  Natural disease trajectory and expectations at EOL were discussed.  Patient and daughter verbalized understanding of his current illness and comorbidities. Mr. FSennashares he does not remember much of the events surrounding his arrest but is thankful for his recovery acknowledging some increased shortness of breath with exertion and generalized weakness.  I attempted to elicit values and goals of care important to the patient.    Patient and family clear in their expressed goals to continue to treat the treatable and remaining hopeful for continued improvement/stability.  They acknowledge patient will most likely need additional care and support after discharge.  They are all in mutual agreement for patient  to return to [facility] skilled facility at discharge.  Advanced directives, concepts specific to code status, artifical feeding and hydration, and rehospitalization were considered and discussed. Patient does have a documented advanced directive.  His granddaughter Wesley Newton is his designated Primary school teacher.  Patient confirms DNR/DNI.  Recommendations provided for Palliative Care services outpatient.  Education provided on Palliative goals and philosophy of care.  Patient and family verbalized their understanding. Confirming their wishes for outpatient support.    Questions and concerns were addressed.  Patient and family was encouraged to call with questions or concerns.  PMT will continue to support holistically.   SOCIAL HISTORY:     reports that he has quit smoking. He has never used smokeless tobacco. He reports that he does not drink alcohol and does not use drugs.  CODE STATUS: DNR  ADVANCE DIRECTIVES: Primary Decision Maker:Patient/Wesley Newton (HCPOA/Granddaughter)    SYMPTOM MANAGEMENT: per attending   Palliative Prophylaxis:   Aspiration, Delirium Protocol, Frequent Pain Assessment and Oral Care  PSYCHO-SOCIAL/SPIRITUAL:  Support System: Family  Desire for further Chaplaincy support: No  Additional Recommendations (Limitations, Scope, Preferences):  Treat the treatable   Education on palliative    PAST MEDICAL HISTORY: Past Medical History:  Diagnosis Date   Asthma    BPH (benign prostatic hyperplasia)    Bronchitis    Cancer (HCC)    COPD (chronic obstructive pulmonary disease) (HCC)    DVT of leg (deep venous thrombosis) (HCC)    Elevated PSA    Emphysema lung (HCC)    Emphysema of lung (HCC)    Hyperlipidemia    Nocturia    Pulmonary fibrosis (HCC)    Pulmonary fibrosis (HCC)    Skin cancer     ALLERGIES:  is allergic to amoxicillin, codeine, rapaflo [silodosin], tramadol, and trimethoprim.   MEDICATIONS:  Current Facility-Administered Medications  Medication Dose Route Frequency Provider Last Rate Last Admin   acetaminophen (TYLENOL) tablet 650 mg  650 mg Oral Q6H PRN Toy Baker, MD   650 mg at 10/06/20 2355   Or   acetaminophen (TYLENOL) suppository 650 mg  650 mg Rectal Q6H PRN Doutova, Anastassia, MD        albuterol (VENTOLIN HFA) 108 (90 Base) MCG/ACT inhaler 2 puff  2 puff Inhalation Q6H PRN Doutova, Anastassia, MD       amiodarone (PACERONE) tablet 100 mg  100 mg Oral Daily Doutova, Anastassia, MD   100 mg at 10/08/20 1030   apixaban (ELIQUIS) tablet 5 mg  5 mg Oral BID Sharen Hones, MD       atorvastatin (LIPITOR) tablet 20 mg  20 mg Oral q1800 Doutova, Anastassia, MD   20 mg at 10/07/20 1723   Chlorhexidine Gluconate Cloth 2 % PADS 6 each  6 each Topical Daily Flora Lipps, MD   6 each at 10/07/20 0930   finasteride (PROSCAR) tablet 5 mg  5 mg Oral Daily Doutova, Anastassia, MD   5 mg at 10/08/20 1030   gabapentin (NEURONTIN) capsule 100 mg  100 mg Oral q morning - 10a Doutova, Anastassia, MD   100 mg at 10/08/20 1030   gabapentin (NEURONTIN) capsule 200 mg  200 mg Oral QHS Wieting, Richard, MD   200 mg at 10/07/20 2111   ipratropium-albuterol (DUONEB) 0.5-2.5 (3) MG/3ML nebulizer solution 3 mL  3 mL Nebulization QID Sharen Hones, MD       levofloxacin (LEVAQUIN) tablet 500 mg  500 mg Oral Daily Sharen Hones, MD   500 mg at  10/08/20 1253   lidocaine (LIDODERM) 5 % 1 patch  1 patch Transdermal Q24H Sharen Hones, MD   1 patch at 10/08/20 1512   LORazepam (ATIVAN) tablet 0.5 mg  0.5 mg Oral Q8H PRN Toy Baker, MD       midodrine (PROAMATINE) tablet 10 mg  10 mg Oral TID WC Doutova, Anastassia, MD   10 mg at 10/08/20 1254   ondansetron (ZOFRAN) injection 4-8 mg  4-8 mg Intravenous Q6H PRN Flora Lipps, MD   8 mg at 10/06/20 0938   pantoprazole (PROTONIX) EC tablet 40 mg  40 mg Oral BID Toy Baker, MD   40 mg at 10/08/20 1030   polyvinyl alcohol (LIQUIFILM TEARS) 1.4 % ophthalmic solution 1 drop  1 drop Both Eyes PRN Toy Baker, MD   1 drop at 10/07/20 0929   [START ON 10/09/2020] predniSONE (DELTASONE) tablet 20 mg  20 mg Oral Q breakfast Sharen Hones, MD       sodium chloride (OCEAN) 0.65 % nasal spray 1 spray  1 spray Each Nare PRN Toy Baker, MD   1 spray at 10/06/20 0639   tamsulosin (FLOMAX) capsule 0.4 mg  0.4 mg Oral QPC supper Toy Baker, MD   0.4 mg at 10/07/20 1723    VITAL SIGNS: BP (!) 138/56 (BP Location: Right Arm)    Pulse 68    Temp 98 F (36.7 C) (Oral)    Resp 16    Ht '5\' 6"'  (1.676 m)    Wt 69.4 kg    SpO2 98%    BMI 24.69 kg/m  Filed Weights   10/04/20 1344  Weight: 69.4 kg    Estimated body mass index is 24.69 kg/m as calculated from the following:   Height as of this encounter: '5\' 6"'  (1.676 m).   Weight as of this encounter: 69.4 kg.  LABS: CBC:    Component Value Date/Time   WBC 8.9 10/06/2020 0343   HGB 10.6 (L) 10/06/2020 0343   HGB 13.3 12/12/2012 0835   HCT 31.4 (L) 10/06/2020 0343   HCT 41.8 12/12/2012 0835   PLT 341 10/06/2020 0343   PLT 245 12/12/2012 0835   Comprehensive Metabolic Panel:    Component Value Date/Time   NA 139 10/06/2020 0343   NA 138 12/12/2012 0835   K 4.5 10/06/2020 0343   K 4.0 12/12/2012 0835   BUN 24 (H) 10/06/2020 0343   BUN 17 12/12/2012 0835   CREATININE 1.26 (H) 10/06/2020 0343   CREATININE 1.26 12/12/2012 0835   ALBUMIN 2.9 (L) 10/05/2020 0536   ALBUMIN 3.8 12/12/2012 0835     Review of Systems  Respiratory: Positive for cough and shortness of breath.   Neurological: Positive for weakness.  Unless otherwise noted, a complete review of systems is negative.  Physical Exam General: NAD Cardiovascular: regular rate and rhythm Pulmonary: shortness of breath on exertions, non-productive cough Skin: no rashes, warm and dry Neurological: awake, Alert and oriented x3, mood appropriate    Prognosis: Guarded   Discharge Planning:  [place] for rehab with Palliative care service follow-up  Recommendations:  DNR/DNI-as confirmed by patient/daughter  Continue with current plan of care per medical team   Patient and family remains hopeful for continued improvement with a goal of discharging to SN[facility] re[facility] at [facility].  Patient and family requesting outpatient palliative support (TOC referral placed)  PMT will continue to support and follow as needed. Please call team line with urgent needs.   Palliative  Performance Scale: PPS 30/40%              Patient and daughter expressed understanding and was in agreement with this plan.   Thank you for allowing the Palliative Medicine Team to assist in the care of this patient.  Time In:  1015 Time Out: 1125 Time Total: 70 min.   Visit consisted of counseling and education dealing with the complex and emotionally intense issues of symptom management and palliative care in the setting of serious and potentially life-threatening illness.Greater than 50%  of this time was spent counseling and coordinating care related to the above assessment and plan.  Signed by:  Alda Lea, AGPCNP-BC Palliative Medicine Team  Phone: [phone] Pager: [phone] Amion: Bjorn Pippin

## 2020-10-08 NOTE — Evaluation (Signed)
Clinical/Bedside Swallow Evaluation Patient Details  Name: Wesley Newton MRN: [id number] Date of Birth: [birth year]  Today's Date: 10/08/2020 Time: SLP Start Time (ACUTE ONLY): 1110 SLP Stop Time (ACUTE ONLY): 1230 SLP Time Calculation (min) (ACUTE ONLY): 80 min  Past Medical History:  Past Medical History:  Diagnosis Date  . Asthma   . BPH (benign prostatic hyperplasia)   . Bronchitis   . Cancer ([place])   . COPD (chronic obstructive pulmonary disease) ([place])   . DVT of leg (deep venous thrombosis) ([place])   . Elevated PSA   . Emphysema lung ([place])   . Emphysema of lung ([place])   . Hyperlipidemia   . Nocturia   . Pulmonary fibrosis ([place])   . Pulmonary fibrosis ([place])   . Skin cancer    Past Surgical History:  Past Surgical History:  Procedure Laterality Date  . CATARACT EXTRACTION    . HERNIA REPAIR    . TONSILLECTOMY     HPI:  Pt is a 86 y.o. male with medical history significant of Esophaegal phase dysmotility/dypshagia, COPD, pulmonary fibrosis, with history of DVT, HLD, A. Fib, PAD BPH.  Post admit for acute on chronic respiratory failure after having worsening shortness of breath and hypotension, pt had code blue with subsequent CPR on 10/19 s/p patient choking on his medicines.  Per chart history, pt/family, patient has history of Esophageal phase dysmotility/dysphagia and multiple pneumonias a year.  There is concern for recurrent aspiration pneumonia -- possibly from Esophageal dysmotility/dysphagia as noted on UGI study performed in 04/2019: "Tertiary contractions throughout the esophagus as can be seen with esophageal spasm; Severe gastroesophageal reflux extending to the cervical esophagus".   Assessment / Plan / Recommendation Clinical Impression  Pt seen today for ongoing assessment of toleration of current regular diet; any concerns for oropharyngeal phase swallowing issues. Pt has a long-standing h/o Esophageal phase Dysmotility/dysphagia per chart notes; SLP assessments.  Per UGI in 04/2019, pt has "Tertiary contractions throughout the esophagus as can be seen with esophageal spasm; Severe gastroesophageal reflux extending to the cervical Esophagus". Pt also endorsed difficulty w/ Phlegm on a DAILY basis baseline(during the night, mornings) and difficulty swallowing certain Pills(baseline) -- Pill swallowing difficulty noted this admit. ANY Esophageal dysmotility can increase risk for regurgitation and aspiration of such regurgitation/Reflux. Noted recent CXRs, and current this admission. Pt consumed po trials of thin liquids, purees and soft solids w/ NO overt, clinical s/s of aspiration noted to the swallow; no overt neuromuscular deficits noted. No overt coughing or decline in respiratory presentation noted during/post trials; pt exhibited the same mild throat clearing b/t trials intermittently as noted at his Baseline PRIOR to any po's given. This did not increase in presentation and suspect it could be related to Esophageal dysmotility; Consistent Belching noted during/post po trials. Pt endorsed other s/s of REFLUX as well(pressure mid-sternum) and described his h/o Phlegm at night and in mornings upon awaking. Suspect this is a s/s of REFLUX as well. Pt fed himself; NO straws were used to avoid increased air swallowed. Oral phase appeared [facility] w/ bolus management and oral clearing. OM exam appeared [facility] w/ no unilateral weakness noted. Pt fed self.  Discussed at length and gave education on Esophageal Dysmotility (as noted per UGI), its impact on swallowing, REFLUX and general aspiration precautions. Recommended f/u w/ GI for further management and Education of pt's Esophageal Dysmotility as ANY Esophageal Dysmotility can increase risk for regurgitation and aspiration of such regurgitation/Reflux. Pt stated he takes a PPI 2x daily.  Also recommended f/u w/ Dietician for dietary support, education w/ eating frequent, mini meals vs larger meals. He drinks Ensure currently.    Recommend continue current diet as ordered w/ more soft, Cut, moist foods already broken down; Soups. Recommended ALL Pills Whole in Puree for easier, safer swallowing. General aspiration precautions; STRICT REFLUX PRECAUTIONS. SLP Visit Diagnosis: Dysphagia, pharyngoesophageal phase (R13.14) (Esophageal phase dysmotility baseline)    Aspiration Risk  Mild aspiration risk;Risk for inadequate nutrition/hydration (reduced following precautions)    Diet Recommendation  More of a Mech Soft diet consistency(meats Cut, foods moistened well); Thin liquids -- NO STRAWS. General aspiration precautions. STRICT REFLUX PRECAUTIONS  Medication Administration: Whole meds with puree (for safer swallowing)    Other  Recommendations Recommended Consults: Consider GI evaluation;Consider esophageal assessment (Dietician f/u) Oral Care Recommendations: Oral care BID;Staff/trained caregiver to provide oral care Other Recommendations:  (n/a)   Follow up Recommendations None      Frequency and Duration min 1 x/week  1 week       Prognosis Prognosis for Safe Diet Advancement: Fair Barriers to Reach Goals: Time post onset;Severity of deficits (Baseline issues)      Swallow Study   General Date of Onset: 10/04/20 HPI: Pt is a 86 y.o. male with medical history significant of Esophaegal phase dysmotility/dypshagia, COPD, pulmonary fibrosis, with history of DVT, HLD, A. Fib, PAD BPH.  Post admit for acute on chronic respiratory failure after having worsening shortness of breath and hypotension, pt had code blue with subsequent CPR on 10/19 s/p patient choking on his medicines.  Per chart history, pt/family, patient has history of Esophageal phase dysmotility/dysphagia and multiple pneumonias a year.  There is concern for recurrent aspiration pneumonia -- possibly from Esophageal dysmotility/dysphagia as noted on UGI study performed in 04/2019: "Tertiary contractions throughout the esophagus as can be seen with  esophageal spasm; Severe gastroesophageal reflux extending to the cervical esophagus". Type of Study: Bedside Swallow Evaluation Previous Swallow Assessment: 2019(mbss); 2020 Diet Prior to this Study: Regular;Thin liquids Temperature Spikes Noted: No (wbc 8.9) Respiratory Status: Nasal cannula (3-4L) History of Recent Intubation: No Behavior/Cognition: Alert;Cooperative;Pleasant mood Oral Cavity Assessment: Within Functional Limits Oral Care Completed by SLP: Recent completion by staff Oral Cavity - Dentition: Adequate natural dentition;Missing dentition Vision: Functional for self-feeding Self-Feeding Abilities: Able to feed self;Needs set up Patient Positioning: Upright in chair Baseline Vocal Quality: Normal (min gravely) Volitional Cough: Strong Volitional Swallow: Able to elicit    Oral/Motor/Sensory Function Overall Oral Motor/Sensory Function: Within functional limits   Ice Chips Ice chips: Not tested   Thin Liquid Thin Liquid: Within functional limits Presentation: Cup;Self Fed (8-9 trials; water drinking in room)    Nectar Thick Nectar Thick Liquid: Not tested   Honey Thick Honey Thick Liquid: Not tested   Puree Puree: Within functional limits Presentation: Self Fed;Spoon (supported; 5 trials)   Solid     Solid: Within functional limits Presentation: Self Fed (3 trials; then sandwich)        Orinda Kenner, MS, CCC-SLP Speech Language Pathologist Rehab Services [phone] Yumna Ebers 10/08/2020,3:40 PM

## 2020-10-08 NOTE — Progress Notes (Signed)
Occupational Therapy Treatment Patient Details Name: Wesley Newton MRN: [id number] DOB: 02-15-1927 Today's Date: 10/08/2020    History of present illness 86 y.o. male with medical history significant of COPD, pulmonary fibrosis, with history of DVT, HLD, A. Fib, PAD BPH, admitted for acute on chronic respiratory failure after having worsening shortness of breath and hypotension prior to admission. Pt had code blue with subsequent CPR on 10/19 after arrival.   OT comments  [name] continues to present with generalized weakness and limited endurance, along with chest pain today, 2/2 rib fx during CPR two days ago. He is pleasant, alert, A&Ox4, daughter present in room. [name] had been sitting in recliner for several hours and expressed his interest in getting back in bed. Engaged first in grooming, UB dressing, requiring set-up/SUPV. Transitioned from recliner to bed with RW and CGA, but secondary to chest pain required ModA to reposition in bed. [name] continues to benefit from OT services while hospitalized, to address strength, balance, and endurance. Discharge to SNF within pt's [place] remains appropriate.   Follow Up Recommendations  Home health OT;SNF    Equipment Recommendations  None recommended by OT    Recommendations for Other Services      Precautions / Restrictions Precautions Precautions: Fall Restrictions Weight Bearing Restrictions: No Other Position/Activity Restrictions: Pt's code status now changed to DNR.       Mobility Bed Mobility Overal bed mobility: Needs Assistance Bed Mobility: Sit to Supine     Supine to sit: Mod assist     General bed mobility comments: Pt required increased time but no physical assist for sit<supine. Required ModA to adjust position in bed.  Transfers Overall transfer level: Needs assistance Equipment used: Rolling walker (2 wheeled) Transfers: Sit to/from Stand Sit to Stand: Min assist         General  transfer comment: Pt able to move sit<stand with CGA and RW, slow pivot transfer to EOB. O2 sats steady at 90%    Balance Overall balance assessment: Needs assistance   Sitting balance-Leahy Scale: Good     Standing balance support: Bilateral upper extremity supported;During functional activity Standing balance-Leahy Scale: Fair Standing balance comment: Relies on UE support throughout                           ADL either performed or assessed with clinical judgement   ADL Overall ADL's : Needs assistance/impaired Eating/Feeding: Independent;Set up   Grooming: Set up;Supervision/safety   Upper Body Bathing: Supervision/ safety;Set up       Upper Body Dressing : Set up;Supervision/safety                           Vision Baseline Vision/History: Wears glasses Wears Glasses: At all times Patient Visual Report: No change from baseline     Perception     Praxis      Cognition Arousal/Alertness: Awake/alert Behavior During Therapy: WFL for tasks assessed/performed Overall Cognitive Status: Within Functional Limits for tasks assessed                                          Exercises     Shoulder Instructions       General Comments      Pertinent Vitals/ Pain       Pain Location: pain in  chest 2/2 rib fx following CPR  Home Living Family/patient expects to be discharged to:: Skilled nursing facility Living Arrangements: Alone Available Help at Discharge: Family;Available PRN/intermittently Type of Home: Apartment Home Access: Level entry     Home Layout: One level     Bathroom Shower/Tub: Tub/shower unit     Bathroom Accessibility: Yes   Home Equipment: Walker - 2 wheels;Cane - single point;Tub bench   Additional Comments: [facility], in independent living section      Prior Functioning/Environment Level of Independence: Independent with assistive device(s)        Comments: patient reports using cane  in home, rolling walker out of the home and uses 2L oxygen reguarly at home (increased requirement recently). patient states he has occasional meals prepared at [facility] and has a housekeeper that helps once per month with cleaning. patient does occasionally drive, though daughter will often take him out   Frequency  Min 2X/week        Progress Toward Goals  OT Goals(current goals can now be found in the care plan section)     Acute Rehab OT Goals Patient Stated Goal: would like to be able to "breath well again" and to "get my strength back" OT Goal Formulation: With patient Time For Goal Achievement: 10/19/20 Potential to Achieve Goals: Good  Plan      Co-evaluation                 AM-PAC OT "6 Clicks" Daily Activity     Outcome Measure   Help from another person eating meals?: None Help from another person taking care of personal grooming?: A Little Help from another person toileting, which includes using toliet, bedpan, or urinal?: A Little Help from another person bathing (including washing, rinsing, drying)?: A Little Help from another person to put on and taking off regular upper body clothing?: A Little Help from another person to put on and taking off regular lower body clothing?: A Lot 6 Click Score: 18    End of Session Equipment Utilized During Treatment: Rolling walker  OT Visit Diagnosis: Unsteadiness on feet (R26.81);Muscle weakness (generalized) (M62.81)   Activity Tolerance Patient limited by fatigue;Patient tolerated treatment well   Patient Left in bed;with call bell/phone within reach;with family/visitor present;with bed alarm set   Nurse Communication          Time: 3329-5188 OT Time Calculation (min): 24 min  Charges: OT General Charges $OT Visit: 1 Visit OT Treatments $Self Care/Home Management : 23-37 mins  Wesley Lobo, PhD, [place], OTR/L ascom [phone] 10/08/20, 2:54 PM

## 2020-10-08 NOTE — Evaluation (Signed)
Physical Therapy ReEvaluation Patient Details Name: Wesley Newton MRN: [id number] DOB: 1927/08/24 Today's Date: 10/08/2020   History of Present Illness  87 y.o. male with medical history significant of COPD, pulmonary fibrosis, with history of DVT, HLD, A. Fib, PAD BPH, admitted for acute on chronic respiratory failure after having worsening shortness of breath and hypotension prior to admission. Pt had code blue with subsequent CPR on 10/19 after arrival.    Clinical Impression  Pt anxious t/o the PT re-eva, but was able and willing to participate and did manage to do some mobility and limited ambulation.  First standing attempt was to use urinal at EOB, 4L donned and pt's stats dropped from mid/low 90s to low 80s with ~1 minute of static standing.  Spoke with nurse and bumped to 6L with sats recovering to mid 90s in ~3 minutes with focused breathing.  Remained on 6L for modest in room ambulation with standing rest break at ~5 ft, with 5 additional feet however he was very fatigued and O2 again dropped to low 80s even on the 6L.  Pt fatigued (and continues to describe himself as anxious with all activity.) Initiated seated exercises but even with modest effort sats dropped.  Deferred further pushing him and assisted with getting back to eating breakfast.  Daughter in room and helpful t/o the session, some further history/clarification gathered from her.      Follow Up Recommendations SNF    Equipment Recommendations  None recommended by PT    Recommendations for Other Services       Precautions / Restrictions Precautions Precautions: Fall Restrictions Weight Bearing Restrictions: No      Mobility  Bed Mobility Overal bed mobility: Modified Independent Bed Mobility: Supine to Sit     Supine to sit: Min guard     General bed mobility comments: Pt slow to get to sitting, but with rail use was able to get to upright w/o assist    Transfers Overall transfer level: Needs  assistance Equipment used: Rolling walker (2 wheeled) Transfers: Sit to/from Stand Sit to Stand: Min assist         General transfer comment: Pt was able to get to standing from standard height bed w/ only light assist.  Pt quick to fatigue with the effort and on 4L O2 had sats drop to the low 80s on first static standing attempt (~1 minute), able to rise w/o direct assist on second attempt but again quick to fatigue.  Ambulation/Gait Ambulation/Gait assistance: Mod assist Gait Distance (Feet): 10 Feet Assistive device: Rolling walker (2 wheeled)       General Gait Details: very slow and labored ambulation that was highly reliant on the walker.  6L t/o the effort with rest break at 5 ft.  Shuffling but safe cadence, cues t/o for deep breathing and encouragement to do what he could but not push it too much.    Stairs            Wheelchair Mobility    Modified Rankin (Stroke Patients Only)       Balance Overall balance assessment: Needs assistance Sitting-balance support: Feet supported Sitting balance-Leahy Scale: Good Sitting balance - Comments: did not come to EOB sitting   Standing balance support: Bilateral upper extremity supported;During functional activity Standing balance-Leahy Scale: Fair Standing balance comment: patient relying on rolling walker for support but is able demonstrate brief periods with steady static standing balance without UE support  Pertinent Vitals/Pain Pain Assessment: Faces Faces Pain Scale: Hurts little more Pain Location: pain in chest 2/2 rib fx following CPR    Home Living Family/patient expects to be discharged to:: [place]: Alone (live in independent living at [facility])             Home Equipment: Gilford Rile - 2 wheels;Cane - single point;Tub bench      Prior Function Level of Independence: Independent with assistive device(s)          Comments: patient reports using cane in home, rolling walker out of the home and uses 2L oxygen reguarly at home (increased requirement recently). patient states he has occasional meals prepared at [facility] and has a housekeeper that helps once per month with cleaning. patient does occasionally drive, though daughter will often take him out     Hand Dominance        Extremity/Trunk Assessment   Upper Extremity Assessment Upper Extremity Assessment: Generalized weakness    Lower Extremity Assessment Lower Extremity Assessment: Generalized weakness       Communication   Communication: No difficulties;HOH  Cognition Arousal/Alertness: Awake/alert Behavior During Therapy: WFL for tasks assessed/performed;Anxious Overall Cognitive Status: Within Functional Limits for tasks assessed                                        General Comments      Exercises General Exercises - Lower Extremity Long Arc Quad: Strengthening;10 reps   Assessment/Plan    PT Assessment Patient needs continued PT services  PT Problem List Decreased strength;Decreased activity tolerance;Decreased balance;Decreased mobility;Cardiopulmonary status limiting activity;Decreased safety awareness;Decreased knowledge of use of DME;Pain       PT Treatment Interventions DME instruction;Stair training;Functional mobility training;Therapeutic activities;Balance training;Therapeutic exercise;Neuromuscular re-education    PT Goals (Current goals can be found in the Care Plan section)  Acute Rehab PT Goals Patient Stated Goal: would like to be able to "breath well again" and to "get my strength back" PT Goal Formulation: With patient Time For Goal Achievement: 10/22/20 Potential to Achieve Goals: Fair    Frequency Min 2X/week   Barriers to discharge Decreased caregiver support      Co-evaluation               AM-PAC PT "6 Clicks" Mobility  Outcome Measure Help needed turning from  your back to your side while in a flat bed without using bedrails?: A Little Help needed moving from lying on your back to sitting on the side of a flat bed without using bedrails?: A Little Help needed moving to and from a bed to a chair (including a wheelchair)?: A Little Help needed standing up from a chair using your arms (e.g., wheelchair or bedside chair)?: A Little Help needed to walk in hospital room?: A Little Help needed climbing 3-5 steps with a railing? : A Lot 6 Click Score: 17    End of Session Equipment Utilized During Treatment: Oxygen (4L at rest, 6L with activity) Activity Tolerance: Patient limited by fatigue;Patient limited by lethargy Patient left: in bed;with call bell/phone within reach   PT Visit Diagnosis: Muscle weakness (generalized) (M62.81);Unsteadiness on feet (R26.81);Difficulty in walking, not elsewhere classified (R26.2)    Time: 5638-7564 PT Time Calculation (min) (ACUTE ONLY): 31 min   Charges:   PT Evaluation $PT Re-evaluation: 1 Re-eval PT Treatments $Therapeutic Activity: 8-22 mins  Kreg Shropshire, DPT 10/08/2020, 10:24 AM

## 2020-10-08 NOTE — Care Management Important Message (Signed)
Important Message  Patient Details  Name: Wesley Newton MRN: [id number] Date of Birth: 02-22-1927   Medicare Important Message Given:  Yes     [name] 10/08/2020, 11:09 AM

## 2020-10-08 NOTE — TOC Progression Note (Signed)
Transition of Care [facility]) - Progression Note    Patient Details  Name: Wesley Newton MRN: [id number] Date of Birth: August 11, 1927  Transition of Care [facility]) CM/SW [place], RN Phone Number: 10/08/2020, 3:26 PM  Clinical Narrative:   RNCM started insurance authorization through the [facility] for patient to return to [facility].     Expected Discharge Plan: [place] Barriers to Discharge: Continued Medical Work up  Expected Discharge Plan and Services Expected Discharge Plan: [place]   Discharge Planning Services: CM Consult Post Acute Care Choice: [place] Living arrangements for the past 2 months: [place]                                       Social Determinants of Health (SDOH) Interventions    Readmission Risk Interventions No flowsheet data found.

## 2020-10-09 LAB — CBC
HCT: 33.1 % — ABNORMAL LOW (ref 39.0–52.0)
Hemoglobin: 11 g/dL — ABNORMAL LOW (ref 13.0–17.0)
MCH: 28.7 pg (ref 26.0–34.0)
MCHC: 33.2 g/dL (ref 30.0–36.0)
MCV: 86.4 fL (ref 80.0–100.0)
Platelets: 344 10*3/uL (ref 150–400)
RBC: 3.83 MIL/uL — ABNORMAL LOW (ref 4.22–5.81)
RDW: 14.2 % (ref 11.5–15.5)
WBC: 13 10*3/uL — ABNORMAL HIGH (ref 4.0–10.5)
nRBC: 0 % (ref 0.0–0.2)

## 2020-10-09 LAB — CULTURE, BLOOD (ROUTINE X 2)
Culture: NO GROWTH
Culture: NO GROWTH
Special Requests: ADEQUATE

## 2020-10-09 LAB — CREATININE, SERUM
Creatinine, Ser: 1.28 mg/dL — ABNORMAL HIGH (ref 0.61–1.24)
GFR, Estimated: 53 mL/min — ABNORMAL LOW (ref >60)

## 2020-10-09 MED ORDER — APIXABAN 2.5 MG PO TABS
5.0000 mg | ORAL_TABLET | Freq: Two times a day (BID) | ORAL | Status: DC
Start: 2020-10-09 — End: 2020-11-26

## 2020-10-09 MED ORDER — LORAZEPAM 0.5 MG PO TABS: 0.5000 mg | ORAL_TABLET | Freq: Three times a day (TID) | ORAL | 0 refills | Status: AC | PRN

## 2020-10-09 NOTE — Progress Notes (Signed)
Speech Language Pathology Treatment: Dysphagia  Patient Details Name: Wesley Newton MRN: [id number] DOB: April 11, 1927 Today's Date: 10/09/2020 Time: 0930-1020 SLP Time Calculation (min) (ACUTE ONLY): 50 min  Assessment / Plan / Recommendation Clinical Impression  Pt seen today for f/u toleration of diet and education on Esophageal phase dysmotility and its impact on pharyngeal swallowing; eating/drinking in general. Dtr present in room. Pt sitting in chair; engaged appropriately, alert.  Discussed w/ pt and Dtrthe importance of Strict REFLUX Precautions and diet consistencyincluding foods Cut Small, especially meats, moistened cooked foods for ease of clearing the Esophagus. Monitoring eating behaviors at meals and scheduling for frequent, mini meals will aid ease of Esophageal clearing and improve overall oral intake.  Pt, family, and NSG denied anyovert s/s of aspirationw/ oral intake at recent meals or w/ Pill swallowing this AM -- pt swallowed ONE AT A TIME IN TSP OF APPLESAUCE helping to do this himself. This behavior increases pt's safety w/ Pill swallowing.  Pt has a long-standing h/o Esophageal phase Dysmotility/dysphagia per chart notes; SLP assessments. Per UGI in 04/2019, pt has "Tertiary contractions throughout the esophagus as can be seen with esophageal spasm; Severe gastroesophageal reflux extending to the cervical Esophagus". Pt also endorsed difficulty w/ Phlegm baseline(during the night, mornings) and difficulty swallowing certain Pills(baseline) -- Pill swallowing difficulty was noted this admit. ANY Esophageal dysmotility can increase risk for regurgitation and aspiration of such regurgitation/Reflux. Pt consumed sips of thin liquids w/ no overt clinical s/s of aspiration. Pt has Baseline Burping/Belching d/t Esophageal dysmotility. Discussed followinggeneral aspiration precautions to include Small sips Slowly, No strawsif increased coughing noted, and Sit fully Upright w/ all  oral intake supporting both back and head Upright. Recommended reducing distractions including Talking at meals and Time b/t bites/sips for Belching(relieve air).Also discussed food consistencies and options, preparation, and use of condiments to soften foodsalso. STRONG Encouragement for ALL Pills to be swallowed using a Puree such as applesauce, yogurt, pudding from this time forward d/t Baseline GI/Esophageal Dysmotility. Handouts given on ALL of the above including aspiration and REFLUX precautions andPill swallowing. Education completed on Esophageal dysmotility/dysphagia and pharyngeal swallowing; aspiration and aspiration precautions; food prep for d/c. Pt/Dtr questions answered.  NO overt oropharyngeal phase dysphagia immediately suspected but based on pt's Baseline Esophageal phase dysmotility/dysphagia, he can be at increased risk for aspiration from Esophageal Backflow/Regurgitation of REFLUX material, thus Pulmonary impact. Recommend a moreDysphagia 3 diet w/ thin liquids for the cut meats, moistened foods. No straws recommended.MD & NSG updated. Education, handouts provided to pt/Dtr. No further ST services indicated at this time. NSG to reconsult if new issues arise during admit. Recommend f/u w/ GI and Dietician for ongoing support and management, education.       HPI HPI: Pt is a 86 y.o. male with medical history significant of Esophaegal phase dysmotility/dypshagia, COPD, pulmonary fibrosis, with history of DVT, HLD, A. Fib, PAD BPH.  Post admit for acute on chronic respiratory failure after having worsening shortness of breath and hypotension, pt had code blue with subsequent CPR on 10/19 s/p patient choking on his medicines.  Per chart history, pt/family, patient has history of Esophageal phase dysmotility/dysphagia and multiple pneumonias a year.  There is concern for recurrent aspiration pneumonia -- possibly from Esophageal dysmotility/dysphagia as noted on UGI study performed in  04/2019: "Tertiary contractions throughout the esophagus as can be seen with esophageal spasm; Severe gastroesophageal reflux extending to the cervical esophagus".      SLP Plan  All goals met  Recommendations  Diet recommendations: Dysphagia 3 (mechanical soft);Thin liquid (cut meats, moistened foods, soups) Liquids provided via: Cup;No straw Medication Administration: Whole meds with puree (for safer, easier swallowing) Supervision: Patient able to self feed Compensations: Minimize environmental distractions;Slow rate;Small sips/bites;Follow solids with liquid Postural Changes and/or Swallow Maneuvers: Out of bed for meals;Seated upright 90 degrees;Upright 30-60 min after meal (Time for belching; Reflux precautions)                General recommendations:  (Dietician and GI f/u) Oral Care Recommendations: Oral care BID;Oral care prior to ice chip/H20;Patient independent with oral care Follow up Recommendations: None SLP Visit Diagnosis: Dysphagia, pharyngoesophageal phase (R13.14) (impact from Esophageal phase dysmotility baseline) Plan: All goals met       GO                  Orinda Kenner, MS, CCC-SLP Speech Language Pathologist Rehab Services [phone] [facility] 10/09/2020, 1:14 PM

## 2020-10-09 NOTE — Discharge Summary (Signed)
Physician Discharge Summary  Patient ID: Wesley Newton MRN: [id number] DOB/AGE: December 21, 1926 86 y.o.  Admit date: 10/04/2020 Discharge date: 10/09/2020  Admission Diagnoses:  Discharge Diagnoses:  Active Problems:   BPH (benign prostatic hyperplasia)   COPD with acute exacerbation (HCC)   GERD (gastroesophageal reflux disease)   Hypercholesterolemia   HTN (hypertension)   PAD (peripheral artery disease) (HCC)   Idiopathic pulmonary fibrosis (HCC)   Acute on chronic respiratory failure (HCC)   AF (paroxysmal atrial fibrillation) ([place])   Acute respiratory distress   Cardiac arrest [facility])   Hypoxia   Discharged Condition: good  Hospital Course:  Wenzel Backlund Faucetteis a 86 y.o.malewith medical history significant ofCOPD, pulmonary fibrosis,history of DVT, HLD, A. Fib, PAD BPH. He presented withshortness of breath and hypertension for past 4 days. Usually he is on 2 L of nasal cannula at home but had to increase it after 3.He checkedhis pulse ox this morning was in the 60s.  He is diagnosed with COP exacerbation, he was treated with antibiotics and steroids. 10/19, patient choked on his medicines. He developed acute respite failure, he became unresponsive. He also developed cardiac arrest, ACIS started. Patient had received chest compression. 10/20.Discussed with patient family, patient has history of multiple pneumonias a year. He also has some dysphagia in the past. He probably had a recurrent aspiration pneumonia in the past.   10/22.  Patient condition has improved to baseline.  Discussed with speech therapist, patient does not have pharyngeal phase of dysphagia, he has significant dysmotility of esophagus causing him to have choking episodes.  Patient and family counseled extensively to avoid future choking.  It appears that patient cardiac arrest in the hospital was also caused by episode of choking, patient has back to baseline, he has completed 4 days antibiotics, no  additional antibiotics needed. Patient will be discharged to nursing home with palliative care consult.  1. Acute on chronic hypoxemic respiratory failure secondary to choking and aspiration pneumonia. Condition improving, wean oxygen.  #2. Aspiration Pneumonia. Condition improved.  I suspect a minimal bacterial infection, completed 4 days antibiotics.  3. Chest pain due to rib fractures. Improving, continue Lidoderm patch.  4. COPD exacerbation and idiopathic pulmonary fibrosis. Patient has back to baseline.  5. PAF. Continue anticoagulation.  Based on patient renal function, he was put back on 5 mg twice a day with Eliquis.  6. CKD 3a. Follow.  7. Cardiac arrest.  Echo 10/18 showed normal EF, no significant valvular diseases.    Consults: pulmonary/intensive care  Significant Diagnostic Studies:   Treatments: antibiotics, steroids, ACLS  Discharge Exam: Blood pressure 129/63, pulse 66, temperature 97.9 F (36.6 C), temperature source Oral, resp. rate 16, height 5\' 6"  (1.676 m), weight 69.4 kg, SpO2 97 %. General appearance: alert and cooperative Resp: clear to auscultation bilaterally Cardio: regular rate and rhythm, S1, S2 normal, no murmur, click, rub or gallop GI: soft, non-tender; bowel sounds normal; no masses,  no organomegaly Extremities: extremities normal, atraumatic, no cyanosis or edema  Disposition: Discharge disposition: 03-Skilled Nursing Facility       Discharge Instructions    Diet - low sodium heart healthy   Complete by: As directed    Increase activity slowly   Complete by: As directed      Allergies as of 10/09/2020      Reactions   Amoxicillin Other (See Comments)   DID THE REACTION INVOLVE: Swelling of the face/tongue/throat, SOB, or low BP? Unknown Sudden or severe rash/hives, skin peeling, or the  inside of the mouth or nose? Unknown Did it require medical treatment? Unknown When did it last happen?Unknown If all above  answers are "NO", may proceed with cephalosporin use.   Codeine Nausea And Vomiting   Rapaflo [silodosin] Other (See Comments)   Reaction:  Unknown    Tramadol Nausea And Vomiting, Other (See Comments)   Pt states that this medication makes him feel crazy.     Trimethoprim Other (See Comments)   Reaction: unknown      Medication List    TAKE these medications   albuterol 108 (90 Base) MCG/ACT inhaler Commonly known as: VENTOLIN HFA Inhale 2 puffs into the lungs every 6 (six) hours as needed for wheezing or shortness of breath.   amiodarone 200 MG tablet Commonly known as: PACERONE Take 1 tablet (200 mg total) by mouth 2 (two) times daily. What changed:   how much to take  when to take this   apixaban 2.5 MG Tabs tablet Commonly known as: ELIQUIS Take 2 tablets (5 mg total) by mouth 2 (two) times daily. What changed: how much to take   atorvastatin 20 MG tablet Commonly known as: LIPITOR Take 1 tablet (20 mg total) by mouth daily at 6 PM.   azelastine 0.1 % nasal spray Commonly known as: ASTELIN Place 1 spray into both nostrils daily.   budesonide 0.5 MG/2ML nebulizer solution Commonly known as: PULMICORT Take 0.5 mg by nebulization daily.   finasteride 5 MG tablet Commonly known as: PROSCAR Take 1 tablet (5 mg total) by mouth daily.   fluticasone 50 MCG/ACT nasal spray Commonly known as: FLONASE Place 2 sprays into both nostrils daily.   gabapentin 100 MG capsule Commonly known as: NEURONTIN Take 100-200 mg by mouth 2 (two) times daily. Take 1 capsule (100mg ) by mouth every morning and 2 capsules (200mg ) by mouth every night.   ipratropium-albuterol 0.5-2.5 (3) MG/3ML Soln Commonly known as: DUONEB Inhale 3 mLs into the lungs 4 (four) times daily.   LORazepam 0.5 MG tablet Commonly known as: ATIVAN Take 1 tablet (0.5 mg total) by mouth every 8 (eight) hours as needed for anxiety.   midodrine 10 MG tablet Commonly known as: PROAMATINE Take 1 tablet (10  mg total) by mouth 3 (three) times daily with meals.   naphazoline-glycerin 0.012-0.2 % Soln Commonly known as: CLEAR EYES REDNESS Place 1-2 drops into both eyes 4 (four) times daily as needed for eye irritation.   OCUVITE ADULT 50+ PO Take 1 capsule by mouth daily.   pantoprazole 40 MG tablet Commonly known as: PROTONIX Take 1 tablet (40 mg total) by mouth 2 (two) times daily.   predniSONE 10 MG tablet Commonly known as: DELTASONE Take 10 mg by mouth daily.   tamsulosin 0.4 MG Caps capsule Commonly known as: FLOMAX TAKE 2 CAPSULES BY MOUTH EVERY DAY What changed:   how much to take  when to take this   triamcinolone ointment 0.1 % Commonly known as: KENALOG Apply 1 application topically as directed. Apply 2-3 times daily       Contact information for follow-up providers    Baxter Hire, MD Follow up in 1 week(s).   Specialty: Internal Medicine Contact information: [place] [address] [phone]            Contact information for after-discharge care    Destination    [facility] .   Service: Skilled Nursing Contact information: [place] [phone]  37 minutes  Signed: Sharen Hones 10/09/2020, 9:58 AM

## 2020-10-09 NOTE — Plan of Care (Signed)

## 2020-10-09 NOTE — Progress Notes (Signed)
Pt discharged to [facility] via EMS.  Discharge packet given to EMS drivers. Vital signs stable, no s/s of distress.

## 2020-10-09 NOTE — Progress Notes (Signed)
Pt to d/c to SNF [facility], Report given to [employer], awaiting transportation.

## 2020-10-09 NOTE — TOC Progression Note (Signed)
Transition of Care [facility]) - Progression Note    Patient Details  Name: DELDRICK Newton MRN: [id number] Date of Birth: 1927/08/04  Transition of Care [facility]) CM/SW [place], RN Phone Number: 10/09/2020, 9:27 AM  Clinical Narrative:   RNCM received phone call from [facility] manager requesting additional information regarding patient's living situation and a copy of his face sheet.  RNCM uploaded face sheet through the [employer] portal.     Expected Discharge Plan: [place] Barriers to Discharge: Continued Medical Work up  Expected Discharge Plan and Services Expected Discharge Plan: [place]   Discharge Planning Services: CM Consult Post Acute Care Choice: [place] Living arrangements for the past 2 months: [place]                                       Social Determinants of Health (SDOH) Interventions    Readmission Risk Interventions No flowsheet data found.

## 2020-10-09 NOTE — Progress Notes (Signed)
Physical Therapy Treatment Patient Details Name: Wesley Newton MRN: [id number] DOB: 1927/03/05 Today's Date: 10/09/2020    History of Present Illness 86 y.o. male with medical history significant of COPD, pulmonary fibrosis, with history of DVT, HLD, A. Fib, PAD BPH, admitted for acute on chronic respiratory failure after having worsening shortness of breath and hypotension prior to admission. Pt had code blue with subsequent CPR on 10/19 after arrival.    PT Comments    Pt ready for session.  Anticipates transfer to [facility] today but wants to get up to chair.  Increased time for mobility skills but has progresses to min guard/assist for activities.  Able to step to recliner but limited to due to O2 sats.  2 lpm at baseline but increased to 4 lpm for mobility with sats in mid 80's.  Overall tolerated activity well today but pain and O2 sats remain a barrier.   Follow Up Recommendations  SNF     Equipment Recommendations       Recommendations for Other Services       Precautions / Restrictions Precautions Precautions: Fall Restrictions Weight Bearing Restrictions: No Other Position/Activity Restrictions: Pt's code status now changed to DNR.    Mobility  Bed Mobility Overal bed mobility: Needs Assistance Bed Mobility: Supine to Sit Rolling: Supervision;Min guard            Transfers Overall transfer level: Needs assistance Equipment used: Rolling walker (2 wheeled) Transfers: Sit to/from Stand Sit to Stand: Min assist            Ambulation/Gait Ambulation/Gait assistance: Min Web designer (Feet): 4 Feet Assistive device: Rolling walker (2 wheeled) Gait Pattern/deviations: Step-to pattern Gait velocity: decreased   General Gait Details: short steps to recliner but less assist today.  no LOB or buckling limited primarily due to O2 sats and fatigue/chest pain   Stairs             Wheelchair Mobility    Modified Rankin (Stroke Patients  Only)       Balance Overall balance assessment: Needs assistance Sitting-balance support: Feet supported Sitting balance-Leahy Scale: Good     Standing balance support: Bilateral upper extremity supported;During functional activity Standing balance-Leahy Scale: Fair Standing balance comment: Relies on UE support throughout                            Cognition Arousal/Alertness: Awake/alert Behavior During Therapy: WFL for tasks assessed/performed Overall Cognitive Status: Within Functional Limits for tasks assessed                                        Exercises      General Comments        Pertinent Vitals/Pain Pain Assessment: Faces Faces Pain Scale: Hurts even more Pain Location: pain in chest 2/2 rib fx Pain Descriptors / Indicators: Sore Pain Intervention(s): Limited activity within patient's tolerance;Repositioned    Home Living                      Prior Function            PT Goals (current goals can now be found in the care plan section) Progress towards PT goals: Progressing toward goals    Frequency    Min 2X/week      PT Plan Current plan remains appropriate  Co-evaluation              AM-PAC PT "6 Clicks" Mobility   Outcome Measure  Help needed turning from your back to your side while in a flat bed without using bedrails?: A Little Help needed moving from lying on your back to sitting on the side of a flat bed without using bedrails?: A Little Help needed moving to and from a bed to a chair (including a wheelchair)?: A Little Help needed standing up from a chair using your arms (e.g., wheelchair or bedside chair)?: A Little Help needed to walk in hospital room?: A Little Help needed climbing 3-5 steps with a railing? : A Lot 6 Click Score: 17    End of Session Equipment Utilized During Treatment: Oxygen Activity Tolerance: Patient limited by fatigue;Patient limited by lethargy Patient  left: in chair;with call bell/phone within reach;with family/visitor present;with chair alarm set Nurse Communication: Mobility status       Time: 7711-6579 PT Time Calculation (min) (ACUTE ONLY): 15 min  Charges:  $Therapeutic Activity: 8-22 mins                    Chesley Noon, PTA 10/09/20, 9:57 AM

## 2020-10-12 DIAGNOSIS — J441 Chronic obstructive pulmonary disease with (acute) exacerbation: Secondary | ICD-10-CM

## 2020-10-12 DIAGNOSIS — F39 Unspecified mood [affective] disorder: Secondary | ICD-10-CM

## 2020-10-12 DIAGNOSIS — Z8674 Personal history of sudden cardiac arrest: Secondary | ICD-10-CM

## 2020-10-12 DIAGNOSIS — J9600 Acute respiratory failure, unspecified whether with hypoxia or hypercapnia: Secondary | ICD-10-CM | POA: Diagnosis not present

## 2020-10-12 DIAGNOSIS — N400 Enlarged prostate without lower urinary tract symptoms: Secondary | ICD-10-CM

## 2020-10-12 DIAGNOSIS — J690 Pneumonitis due to inhalation of food and vomit: Secondary | ICD-10-CM | POA: Diagnosis not present

## 2020-10-12 DIAGNOSIS — I480 Paroxysmal atrial fibrillation: Secondary | ICD-10-CM

## 2020-10-12 DIAGNOSIS — I951 Orthostatic hypotension: Secondary | ICD-10-CM

## 2020-10-22 DIAGNOSIS — F39 Unspecified mood [affective] disorder: Secondary | ICD-10-CM | POA: Diagnosis not present

## 2020-10-22 DIAGNOSIS — N400 Enlarged prostate without lower urinary tract symptoms: Secondary | ICD-10-CM

## 2020-10-22 DIAGNOSIS — J439 Emphysema, unspecified: Secondary | ICD-10-CM

## 2020-10-22 DIAGNOSIS — I951 Orthostatic hypotension: Secondary | ICD-10-CM

## 2020-10-22 DIAGNOSIS — J9611 Chronic respiratory failure with hypoxia: Secondary | ICD-10-CM | POA: Diagnosis not present

## 2020-10-26 ENCOUNTER — Other Ambulatory Visit: Payer: Self-pay

## 2020-10-26 ENCOUNTER — Other Ambulatory Visit: Payer: Medicare Other | Admitting: Adult Health Nurse Practitioner

## 2020-10-26 DIAGNOSIS — J84112 Idiopathic pulmonary fibrosis: Secondary | ICD-10-CM

## 2020-10-26 DIAGNOSIS — Z515 Encounter for palliative care: Secondary | ICD-10-CM

## 2020-10-26 NOTE — Progress Notes (Signed)
[facility] Consult Note Telephone: [phone]  Fax: [phone]  PATIENT NAME: Wesley Newton DOB: 15-Jan-1927 MRN: [id number]  PRIMARY CARE PROVIDER:   Baxter Hire, MD  REFERRING PROVIDER:  Baxter Hire, MD [place],  [place] [address]  RESPONSIBLE PARTY:   Granddaughter [name] [phone]   RECOMMENDATIONS and PLAN: 1.Advanced care planning. Patient is a DNR. Will call granddaughter to update on visit.  2.  Functional status.  Patient has DOE.  Is able to perform ADLs independently but while at SNF is being assisted.  He is continent of B&B.  Denies falls.  Working with PT/OT at facility.  Continue therapy as ordered  3.  Nutritional status. Patient weight stable in the 150s. Eats well but still has to encourage himself to eat. This is baseline. Continue to monitor weight.  Palliative will continue to monitor for symptom management/decline and make recommendations as needed. Will continue to follow in ALF.  Encouraged to call with any questions or concerns.  I spent 30 minutes providing this consultation,  From 1:00 to 1:30 including time spent with patient/family, chart review, provider coordination, documentation. More than 50% of the time in this consultation was spent coordinating communication.   HISTORY OF PRESENT ILLNESS:  Wesley Newton is a 86 y.o. year old male with multiple medical problems including COPD, pulmonary emphysema, chronic cough, dyspnea on exertion, asthmatic bronchitis, diastolic heart failure, hypertension, hypercholesterolemia, gerd, rhinitis, anemia, BPH, lumbago. Palliative Care was asked to help address goals of care. Patient hospitalized 10/17-10/22/21 for acute on chronic respiratory failure and also found to have aspiration pneumonia.  Patient feeling better and states that he is using his oxygen at 2L and prior to going to hospital was having to bump  it up to 3 or 4L.  He was using his O2 at night and as needed and is now using it continuously.  He is currently at [facility] for short term rehab.  Plans on transitioning to ALF at [facility].  States that he is not comfortable going back living a lone without help.  We had been talking about transitioning to ALF for a while and this is where he will get the help he needs to conserve his energy.     CODE STATUS: DNR  PPS: 50% HOSPICE ELIGIBILITY/DIAGNOSIS: TBD  PHYSICAL EXAM:  HR 69  O2 96% on 2L General: NAD, frail appearing, thin Cardiovascular: regular rate and rhythm Pulmonary:lung sounds diminished with no abnormal breath sounds heard; normal respiratory effort Abdomen: soft, nontender, + bowel sounds GU: no suprapubic tenderness Extremities:traceedema,arthritic changes to fingers Skin: no rasheson exposed skin Neurological: Weakness but otherwise nonfocal  PAST MEDICAL HISTORY:  Past Medical History:  Diagnosis Date  . Asthma   . BPH (benign prostatic hyperplasia)   . Bronchitis   . Cancer ([place])   . COPD (chronic obstructive pulmonary disease) ([place])   . DVT of leg (deep venous thrombosis) ([place])   . Elevated PSA   . Emphysema lung ([place])   . Emphysema of lung ([place])   . Hyperlipidemia   . Nocturia   . Pulmonary fibrosis ([place])   . Pulmonary fibrosis ([place])   . Skin cancer     SOCIAL HX:  Social History   Tobacco Use  . Smoking status: Former Research scientist (life sciences)  . Smokeless tobacco: Never Used  . Tobacco comment: quit 40 years  Substance Use Topics  . Alcohol use: No    Alcohol/week:  0.0 standard drinks    ALLERGIES:  Allergies  Allergen Reactions  . Amoxicillin Other (See Comments)    DID THE REACTION INVOLVE: Swelling of the face/tongue/throat, SOB, or low BP? Unknown Sudden or severe rash/hives, skin peeling, or the inside of the mouth or nose? Unknown Did it require medical treatment? Unknown When did it last happen?Unknown If all above answers are "NO",  may proceed with cephalosporin use.   . Codeine Nausea And Vomiting  . Rapaflo [Silodosin] Other (See Comments)    Reaction:  Unknown   . Tramadol Nausea And Vomiting and Other (See Comments)    Pt states that this medication makes him feel crazy.    . Trimethoprim Other (See Comments)    Reaction: unknown     PERTINENT MEDICATIONS:  Outpatient Encounter Medications as of 10/26/2020  Medication Sig  . albuterol (PROVENTIL HFA;VENTOLIN HFA) 108 (90 Base) MCG/ACT inhaler Inhale 2 puffs into the lungs every 6 (six) hours as needed for wheezing or shortness of breath.  Marland Kitchen amiodarone (PACERONE) 200 MG tablet Take 1 tablet (200 mg total) by mouth 2 (two) times daily. (Patient taking differently: Take 100 mg by mouth daily. )  . apixaban (ELIQUIS) 2.5 MG TABS tablet Take 2 tablets (5 mg total) by mouth 2 (two) times daily.  Marland Kitchen atorvastatin (LIPITOR) 20 MG tablet Take 1 tablet (20 mg total) by mouth daily at 6 PM.  . azelastine (ASTELIN) 0.1 % nasal spray Place 1 spray into both nostrils daily.  . budesonide (PULMICORT) 0.5 MG/2ML nebulizer solution Take 0.5 mg by nebulization daily.  . finasteride (PROSCAR) 5 MG tablet Take 1 tablet (5 mg total) by mouth daily.  . fluticasone (FLONASE) 50 MCG/ACT nasal spray Place 2 sprays into both nostrils daily.  Marland Kitchen gabapentin (NEURONTIN) 100 MG capsule Take 100-200 mg by mouth 2 (two) times daily. Take 1 capsule (100mg ) by mouth every morning and 2 capsules (200mg ) by mouth every night.  Marland Kitchen ipratropium-albuterol (DUONEB) 0.5-2.5 (3) MG/3ML SOLN Inhale 3 mLs into the lungs 4 (four) times daily.  Marland Kitchen LORazepam (ATIVAN) 0.5 MG tablet Take 1 tablet (0.5 mg total) by mouth every 8 (eight) hours as needed for anxiety.  . midodrine (PROAMATINE) 10 MG tablet Take 1 tablet (10 mg total) by mouth 3 (three) times daily with meals.  . Multiple Vitamins-Minerals (OCUVITE ADULT 50+ PO) Take 1 capsule by mouth daily.  . naphazoline-glycerin (CLEAR EYES REDNESS) 0.012-0.2 % SOLN  Place 1-2 drops into both eyes 4 (four) times daily as needed for eye irritation.  . pantoprazole (PROTONIX) 40 MG tablet Take 1 tablet (40 mg total) by mouth 2 (two) times daily.  . predniSONE (DELTASONE) 10 MG tablet Take 10 mg by mouth daily.  . tamsulosin (FLOMAX) 0.4 MG CAPS capsule TAKE 2 CAPSULES BY MOUTH EVERY DAY (Patient taking differently: Take 0.4 mg by mouth daily after supper. )  . triamcinolone ointment (KENALOG) 0.1 % Apply 1 application topically as directed. Apply 2-3 times daily   No facility-administered encounter medications on file as of 10/26/2020.     Yulia Ulrich Jenetta Downer, NP

## 2020-10-27 ENCOUNTER — Telehealth: Payer: Self-pay | Admitting: Adult Health Nurse Practitioner

## 2020-10-27 NOTE — Telephone Encounter (Signed)
Called granddaughter to update on visit.  Left VM with reason for call and call back info Camyla Camposano K. Olena Heckle NP

## 2020-11-04 DIAGNOSIS — R600 Localized edema: Secondary | ICD-10-CM | POA: Diagnosis not present

## 2020-11-10 DIAGNOSIS — R609 Edema, unspecified: Secondary | ICD-10-CM

## 2020-11-19 DIAGNOSIS — I951 Orthostatic hypotension: Secondary | ICD-10-CM | POA: Diagnosis not present

## 2020-11-25 DIAGNOSIS — R059 Cough, unspecified: Secondary | ICD-10-CM

## 2020-11-25 DIAGNOSIS — J069 Acute upper respiratory infection, unspecified: Secondary | ICD-10-CM | POA: Diagnosis not present

## 2020-11-25 DIAGNOSIS — J449 Chronic obstructive pulmonary disease, unspecified: Secondary | ICD-10-CM

## 2020-11-25 DIAGNOSIS — R0602 Shortness of breath: Secondary | ICD-10-CM

## 2020-11-26 ENCOUNTER — Emergency Department: Payer: Medicare Other

## 2020-11-26 ENCOUNTER — Other Ambulatory Visit: Payer: Self-pay

## 2020-11-26 ENCOUNTER — Inpatient Hospital Stay
Admission: EM | Admit: 2020-11-26 | Discharge: 2020-12-19 | DRG: 871 | Disposition: E | Payer: Medicare Other | Source: Skilled Nursing Facility | Attending: Internal Medicine | Admitting: Internal Medicine

## 2020-11-26 DIAGNOSIS — Z66 Do not resuscitate: Secondary | ICD-10-CM | POA: Diagnosis present

## 2020-11-26 DIAGNOSIS — I493 Ventricular premature depolarization: Secondary | ICD-10-CM | POA: Diagnosis present

## 2020-11-26 DIAGNOSIS — Z8249 Family history of ischemic heart disease and other diseases of the circulatory system: Secondary | ICD-10-CM

## 2020-11-26 DIAGNOSIS — J9621 Acute and chronic respiratory failure with hypoxia: Secondary | ICD-10-CM | POA: Diagnosis present

## 2020-11-26 DIAGNOSIS — E872 Acidosis: Secondary | ICD-10-CM | POA: Diagnosis present

## 2020-11-26 DIAGNOSIS — R972 Elevated prostate specific antigen [PSA]: Secondary | ICD-10-CM | POA: Diagnosis present

## 2020-11-26 DIAGNOSIS — N179 Acute kidney failure, unspecified: Secondary | ICD-10-CM | POA: Diagnosis not present

## 2020-11-26 DIAGNOSIS — R6520 Severe sepsis without septic shock: Secondary | ICD-10-CM | POA: Diagnosis present

## 2020-11-26 DIAGNOSIS — Z79899 Other long term (current) drug therapy: Secondary | ICD-10-CM | POA: Diagnosis not present

## 2020-11-26 DIAGNOSIS — Z9981 Dependence on supplemental oxygen: Secondary | ICD-10-CM

## 2020-11-26 DIAGNOSIS — N1831 Chronic kidney disease, stage 3a: Secondary | ICD-10-CM | POA: Diagnosis present

## 2020-11-26 DIAGNOSIS — N400 Enlarged prostate without lower urinary tract symptoms: Secondary | ICD-10-CM | POA: Diagnosis present

## 2020-11-26 DIAGNOSIS — I248 Other forms of acute ischemic heart disease: Secondary | ICD-10-CM

## 2020-11-26 DIAGNOSIS — Z515 Encounter for palliative care: Secondary | ICD-10-CM | POA: Diagnosis not present

## 2020-11-26 DIAGNOSIS — Z7901 Long term (current) use of anticoagulants: Secondary | ICD-10-CM | POA: Diagnosis not present

## 2020-11-26 DIAGNOSIS — Z881 Allergy status to other antibiotic agents status: Secondary | ICD-10-CM

## 2020-11-26 DIAGNOSIS — Z885 Allergy status to narcotic agent status: Secondary | ICD-10-CM

## 2020-11-26 DIAGNOSIS — Z7189 Other specified counseling: Secondary | ICD-10-CM | POA: Diagnosis not present

## 2020-11-26 DIAGNOSIS — I130 Hypertensive heart and chronic kidney disease with heart failure and stage 1 through stage 4 chronic kidney disease, or unspecified chronic kidney disease: Secondary | ICD-10-CM | POA: Diagnosis present

## 2020-11-26 DIAGNOSIS — I482 Chronic atrial fibrillation, unspecified: Secondary | ICD-10-CM

## 2020-11-26 DIAGNOSIS — Z20822 Contact with and (suspected) exposure to covid-19: Secondary | ICD-10-CM | POA: Diagnosis present

## 2020-11-26 DIAGNOSIS — J9601 Acute respiratory failure with hypoxia: Secondary | ICD-10-CM | POA: Diagnosis present

## 2020-11-26 DIAGNOSIS — I959 Hypotension, unspecified: Secondary | ICD-10-CM

## 2020-11-26 DIAGNOSIS — I5033 Acute on chronic diastolic (congestive) heart failure: Secondary | ICD-10-CM | POA: Diagnosis present

## 2020-11-26 DIAGNOSIS — J440 Chronic obstructive pulmonary disease with acute lower respiratory infection: Secondary | ICD-10-CM | POA: Diagnosis present

## 2020-11-26 DIAGNOSIS — Z888 Allergy status to other drugs, medicaments and biological substances status: Secondary | ICD-10-CM

## 2020-11-26 DIAGNOSIS — Z7951 Long term (current) use of inhaled steroids: Secondary | ICD-10-CM

## 2020-11-26 DIAGNOSIS — Z86718 Personal history of other venous thrombosis and embolism: Secondary | ICD-10-CM | POA: Diagnosis not present

## 2020-11-26 DIAGNOSIS — L89306 Pressure-induced deep tissue damage of unspecified buttock: Secondary | ICD-10-CM | POA: Diagnosis present

## 2020-11-26 DIAGNOSIS — Z8674 Personal history of sudden cardiac arrest: Secondary | ICD-10-CM

## 2020-11-26 DIAGNOSIS — I480 Paroxysmal atrial fibrillation: Secondary | ICD-10-CM | POA: Diagnosis present

## 2020-11-26 DIAGNOSIS — I214 Non-ST elevation (NSTEMI) myocardial infarction: Secondary | ICD-10-CM | POA: Diagnosis present

## 2020-11-26 DIAGNOSIS — R54 Age-related physical debility: Secondary | ICD-10-CM | POA: Diagnosis present

## 2020-11-26 DIAGNOSIS — E785 Hyperlipidemia, unspecified: Secondary | ICD-10-CM | POA: Diagnosis present

## 2020-11-26 DIAGNOSIS — J189 Pneumonia, unspecified organism: Secondary | ICD-10-CM | POA: Diagnosis present

## 2020-11-26 DIAGNOSIS — Z85828 Personal history of other malignant neoplasm of skin: Secondary | ICD-10-CM | POA: Diagnosis not present

## 2020-11-26 DIAGNOSIS — I5031 Acute diastolic (congestive) heart failure: Secondary | ICD-10-CM

## 2020-11-26 DIAGNOSIS — H5789 Other specified disorders of eye and adnexa: Secondary | ICD-10-CM | POA: Diagnosis present

## 2020-11-26 DIAGNOSIS — Z87891 Personal history of nicotine dependence: Secondary | ICD-10-CM

## 2020-11-26 DIAGNOSIS — I9589 Other hypotension: Secondary | ICD-10-CM | POA: Diagnosis present

## 2020-11-26 DIAGNOSIS — J84112 Idiopathic pulmonary fibrosis: Secondary | ICD-10-CM | POA: Diagnosis present

## 2020-11-26 DIAGNOSIS — A419 Sepsis, unspecified organism: Principal | ICD-10-CM | POA: Diagnosis present

## 2020-11-26 LAB — CBC WITH DIFFERENTIAL/PLATELET
Abs Immature Granulocytes: 0.12 10*3/uL — ABNORMAL HIGH (ref 0.00–0.07)
Basophils Absolute: 0.1 10*3/uL (ref 0.0–0.1)
Basophils Relative: 0 %
Eosinophils Absolute: 0.1 10*3/uL (ref 0.0–0.5)
Eosinophils Relative: 0 %
HCT: 36.7 % — ABNORMAL LOW (ref 39.0–52.0)
Hemoglobin: 11.4 g/dL — ABNORMAL LOW (ref 13.0–17.0)
Immature Granulocytes: 1 %
Lymphocytes Relative: 7 %
Lymphs Abs: 1.2 10*3/uL (ref 0.7–4.0)
MCH: 28.3 pg (ref 26.0–34.0)
MCHC: 31.1 g/dL (ref 30.0–36.0)
MCV: 91.1 fL (ref 80.0–100.0)
Monocytes Absolute: 1.4 10*3/uL — ABNORMAL HIGH (ref 0.1–1.0)
Monocytes Relative: 8 %
Neutro Abs: 13.8 10*3/uL — ABNORMAL HIGH (ref 1.7–7.7)
Neutrophils Relative %: 84 %
Platelets: 290 10*3/uL (ref 150–400)
RBC: 4.03 MIL/uL — ABNORMAL LOW (ref 4.22–5.81)
RDW: 16.7 % — ABNORMAL HIGH (ref 11.5–15.5)
WBC: 16.6 10*3/uL — ABNORMAL HIGH (ref 4.0–10.5)
nRBC: 0 % (ref 0.0–0.2)

## 2020-11-26 LAB — BLOOD GAS, VENOUS
Acid-Base Excess: 1.4 mmol/L (ref 0.0–2.0)
Bicarbonate: 27.2 mmol/L (ref 20.0–28.0)
O2 Saturation: 65.2 %
Patient temperature: 37
pCO2, Ven: 47 mmHg (ref 44.0–60.0)
pH, Ven: 7.37 (ref 7.250–7.430)
pO2, Ven: 35 mmHg (ref 32.0–45.0)

## 2020-11-26 LAB — RESP PANEL BY RT-PCR (FLU A&B, COVID) ARPGX2
Influenza A by PCR: NEGATIVE
Influenza B by PCR: NEGATIVE
SARS Coronavirus 2 by RT PCR: NEGATIVE

## 2020-11-26 LAB — TROPONIN I (HIGH SENSITIVITY)
Troponin I (High Sensitivity): 330 ng/L (ref <18)
Troponin I (High Sensitivity): 8296 ng/L (ref <18)

## 2020-11-26 LAB — COMPREHENSIVE METABOLIC PANEL
ALT: 19 U/L (ref 0–44)
AST: 27 U/L (ref 15–41)
Albumin: 2.8 g/dL — ABNORMAL LOW (ref 3.5–5.0)
Alkaline Phosphatase: 53 U/L (ref 38–126)
Anion gap: 11 (ref 5–15)
BUN: 14 mg/dL (ref 8–23)
CO2: 27 mmol/L (ref 22–32)
Calcium: 8.7 mg/dL — ABNORMAL LOW (ref 8.9–10.3)
Chloride: 102 mmol/L (ref 98–111)
Creatinine, Ser: 1.3 mg/dL — ABNORMAL HIGH (ref 0.61–1.24)
GFR, Estimated: 52 mL/min — ABNORMAL LOW (ref >60)
Glucose, Bld: 124 mg/dL — ABNORMAL HIGH (ref 70–99)
Potassium: 4 mmol/L (ref 3.5–5.1)
Sodium: 140 mmol/L (ref 135–145)
Total Bilirubin: 1.2 mg/dL (ref 0.3–1.2)
Total Protein: 6.5 g/dL (ref 6.5–8.1)

## 2020-11-26 LAB — HEPARIN LEVEL (UNFRACTIONATED)
Heparin Unfractionated: 3.6 IU/mL — ABNORMAL HIGH (ref 0.30–0.70)
Heparin Unfractionated: 3.32 IU/mL — ABNORMAL HIGH (ref 0.30–0.70)

## 2020-11-26 LAB — APTT
aPTT: 36 seconds (ref 24–36)
aPTT: 64 seconds — ABNORMAL HIGH (ref 24–36)

## 2020-11-26 LAB — GLUCOSE, CAPILLARY: Glucose-Capillary: 215 mg/dL — ABNORMAL HIGH (ref 70–99)

## 2020-11-26 LAB — BRAIN NATRIURETIC PEPTIDE: B Natriuretic Peptide: 238.3 pg/mL — ABNORMAL HIGH (ref 0.0–100.0)

## 2020-11-26 LAB — LACTIC ACID, PLASMA
Lactic Acid, Venous: 3.6 mmol/L (ref 0.5–1.9)
Lactic Acid, Venous: 2.8 mmol/L (ref 0.5–1.9)

## 2020-11-26 LAB — PROCALCITONIN: Procalcitonin: 0.11 ng/mL

## 2020-11-26 LAB — MRSA PCR SCREENING: MRSA by PCR: NEGATIVE

## 2020-11-26 MED ORDER — IPRATROPIUM-ALBUTEROL 0.5-2.5 (3) MG/3ML IN SOLN
3.0000 mL | Freq: Once | RESPIRATORY_TRACT | Status: AC
  Administered 2020-11-26: 3 mL via RESPIRATORY_TRACT

## 2020-11-26 MED ORDER — AMIODARONE HCL 200 MG PO TABS
200.0000 mg | ORAL_TABLET | Freq: Every day | ORAL | Status: DC
  Administered 2020-11-26 – 2020-11-27 (×2): 200 mg via ORAL
  Filled 2020-11-26 (×2): qty 1

## 2020-11-26 MED ORDER — AZELASTINE HCL 0.1 % NA SOLN
1.0000 | Freq: Every day | NASAL | Status: DC
  Administered 2020-11-26 – 2020-11-27 (×2): 1 via NASAL
  Filled 2020-11-26: qty 30

## 2020-11-26 MED ORDER — PANTOPRAZOLE SODIUM 40 MG PO TBEC
40.0000 mg | DELAYED_RELEASE_TABLET | Freq: Two times a day (BID) | ORAL | Status: DC
  Administered 2020-11-26 – 2020-11-27 (×4): 40 mg via ORAL
  Filled 2020-11-26 (×4): qty 1

## 2020-11-26 MED ORDER — FUROSEMIDE 10 MG/ML IJ SOLN
40.0000 mg | Freq: Two times a day (BID) | INTRAMUSCULAR | Status: DC
  Administered 2020-11-26 (×2): 40 mg via INTRAVENOUS
  Filled 2020-11-26 (×3): qty 4

## 2020-11-26 MED ORDER — OCUVITE-LUTEIN PO CAPS
ORAL_CAPSULE | Freq: Every day | ORAL | Status: DC
  Administered 2020-11-26 – 2020-11-27 (×2): 1 via ORAL
  Filled 2020-11-26 (×4): qty 1

## 2020-11-26 MED ORDER — VANCOMYCIN HCL IN DEXTROSE 1-5 GM/200ML-% IV SOLN
1000.0000 mg | Freq: Once | INTRAVENOUS | Status: AC
  Administered 2020-11-26: 1000 mg via INTRAVENOUS
  Filled 2020-11-26: qty 200

## 2020-11-26 MED ORDER — ACETAMINOPHEN 500 MG PO TABS
1000.0000 mg | ORAL_TABLET | Freq: Once | ORAL | Status: AC
  Administered 2020-11-26: 1000 mg via ORAL
  Filled 2020-11-26: qty 2

## 2020-11-26 MED ORDER — SALINE SPRAY 0.65 % NA SOLN
2.0000 | NASAL | Status: DC | PRN
  Filled 2020-11-26: qty 44

## 2020-11-26 MED ORDER — MIDODRINE HCL 5 MG PO TABS
10.0000 mg | ORAL_TABLET | Freq: Three times a day (TID) | ORAL | Status: DC
  Administered 2020-11-26 – 2020-11-27 (×4): 10 mg via ORAL
  Filled 2020-11-26 (×6): qty 2

## 2020-11-26 MED ORDER — ASPIRIN EC 81 MG PO TBEC
81.0000 mg | DELAYED_RELEASE_TABLET | Freq: Every day | ORAL | Status: DC
  Administered 2020-11-26 – 2020-11-27 (×2): 81 mg via ORAL
  Filled 2020-11-26 (×2): qty 1

## 2020-11-26 MED ORDER — ONDANSETRON HCL 4 MG/2ML IJ SOLN: 4.0000 mg | Freq: Four times a day (QID) | INTRAMUSCULAR | Status: DC | PRN

## 2020-11-26 MED ORDER — FINASTERIDE 5 MG PO TABS
5.0000 mg | ORAL_TABLET | Freq: Every day | ORAL | Status: DC
  Administered 2020-11-26 – 2020-11-27 (×2): 5 mg via ORAL
  Filled 2020-11-26 (×2): qty 1

## 2020-11-26 MED ORDER — GABAPENTIN 100 MG PO CAPS
200.0000 mg | ORAL_CAPSULE | Freq: Every day | ORAL | Status: DC
  Administered 2020-11-26 – 2020-11-27 (×2): 200 mg via ORAL
  Filled 2020-11-26 (×2): qty 2

## 2020-11-26 MED ORDER — ONDANSETRON HCL 4 MG/2ML IJ SOLN
4.0000 mg | Freq: Once | INTRAMUSCULAR | Status: AC
  Administered 2020-11-26: 4 mg via INTRAVENOUS
  Filled 2020-11-26: qty 2

## 2020-11-26 MED ORDER — GABAPENTIN 100 MG PO CAPS
100.0000 mg | ORAL_CAPSULE | Freq: Two times a day (BID) | ORAL | Status: DC
  Administered 2020-11-26: 100 mg via ORAL
  Filled 2020-11-26: qty 1

## 2020-11-26 MED ORDER — METHYLPREDNISOLONE SODIUM SUCC 125 MG IJ SOLR: 125.0000 mg | Freq: Once | INTRAMUSCULAR | Status: AC

## 2020-11-26 MED ORDER — TRIAMCINOLONE ACETONIDE 0.1 % EX CREA
1.0000 "application " | TOPICAL_CREAM | Freq: Three times a day (TID) | CUTANEOUS | Status: DC | PRN
  Filled 2020-11-26: qty 15

## 2020-11-26 MED ORDER — SODIUM CHLORIDE 0.9 % IV SOLN
1.0000 g | Freq: Once | INTRAVENOUS | Status: AC
  Administered 2020-11-26: 1 g via INTRAVENOUS
  Filled 2020-11-26: qty 1

## 2020-11-26 MED ORDER — BOOST / RESOURCE BREEZE PO LIQD CUSTOM
237.0000 mL | Freq: Every day | ORAL | Status: DC
  Administered 2020-11-27: 1 via ORAL

## 2020-11-26 MED ORDER — FLUTICASONE PROPIONATE 50 MCG/ACT NA SUSP
2.0000 | Freq: Every day | NASAL | Status: DC
  Administered 2020-11-26 – 2020-11-27 (×2): 2 via NASAL
  Filled 2020-11-26: qty 16

## 2020-11-26 MED ORDER — METHYLPREDNISOLONE SODIUM SUCC 40 MG IJ SOLR
40.0000 mg | Freq: Every day | INTRAMUSCULAR | Status: DC
  Administered 2020-11-27: 40 mg via INTRAVENOUS
  Filled 2020-11-26: qty 1

## 2020-11-26 MED ORDER — FUROSEMIDE 10 MG/ML IJ SOLN
20.0000 mg | Freq: Once | INTRAMUSCULAR | Status: AC
  Administered 2020-11-26: 20 mg via INTRAVENOUS

## 2020-11-26 MED ORDER — ONDANSETRON HCL 4 MG PO TABS: 4.0000 mg | ORAL_TABLET | Freq: Four times a day (QID) | ORAL | Status: DC | PRN

## 2020-11-26 MED ORDER — NAPHAZOLINE-GLYCERIN 0.012-0.2 % OP SOLN
1.0000 [drp] | Freq: Four times a day (QID) | OPHTHALMIC | Status: DC | PRN
  Filled 2020-11-26: qty 15

## 2020-11-26 MED ORDER — GABAPENTIN 100 MG PO CAPS
100.0000 mg | ORAL_CAPSULE | Freq: Every day | ORAL | Status: DC
  Administered 2020-11-27: 100 mg via ORAL
  Filled 2020-11-26: qty 1

## 2020-11-26 MED ORDER — TAMSULOSIN HCL 0.4 MG PO CAPS
0.4000 mg | ORAL_CAPSULE | Freq: Every day | ORAL | Status: DC
  Administered 2020-11-26 – 2020-11-27 (×2): 0.4 mg via ORAL
  Filled 2020-11-26 (×2): qty 1

## 2020-11-26 MED ORDER — DOXYCYCLINE HYCLATE 100 MG PO TABS
100.0000 mg | ORAL_TABLET | Freq: Two times a day (BID) | ORAL | Status: DC
  Administered 2020-11-26 – 2020-11-27 (×4): 100 mg via ORAL
  Filled 2020-11-26 (×4): qty 1

## 2020-11-26 MED ORDER — POLYVINYL ALCOHOL 1.4 % OP SOLN
1.0000 [drp] | Freq: Four times a day (QID) | OPHTHALMIC | Status: DC | PRN
  Filled 2020-11-26: qty 15

## 2020-11-26 MED ORDER — BOOST PO LIQD
237.0000 mL | ORAL | Status: DC
  Administered 2020-11-27: 237 mL via ORAL
  Filled 2020-11-26: qty 237

## 2020-11-26 MED ORDER — ACETAMINOPHEN 325 MG PO TABS
650.0000 mg | ORAL_TABLET | Freq: Four times a day (QID) | ORAL | Status: DC | PRN
  Administered 2020-11-27: 650 mg via ORAL
  Filled 2020-11-26: qty 2

## 2020-11-26 MED ORDER — ATORVASTATIN CALCIUM 20 MG PO TABS
20.0000 mg | ORAL_TABLET | Freq: Every day | ORAL | Status: DC
  Administered 2020-11-26 – 2020-11-27 (×2): 20 mg via ORAL
  Filled 2020-11-26 (×2): qty 1

## 2020-11-26 MED ORDER — FUROSEMIDE 10 MG/ML IJ SOLN
INTRAMUSCULAR | Status: AC
  Filled 2020-11-26: qty 4

## 2020-11-26 MED ORDER — HEPARIN BOLUS VIA INFUSION
1000.0000 [IU] | Freq: Once | INTRAVENOUS | Status: AC
  Administered 2020-11-26: 1000 [IU] via INTRAVENOUS
  Filled 2020-11-26: qty 1000

## 2020-11-26 MED ORDER — BUDESONIDE 0.5 MG/2ML IN SUSP
0.5000 mg | Freq: Every day | RESPIRATORY_TRACT | Status: DC
  Administered 2020-11-26 – 2020-11-27 (×2): 0.5 mg via RESPIRATORY_TRACT
  Filled 2020-11-26 (×2): qty 2

## 2020-11-26 MED ORDER — SODIUM CHLORIDE 0.9 % IV SOLN
2.0000 g | INTRAVENOUS | Status: DC
  Administered 2020-11-26 – 2020-11-27 (×2): 2 g via INTRAVENOUS
  Filled 2020-11-26: qty 2
  Filled 2020-11-26: qty 20
  Filled 2020-11-26: qty 2

## 2020-11-26 MED ORDER — CHLORHEXIDINE GLUCONATE CLOTH 2 % EX PADS
6.0000 | MEDICATED_PAD | Freq: Every day | CUTANEOUS | Status: DC
  Administered 2020-11-26 – 2020-11-27 (×2): 6 via TOPICAL

## 2020-11-26 MED ORDER — IPRATROPIUM-ALBUTEROL 0.5-2.5 (3) MG/3ML IN SOLN
3.0000 mL | Freq: Four times a day (QID) | RESPIRATORY_TRACT | Status: DC
  Administered 2020-11-26 – 2020-11-28 (×8): 3 mL via RESPIRATORY_TRACT
  Filled 2020-11-26 (×8): qty 3

## 2020-11-26 MED ORDER — HEPARIN (PORCINE) 25000 UT/250ML-% IV SOLN
1150.0000 [IU]/h | INTRAVENOUS | Status: DC
  Administered 2020-11-26: 11:00:00 800 [IU]/h via INTRAVENOUS
  Filled 2020-11-26 (×2): qty 250

## 2020-11-26 MED ORDER — ACETAMINOPHEN 650 MG RE SUPP: 650.0000 mg | Freq: Four times a day (QID) | RECTAL | Status: DC | PRN

## 2020-11-26 MED ORDER — METHYLPREDNISOLONE SODIUM SUCC 125 MG IJ SOLR
INTRAMUSCULAR | Status: AC
  Administered 2020-11-26: 125 mg via INTRAVENOUS
  Filled 2020-11-26: qty 2

## 2020-11-26 MED ORDER — LORAZEPAM 0.5 MG PO TABS
0.5000 mg | ORAL_TABLET | Freq: Three times a day (TID) | ORAL | Status: DC | PRN
  Administered 2020-11-27: 0.5 mg via ORAL
  Filled 2020-11-26: qty 1

## 2020-11-26 NOTE — H&P (Signed)
[facility] [place] NAME: Wesley Newton    MR#:  [id number]  DATE OF BIRTH:  11/04/1927  DATE OF ADMISSION:  12/08/2020  PRIMARY CARE PHYSICIAN: Baxter Hire, MD   REQUESTING/REFERRING PHYSICIAN: Dr Arta Silence  CHIEF COMPLAINT:   Chief Complaint  Patient presents with  . Respiratory Distress    HISTORY OF PRESENT ILLNESS:  Wesley Newton  is a 86 y.o. male with a known history of COPD with pulmonary fibrosis on chronic 2 L of oxygen coming in with shortness of breath over the past 3 or 4 days.  He states he does cough up a little bit of blood and no wheezing.  He has gained 20 pounds in the last 2 weeks and has lower extremity swelling.  No complaints of chest pain.  Second troponin just came back greater than 8000.  No fever chills or sweats.  ER physician gave 20 mg of Lasix, 125 mg of Solu-Medrol and ordered antibiotic.  Hospitalist services was contacted for further evaluation.  PAST MEDICAL HISTORY:   Past Medical History:  Diagnosis Date  . Asthma   . BPH (benign prostatic hyperplasia)   . Bronchitis   . Cancer ([place])   . COPD (chronic obstructive pulmonary disease) ([place])   . DVT of leg (deep venous thrombosis) ([place])   . Elevated PSA   . Emphysema lung ([place])   . Emphysema of lung ([place])   . Hyperlipidemia   . Nocturia   . Pulmonary fibrosis ([place])   . Pulmonary fibrosis ([place])   . Skin cancer     PAST SURGICAL HISTORY:   Past Surgical History:  Procedure Laterality Date  . CATARACT EXTRACTION    . HERNIA REPAIR    . TONSILLECTOMY      SOCIAL HISTORY:   Social History   Tobacco Use  . Smoking status: Former Research scientist (life sciences)  . Smokeless tobacco: Never Used  . Tobacco comment: quit 40 years  Substance Use Topics  . Alcohol use: No    Alcohol/week: 0.0 standard drinks    FAMILY HISTORY:   Family History  Problem Relation Age of Onset  . CAD Mother   . CAD Father   . Heart Problems Brother   . Kidney  disease Neg Hx   . Prostate cancer Neg Hx     DRUG ALLERGIES:   Allergies  Allergen Reactions  . Amoxicillin Other (See Comments)    DID THE REACTION INVOLVE: Swelling of the face/tongue/throat, SOB, or low BP? Unknown Sudden or severe rash/hives, skin peeling, or the inside of the mouth or nose? Unknown Did it require medical treatment? Unknown When did it last happen?Unknown If all above answers are "NO", may proceed with cephalosporin use.   . Codeine Nausea And Vomiting  . Rapaflo [Silodosin] Other (See Comments)    Reaction:  Unknown   . Tramadol Nausea And Vomiting and Other (See Comments)    Pt states that this medication makes him feel crazy.    . Trimethoprim Other (See Comments)    Reaction: unknown    REVIEW OF SYSTEMS:  CONSTITUTIONAL: No fever, chills or sweats.  Positive for fatigue.  EYES: Does have blurred vision and wears glasses and has burning on his eyes. EARS, NOSE, AND THROAT: Positive for sore throat and runny nose and decreased hearing RESPIRATORY: Positive for cough, shortness of breath, and slight hemoptysis.  CARDIOVASCULAR: No chest pain.  Positive for edema.  GASTROINTESTINAL: Positive for nausea.no vomiting, diarrhea or abdominal  pain. No blood in bowel movements GENITOURINARY: No dysuria, hematuria.  ENDOCRINE: No polyuria, nocturia,  HEMATOLOGY: No anemia, easy bruising or bleeding SKIN: No rash or lesion. MUSCULOSKELETAL: Positive for joint pain and muscle pain.   NEUROLOGIC: No tingling, numbness, weakness.  PSYCHIATRY: No anxiety or depression.   MEDICATIONS AT HOME:   Prior to Admission medications   Medication Sig Start Date End Date Taking? Authorizing Provider  acetaminophen (TYLENOL) 325 MG tablet Take 650 mg by mouth every 6 (six) hours as needed for mild pain.   Yes [provider]  albuterol (PROVENTIL HFA;VENTOLIN HFA) 108 (90 Base) MCG/ACT inhaler Inhale 2 puffs into the lungs every 6 (six) hours as needed for wheezing  or shortness of breath.   Yes [provider]  amiodarone (PACERONE) 200 MG tablet Take 1 tablet (200 mg total) by mouth 2 (two) times daily. Patient taking differently: Take 200 mg by mouth daily. 05/07/19  Yes Dustin Flock, MD  apixaban (ELIQUIS) 5 MG TABS tablet Take 5 mg by mouth 2 (two) times daily.   Yes [provider]  atorvastatin (LIPITOR) 20 MG tablet Take 1 tablet (20 mg total) by mouth daily at 6 PM. 05/07/19  Yes Dustin Flock, MD  azelastine (ASTELIN) 0.1 % nasal spray Place 1 spray into both nostrils daily. 01/10/19  Yes [provider]  azithromycin (ZITHROMAX) 250 MG tablet Take 250 mg by mouth. Take as directed dose pack: 2 tablet for 1 day, then 1 tablet daily 11/25/20 11/29/20 Yes [provider]  budesonide (PULMICORT) 0.5 MG/2ML nebulizer solution Take 0.5 mg by nebulization daily.   Yes [provider]  carboxymethylcellulose 1 % ophthalmic solution Place 1 drop into both eyes every 6 (six) hours as needed (dry eyes).   Yes [provider]  finasteride (PROSCAR) 5 MG tablet Take 1 tablet (5 mg total) by mouth daily. 12/24/19  Yes McGowan, Larene [place] A, PA-C  fludrocortisone (FLORINEF) 0.1 MG tablet Take 0.05 mg by mouth daily.   Yes [provider]  fluticasone (FLONASE) 50 MCG/ACT nasal spray Place 2 sprays into both nostrils daily. 09/23/20  Yes [provider]  gabapentin (NEURONTIN) 100 MG capsule Take 100-200 mg by mouth 2 (two) times daily. Take 1 capsule (100mg ) by mouth every morning and 2 capsules (200mg ) by mouth every night. 02/04/19  Yes [provider]  ipratropium-albuterol (DUONEB) 0.5-2.5 (3) MG/3ML SOLN Inhale 3 mLs into the lungs every 6 (six) hours as needed (shortness of breath). 01/14/19  Yes [provider]  lactose free nutrition (BOOST) LIQD Take 237 mLs by mouth daily.   Yes [provider]  LORazepam (ATIVAN) 0.5 MG tablet Take 1 tablet (0.5 mg total) by mouth  every 8 (eight) hours as needed for anxiety. 10/09/20  Yes Sharen Hones, MD  midodrine (PROAMATINE) 10 MG tablet Take 1 tablet (10 mg total) by mouth 3 (three) times daily with meals. 05/07/19  Yes Dustin Flock, MD  Multiple Vitamins-Minerals (OCUVITE ADULT 50+ PO) Take 1 capsule by mouth daily.   Yes [provider]  naphazoline-glycerin (CLEAR EYES REDNESS) 0.012-0.2 % SOLN Place 1-2 drops into both eyes 4 (four) times daily as needed for eye irritation. Patient taking differently: Place 1 drop into both eyes every 6 (six) hours as needed for eye irritation (dry eyes). 12/21/18  Yes Henreitta Leber, MD  Nutritional Supplements (ENSURE CLEAR) LIQD Take 237 mLs by mouth daily. Use when boost breeze is unavailable   Yes [provider]  pantoprazole (  PROTONIX) 40 MG tablet Take 1 tablet (40 mg total) by mouth 2 (two) times daily. 05/07/19  Yes Dustin Flock, MD  predniSONE (DELTASONE) 10 MG tablet Take 10 mg by mouth daily. 09/14/20  Yes [provider]  sodium chloride (OCEAN) 0.65 % SOLN nasal spray Place 2 sprays into both nostrils every 2 (two) hours as needed for congestion.   Yes [provider]  tamsulosin (FLOMAX) 0.4 MG CAPS capsule TAKE 2 CAPSULES BY MOUTH EVERY DAY 05/13/20  Yes Vaillancourt, Samantha, PA-C  triamcinolone (KENALOG) 0.1 % Apply 1 application topically every 8 (eight) hours as needed (itching). Apply 2-3 times daily 09/08/20  Yes [provider]      VITAL SIGNS:  Blood pressure (!) 99/50, pulse 90, temperature (!) 101 F (38.3 C), resp. rate (!) 29, height 5\' 6"  (1.676 m), weight 70 kg, SpO2 95 %.  PHYSICAL EXAMINATION:  GENERAL:  86 y.o.-year-old patient lying in the bed with slight respiratory distress on BiPAP EYES: Pupils equal, round, reactive to light and accommodation. No scleral icterus. Extraocular muscles intact.  HEENT: Head atraumatic, normocephalic. Oropharynx and nasopharynx clear.  NECK:  Supple, positive for  jugular venous distention.  LUNGS: Decreased breath sounds bilaterally, positive rales at the base.  Slight use of accessory muscles of respiration.  CARDIOVASCULAR: S1, S2 irregularly irregular. No murmurs, rubs, or gallops.  ABDOMEN: Soft, nontender, nondistended. Bowel sounds present.  EXTREMITIES: 3+ lower extremity edema.  NEUROLOGIC: Cranial nerves II through XII are intact. Muscle strength 5/5 in all extremities. Sensation intact. Gait not checked.  PSYCHIATRIC: The patient is alert and oriented x 3.  SKIN: No rash, lesion, or ulcer.   LABORATORY PANEL:   CBC Recent Labs  Lab 12/09/2020 0629  WBC 16.6*  HGB 11.4*  HCT 36.7*  PLT 290   ------------------------------------------------------------------------------------------------------------------  Chemistries  Recent Labs  Lab 12/03/2020 0629  NA 140  K 4.0  CL 102  CO2 27  GLUCOSE 124*  BUN 14  CREATININE 1.30*  CALCIUM 8.7*  AST 27  ALT 19  ALKPHOS 53  BILITOT 1.2   ------------------------------------------------------------------------------------------------------------------  Cardiac Enzymes Initial troponin  330.  Second troponin 8296.  RADIOLOGY:  DG Chest Portable 1 View  Result Date: 12/07/2020 CLINICAL DATA:  Shortness of breath EXAM: PORTABLE CHEST 1 VIEW COMPARISON:  10/04/2020 FINDINGS: Chronic lung disease with honeycombing and traction bronchiectasis by CT in 2020. Worsening opacity which is symmetric. No visible effusion or pneumothorax. Normal heart size. IMPRESSION: Increased pulmonary opacity since October 2021 with symmetry favoring edema, atypical infection, or alveolitis. Pulmonary fibrosis. Electronically Signed   By: Monte Fantasia M.D.   On: 11/24/2020 06:54    EKG:   Interpreted by me atrial fibrillation 88 bpm nonspecific ST-T wave changes  IMPRESSION AND PLAN:   1.  Acute diastolic congestive heart failure.  We will give Lasix 40 mg IV twice daily to remove fluid.  Blood  pressure too low for beta-blocker at this time. 2.  NSTEMI.  Switch Eliquis over to heparin drip.  Cardiology consultation.  Give aspirin.  Blood pressure too low for beta-blocker. 3.  Acute on chronic hypoxic respiratory failure.  Venous ABG did show a pH of 7.37.  He chronically wears 2 L of oxygen.  He was using accessory muscles to breathe.  Initial respiratory rate 41 breaths/min. 4.  Severe sepsis, present on admission with possible pneumonia, leukocytosis and fever of 101.  Patient has hypotension, CHF and NSTEMI and worsening respiratory failure requiring BiPAP.  Admitting  physician ordered empiric antibiotics I will give doxycycline and Rocephin for now.  Follow-up blood cultures.  Patient also had lactic acidosis. 5.  Chronic hypotension on midodrine to lift up blood pressure.  DC Florinef because this can cause fluid retention. 6.  BPH on Proscar and Flomax.  Will decrease the dose of the Flomax. 7.  Chronic kidney disease stage IIIa.  Monitor with diuresis. 8.  Patient is a DO NOT RESUSCITATE 9.  Chronic atrial fibrillation.  Switch Eliquis over to heparin drip.  Rate controlled with amiodarone.   All the records are reviewed and case discussed with ED provider. Management plans discussed with the patient, family and they are in agreement.  CODE STATUS: DNR  TOTAL TIME TAKING CARE OF THIS PATIENT: 50 minutes.    Loletha Grayer M.D on 12/01/2020 at 9:28 AM  Between 7am to 6pm - Pager - [phone]  After 6pm call admission pager [phone]  [facility]  CC: Primary care physician; Baxter Hire, MD

## 2020-11-26 NOTE — ED Notes (Signed)
Dr Alfred Levins notified in person of pts LA 3.6. no new orders at this time.

## 2020-11-26 NOTE — Progress Notes (Signed)
ANTICOAGULATION CONSULT NOTE - Initial Consult  Pharmacy Consult for heparin infusion Indication: chest pain/ACS  Allergies  Allergen Reactions  . Amoxicillin Other (See Comments)    DID THE REACTION INVOLVE: Swelling of the face/tongue/throat, SOB, or low BP? Unknown Sudden or severe rash/hives, skin peeling, or the inside of the mouth or nose? Unknown Did it require medical treatment? Unknown When did it last happen?Unknown If all above answers are "NO", may proceed with cephalosporin use.   . Codeine Nausea And Vomiting  . Rapaflo [Silodosin] Other (See Comments)    Reaction:  Unknown   . Tramadol Nausea And Vomiting and Other (See Comments)    Pt states that this medication makes him feel crazy.    . Trimethoprim Other (See Comments)    Reaction: unknown    Patient Measurements: Height: 5\' 6"  (167.6 cm) Weight: 70 kg (154 lb 5.2 oz) IBW/kg (Calculated) : 63.8 HEPARIN DW (KG): 70  Vital Signs: Temp: 101 F (38.3 C) (12/09 0633) BP: 99/50 (12/09 0830) Pulse Rate: 90 (12/09 0830)  Labs: Recent Labs    11/24/2020 0629 12/17/2020 0836  HGB 11.4*  --   HCT 36.7*  --   PLT 290  --   CREATININE 1.30*  --   TROPONINIHS 330* 8,296*    Estimated Creatinine Clearance: 33.4 mL/min (A) (by C-G formula based on SCr of 1.3 mg/dL (H)).   Medical History: Past Medical History:  Diagnosis Date  . Asthma   . BPH (benign prostatic hyperplasia)   . Bronchitis   . Cancer ([place])   . COPD (chronic obstructive pulmonary disease) ([place])   . DVT of leg (deep venous thrombosis) ([place])   . Elevated PSA   . Emphysema lung ([place])   . Emphysema of lung ([place])   . Hyperlipidemia   . Nocturia   . Pulmonary fibrosis ([place])   . Pulmonary fibrosis ([place])   . Skin cancer     Assessment: 86 y.o. male presenting in respiratory distress and found to have NSTEMI. Pharmacy has been consulted for IV heparin dosing. Patient takes apixaban 5mg  prior to admission, last dose 12/8 evening.  Hgb low  normal at 11 and platelets WNL. No bleeding noted per RN.  Goal of Therapy:  Heparin level 0.3-0.7 units/ml Monitor platelets by anticoagulation protocol: Yes  Plan:  Start heparin infusion at 800 units/hr (no bolus) Obtain baseline aPTT and HL Check initial HL in 8 hours Monitor CBC, daily heparin level  Continue to monitor for signs/symptoms of bleeding F/u transition back to oral anticoagulant   Brendolyn Patty, PharmD Clinical Pharmacist  12/18/2020   9:24 AM

## 2020-11-26 NOTE — ED Notes (Signed)
Pharmacy messaged to request cefepime be sent to unit. Vancomycin held until after cefepime is intiated per MD Alfred Levins.

## 2020-11-26 NOTE — Progress Notes (Signed)
ANTICOAGULATION CONSULT NOTE - Follow Up Consult  Pharmacy Consult for Heparin Drip Indication: chest pain/ACS  Allergies  Allergen Reactions  . Amoxicillin Other (See Comments)    DID THE REACTION INVOLVE: Swelling of the face/tongue/throat, SOB, or low BP? Unknown Sudden or severe rash/hives, skin peeling, or the inside of the mouth or nose? Unknown Did it require medical treatment? Unknown When did it last happen?Unknown If all above answers are "NO", may proceed with cephalosporin use.   . Codeine Nausea And Vomiting  . Rapaflo [Silodosin] Other (See Comments)    Reaction:  Unknown   . Tramadol Nausea And Vomiting and Other (See Comments)    Pt states that this medication makes him feel crazy.    . Trimethoprim Other (See Comments)    Reaction: unknown    Patient Measurements: Height: 5\' 6"  (167.6 cm) Weight: 73.1 kg (161 lb 2.5 oz) IBW/kg (Calculated) : 63.8 Heparin Dosing Weight: 73.1 kg  Vital Signs: Temp: 97.9 F (36.6 C) (12/09 1345) Temp Source: Oral (12/09 1345) BP: 76/40 (12/09 1900) Pulse Rate: 73 (12/09 1900)  Labs: Recent Labs    11/29/2020 0629 12/04/2020 0836 11/18/2020 0948 11/22/2020 1755  HGB 11.4*  --   --   --   HCT 36.7*  --   --   --   PLT 290  --   --   --   APTT  --   --  36 64*  HEPARINUNFRC  --   --  3.60* 3.32*  CREATININE 1.30*  --   --   --   TROPONINIHS 330* 8,296*  --   --     Estimated Creatinine Clearance: 33.4 mL/min (A) (by C-G formula based on SCr of 1.3 mg/dL (H)).  Assessment: 86 y.o. male presenting in respiratory distress and found to have NSTEMI. Pharmacy has been consulted for IV heparin dosing. Patient takes apixaban 5mg  prior to admission, last dose 12/8 evening.  Hgb low normal at 11 and platelets WNL. No bleeding noted per RN.  12/9 1755 HL 3.32, aPTT 64  Goal of Therapy:  Heparin level 0.3-0.7 units/ml aPTT 66 - 102 seconds Monitor platelets by anticoagulation protocol: Yes   Plan:  12/9 17:55 HL 3.32  (elevated due to Apixaban), aPTT 64 - subtherapeutic aPTT. Will bolus with Heparin 1000 units IV and increase rate to 950 units/hr. Will need to use aPTT to guide dosing until HL correlates. Next aPTT in 8 hours. Daily CBC while on Heparin drip.  Paulina Fusi, PharmD, BCPS 11/30/2020 8:00 PM

## 2020-11-26 NOTE — Consult Note (Signed)
Consultation Note Date: 12/03/2020   Patient Name: Wesley Newton  DOB: 03-Mar-1927  MRN: [id number]  Age / Sex: 86 y.o., male  PCP: Baxter Hire, MD Referring Physician: Loletha Grayer, MD  Reason for Consultation: Establishing goals of care  HPI/Patient Profile: 86 y.o. male  with past medical history of COPD, pulmonary fibrosis on 2L oxygen, BPH, afib on Eliquis admitted on 12/16/2020 with shortness of breath and BLE edema. Reports 20lb weight gain in 2 weeks. ICU admission for acute diastolic congestive heart failure, ? NSTEMI/demand ischemia, acute on chronic respiratory failure requiring BiPAP. Cardiology following. Plan of medical management. Receiving IV diuresis. DNR code status. Palliative medicine consultation for goals of care.   Clinical Assessment and Goals of Care: PMT consult received, chart reviewed, and discussed with RN.    Per chart review, patient's granddaughter [name]) is documented HCPOA.   Met with patient and daughter, Ivin Booty at bedside. Patient is awake on BiPAP. He appears comfortable and content. Ivin Booty reports some mild confusion. Lives at [facility] and resides on the long-term nursing side.   Provided update on care plan to [facility]. She confirms that [name] is documented POA and would appreciate an update.  **Shortly after, spoke with [name] via telephone. [name] reports her grandfather's lower extremity swelling has worsened since Florinef was initiated. He has not had past history of heart failure. Reviewed course of hospitalization including diagnoses, interventions, plan of care. Discussed watchful waiting and time for outcomes with medical management. Reviewed cardiology recommendations. [name] confirms her grandfather's wish for DNR/DNI code status. Answered questions and reassured of ongoing support and updates.    SUMMARY OF RECOMMENDATIONS    Granddaughter/POA  confirms DNR/DNI code status.   Continue current plan of care and medical management. Watchful waiting. Time for outcomes.  PMT will follow.   Code Status/Advance Care Planning:  DNR/DNI  Symptom Management:   Per attending  Palliative Prophylaxis:   Aspiration, Bowel Regimen, Delirium Protocol, Frequent Pain Assessment, Oral Care and Turn Reposition  Psycho-social/Spiritual:   Desire for further Chaplaincy support: yes  Additional Recommendations: Caregiving  Support/Resources, Compassionate Wean Education and Education on Hospice  Prognosis:   Guarded  Discharge Planning: To Be Determined      Primary Diagnoses: Present on Admission: . Acute hypoxemic respiratory failure ([place])   I have reviewed the medical record, interviewed the patient and family, and examined the patient. The following aspects are pertinent.  Past Medical History:  Diagnosis Date  . Asthma   . BPH (benign prostatic hyperplasia)   . Bronchitis   . Cancer ([place])   . COPD (chronic obstructive pulmonary disease) ([place])   . DVT of leg (deep venous thrombosis) ([place])   . Elevated PSA   . Emphysema lung ([place])   . Emphysema of lung ([place])   . Hyperlipidemia   . Nocturia   . Pulmonary fibrosis ([place])   . Pulmonary fibrosis ([place])   . Skin cancer    Social History   Socioeconomic History  . Marital status: Widowed  Spouse name: Not on file  . Number of children: Not on file  . Years of education: Not on file  . Highest education level: Not on file  Occupational History  . Not on file  Tobacco Use  . Smoking status: Former Research scientist (life sciences)  . Smokeless tobacco: Never Used  . Tobacco comment: quit 40 years  Substance and Sexual Activity  . Alcohol use: No    Alcohol/week: 0.0 standard drinks  . Drug use: No  . Sexual activity: Not on file  Other Topics Concern  . Not on file  Social History Narrative  . Not on file   Social Determinants of Health   Financial Resource Strain: Not on file   Food Insecurity: Not on file  Transportation Needs: Not on file  Physical Activity: Not on file  Stress: Not on file  Social Connections: Not on file   Family History  Problem Relation Age of Onset  . CAD Mother   . CAD Father   . Heart Problems Brother   . Kidney disease Neg Hx   . Prostate cancer Neg Hx    Scheduled Meds: . amiodarone  200 mg Oral Daily  . aspirin EC  81 mg Oral Daily  . atorvastatin  20 mg Oral q1800  . azelastine  1 spray Each Nare Daily  . budesonide  0.5 mg Nebulization Daily  . Chlorhexidine Gluconate Cloth  6 each Topical Daily  . doxycycline  100 mg Oral Q12H  . feeding supplement  237 mL Oral Daily  . finasteride  5 mg Oral Daily  . fluticasone  2 spray Each Nare Daily  . furosemide  40 mg Intravenous BID  . gabapentin  100-200 mg Oral BID  . ipratropium-albuterol  3 mL Inhalation Q6H  . lactose free nutrition  237 mL Oral Q24H  . [START ON 11/27/2020] methylPREDNISolone (SOLU-MEDROL) injection  40 mg Intravenous Daily  . midodrine  10 mg Oral TID WC  . multivitamin-lutein   Oral Daily  . pantoprazole  40 mg Oral BID  . tamsulosin  0.4 mg Oral QPC supper   Continuous Infusions: . cefTRIAXone (ROCEPHIN)  IV    . heparin 800 Units/hr (12/18/2020 1040)   PRN Meds:.acetaminophen **OR** acetaminophen, LORazepam, naphazoline-glycerin, ondansetron **OR** ondansetron (ZOFRAN) IV, polyvinyl alcohol, sodium chloride, triamcinolone Medications Prior to Admission:  Prior to Admission medications   Medication Sig Start Date End Date Taking? Authorizing Provider  acetaminophen (TYLENOL) 325 MG tablet Take 650 mg by mouth every 6 (six) hours as needed for mild pain.   Yes [provider]  albuterol (PROVENTIL HFA;VENTOLIN HFA) 108 (90 Base) MCG/ACT inhaler Inhale 2 puffs into the lungs every 6 (six) hours as needed for wheezing or shortness of breath.   Yes [provider]  amiodarone (PACERONE) 200 MG tablet Take 1 tablet (200 mg total) by  mouth 2 (two) times daily. Patient taking differently: Take 200 mg by mouth daily. 05/07/19  Yes Dustin Flock, MD  apixaban (ELIQUIS) 5 MG TABS tablet Take 5 mg by mouth 2 (two) times daily.   Yes [provider]  atorvastatin (LIPITOR) 20 MG tablet Take 1 tablet (20 mg total) by mouth daily at 6 PM. 05/07/19  Yes Dustin Flock, MD  azelastine (ASTELIN) 0.1 % nasal spray Place 1 spray into both nostrils daily. 01/10/19  Yes [provider]  azithromycin (ZITHROMAX) 250 MG tablet Take 250 mg by mouth. Take as directed dose pack: 2 tablet for 1 day, then 1 tablet daily  11/25/20 11/29/20 Yes [provider]  budesonide (PULMICORT) 0.5 MG/2ML nebulizer solution Take 0.5 mg by nebulization daily.   Yes [provider]  carboxymethylcellulose 1 % ophthalmic solution Place 1 drop into both eyes every 6 (six) hours as needed (dry eyes).   Yes [provider]  finasteride (PROSCAR) 5 MG tablet Take 1 tablet (5 mg total) by mouth daily. 12/24/19  Yes McGowan, Larene [place] A, PA-C  fludrocortisone (FLORINEF) 0.1 MG tablet Take 0.05 mg by mouth daily.   Yes [provider]  fluticasone (FLONASE) 50 MCG/ACT nasal spray Place 2 sprays into both nostrils daily. 09/23/20  Yes [provider]  gabapentin (NEURONTIN) 100 MG capsule Take 100-200 mg by mouth 2 (two) times daily. Take 1 capsule (130m) by mouth every morning and 2 capsules (2065m by mouth every night. 02/04/19  Yes [provider]  ipratropium-albuterol (DUONEB) 0.5-2.5 (3) MG/3ML SOLN Inhale 3 mLs into the lungs every 6 (six) hours as needed (shortness of breath). 01/14/19  Yes [provider]  lactose free nutrition (BOOST) LIQD Take 237 mLs by mouth daily.   Yes [provider]  LORazepam (ATIVAN) 0.5 MG tablet Take 1 tablet (0.5 mg total) by mouth every 8 (eight) hours as needed for anxiety. 10/09/20  Yes ZhSharen HonesMD  midodrine (PROAMATINE) 10 MG tablet Take 1  tablet (10 mg total) by mouth 3 (three) times daily with meals. 05/07/19  Yes PaDustin FlockMD  Multiple Vitamins-Minerals (OCUVITE ADULT 50+ PO) Take 1 capsule by mouth daily.   Yes [provider]  naphazoline-glycerin (CLEAR EYES REDNESS) 0.012-0.2 % SOLN Place 1-2 drops into both eyes 4 (four) times daily as needed for eye irritation. Patient taking differently: Place 1 drop into both eyes every 6 (six) hours as needed for eye irritation (dry eyes). 12/21/18  Yes SaHenreitta LeberMD  Nutritional Supplements (ENSURE CLEAR) LIQD Take 237 mLs by mouth daily. Use when boost breeze is unavailable   Yes [provider]  pantoprazole (PROTONIX) 40 MG tablet Take 1 tablet (40 mg total) by mouth 2 (two) times daily. 05/07/19  Yes PaDustin FlockMD  predniSONE (DELTASONE) 10 MG tablet Take 10 mg by mouth daily. 09/14/20  Yes [provider]  sodium chloride (OCEAN) 0.65 % SOLN nasal spray Place 2 sprays into both nostrils every 2 (two) hours as needed for congestion.   Yes [provider]  tamsulosin (FLOMAX) 0.4 MG CAPS capsule TAKE 2 CAPSULES BY MOUTH EVERY DAY 05/13/20  Yes Vaillancourt, Samantha, PA-C  triamcinolone (KENALOG) 0.1 % Apply 1 application topically every 8 (eight) hours as needed (itching). Apply 2-3 times daily 09/08/20  Yes [provider]   Allergies  Allergen Reactions  . Amoxicillin Other (See Comments)    DID THE REACTION INVOLVE: Swelling of the face/tongue/throat, SOB, or low BP? Unknown Sudden or severe rash/hives, skin peeling, or the inside of the mouth or nose? Unknown Did it require medical treatment? Unknown When did it last happen?Unknown If all above answers are "NO", may proceed with cephalosporin use.   . Codeine Nausea And Vomiting  . Rapaflo [Silodosin] Other (See Comments)    Reaction:  Unknown   . Tramadol Nausea And Vomiting and Other (See Comments)    Pt states that this medication makes him feel crazy.    .  Trimethoprim Other (See Comments)    Reaction: unknown   Review of Systems  Unable to perform ROS: Acuity of condition   Physical Exam Vitals and  nursing note reviewed.  Constitutional:      General: He is awake.     Appearance: He is ill-appearing.  HENT:     Head: Normocephalic and atraumatic.  Cardiovascular:     Rate and Rhythm: Normal rate.  Pulmonary:     Effort: No tachypnea, accessory muscle usage or respiratory distress.     Breath sounds: Decreased breath sounds present.     Comments: BiPAP Skin:    Comments: BLE edema  Neurological:     Mental Status: He is alert.    Vital Signs: BP 110/65   Pulse 74   Temp 97.9 F (36.6 C) (Oral)   Resp (!) 27   Ht '5\' 6"'  (1.676 m)   Wt 73.1 kg   SpO2 100%   BMI 26.01 kg/m  Pain Scale: 0-10   Pain Score: 0-No pain   SpO2: SpO2: 100 % O2 Device:SpO2: 100 % O2 Flow Rate: .O2 Flow Rate (L/min): 4 L/min  IO: Intake/output summary:   Intake/Output Summary (Last 24 hours) at 11/23/2020 1543 Last data filed at 12/05/2020 1400 Gross per 24 hour  Intake 146.65 ml  Output --  Net 146.65 ml    LBM:   Baseline Weight: Weight: 70 kg Most recent weight: Weight: 73.1 kg     Palliative Assessment/Data: PPS 30%    Time Total: 15mn Greater than 50%  of this time was spent counseling and coordinating care related to the above assessment and plan.  Signed by:  MIhor Dow DNP, FNP-C Palliative Medicine Team  Phone: [phone]: [phone]  Please contact Palliative Medicine Team phone at [phone] questions and concerns.  For individual provider: See AShea Evans

## 2020-11-26 NOTE — Consult Note (Signed)
CARDIOLOGY CONSULT NOTE               Patient ID: Wesley Newton MRN: [id number] DOB/AGE: 08-26-1927 86 y.o.  Admit date: 12/02/2020 Referring Physician [place] Primary Physician [facility] Primary Cardiologist [facility] Reason for Consultation acute diastolic CHF, NSTEMI  HPI: 86 year old male referred for evaluation of acute CHF and NSTEMI. The patient has a history of paroxysmal atrial fibrillation on amiodarone and Eliquis, COPD and pulmonary fibrosis on supplemental oxygen, hypertension, carotid disease, and DVT.  The patient was admitted in October 2021 at which time he had cardiac arrest in the setting of an aspiration event.  The patient was diagnosed with pneumonia yesterday as an outpatient with a three day history of shortness of breath, and was started on azithromycin.  The patient was brought to [facility] ER today via EMS for severe respiratory distress with SPO2 in the 50s on CPAP with 3+ bilateral lower extremity edema.  The patient was started on broad-spectrum antibiotics and given IV Lasix. Chest x-ray showed increased pulmonary opacity since previous x-ray in October, favoring edema, atypical infection, or alveolitis. Admission labs notable for high sensitivity troponin 330 -->8296, BNP 238, BUN 14, creatinine 1.30, lactic acid 3.6, WBC 16.6, hemoglobin 11.4, hematocrit 36.7. ECG revealed atrial fibrillation at a rate of 88 bpm with PVCs, IVCD, and inferolateral T wave inversions. The patient was started on heparin for NSTEMI.  2D echocardiogram 10/05/2020 revealed normal left ventricular function with LVEF 60 to 65% with no regional wall motion abnormalities with mild mitral regurgitation.  Review of systems complete and found to be negative unless listed above     Past Medical History:  Diagnosis Date  . Asthma   . BPH (benign prostatic hyperplasia)   . Bronchitis   . Cancer ([place])   . COPD (chronic obstructive pulmonary disease) ([place])   . DVT of leg (deep venous  thrombosis) ([place])   . Elevated PSA   . Emphysema lung ([place])   . Emphysema of lung ([place])   . Hyperlipidemia   . Nocturia   . Pulmonary fibrosis ([place])   . Pulmonary fibrosis ([place])   . Skin cancer     Past Surgical History:  Procedure Laterality Date  . CATARACT EXTRACTION    . HERNIA REPAIR    . TONSILLECTOMY      (Not in a hospital admission)  Social History   Socioeconomic History  . Marital status: Widowed    Spouse name: Not on file  . Number of children: Not on file  . Years of education: Not on file  . Highest education level: Not on file  Occupational History  . Not on file  Tobacco Use  . Smoking status: Former Research scientist (life sciences)  . Smokeless tobacco: Never Used  . Tobacco comment: quit 40 years  Substance and Sexual Activity  . Alcohol use: No    Alcohol/week: 0.0 standard drinks  . Drug use: No  . Sexual activity: Not on file  Other Topics Concern  . Not on file  Social History Narrative  . Not on file   Social Determinants of Health   Financial Resource Strain: Not on file  Food Insecurity: Not on file  Transportation Needs: Not on file  Physical Activity: Not on file  Stress: Not on file  Social Connections: Not on file  Intimate Partner Violence: Not on file    Family History  Problem Relation Age of Onset  . CAD Mother   . CAD Father   . Heart Problems  Brother   . Kidney disease Neg Hx   . Prostate cancer Neg Hx       Review of systems complete and found to be negative unless listed above      PHYSICAL EXAM  General: elderly, frail gentleman in mild distress on CPAP HEENT:  Normocephalic and atramatic Neck:  No obvious JVD.  Lungs: increased effort of breathing on CPAP, bibasilar crackles Heart: irregularly irregular without gallops or murmurs.  Abdomen: nondistended Msk: no obvious deformities Extremities: 2-3+ bilateral lower extremity edema.   Neuro: Alert and oriented X 3. Psych:  Good affect, responds appropriately  Labs:   Lab  Results  Component Value Date   WBC 16.6 (H) 12/15/2020   HGB 11.4 (L) 11/29/2020   HCT 36.7 (L) 11/22/2020   MCV 91.1 12/17/2020   PLT 290 11/21/2020    Recent Labs  Lab 12/03/2020 0629  NA 140  K 4.0  CL 102  CO2 27  BUN 14  CREATININE 1.30*  CALCIUM 8.7*  PROT 6.5  BILITOT 1.2  ALKPHOS 53  ALT 19  AST 27  GLUCOSE 124*   Lab Results  Component Value Date   TROPONINI <0.03 05/04/2019   No results found for: CHOL No results found for: HDL No results found for: LDLCALC No results found for: TRIG No results found for: CHOLHDL No results found for: LDLDIRECT    Radiology: DG Chest Portable 1 View  Result Date: 11/27/2020 CLINICAL DATA:  Shortness of breath EXAM: PORTABLE CHEST 1 VIEW COMPARISON:  10/04/2020 FINDINGS: Chronic lung disease with honeycombing and traction bronchiectasis by CT in 2020. Worsening opacity which is symmetric. No visible effusion or pneumothorax. Normal heart size. IMPRESSION: Increased pulmonary opacity since October 2021 with symmetry favoring edema, atypical infection, or alveolitis. Pulmonary fibrosis. Electronically Signed   By: Monte Fantasia M.D.   On: 11/22/2020 06:54    EKG: atrial fibrillation, with inferolateral T wave inversions  ASSESSMENT AND PLAN:  86 year old male with a history of paroxysmal atrial fibrillation on amiodarone and Eliquis, pulmonary fibrosis and COPD on supplemental oxygen, with recent outpatient diagnosis of pneumonia with a 3-day history of shortness of breath, transported to [facility] ER via EMS on CPAP in acute respiratory distress, sepsis, and volume overload, with elevated troponin of 330 and 8296.  1. Elevated troponin, in the setting of acute CHF, sepsis, and pneumonia, with interolateral T wave inversions, with elevated high sensitivity troponin of 330-->8296. Started on heparin drip 2. Paroxysmal atrial fibrillation, on amiodarone, Eliquis held when heparin started, rate controlled 3. Acute diastolic CHF,  presenting with 3+ bilateral lower extremity edema, elevated BNP, and respiratory distress. Chest xray shows edema vs infection 4. Sepsis, possibly secondary to pneumonia with leukocytosis and fever, hypotension, and lactic acidosis 5. Chronic hypotension, on midodrine 6. CKD stage III  Recommendations: 1. Agree with conservative approach with heparin for now and hold Eliquis to reduce risk of bleeding 2. Continue amiodarone for rhythm control 3. Continue IV Lasix with careful monitoring of renal status, blood pressure, and I&Os 4. Treatment of pneumonia per hospitalist 5. Defer beta blocker due to chronic hypotension 6. Further recommendations pending patient's initial course.  Signed: Clabe Seal PA-C 12/08/2020, 10:05 AM   Discussed with Dr. Saralyn Pilar who agrees with above plan.

## 2020-11-26 NOTE — ED Notes (Signed)
Pt daughter is at the bedside, requesting information about the pt, EDP notified.

## 2020-11-26 NOTE — ED Provider Notes (Signed)
[facility] Emergency Department Provider Note  ____________________________________________  Time seen: Approximately 6:47 AM  I have reviewed the triage vital signs and the nursing notes.   HISTORY  Chief Complaint Respiratory Distress   HPI Wesley Newton is a 86 y.o. male with a history of COPD, DVT on Eliquis, pulmonary fibrosis who presents from [facility] for respiratory distress.  According to EMS patient was diagnosed with pneumonia yesterday and started on azithromycin.  This morning he was found to be hypoxic with sats in the fifties.  Patient was transported on CPAP with sats in the 50s.  Patient has had a cough.  Has pretty significant swelling of his bilateral lower extremities which according to him has been present for the last 2 months.  No known diagnosis of CHF.  Has had a fever at the nursing home.  He denies any chest pain.  Symptoms are severe and constant.   Past Medical History:  Diagnosis Date  . Asthma   . BPH (benign prostatic hyperplasia)   . Bronchitis   . Cancer ([place])   . COPD (chronic obstructive pulmonary disease) ([place])   . DVT of leg (deep venous thrombosis) ([place])   . Elevated PSA   . Emphysema lung ([place])   . Emphysema of lung ([place])   . Hyperlipidemia   . Nocturia   . Pulmonary fibrosis ([place])   . Pulmonary fibrosis ([place])   . Skin cancer     Patient Active Problem List   Diagnosis Date Noted  . Cardiac arrest ([place])   . Hypoxia   . Acute respiratory distress 10/05/2020  . AKI (acute kidney injury) ([place])   . Elevated troponin   . Positive D dimer   . Hypotension   . Acute on chronic respiratory failure ([place]) 10/04/2020  . AF (paroxysmal atrial fibrillation) ([place]) 10/04/2020  . Atrial fibrillation with RVR ([place]) 05/04/2019  . Idiopathic pulmonary fibrosis ([place]) 03/05/2019  . Malnutrition of moderate degree 12/21/2018  . Influenza B 12/19/2018  . CAP (community acquired pneumonia) 12/19/2018  . Lumbar disc  herniation 10/10/2017  . Degenerative disc disease 08/01/2017  . PAD (peripheral artery disease) ([place]) 04/25/2017  . BPH (benign prostatic hyperplasia) 01/10/2016  . Nocturia 01/10/2016  . Benign prostatic hyperplasia with urinary obstruction 01/10/2016  . AB (asthmatic bronchitis) 12/17/2014  . Carotid artery disease ([place]) 12/04/2014  . GERD (gastroesophageal reflux disease) 12/04/2014  . H/O respiratory system disease 12/04/2014  . H/O peptic ulcer 12/04/2014  . Hypercholesterolemia 12/04/2014  . Unstable angina pectoris ([place]) 12/04/2014  . Amnesia 12/04/2014  . COPD with acute exacerbation ([place]) 06/11/2014  . Cephalalgia 06/11/2014  . HTN (hypertension) 06/11/2014    Past Surgical History:  Procedure Laterality Date  . CATARACT EXTRACTION    . HERNIA REPAIR    . TONSILLECTOMY      Prior to Admission medications   Medication Sig Start Date End Date Taking? Authorizing Provider  albuterol (PROVENTIL HFA;VENTOLIN HFA) 108 (90 Base) MCG/ACT inhaler Inhale 2 puffs into the lungs every 6 (six) hours as needed for wheezing or shortness of breath.    [provider]  amiodarone (PACERONE) 200 MG tablet Take 1 tablet (200 mg total) by mouth 2 (two) times daily. Patient taking differently: Take 100 mg by mouth daily.  05/07/19   Dustin Flock, MD  apixaban (ELIQUIS) 2.5 MG TABS tablet Take 2 tablets (5 mg total) by mouth 2 (two) times daily. 10/09/20   Sharen Hones, MD  atorvastatin (LIPITOR) 20  MG tablet Take 1 tablet (20 mg total) by mouth daily at 6 PM. 05/07/19   Dustin Flock, MD  azelastine (ASTELIN) 0.1 % nasal spray Place 1 spray into both nostrils daily. 01/10/19   [provider]  budesonide (PULMICORT) 0.5 MG/2ML nebulizer solution Take 0.5 mg by nebulization daily.    [provider]  finasteride (PROSCAR) 5 MG tablet Take 1 tablet (5 mg total) by mouth daily. 12/24/19   McGowan, Larene Beach A, PA-C  fluticasone (FLONASE) 50 MCG/ACT nasal spray Place 2  sprays into both nostrils daily. 09/23/20   [provider]  gabapentin (NEURONTIN) 100 MG capsule Take 100-200 mg by mouth 2 (two) times daily. Take 1 capsule (100mg ) by mouth every morning and 2 capsules (200mg ) by mouth every night. 02/04/19   [provider]  ipratropium-albuterol (DUONEB) 0.5-2.5 (3) MG/3ML SOLN Inhale 3 mLs into the lungs 4 (four) times daily. 01/14/19   [provider]  LORazepam (ATIVAN) 0.5 MG tablet Take 1 tablet (0.5 mg total) by mouth every 8 (eight) hours as needed for anxiety. 10/09/20   Sharen Hones, MD  midodrine (PROAMATINE) 10 MG tablet Take 1 tablet (10 mg total) by mouth 3 (three) times daily with meals. 05/07/19   Dustin Flock, MD  Multiple Vitamins-Minerals (OCUVITE ADULT 50+ PO) Take 1 capsule by mouth daily.    [provider]  naphazoline-glycerin (CLEAR EYES REDNESS) 0.012-0.2 % SOLN Place 1-2 drops into both eyes 4 (four) times daily as needed for eye irritation. 12/21/18   Henreitta Leber, MD  pantoprazole (PROTONIX) 40 MG tablet Take 1 tablet (40 mg total) by mouth 2 (two) times daily. 05/07/19   Dustin Flock, MD  predniSONE (DELTASONE) 10 MG tablet Take 10 mg by mouth daily. 09/14/20   [provider]  tamsulosin (FLOMAX) 0.4 MG CAPS capsule TAKE 2 CAPSULES BY MOUTH EVERY DAY Patient taking differently: Take 0.4 mg by mouth daily after supper.  05/13/20   Vaillancourt, Aldona Bar, PA-C  triamcinolone ointment (KENALOG) 0.1 % Apply 1 application topically as directed. Apply 2-3 times daily 09/08/20   [provider]    Allergies Amoxicillin, Codeine, Rapaflo [silodosin], Tramadol, and Trimethoprim  Family History  Problem Relation Age of Onset  . Heart Problems Brother   . Kidney disease Neg Hx   . Prostate cancer Neg Hx     Social History Social History   Tobacco Use  . Smoking status: Former Research scientist (life sciences)  . Smokeless tobacco: Never Used  . Tobacco comment: quit 40 years  Substance Use Topics  .  Alcohol use: No    Alcohol/week: 0.0 standard drinks  . Drug use: No    Review of Systems  Constitutional: + fever. Eyes: Negative for visual changes. ENT: Negative for sore throat. Neck: No neck pain  Cardiovascular: Negative for chest pain. Respiratory: + shortness of breath, cough Gastrointestinal: Negative for abdominal pain, vomiting or diarrhea. Genitourinary: Negative for dysuria. Musculoskeletal: Negative for back pain. Skin: Negative for rash. Neurological: Negative for headaches, weakness or numbness. Psych: No SI or HI  ____________________________________________   PHYSICAL EXAM:  VITAL SIGNS: ED Triage Vitals  Enc Vitals Group     BP 11/20/2020 0633 135/67     Pulse Rate 11/21/2020 0633 87     Resp 11/25/2020 0633 (!) 32     Temp 12/01/2020 0633 (!) 101 F (38.3 C)     Temp src --      SpO2 11/27/2020 0633 100 %     Weight 12/14/2020 3846  154 lb 5.2 oz (70 kg)     Height 12/01/2020 0633 5\' 6"  (1.676 m)     Head Circumference --      Peak Flow --      Pain Score 12/10/2020 0643 0     Pain Loc --      Pain Edu? --      Excl. in [place]? --     Constitutional: Alert and oriented, severe respiratory distress.  HEENT:      Head: Normocephalic and atraumatic.         Eyes: Conjunctivae are normal. Sclera is non-icteric.       Mouth/Throat: Mucous membranes are moist.       Neck: Supple with no signs of meningismus. Cardiovascular: Regular rate and rhythm. No murmurs, gallops, or rubs. 2+ symmetrical distal pulses are present in all extremities.  Respiratory: Severe respiratory distress, tachypneic, hypoxic to the fifties on CPAP, crackles bilaterally Gastrointestinal: Soft, non tender, and non distended. Musculoskeletal: 3+ pitting edema bilateral lower extremities Neurologic: Normal speech and language. Face is symmetric. Moving all extremities. No gross focal neurologic deficits are appreciated. Skin: Skin is warm, dry and intact. No rash noted. Psychiatric: Mood and  affect are normal. Speech and behavior are normal.  ____________________________________________   LABS (all labs ordered are listed, but only abnormal results are displayed)  Labs Reviewed  CBC WITH DIFFERENTIAL/PLATELET - Abnormal; Notable for the following components:      Result Value   WBC 16.6 (*)    RBC 4.03 (*)    Hemoglobin 11.4 (*)    HCT 36.7 (*)    RDW 16.7 (*)    Neutro Abs 13.8 (*)    Monocytes Absolute 1.4 (*)    Abs Immature Granulocytes 0.12 (*)    All other components within normal limits  LACTIC ACID, PLASMA - Abnormal; Notable for the following components:   Lactic Acid, Venous 3.6 (*)    All other components within normal limits  COMPREHENSIVE METABOLIC PANEL - Abnormal; Notable for the following components:   Glucose, Bld 124 (*)    Creatinine, Ser 1.30 (*)    Calcium 8.7 (*)    Albumin 2.8 (*)    GFR, Estimated 52 (*)    All other components within normal limits  TROPONIN I (HIGH SENSITIVITY) - Abnormal; Notable for the following components:   Troponin I (High Sensitivity) 330 (*)    All other components within normal limits  CULTURE, BLOOD (ROUTINE X 2)  CULTURE, BLOOD (ROUTINE X 2)  RESP PANEL BY RT-PCR (FLU A&B, COVID) ARPGX2  BLOOD GAS, VENOUS  LACTIC ACID, PLASMA  BRAIN NATRIURETIC PEPTIDE  PROCALCITONIN   ____________________________________________  EKG  ED ECG REPORT I, Rudene Re, the attending physician, personally viewed and interpreted this ECG.  Limited evaluation due to significant interference from severe respiratory distress.  Normal rate with frequent PVCs and diffuse ST depressions in inferior leads. ____________________________________________  RADIOLOGY  I have personally reviewed the images performed during this visit and I agree with the Radiologist's read.   Interpretation by Radiologist:  DG Chest Portable 1 View  Result Date: 12/18/2020 CLINICAL DATA:  Shortness of breath EXAM: PORTABLE CHEST 1 VIEW  COMPARISON:  10/04/2020 FINDINGS: Chronic lung disease with honeycombing and traction bronchiectasis by CT in 2020. Worsening opacity which is symmetric. No visible effusion or pneumothorax. Normal heart size. IMPRESSION: Increased pulmonary opacity since October 2021 with symmetry favoring edema, atypical infection, or alveolitis. Pulmonary fibrosis. Electronically Signed   By: Angelica Chessman  Watts M.D.   On: 12/10/2020 06:54      ____________________________________________   PROCEDURES  Procedure(s) performed:yes .1-3 Lead EKG Interpretation Performed by: Rudene Re, MD Authorized by: Rudene Re, MD     Interpretation: abnormal     ECG rate assessment: normal     Rhythm: sinus rhythm     Ectopy: PVCs     Critical Care performed: yes  CRITICAL CARE Performed by: Rudene Re  ?  Total critical care time: 40 min  Critical care time was exclusive of separately billable procedures and treating other patients.  Critical care was necessary to treat or prevent imminent or life-threatening deterioration.  Critical care was time spent personally by me on the following activities: development of treatment plan with patient and/or surrogate as well as nursing, discussions with consultants, evaluation of patient's response to treatment, examination of patient, obtaining history from patient or surrogate, ordering and performing treatments and interventions, ordering and review of laboratory studies, ordering and review of radiographic studies, pulse oximetry and re-evaluation of patient's condition.  ____________________________________________   INITIAL IMPRESSION / ASSESSMENT AND PLAN / ED COURSE  86 y.o. male with a history of COPD, DVT on Eliquis, pulmonary fibrosis who presents from [facility] for respiratory distress.  Patient arrives in severe respiratory distress satting in the fifties on CPAP, looks grossly volume overloaded with 3+ pitting edema, elevated JVD  and crackles bilaterally.  Patient with no known history of CHF.  Review of his medical records show the patient was hospitalized back in October and had a cardiac arrest in the setting of an aspiration event.  He did have an echo done afterwards showing normal EF.  He is not on diuretics.  He was diagnosed with pneumonia yesterday at the nursing home and started on a Z-Pak.  Patient was placed on BiPAP immediately on arrival with improvement of his hypoxia.  Started on duo nebs, 125 of Solu-Medrol.  Chest x-ray visualized by me with diffuse bilateral infiltrates consistent with pulmonary edema versus multifocal pneumonia.  Clinically patient is very volume overloaded therefore will give 20 mg of IV Lasix.  Will cover with broad-spectrum antibiotics for possible pneumonia including cefepime and vancomycin especially since patient is tachycardic.  Will swab for Covid and flu.  Ddx COPD, PNA, Covid, Flu, CHF  Patient placed on telemetry for close monitoring.  Old medical records reviewed including his most recent admission in October.  _________________________ 7:25 AM on 12/03/2020 -----------------------------------------  Labs pending. Care transferred to Dr. Cherylann Banas.  Patient's respiratory status improving on BiPAP.    _____________________________________________ Please note:  Patient was evaluated in Emergency Department today for the symptoms described in the history of present illness. Patient was evaluated in the context of the global COVID-19 pandemic, which necessitated consideration that the patient might be at risk for infection with the SARS-CoV-2 virus that causes COVID-19. Institutional protocols and algorithms that pertain to the evaluation of patients at risk for COVID-19 are in a state of rapid change based on information released by regulatory bodies including the CDC and federal and state organizations. These policies and algorithms were followed during the patient's care in the ED.   Some ED evaluations and interventions may be delayed as a result of limited staffing during the pandemic.   [place] Controlled Substance Database was reviewed by me. ____________________________________________   FINAL CLINICAL IMPRESSION(S) / ED DIAGNOSES   Final diagnoses:  Acute respiratory failure with hypoxia (HCC)  Demand ischemia (HCC)      NEW MEDICATIONS  STARTED DURING THIS VISIT:  ED Discharge Orders    None       Note:  This document was prepared using Dragon voice recognition software and may include unintentional dictation errors.    Alfred Levins, [place], MD 11/25/2020 [phone]

## 2020-11-26 NOTE — ED Notes (Signed)
Report given to RN on the unit, respiratory called for transport.

## 2020-11-26 NOTE — ED Triage Notes (Signed)
Patient coming in [place] from [facility] for respiratory distress. Patient 58% on CPAP and supplemental oxygen at arrival. Respirations labored. Rhonchi, wheezing all fields. Patient placed on bipap on arrival.

## 2020-11-27 DIAGNOSIS — N179 Acute kidney failure, unspecified: Secondary | ICD-10-CM

## 2020-11-27 LAB — BASIC METABOLIC PANEL
Anion gap: 11 (ref 5–15)
BUN: 24 mg/dL — ABNORMAL HIGH (ref 8–23)
CO2: 28 mmol/L (ref 22–32)
Calcium: 8.4 mg/dL — ABNORMAL LOW (ref 8.9–10.3)
Chloride: 101 mmol/L (ref 98–111)
Creatinine, Ser: 1.68 mg/dL — ABNORMAL HIGH (ref 0.61–1.24)
GFR, Estimated: 38 mL/min — ABNORMAL LOW (ref >60)
Glucose, Bld: 143 mg/dL — ABNORMAL HIGH (ref 70–99)
Potassium: 4.1 mmol/L (ref 3.5–5.1)
Sodium: 140 mmol/L (ref 135–145)

## 2020-11-27 LAB — CBC
HCT: 32.7 % — ABNORMAL LOW (ref 39.0–52.0)
Hemoglobin: 10.5 g/dL — ABNORMAL LOW (ref 13.0–17.0)
MCH: 28.8 pg (ref 26.0–34.0)
MCHC: 32.1 g/dL (ref 30.0–36.0)
MCV: 89.8 fL (ref 80.0–100.0)
Platelets: 269 10*3/uL (ref 150–400)
RBC: 3.64 MIL/uL — ABNORMAL LOW (ref 4.22–5.81)
RDW: 16.6 % — ABNORMAL HIGH (ref 11.5–15.5)
WBC: 18.6 10*3/uL — ABNORMAL HIGH (ref 4.0–10.5)
nRBC: 0 % (ref 0.0–0.2)

## 2020-11-27 LAB — APTT
aPTT: 77 seconds — ABNORMAL HIGH (ref 24–36)
aPTT: 52 seconds — ABNORMAL HIGH (ref 24–36)
aPTT: 65 seconds — ABNORMAL HIGH (ref 24–36)

## 2020-11-27 LAB — LIPID PANEL
Cholesterol: 136 mg/dL (ref 0–200)
HDL: 61 mg/dL (ref >40)
LDL Cholesterol: 62 mg/dL (ref 0–99)
Total CHOL/HDL Ratio: 2.2 RATIO
Triglycerides: 65 mg/dL (ref <150)
VLDL: 13 mg/dL (ref 0–40)

## 2020-11-27 LAB — TROPONIN I (HIGH SENSITIVITY): Troponin I (High Sensitivity): 17762 ng/L (ref <18)

## 2020-11-27 LAB — PROCALCITONIN: Procalcitonin: 4.47 ng/mL

## 2020-11-27 MED ORDER — POTASSIUM CHLORIDE CRYS ER 20 MEQ PO TBCR
10.0000 meq | EXTENDED_RELEASE_TABLET | Freq: Two times a day (BID) | ORAL | Status: DC
  Administered 2020-11-27 (×2): 10 meq via ORAL
  Filled 2020-11-27 (×2): qty 1

## 2020-11-27 MED ORDER — ALBUTEROL SULFATE (2.5 MG/3ML) 0.083% IN NEBU: 2.5000 mg | INHALATION_SOLUTION | RESPIRATORY_TRACT | Status: DC | PRN

## 2020-11-27 MED ORDER — METOPROLOL SUCCINATE ER 25 MG PO TB24
12.5000 mg | ORAL_TABLET | Freq: Every day | ORAL | Status: DC
  Administered 2020-11-27: 12.5 mg via ORAL
  Filled 2020-11-27: qty 0.5

## 2020-11-27 MED ORDER — FUROSEMIDE 20 MG PO TABS
20.0000 mg | ORAL_TABLET | Freq: Two times a day (BID) | ORAL | Status: DC
  Administered 2020-11-27: 20 mg via ORAL
  Filled 2020-11-27: qty 1

## 2020-11-27 MED ORDER — HEPARIN BOLUS VIA INFUSION
2000.0000 [IU] | Freq: Once | INTRAVENOUS | Status: AC
  Administered 2020-11-27: 2000 [IU] via INTRAVENOUS
  Filled 2020-11-27: qty 2000

## 2020-11-27 MED ORDER — FUROSEMIDE 10 MG/ML IJ SOLN
40.0000 mg | Freq: Once | INTRAMUSCULAR | Status: AC
  Administered 2020-11-27: 40 mg via INTRAVENOUS
  Filled 2020-11-27: qty 4

## 2020-11-27 NOTE — Progress Notes (Signed)
[facility] Liaison Note; This patient is currently followed by [facility] out patient Palliative services at [facility]. TOC Adelene Amas and Palliative NP Ihor Dow made aware. Liaison will continue to follow for disposition.  Flo Shanks BSN, RN, [place] [phone]

## 2020-11-27 NOTE — Progress Notes (Signed)
Daily Progress Note   Patient Name: Wesley Newton       Date: 11/27/2020 DOB: 07/24/1927  Age: 86 y.o. MRN#: [id number] Attending Physician: Loletha Grayer, MD Primary Care Physician: Baxter Hire, MD Admit Date: 12/02/2020  Reason for Consultation/Follow-up: Establishing goals of care  Subjective: Patient awake, alert, oriented and able to participate in discussion. Off BiPAP now on HFNC. Taking bites of lunch.   GOC:  Son, [name] at bedside. Provided update on diagnoses, interventions, plan of care and cardiology recommendations. Discussed concern with balancing act with heart, lung, and kidney involvement today with worsening renal function and dyspnea. Also discussed underlying irreversible lung conditions.   [name] has a good understanding of his condition. [name] shares his father's comment earlier to Dr. Leslye Peer about 'is it worth going on?' Discussed concern that he may be nearing a point where symptom management, comfort focused care plan, and initiation of hospice services will provide him better relief of symptoms (since he remains short of breath today despite aggressive medical management). Discussed focus on comfort measures, quality of life, allowing him to spend quality time with family, and allowing nature to take course as he declines. [name] shares that going on like this is 'hurting my family' and 'hurting me.' Explained that none of [place] wish to see him suffer or struggle, especially if time is short.   [name] is agreeable with ongoing medical management through the weekend. Watchful waiting. Discussed medical optimization, tune him up the best we can and consider discharge with hospice plan of care. [name] and [name] are open to this idea. Reassured them that I will discuss with  granddaughter/POA [name]) and f/u palliative discussion can occur on Monday.  **Shortly after, spoke with [name] via telephone to discuss plan of care. She just got off the phone with Dr. Leslye Peer and has a good understanding of her grandfather's condition and tenuous prognosis. She acknowledges his body is 'much weaker' than when he was hospitalized in October. She agrees with conservative cardiology management, watchful waiting through the weekend, and further goals of care/hospice discussions early next week when PMT provider returns to [facility]. Answered questions.    Length of Stay: 1  Current Medications: Scheduled Meds:  . amiodarone  200 mg Oral Daily  . aspirin EC  81 mg Oral Daily  . atorvastatin  20 mg Oral q1800  . azelastine  1 spray Each Nare Daily  . budesonide  0.5 mg Nebulization Daily  . Chlorhexidine Gluconate Cloth  6 each Topical Daily  . doxycycline  100 mg Oral Q12H  . feeding supplement  237 mL Oral Daily  . finasteride  5 mg Oral Daily  . fluticasone  2 spray Each Nare Daily  . furosemide  40 mg Intravenous Once  . gabapentin  100 mg Oral Daily  . gabapentin  200 mg Oral QHS  . ipratropium-albuterol  3 mL Inhalation Q6H  . lactose free nutrition  237 mL Oral Q24H  . methylPREDNISolone (SOLU-MEDROL) injection  40 mg Intravenous Daily  . metoprolol succinate  12.5 mg Oral QHS  . midodrine  10 mg Oral TID WC  . multivitamin-lutein   Oral Daily  . pantoprazole  40 mg Oral BID  . potassium chloride  10 mEq Oral BID  . tamsulosin  0.4 mg Oral QPC supper    Continuous Infusions: . cefTRIAXone (ROCEPHIN)  IV Stopped (12/08/2020 1908)  . heparin 950 Units/hr (11/27/20 0429)    PRN Meds: acetaminophen **OR** acetaminophen, albuterol, LORazepam, naphazoline-glycerin, ondansetron **OR** ondansetron (ZOFRAN) IV, polyvinyl alcohol, sodium chloride, triamcinolone  Physical Exam Vitals and nursing note reviewed.  Constitutional:      General: He is awake.     Appearance:  He is ill-appearing.  Cardiovascular:     Rate and Rhythm: Rhythm irregularly irregular.  Pulmonary:     Effort: No tachypnea, accessory muscle usage or respiratory distress.     Comments: Intermittent dyspnea at rest. HFNC Neurological:     Mental Status: He is alert and oriented to person, place, and time.  Psychiatric:        Mood and Affect: Mood normal.        Speech: Speech normal.        Behavior: Behavior normal.            Vital Signs: BP 127/60   Pulse 78   Temp (!) 97.5 F (36.4 C) (Axillary)   Resp (!) 21   Ht 5\' 6"  (1.676 m)   Wt 73.1 kg   SpO2 97%   BMI 26.01 kg/m  SpO2: SpO2: 97 % O2 Device: O2 Device: High Flow Nasal Cannula O2 Flow Rate: O2 Flow Rate (L/min): 40 L/min  Intake/output summary:   Intake/Output Summary (Last 24 hours) at 11/27/2020 1321 Last data filed at 11/27/2020 0429 Gross per 24 hour  Intake 384.66 ml  Output 500 ml  Net -115.34 ml   LBM:   Baseline Weight: Weight: 70 kg Most recent weight: Weight: 73.1 kg       Palliative Assessment/Data: PPS 40%      Patient Active Problem List   Diagnosis Date Noted  . Acute hypoxemic respiratory failure ([place]) 12/01/2020  . Acute diastolic CHF (congestive heart failure) ([place])   . NSTEMI (non-ST elevated myocardial infarction) ([place])   . Severe sepsis ([place])   . Stage 3a chronic kidney disease ([place])   . Chronic a-fib ([place])   . Cardiac arrest ([place])   . Hypoxia   . Acute respiratory distress 10/05/2020  . Acute renal failure superimposed on stage 3a chronic kidney disease ([place])   . Elevated troponin   . Positive D dimer   . Hypotension   . Acute on chronic respiratory failure ([place]) 10/04/2020  . AF (paroxysmal atrial fibrillation) ([place]) 10/04/2020  . Atrial fibrillation with RVR ([place]) 05/04/2019  . Idiopathic pulmonary fibrosis ([place])  03/05/2019  . Malnutrition of moderate degree 12/21/2018  . Influenza B 12/19/2018  . CAP (community acquired pneumonia) 12/19/2018  . Lumbar disc  herniation 10/10/2017  . Degenerative disc disease 08/01/2017  . PAD (peripheral artery disease) ([place]) 04/25/2017  . BPH (benign prostatic hyperplasia) 01/10/2016  . Nocturia 01/10/2016  . Benign prostatic hyperplasia with urinary obstruction 01/10/2016  . AB (asthmatic bronchitis) 12/17/2014  . Carotid artery disease ([place]) 12/04/2014  . GERD (gastroesophageal reflux disease) 12/04/2014  . H/O respiratory system disease 12/04/2014  . H/O peptic ulcer 12/04/2014  . Hypercholesterolemia 12/04/2014  . Unstable angina pectoris ([place]) 12/04/2014  . Amnesia 12/04/2014  . COPD with acute exacerbation ([place]) 06/11/2014  . Cephalalgia 06/11/2014  . HTN (hypertension) 06/11/2014    Palliative Care Assessment & Plan   Patient Profile: 86 y.o. male  with past medical history of COPD, pulmonary fibrosis on 2L oxygen, BPH, afib on Eliquis admitted on 11/25/2020 with shortness of breath and BLE edema. Reports 20lb weight gain in 2 weeks. ICU admission for acute diastolic congestive heart failure, ? NSTEMI/demand ischemia, acute on chronic respiratory failure requiring BiPAP. Cardiology following. Plan of medical management. Receiving IV diuresis. DNR code status. Palliative medicine consultation for goals of care.   Assessment: Acute on chronic hypoxic respiratory failure COPD/pulmonary fibrosis Acute diastolic congestive heart failure NSTEMI Severe sepsis Chronic hypotension ARF on CKD stage IIIA  Recommendations/Plan:   DNR/DNI  Continue current plan of care and medical management through the weekend. Watchful waiting.   Medical optimization. PMT provider f/u Monday morning. Patient/family considering comfort focused care plan and hospice options on discharge.   Code Status: DNR/DNI   Code Status Orders  (From admission, onward)         Start     Ordered   12/10/2020 0924  Do not attempt resuscitation (DNR)  Continuous       Question Answer Comment  In the event of cardiac or  respiratory ARREST Do not call a "code blue"   In the event of cardiac or respiratory ARREST Do not perform Intubation, CPR, defibrillation or ACLS   In the event of cardiac or respiratory ARREST Use medication by any route, position, wound care, and other measures to relive pain and suffering. May use oxygen, suction and manual treatment of airway obstruction as needed for comfort.   Comments nurse may pronounce      12/13/2020 0924        Code Status History    Date Active Date Inactive Code Status Order ID Comments User Context   10/06/2020 0919 10/09/2020 2155 DNR [id number]  Flora Lipps, MD Inpatient   10/04/2020 2348 10/06/2020 0919 Full Code [id number]  Toy Baker, MD ED   05/05/2019 0034 05/07/2019 1858 DNR [id number]  Lance Coon, MD Inpatient   03/03/2019 2208 03/05/2019 1734 DNR [id number]  Arta Silence, MD Inpatient   12/19/2018 0234 12/21/2018 1734 Full Code [id number]  Lance Coon, MD Inpatient   Advance Care Planning Activity       Prognosis:   Guarded-poor prognosis  Discharge Planning:  To Be Determined  Care plan was discussed with RN, patient, son/granddaughter, granddaughter, Dr. Leslye Peer  Thank you for allowing the Palliative Medicine Team to assist in the care of this patient.   Total Time 40 Prolonged Time Billed  no      Greater than 50%  of this time was spent counseling and coordinating care related to the above assessment and plan.  Ihor Dow, DNP, FNP-C Palliative Medicine Team  Phone: [phone] Fax: [phone]  Please contact Palliative Medicine Team phone at [phone] for questions and concerns.

## 2020-11-27 NOTE — Progress Notes (Signed)
Patient ID: Wesley Newton, male   DOB: [birth year], 86 y.o.   MRN: [id number] [facility] PROGRESS NOTE  Wesley Newton LFY:[id number] DOB: 1927-03-08 DOA: 11/20/2020 PCP: Baxter Hire, MD  HPI/Subjective: Patient seen early morning and was on regular nasal cannula and breathing okay.  Patient seen early afternoon and was placed on heated high flow nasal cannula and was having some more difficulty breathing.  Admitted yesterday with CHF and NSTEMI.  Objective: Vitals:   11/27/20 1100 11/27/20 1200  BP: 110/68 127/60  Pulse: 74 78  Resp: (!) 25 (!) 21  Temp:    SpO2: 100% 97%    Intake/Output Summary (Last 24 hours) at 11/27/2020 1303 Last data filed at 11/27/2020 0429 Gross per 24 hour  Intake 384.66 ml  Output 500 ml  Net -115.34 ml   Filed Weights   12/05/2020 0633 11/22/2020 1345 11/27/20 0429  Weight: 70 kg 73.1 kg 73.1 kg    ROS: Review of Systems  Respiratory: Positive for cough and shortness of breath.   Cardiovascular: Negative for chest pain.  Gastrointestinal: Negative for abdominal pain, nausea and vomiting.   Exam: Physical Exam HENT:     Head: Normocephalic.     Mouth/Throat:     Pharynx: No oropharyngeal exudate.  Eyes:     General: Lids are normal.     Conjunctiva/sclera: Conjunctivae normal.     Pupils: Pupils are equal, round, and reactive to light.  Cardiovascular:     Rate and Rhythm: Normal rate. Rhythm irregularly irregular.     Heart sounds: Normal heart sounds, S1 normal and S2 normal.  Pulmonary:     Breath sounds: Examination of the right-lower field reveals decreased breath sounds and rales. Examination of the left-lower field reveals decreased breath sounds and rales. Decreased breath sounds and rales present. No wheezing or rhonchi.     Comments: Early morning lungs were clear.  This afternoon did hear rales at the bases. Abdominal:     Palpations: Abdomen is soft.     Tenderness: There is no abdominal tenderness.   Musculoskeletal:     Right lower leg: Swelling present.     Left lower leg: Swelling present.  Skin:    General: Skin is warm.     Findings: No rash.  Neurological:     Mental Status: He is alert and oriented to person, place, and time.       Data Reviewed: Basic Metabolic Panel: Recent Labs  Lab 12/16/2020 0629 11/27/20 0419  NA 140 140  K 4.0 4.1  CL 102 101  CO2 27 28  GLUCOSE 124* 143*  BUN 14 24*  CREATININE 1.30* 1.68*  CALCIUM 8.7* 8.4*   Liver Function Tests: Recent Labs  Lab 11/29/2020 0629  AST 27  ALT 19  ALKPHOS 53  BILITOT 1.2  PROT 6.5  ALBUMIN 2.8*   CBC: Recent Labs  Lab 11/18/2020 0629 11/27/20 0419  WBC 16.6* 18.6*  NEUTROABS 13.8*  --   HGB 11.4* 10.5*  HCT 36.7* 32.7*  MCV 91.1 89.8  PLT 290 269   BNP (last 3 results) Recent Labs    10/04/20 1357 11/27/2020 0629  BNP 166.3* 238.3*    CBG: Recent Labs  Lab 12/04/2020 1343  GLUCAP 215*    Recent Results (from the past 240 hour(s))  Blood culture (routine x 2)     Status: None (Preliminary result)   Collection Time: 11/20/2020  6:29 AM   Specimen: BLOOD  Result Value Ref Range Status  Specimen Description BLOOD RIGHT FA  Final   Special Requests   Final    BOTTLES DRAWN AEROBIC AND ANAEROBIC Blood Culture results may not be optimal due to an excessive volume of blood received in culture bottles   Culture   Final    NO GROWTH 1 DAY Performed at [facility], [address] [place], [place] [address]    Report Status PENDING  Incomplete  Blood culture (routine x 2)     Status: None (Preliminary result)   Collection Time: 11/21/2020  6:37 AM   Specimen: BLOOD  Result Value Ref Range Status   Specimen Description BLOOD LEFT FA  Final   Special Requests   Final    BOTTLES DRAWN AEROBIC AND ANAEROBIC Blood Culture results may not be optimal due to an inadequate volume of blood received in culture bottles   Culture   Final    NO GROWTH 1 DAY Performed at [facility], [address], [place], [place] [address]    Report Status PENDING  Incomplete  Resp Panel by RT-PCR (Flu A&B, Covid) Nasopharyngeal Swab     Status: None   Collection Time: 12/04/2020  6:37 AM   Specimen: Nasopharyngeal Swab; Nasopharyngeal(NP) swabs in vial transport medium  Result Value Ref Range Status   SARS Coronavirus 2 by RT PCR NEGATIVE NEGATIVE Final    Comment: (NOTE) SARS-CoV-2 target nucleic acids are NOT DETECTED.  The SARS-CoV-2 RNA is generally detectable in upper respiratory specimens during the acute phase of infection. The lowest concentration of SARS-CoV-2 viral copies this assay can detect is 138 copies/mL. A negative result does not preclude SARS-Cov-2 infection and should not be used as the sole basis for treatment or other patient management decisions. A negative result may occur with  improper specimen collection/handling, submission of specimen other than nasopharyngeal swab, presence of viral mutation(s) within the areas targeted by this assay, and inadequate number of viral copies(<138 copies/mL). A negative result must be combined with clinical observations, patient history, and epidemiological information. The expected result is Negative.  Fact Sheet for Patients:  EntrepreneurPulse.com.au  Fact Sheet for Healthcare Providers:  IncredibleEmployment.be  This test is no t yet approved or cleared by the [place] FDA and  has been authorized for detection and/or diagnosis of SARS-CoV-2 by FDA under an Emergency Use Authorization (EUA). This EUA will remain  in effect (meaning this test can be used) for the duration of the COVID-19 declaration under Section 564(b)(1) of the Act, 21 U.S.C.section 360bbb-3(b)(1), unless the authorization is terminated  or revoked sooner.       Influenza A by PCR NEGATIVE NEGATIVE Final   Influenza B by PCR NEGATIVE NEGATIVE Final    Comment: (NOTE) The Xpert Xpress  SARS-CoV-2/FLU/RSV plus assay is intended as an aid in the diagnosis of influenza from Nasopharyngeal swab specimens and should not be used as a sole basis for treatment. Nasal washings and aspirates are unacceptable for Xpert Xpress SARS-CoV-2/FLU/RSV testing.  Fact Sheet for Patients: EntrepreneurPulse.com.au  Fact Sheet for Healthcare Providers: IncredibleEmployment.be  This test is not yet approved or cleared by the [place] FDA and has been authorized for detection and/or diagnosis of SARS-CoV-2 by FDA under an Emergency Use Authorization (EUA). This EUA will remain in effect (meaning this test can be used) for the duration of the COVID-19 declaration under Section 564(b)(1) of the Act, 21 U.S.C. section 360bbb-3(b)(1), unless the authorization is terminated or revoked.  Performed at [facility], [place]  [address] [place], [place] [address]   MRSA PCR Screening     Status: None   Collection Time: 11/27/2020  1:48 PM   Specimen: Nasopharyngeal  Result Value Ref Range Status   MRSA by PCR NEGATIVE NEGATIVE Final    Comment:        The GeneXpert MRSA Assay (FDA approved for NASAL specimens only), is one component of a comprehensive MRSA colonization surveillance program. It is not intended to diagnose MRSA infection nor to guide or monitor treatment for MRSA infections. Performed at [facility], [address] [place], [place] [address]      Studies: DG Chest Portable 1 View  Result Date: 12/02/2020 CLINICAL DATA:  Shortness of breath EXAM: PORTABLE CHEST 1 VIEW COMPARISON:  10/04/2020 FINDINGS: Chronic lung disease with honeycombing and traction bronchiectasis by CT in 2020. Worsening opacity which is symmetric. No visible effusion or pneumothorax. Normal heart size. IMPRESSION: Increased pulmonary opacity since October 2021 with symmetry favoring edema, atypical infection, or alveolitis. Pulmonary fibrosis.  Electronically Signed   By: Monte Fantasia M.D.   On: 12/02/2020 06:54    Scheduled Meds: . amiodarone  200 mg Oral Daily  . aspirin EC  81 mg Oral Daily  . atorvastatin  20 mg Oral q1800  . azelastine  1 spray Each Nare Daily  . budesonide  0.5 mg Nebulization Daily  . Chlorhexidine Gluconate Cloth  6 each Topical Daily  . doxycycline  100 mg Oral Q12H  . feeding supplement  237 mL Oral Daily  . finasteride  5 mg Oral Daily  . fluticasone  2 spray Each Nare Daily  . furosemide  40 mg Intravenous Once  . furosemide  40 mg Intravenous Once  . gabapentin  100 mg Oral Daily  . gabapentin  200 mg Oral QHS  . ipratropium-albuterol  3 mL Inhalation Q6H  . lactose free nutrition  237 mL Oral Q24H  . methylPREDNISolone (SOLU-MEDROL) injection  40 mg Intravenous Daily  . midodrine  10 mg Oral TID WC  . multivitamin-lutein   Oral Daily  . pantoprazole  40 mg Oral BID  . tamsulosin  0.4 mg Oral QPC supper   Continuous Infusions: . cefTRIAXone (ROCEPHIN)  IV Stopped (11/22/2020 1908)  . heparin 950 Units/hr (11/27/20 0429)    Assessment/Plan:  1. Acute on chronic hypoxic respiratory failure.  Patient chronically wears 2 L of oxygen.  This morning was on regular nasal cannula but early afternoon was on heated high flow nasal cannula 65% FiO2. 2. Acute diastolic congestive heart failure.  This morning since creatinine went up I tried to change the Lasix to oral but will have to switch back to IV since worsening respiratory status.  We will continue Lasix 40 mg IV twice a day.  Try to add low-dose Toprol at night. 3. NSTEMI.  Continue heparin drip.  [place] cardiology consultation.  Continue aspirin, heparin.  Try to add low-dose Toprol at night. 4. Severe sepsis, present on admission with possible pneumonia, leukocytosis and fever of 101.  Patient has hypotension, congestive heart failure NSTEMI and worsening respiratory failure.  Continue antibiotics Rocephin and doxycycline and follow-up  cultures. 5. Chronic hypotension on midodrine to lift up blood pressure.  I discontinued Florinef because this can also cause fluid retention. 6. BPH on Proscar and Flomax.  With hypotension will have to decrease dose of Flomax to 0.4 mg daily. 7. Acute renal failure on chronic kidney disease stage IIIa.  Creatinine increased by .38 (>.3mg /dl within 48hours)  from 1.30 yesterday to 1.68 today.  I tried to switch Lasix over to oral but with worsening respiratory status this afternoon we will have to switch back to IV and continue to watch creatinine closely. 8. Palliative care consultation. 9. Deep tissue injury on buttock,present on admission see description below. 10. Pulmonary fibrosis/COPD on empiric steroids and antibiotics  Pressure Injury 12/13/2020 Buttocks Mid Deep Tissue Pressure Injury - Purple or maroon localized area of discolored intact skin or blood-filled blister due to damage of underlying soft tissue from pressure and/or shear. (Active)  12/11/2020 1345  Location: Buttocks  Location Orientation: Mid  Staging: Deep Tissue Pressure Injury - Purple or maroon localized area of discolored intact skin or blood-filled blister due to damage of underlying soft tissue from pressure and/or shear.  Wound Description (Comments):   Present on Admission: Yes       Code Status:     Code Status Orders  (From admission, onward)         Start     Ordered   12/12/2020 0924  Do not attempt resuscitation (DNR)  Continuous       Question Answer Comment  In the event of cardiac or respiratory ARREST Do not call a "code blue"   In the event of cardiac or respiratory ARREST Do not perform Intubation, CPR, defibrillation or ACLS   In the event of cardiac or respiratory ARREST Use medication by any route, position, wound care, and other measures to relive pain and suffering. May use oxygen, suction and manual treatment of airway obstruction as needed for comfort.   Comments nurse may pronounce       12/06/2020 0924        Code Status History    Date Active Date Inactive Code Status Order ID Comments User Context   10/06/2020 0919 10/09/2020 2155 DNR [id number]  Flora Lipps, MD Inpatient   10/04/2020 2348 10/06/2020 0919 Full Code [id number]  Toy Baker, MD ED   05/05/2019 0034 05/07/2019 1858 DNR [id number]  Lance Coon, MD Inpatient   03/03/2019 2208 03/05/2019 1734 DNR [id number]  Arta Silence, MD Inpatient   12/19/2018 0234 12/21/2018 1734 Full Code [id number]  Lance Coon, MD Inpatient   Advance Care Planning Activity     Family Communication: Spoke with son at the bedside.  Left message for POA Audra on the phone. Disposition Plan: Status is: Inpatient  Dispo: The patient is from: Home              Anticipated d/c is to: To be determined based on clinical course              Anticipated d/c date is: Next week.              Patient currently not medically stable for disposition at this time.  With worsening creatinine with IV diuresis I tried to change him over to oral Lasix but with worsening respiratory status need to check back to IV Lasix at this time.  Consultants:  Cardiology  Antibiotics:  Rocephin  Doxycycline  Time spent: 32 minutes  [place]

## 2020-11-27 NOTE — Progress Notes (Signed)
ANTICOAGULATION CONSULT NOTE  Pharmacy Consult for Heparin Drip Indication: chest pain/ACS  Patient Measurements: Heparin Dosing Weight: 73.1 kg  Labs: Recent Labs    11/25/2020 0629 12/05/2020 0836 12/08/2020 0948 11/27/2020 0948 12/09/2020 1755 11/27/20 0419 11/27/20 1253  HGB 11.4*  --   --   --   --  10.5*  --   HCT 36.7*  --   --   --   --  32.7*  --   PLT 290  --   --   --   --  269  --   APTT  --   --  36   < > 64* 77* 52*  HEPARINUNFRC  --   --  3.60*  --  3.32*  --   --   CREATININE 1.30*  --   --   --   --  1.68*  --   TROPONINIHS 330* 8,296*  --   --   --  81,771*  --    < > = values in this interval not displayed.    Estimated Creatinine Clearance: 25.8 mL/min (A) (by C-G formula based on SCr of 1.68 mg/dL (H)).  Assessment: 86 y.o. male presenting in respiratory distress and found to have NSTEMI. Pharmacy has been consulted for IV heparin dosing. Patient takes apixaban 5mg  prior to admission, last dose 12/8 evening.  CBC monitoring: --Hgb 11.4 >> 10.5 --Plt 290 >> 269  Heparin course: --12/9 at 1040 heparin started at 800 units/hr --12/9 at 1755 aPTT 64s, heparin 1000 unit bolus x 1 and rate increased to 950 units/hr --12/10 at 0419 aPTT 77s, heparin continued at 950 units/hr --12/10 at 1253 aPTT 52s, heparin 2000 unit bolus x 1 and rate increased to 1150 units/hr  Goal of Therapy:  Heparin level 0.3-0.7 units/ml aPTT 66 - 102 seconds Monitor platelets by anticoagulation protocol: Yes   Plan:  --Will order heparin 2000 unit IV bolus x 1 and increase heparin infusion to 1150 units/hr --Re-check aPTT 8 hours after rate change --Continue to monitor CBC daily per protocol while on heparin infusion  Benita Gutter 11/27/2020 2:14 PM

## 2020-11-27 NOTE — Progress Notes (Signed)
ANTICOAGULATION CONSULT NOTE - Follow Up Consult  Pharmacy Consult for Heparin Drip Indication: chest pain/ACS  Allergies  Allergen Reactions  . Amoxicillin Other (See Comments)    DID THE REACTION INVOLVE: Swelling of the face/tongue/throat, SOB, or low BP? Unknown Sudden or severe rash/hives, skin peeling, or the inside of the mouth or nose? Unknown Did it require medical treatment? Unknown When did it last happen?Unknown If all above answers are "NO", may proceed with cephalosporin use.   . Codeine Nausea And Vomiting  . Rapaflo [Silodosin] Other (See Comments)    Reaction:  Unknown   . Tramadol Nausea And Vomiting and Other (See Comments)    Pt states that this medication makes him feel crazy.    . Trimethoprim Other (See Comments)    Reaction: unknown    Patient Measurements: Height: 5\' 6"  (167.6 cm) Weight: 73.1 kg (161 lb 2.5 oz) IBW/kg (Calculated) : 63.8 Heparin Dosing Weight: 73.1 kg  Vital Signs: Temp: 98.4 F (36.9 C) (12/10 0400) Temp Source: Axillary (12/10 0400) BP: 104/54 (12/10 0500) Pulse Rate: 70 (12/10 0500)  Labs: Recent Labs    12/06/2020 0629 11/19/2020 0836 12/09/2020 0948 11/20/2020 1755 11/27/20 0419  HGB 11.4*  --   --   --  10.5*  HCT 36.7*  --   --   --  32.7*  PLT 290  --   --   --  269  APTT  --   --  36 64* 77*  HEPARINUNFRC  --   --  3.60* 3.32*  --   CREATININE 1.30*  --   --   --  1.68*  TROPONINIHS 330* 8,296*  --   --   --     Estimated Creatinine Clearance: 25.8 mL/min (A) (by C-G formula based on SCr of 1.68 mg/dL (H)).  Assessment: 86 y.o. male presenting in respiratory distress and found to have NSTEMI. Pharmacy has been consulted for IV heparin dosing. Patient takes apixaban 5mg  prior to admission, last dose 12/8 evening.  Hgb low normal at 11 and platelets WNL. No bleeding noted per RN.  12/9 1755 HL 3.32, aPTT 64 12/10 @ 0419 aPTT 77, therapeutic x 1  Goal of Therapy:  Heparin level 0.3-0.7 units/ml aPTT 66 - 102  seconds Monitor platelets by anticoagulation protocol: Yes   Plan:  aPTT = 77 - therapeutic. Will Continue Heparin rate at 950 units/hr.  Will need to use aPTT to guide dosing until HL correlates. Next aPTT in 8 hours to confirm. Daily CBC while on Heparin drip.  Hart Robinsons, PharmD Clinical Pharmacist   11/27/2020 5:49 AM

## 2020-11-27 NOTE — Progress Notes (Signed)
[facility] Cardiology    SUBJECTIVE: The patient denies chest pain, reports lower rib pain from previous chest compressions. He reports that it feels difficult to take a deep breath. He denies palpitations.   Vitals:   11/27/20 1000 11/27/20 1100 11/27/20 1200 11/27/20 1300  BP: 115/71 110/68 127/60 133/61  Pulse: 85 74 78 79  Resp: 20 (!) 25 (!) 21 (!) 23  Temp:      TempSrc:      SpO2: (!) 89% 100% 97% 96%  Weight:      Height:         Intake/Output Summary (Last 24 hours) at 11/27/2020 1338 Last data filed at 11/27/2020 0429 Gross per 24 hour  Intake 384.66 ml  Output 500 ml  Net -115.34 ml      PHYSICAL EXAM  General: Ill-appearing, frail elderly gentleman sitting up in bed eating lunch, in mild respiratory distress HEENT:  Normocephalic and atramatic Lungs: increased effort of breathing with speaking and eating, diminished breath sounds Heart: HRRR . Normal S1 and S2 without gallops or murmurs.  Abdomen: Bowel sounds are positive, abdomen soft and non-tender  Msk:  Back normal, normal gait. Normal strength and tone for age. Extremities: 1-2+ bilateral pedal edema.   Neuro: Alert and oriented X 3. Psych:  Good affect, responds appropriately   LABS: Basic Metabolic Panel: Recent Labs    12/13/2020 0629 11/27/20 0419  NA 140 140  K 4.0 4.1  CL 102 101  CO2 27 28  GLUCOSE 124* 143*  BUN 14 24*  CREATININE 1.30* 1.68*  CALCIUM 8.7* 8.4*   Liver Function Tests: Recent Labs    11/23/2020 0629  AST 27  ALT 19  ALKPHOS 53  BILITOT 1.2  PROT 6.5  ALBUMIN 2.8*   No results for input(s): LIPASE, AMYLASE in the last 72 hours. CBC: Recent Labs    11/19/2020 0629 11/27/20 0419  WBC 16.6* 18.6*  NEUTROABS 13.8*  --   HGB 11.4* 10.5*  HCT 36.7* 32.7*  MCV 91.1 89.8  PLT 290 269   Cardiac Enzymes: No results for input(s): CKTOTAL, CKMB, CKMBINDEX, TROPONINI in the last 72 hours. BNP: Invalid input(s): POCBNP D-Dimer: No results for input(s): DDIMER in the  last 72 hours. Hemoglobin A1C: No results for input(s): HGBA1C in the last 72 hours. Fasting Lipid Panel: Recent Labs    11/27/20 0419  CHOL 136  HDL 61  LDLCALC 62  TRIG 65  CHOLHDL 2.2   Thyroid Function Tests: No results for input(s): TSH, T4TOTAL, T3FREE, THYROIDAB in the last 72 hours.  Invalid input(s): FREET3 Anemia Panel: No results for input(s): VITAMINB12, FOLATE, FERRITIN, TIBC, IRON, RETICCTPCT in the last 72 hours.  DG Chest Portable 1 View  Result Date: 11/25/2020 CLINICAL DATA:  Shortness of breath EXAM: PORTABLE CHEST 1 VIEW COMPARISON:  10/04/2020 FINDINGS: Chronic lung disease with honeycombing and traction bronchiectasis by CT in 2020. Worsening opacity which is symmetric. No visible effusion or pneumothorax. Normal heart size. IMPRESSION: Increased pulmonary opacity since October 2021 with symmetry favoring edema, atypical infection, or alveolitis. Pulmonary fibrosis. Electronically Signed   By: Monte Fantasia M.D.   On: 12/08/2020 06:54     Echo Pending  TELEMETRY: sinus rhythm, 85 bpm  ASSESSMENT AND PLAN:  Active Problems:   Acute renal failure superimposed on stage 3a chronic kidney disease (HCC)   Acute hypoxemic respiratory failure (HCC)    1. NSTEMI, in the setting of acute CHF, sepsis, and pneumonia with ECG revealing inferolateral T wave  inversions, and high sensitivity troponin 330->8296->17762. The patient is currently on heparin drip. Echocardiogram pending. Beta blocker deferred in light of chronic hypotension. 2. Paroxysmal atrial fibrillation, on amiodarone 200 mg daily, Eliquis held when heparin started, rate controlled 3. Acute diastolic CHF, presenting with 3+ bilateral lower extremity edema, elevated BNP, and respiratory distress. Chest xray shows edema vs infection 4. Severe sepsis, possibly secondary to pneumonia with leukocytosis and fever, hypotension, and lactic acidosis 5. Chronic hypotension, on midodrine 6. AKI on CKD stage  III, patient had worsening shortness of breath when IV Lasix switched to oral, so he remains on IV Lasix. 7. Pulmonary fibrosis and COPD  Recommendations: 1. Agree with current therapy 2. Review 2D echocardiogram 3. Continue IV Lasix with careful monitoring of renal status and blood pressure 4. Continue oral amiodarone 200 mg once daily for rhythm control 5. Continue heparin drip for now 6. Defer cardiac catheterization at this time in the absence of chest pain in the setting of severe sepsis, pneumonia, AKI on CKD III, advanced age and frailty.  7. Further recommendations pending results of [place], Hershal Coria 11/27/2020 1:38 PM

## 2020-11-28 DIAGNOSIS — J9601 Acute respiratory failure with hypoxia: Secondary | ICD-10-CM

## 2020-11-28 LAB — HEPARIN LEVEL (UNFRACTIONATED): Heparin Unfractionated: 1.58 IU/mL — ABNORMAL HIGH (ref 0.30–0.70)

## 2020-11-28 MED ORDER — LORAZEPAM 2 MG/ML IJ SOLN
1.0000 mg | INTRAMUSCULAR | Status: DC | PRN
  Filled 2020-11-28: qty 1

## 2020-11-28 MED ORDER — MORPHINE SULFATE (PF) 2 MG/ML IV SOLN: 2.0000 mg | Freq: Once | INTRAVENOUS | Status: AC

## 2020-11-28 MED ORDER — LORAZEPAM 2 MG/ML PO CONC: 1.0000 mg | ORAL | Status: DC | PRN

## 2020-11-28 MED ORDER — LORAZEPAM 1 MG PO TABS: 1.0000 mg | ORAL_TABLET | ORAL | Status: DC | PRN

## 2020-11-28 MED ORDER — IPRATROPIUM-ALBUTEROL 0.5-2.5 (3) MG/3ML IN SOLN: 3.0000 mL | Freq: Four times a day (QID) | RESPIRATORY_TRACT | Status: DC

## 2020-11-28 MED ORDER — GLYCOPYRROLATE 0.2 MG/ML IJ SOLN: 0.2000 mg | INTRAMUSCULAR | Status: DC | PRN

## 2020-11-28 MED ORDER — MORPHINE SULFATE (PF) 2 MG/ML IV SOLN
1.0000 mg | INTRAVENOUS | Status: DC | PRN
Start: 2020-11-28 — End: 2020-11-28
  Administered 2020-11-28: 1 mg via INTRAVENOUS
  Filled 2020-11-28: qty 1

## 2020-11-28 MED ORDER — HEPARIN (PORCINE) 25000 UT/250ML-% IV SOLN: 1300.0000 [IU]/h | INTRAVENOUS | Status: DC

## 2020-11-28 MED ORDER — LORAZEPAM 2 MG/ML IJ SOLN
0.5000 mg | Freq: Once | INTRAMUSCULAR | Status: AC
  Administered 2020-11-28: 02:00:00 0.5 mg via INTRAVENOUS
  Filled 2020-11-28: qty 1

## 2020-11-28 MED ORDER — HYDROXYZINE HCL 25 MG PO TABS
25.0000 mg | ORAL_TABLET | Freq: Three times a day (TID) | ORAL | Status: DC | PRN
  Filled 2020-11-28 (×2): qty 1

## 2020-11-28 MED ORDER — MORPHINE SULFATE (PF) 2 MG/ML IV SOLN
INTRAVENOUS | Status: AC
  Administered 2020-11-28: 05:00:00 2 mg via INTRAVENOUS
  Filled 2020-11-28: qty 1

## 2020-11-28 MED ORDER — GLYCOPYRROLATE 1 MG PO TABS
1.0000 mg | ORAL_TABLET | ORAL | Status: DC | PRN
  Filled 2020-11-28: qty 1

## 2020-11-28 MED ORDER — LORAZEPAM 2 MG/ML IJ SOLN
1.0000 mg | INTRAMUSCULAR | Status: DC | PRN
  Administered 2020-11-28: 05:00:00 1 mg via INTRAVENOUS

## 2020-11-28 MED ORDER — GLYCOPYRROLATE 0.2 MG/ML IJ SOLN
0.2000 mg | INTRAMUSCULAR | Status: DC | PRN
  Filled 2020-11-28: qty 1

## 2020-12-01 LAB — CULTURE, BLOOD (ROUTINE X 2)
Culture: NO GROWTH
Culture: NO GROWTH

## 2020-12-08 ENCOUNTER — Ambulatory Visit: Payer: Medicare Other | Admitting: Family

## 2020-12-19 NOTE — Progress Notes (Signed)
Pt placed on 2L  per verbal order from NP [place] in efforts to transition to [place].

## 2020-12-19 NOTE — Progress Notes (Signed)
Patient transitioned to comfort care, patient's request. Family at bedside. Patient made comfortable with ordered medications and taken off bipap and put on nasal cannula. Patients TOD 0451. No audible heart tones or papable pulse. Confirmed with Madelon Lips, RN. Physician notified.

## 2020-12-19 NOTE — Death Summary Note (Signed)
DEATH SUMMARY   Patient Details  Name: Wesley Newton MRN: [id number] DOB: [birth year]  Admission/Discharge Information   Admit Date:  12/12/2020  Date of Death: Date of Death: December 14, 2020  Time of Death: Time of Death: 0453  Length of Stay: 2  Referring Physician: Baxter Hire, MD   Reason(s) for Hospitalization  Acute hypoxic respiratory failure  Diagnoses  Preliminary cause of death:  Secondary Diagnoses (including complications and co-morbidities):  Acute on chronic diastolic congestive heart failure NSTEMI Acute renal failure on chronic kidney disease stage IIIa Severe sepsis with pneumonia  Brief Hospital Course (including significant findings, care, treatment, and services provided and events leading to death)  Wesley Newton is a 87 y.o. year old male who admitted to the stepdown unit in the hospital on 12/12/20.  He was placed on BiPAP for acute hypoxic respiratory failure.  Patient was given Lasix 40 mg IV twice a day for acute diastolic congestive heart failure.  The patient's second cardiac enzyme went up to 8296 and third cardiac enzyme up at 17,762.  The patient was diagnosed with NSTEMI given aspirin heparin drip and blood pressure too low the initial day for any beta-blocker or any other medications.  The patient was empirically put on steroids because of his pulmonary fibrosis and antibiotic for likely pneumonia and fever of 101.  The patient on 11/27/2020 was tapered over to regular nasal cannula but quickly worsened requiring heated high flow nasal cannula up to 65% oxygen.  Lasix was switched back to IV. The patient's creatinine did go up from 1.30 to 1.68.  The patient was seen by palliative care team.  Overnight and into the morning on 12/14/20 the patient had worsening symptoms required to go back on the BiPAP.  He was converted over to comfort care at that time.  Patient passed away at 4:53 AM on 12/14/2020.    Pertinent Labs and Studies  Significant  Diagnostic Studies DG Chest Portable 1 View  Result Date: 12-12-2020 CLINICAL DATA:  Shortness of breath EXAM: PORTABLE CHEST 1 VIEW COMPARISON:  10/04/2020 FINDINGS: Chronic lung disease with honeycombing and traction bronchiectasis by CT in 03-26-2019. Worsening opacity which is symmetric. No visible effusion or pneumothorax. Normal heart size. IMPRESSION: Increased pulmonary opacity since October 2021 with symmetry favoring edema, atypical infection, or alveolitis. Pulmonary fibrosis. Electronically Signed   By: Monte Fantasia M.D.   On: 2020/12/12 06:54    Microbiology Recent Results (from the past 240 hour(s))  Blood culture (routine x 2)     Status: None (Preliminary result)   Collection Time: 12-Dec-2020  6:29 AM   Specimen: BLOOD  Result Value Ref Range Status   Specimen Description BLOOD RIGHT FA  Final   Special Requests   Final    BOTTLES DRAWN AEROBIC AND ANAEROBIC Blood Culture results may not be optimal due to an excessive volume of blood received in culture bottles   Culture   Final    NO GROWTH 2 DAYS Performed at [facility], [address], [place], [place] [address]    Report Status PENDING  Incomplete  Blood culture (routine x 2)     Status: None (Preliminary result)   Collection Time: 12-12-20  6:37 AM   Specimen: BLOOD  Result Value Ref Range Status   Specimen Description BLOOD LEFT FA  Final   Special Requests   Final    BOTTLES DRAWN AEROBIC AND ANAEROBIC Blood Culture results may not be optimal due to an inadequate volume of  blood received in culture bottles   Culture   Final    NO GROWTH 2 DAYS Performed at [facility], [address] [place], [place] [address]    Report Status PENDING  Incomplete  Resp Panel by RT-PCR (Flu A&B, Covid) Nasopharyngeal Swab     Status: None   Collection Time: 12/05/2020  6:37 AM   Specimen: Nasopharyngeal Swab; Nasopharyngeal(NP) swabs in vial transport medium  Result Value Ref Range Status   SARS Coronavirus 2  by RT PCR NEGATIVE NEGATIVE Final    Comment: (NOTE) SARS-CoV-2 target nucleic acids are NOT DETECTED.  The SARS-CoV-2 RNA is generally detectable in upper respiratory specimens during the acute phase of infection. The lowest concentration of SARS-CoV-2 viral copies this assay can detect is 138 copies/mL. A negative result does not preclude SARS-Cov-2 infection and should not be used as the sole basis for treatment or other patient management decisions. A negative result may occur with  improper specimen collection/handling, submission of specimen other than nasopharyngeal swab, presence of viral mutation(s) within the areas targeted by this assay, and inadequate number of viral copies(<138 copies/mL). A negative result must be combined with clinical observations, patient history, and epidemiological information. The expected result is Negative.  Fact Sheet for Patients:  EntrepreneurPulse.com.au  Fact Sheet for Healthcare Providers:  IncredibleEmployment.be  This test is no t yet approved or cleared by the [place] FDA and  has been authorized for detection and/or diagnosis of SARS-CoV-2 by FDA under an Emergency Use Authorization (EUA). This EUA will remain  in effect (meaning this test can be used) for the duration of the COVID-19 declaration under Section 564(b)(1) of the Act, 21 U.S.C.section 360bbb-3(b)(1), unless the authorization is terminated  or revoked sooner.       Influenza A by PCR NEGATIVE NEGATIVE Final   Influenza B by PCR NEGATIVE NEGATIVE Final    Comment: (NOTE) The Xpert Xpress SARS-CoV-2/FLU/RSV plus assay is intended as an aid in the diagnosis of influenza from Nasopharyngeal swab specimens and should not be used as a sole basis for treatment. Nasal washings and aspirates are unacceptable for Xpert Xpress SARS-CoV-2/FLU/RSV testing.  Fact Sheet for Patients: EntrepreneurPulse.com.au  Fact  Sheet for Healthcare Providers: IncredibleEmployment.be  This test is not yet approved or cleared by the [place] FDA and has been authorized for detection and/or diagnosis of SARS-CoV-2 by FDA under an Emergency Use Authorization (EUA). This EUA will remain in effect (meaning this test can be used) for the duration of the COVID-19 declaration under Section 564(b)(1) of the Act, 21 U.S.C. section 360bbb-3(b)(1), unless the authorization is terminated or revoked.  Performed at [facility], [address] [place], [place] [address]   MRSA PCR Screening     Status: None   Collection Time: 11/30/2020  1:48 PM   Specimen: Nasopharyngeal  Result Value Ref Range Status   MRSA by PCR NEGATIVE NEGATIVE Final    Comment:        The GeneXpert MRSA Assay (FDA approved for NASAL specimens only), is one component of a comprehensive MRSA colonization surveillance program. It is not intended to diagnose MRSA infection nor to guide or monitor treatment for MRSA infections. Performed at [facility], [address] [place], [place] [address]     Lab Basic Metabolic Panel: Recent Labs  Lab 12/10/2020 0629 11/27/20 0419  NA 140 140  K 4.0 4.1  CL 102 101  CO2 27 28  GLUCOSE 124* 143*  BUN 14 24*  CREATININE 1.30* 1.68*  CALCIUM 8.7* 8.4*   Liver Function Tests: Recent Labs  Lab 12/02/2020 0629  AST 27  ALT 19  ALKPHOS 53  BILITOT 1.2  PROT 6.5  ALBUMIN 2.8*   CBC: Recent Labs  Lab 11/22/2020 0629 11/27/20 0419  WBC 16.6* 18.6*  NEUTROABS 13.8*  --   HGB 11.4* 10.5*  HCT 36.7* 32.7*  MCV 91.1 89.8  PLT 290 269   Sepsis Labs: Recent Labs  Lab 11/19/2020 0629 12/04/2020 0836 11/27/20 0419  PROCALCITON 0.11  --  4.47  WBC 16.6*  --  18.6*  LATICACIDVEN 3.6* 2.8*  --     Procedures/Operations  None   Loletha Grayer 12-18-20, 12:11 PM

## 2020-12-19 NOTE — Progress Notes (Signed)
ANTICOAGULATION CONSULT NOTE  Pharmacy Consult for Heparin Drip Indication: chest pain/ACS  Patient Measurements: Heparin Dosing Weight: 73.1 kg  Labs: Recent Labs    11/21/2020 0629 11/19/2020 0836 11/27/2020 0948 12/10/2020 0948 11/25/2020 1755 11/27/20 0419 11/27/20 1253 11/27/20 2313  HGB 11.4*  --   --   --   --  10.5*  --   --   HCT 36.7*  --   --   --   --  32.7*  --   --   PLT 290  --   --   --   --  269  --   --   APTT  --   --  36   < > 64* 77* 52* 65*  HEPARINUNFRC  --   --  3.60*  --  3.32*  --   --   --   CREATININE 1.30*  --   --   --   --  1.68*  --   --   TROPONINIHS 330* 8,296*  --   --   --  93,810*  --   --    < > = values in this interval not displayed.    Estimated Creatinine Clearance: 25.8 mL/min (A) (by C-G formula based on SCr of 1.68 mg/dL (H)).  Assessment: 87 y.o. male presenting in respiratory distress and found to have NSTEMI. Pharmacy has been consulted for IV heparin dosing. Patient takes apixaban 5mg  prior to admission, last dose 12/8 evening.  CBC monitoring: --Hgb 11.4 >> 10.5 --Plt 290 >> 269  Heparin course: --12/9 at 1040 heparin started at 800 units/hr --12/9 at 1755 aPTT 64s, heparin 1000 unit bolus x 1 and rate increased to 950 units/hr --12/10 at 0419 aPTT 77s, heparin continued at 950 units/hr --12/10 at 1253 aPTT 52s, heparin 2000 unit bolus x 1 and rate increased to 1150 units/hr --12/10 at 2313 aPTT 65s, increase rate to 1300 units/hr  Goal of Therapy:  Heparin level 0.3-0.7 units/ml aPTT 66 - 102 seconds Monitor platelets by anticoagulation protocol: Yes   Plan:  --Will increase heparin infusion to 1300 units/hr --Re-check aPTT 8 hours after rate change --Continue to monitor CBC daily per protocol while on heparin infusion  Renda Rolls, PharmD, [facility] 12/07/2020 1:15 AM

## 2020-12-19 NOTE — Progress Notes (Addendum)
Notified by patient's RN that family at the bedside and wanted to discuss goals of care and possible transition to comfort care. Per family at the bedside, patient stated he would not like any life prolonging measures including supplemental oxygen. He is currently on BiPAP and c/o discomfort.  Family agree that the BiPAP machine is uncomfortable and prolonging his suffering. They all agreed that he is ready to be transitioned to comfort care. He was made comfort care at 0315 and he passed away peacefully at 50. Family at the bedside.     Rufina Falco, DNP, CCRN, FNP-C [facility] Nurse Practitioner Between 7pm to [place] - Pager [phone] Actively using [facility] secure chat messaging

## 2020-12-19 DEATH — deceased

## 2020-12-24 ENCOUNTER — Ambulatory Visit: Payer: Medicare Other | Admitting: Urology
